# Patient Record
Sex: Male | Born: 1935 | Race: White | Hispanic: No | Smoking: Former smoker
Health system: Southern US, Community
[De-identification: ages and names within clinical notes are randomized; demographics above are authoritative.]

## PROBLEM LIST (undated history)

## (undated) DIAGNOSIS — C801 Malignant (primary) neoplasm, unspecified: Secondary | ICD-10-CM

## (undated) DIAGNOSIS — K635 Polyp of colon: Secondary | ICD-10-CM

## (undated) DIAGNOSIS — D126 Benign neoplasm of colon, unspecified: Secondary | ICD-10-CM

## (undated) DIAGNOSIS — R011 Cardiac murmur, unspecified: Secondary | ICD-10-CM

## (undated) DIAGNOSIS — T7840XA Allergy, unspecified, initial encounter: Secondary | ICD-10-CM

## (undated) DIAGNOSIS — F528 Other sexual dysfunction not due to a substance or known physiological condition: Secondary | ICD-10-CM

## (undated) DIAGNOSIS — G4730 Sleep apnea, unspecified: Secondary | ICD-10-CM

## (undated) DIAGNOSIS — E785 Hyperlipidemia, unspecified: Secondary | ICD-10-CM

## (undated) DIAGNOSIS — L719 Rosacea, unspecified: Secondary | ICD-10-CM

## (undated) DIAGNOSIS — I4892 Unspecified atrial flutter: Secondary | ICD-10-CM

## (undated) DIAGNOSIS — M1990 Unspecified osteoarthritis, unspecified site: Secondary | ICD-10-CM

## (undated) DIAGNOSIS — J309 Allergic rhinitis, unspecified: Secondary | ICD-10-CM

## (undated) DIAGNOSIS — I10 Essential (primary) hypertension: Secondary | ICD-10-CM

## (undated) HISTORY — DX: Cardiac murmur, unspecified: R01.1

## (undated) HISTORY — DX: Rosacea, unspecified: L71.9

## (undated) HISTORY — DX: Essential (primary) hypertension: I10

## (undated) HISTORY — DX: Allergy, unspecified, initial encounter: T78.40XA

## (undated) HISTORY — PX: COLONOSCOPY: SHX174

## (undated) HISTORY — DX: Polyp of colon: K63.5

## (undated) HISTORY — DX: Hyperlipidemia, unspecified: E78.5

## (undated) HISTORY — PX: PALATE SURGERY: SHX729

## (undated) HISTORY — PX: ADENOIDECTOMY: SUR15

## (undated) HISTORY — DX: Other sexual dysfunction not due to a substance or known physiological condition: F52.8

## (undated) HISTORY — DX: Benign neoplasm of colon, unspecified: D12.6

## (undated) HISTORY — PX: TONSILLECTOMY: SHX5217

## (undated) HISTORY — DX: Allergic rhinitis, unspecified: J30.9

## (undated) HISTORY — DX: Unspecified atrial flutter: I48.92

---

## 2000-06-01 DIAGNOSIS — D126 Benign neoplasm of colon, unspecified: Secondary | ICD-10-CM

## 2000-06-01 HISTORY — DX: Benign neoplasm of colon, unspecified: D12.6

## 2000-06-15 ENCOUNTER — Encounter (INDEPENDENT_AMBULATORY_CARE_PROVIDER_SITE_OTHER): Payer: Self-pay | Admitting: Specialist

## 2000-06-15 ENCOUNTER — Other Ambulatory Visit: Admission: RE | Admit: 2000-06-15 | Discharge: 2000-06-15 | Payer: Self-pay | Admitting: Gastroenterology

## 2004-05-19 ENCOUNTER — Ambulatory Visit: Payer: Self-pay | Admitting: Family Medicine

## 2004-08-11 ENCOUNTER — Ambulatory Visit: Payer: Self-pay | Admitting: Family Medicine

## 2004-08-19 ENCOUNTER — Ambulatory Visit: Payer: Self-pay | Admitting: Family Medicine

## 2005-06-01 ENCOUNTER — Ambulatory Visit: Payer: Self-pay | Admitting: Gastroenterology

## 2005-06-06 ENCOUNTER — Ambulatory Visit: Payer: Self-pay | Admitting: Cardiology

## 2005-06-12 ENCOUNTER — Ambulatory Visit: Payer: Self-pay | Admitting: Family Medicine

## 2005-06-15 ENCOUNTER — Ambulatory Visit: Payer: Self-pay | Admitting: Gastroenterology

## 2005-06-15 ENCOUNTER — Encounter (INDEPENDENT_AMBULATORY_CARE_PROVIDER_SITE_OTHER): Payer: Self-pay | Admitting: Specialist

## 2005-06-15 LAB — HM COLONOSCOPY

## 2005-08-21 ENCOUNTER — Ambulatory Visit: Payer: Self-pay | Admitting: Family Medicine

## 2005-08-28 ENCOUNTER — Ambulatory Visit: Payer: Self-pay | Admitting: Family Medicine

## 2006-04-26 IMAGING — CT CT CHEST W/O CM
2 of 5 series · 12 of 36 positions shown, 15 images · IV contrast (agent unspecified)
Comparison: none

CLINICAL DATA: Cough.  Quit smoking many years ago. 
 CHEST CT WITHOUT CONTRAST:
TECHNIQUE: Multidetector CT imaging of the chest was performed following the standard protocol without IV contrast.

[Series 2: chest_hi_res 5.0 b40f st · axial · 0.82mm/px · z∈[-356,-86]mm · 9 of 68 slices shown, 12 images]
[im 7/68  mediastinal]
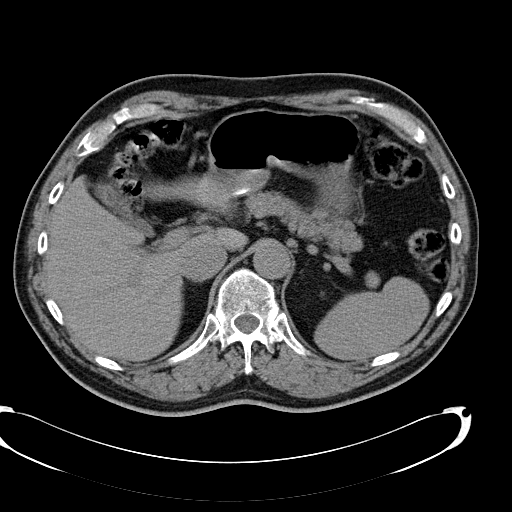
[im 7/68  lung]
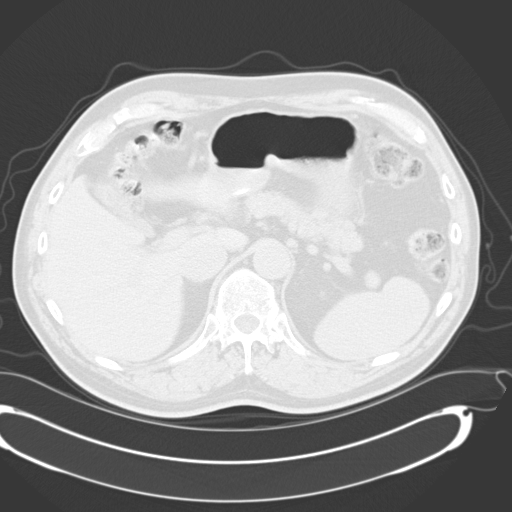
[im 14/68  lung]
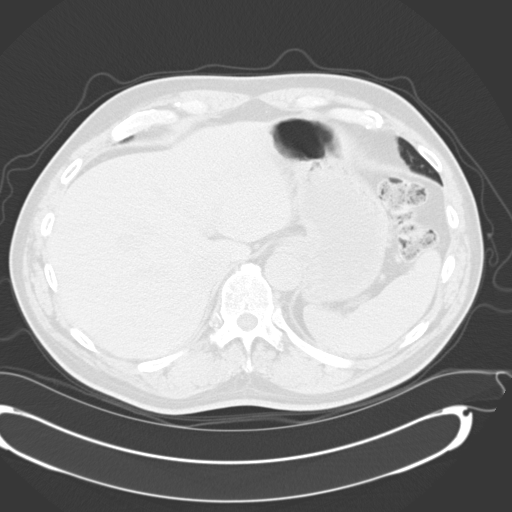
[im 21/68  lung]
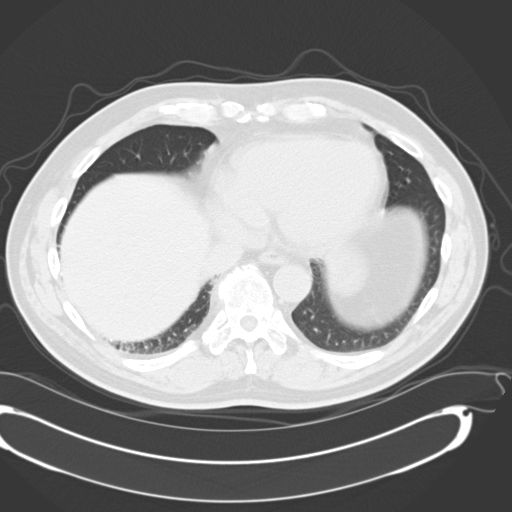
[im 27/68  lung]
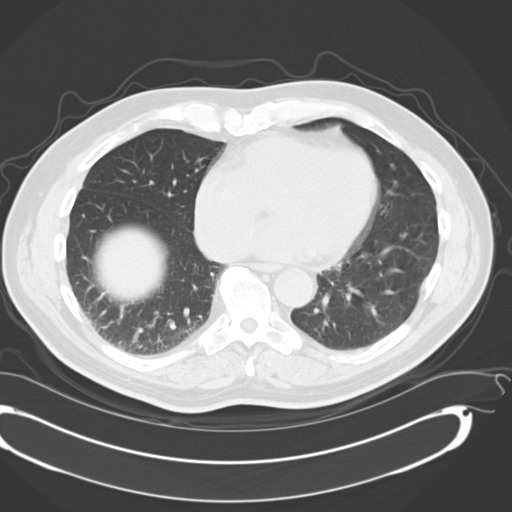
[im 34/68  mediastinal]
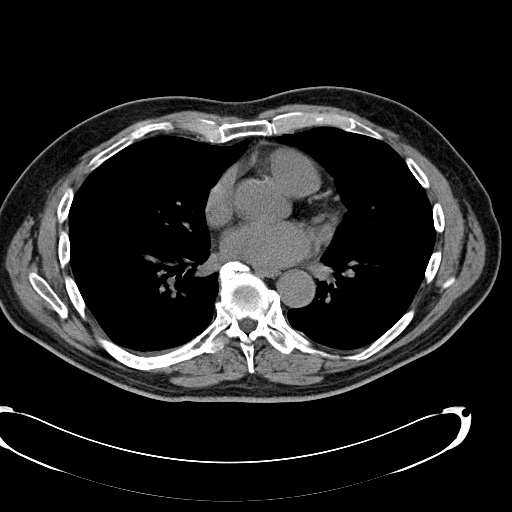
[im 34/68  lung]
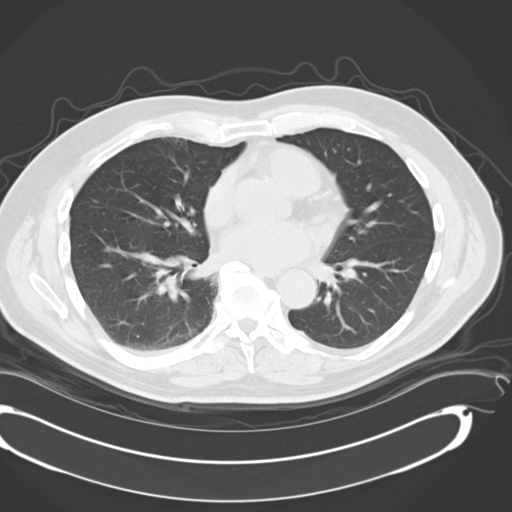
[im 41/68  lung]
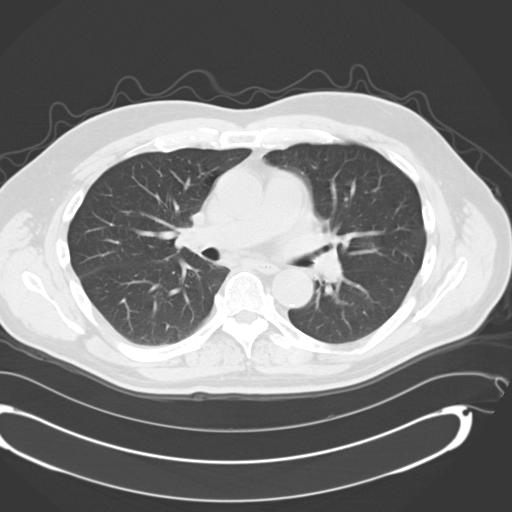
[im 47/68  lung]
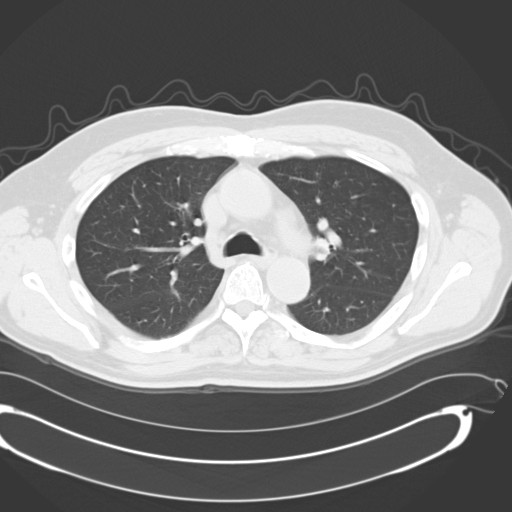
[im 54/68  lung]
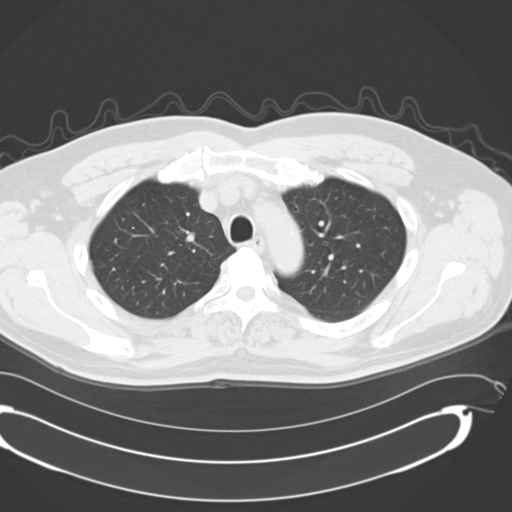
[im 61/68  mediastinal]
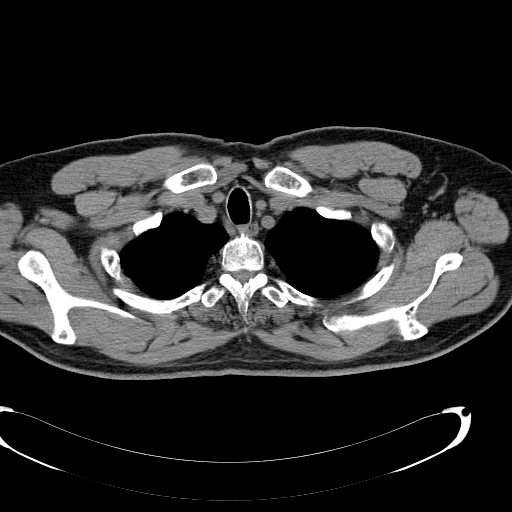
[im 61/68  lung]
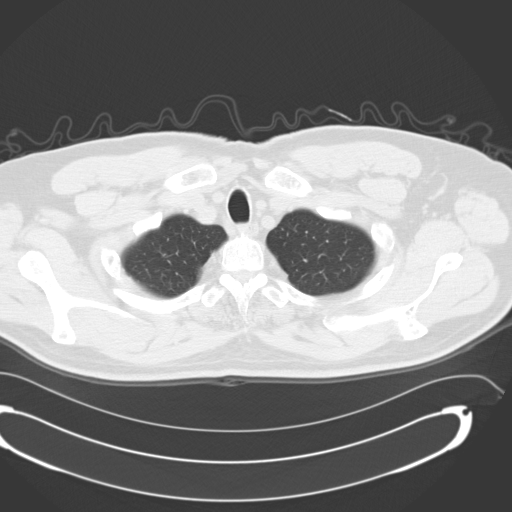

[Series 602: <mpr range> · coronal · 0.82mm/px · 3 of 39 slices shown]
[im 8/39  lung]
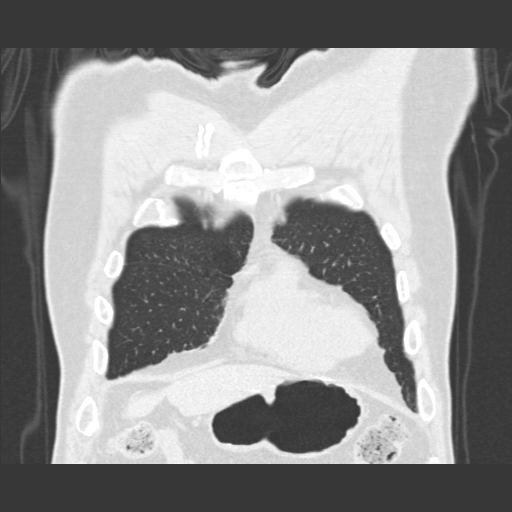
[im 16/39  lung]
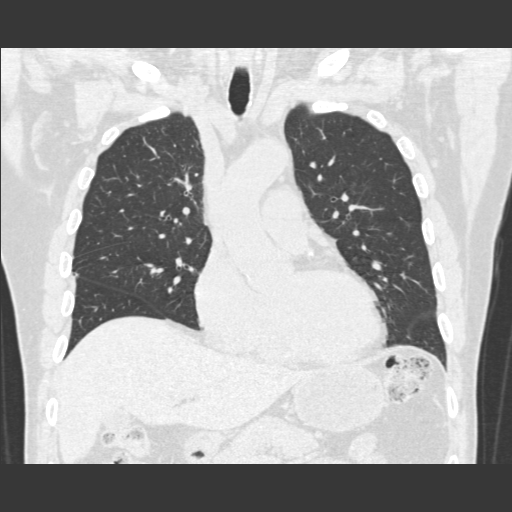
[im 23/39  lung]
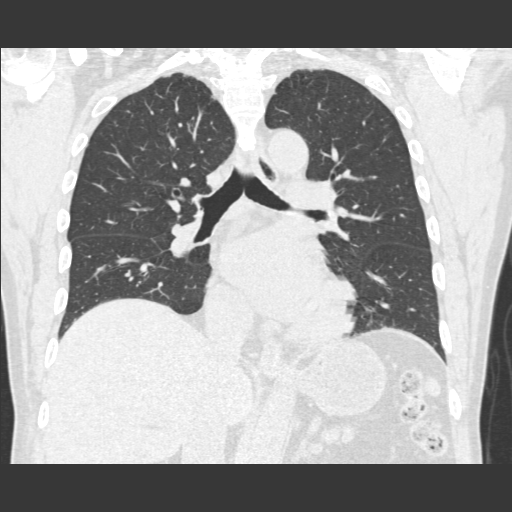

[12 of 36 positions shown; findings below may reference images not displayed]

FINDINGS: Multidetector helical scans through the chest were performed in the unenhanced state.  No lung parenchymal lesion is seen.  No effusion is noted.  There is no evidence of mediastinal or hilar adenopathy with only a few small nonenlarged mediastinal nodes present.  The pulmonary arteries and thoracic aorta are normal in the unenhanced state.  The heart is mildly enlarged.
IMPRESSION: 1.  Negative CT of the chest.  No lung nodule or adenopathy is seen. 
 2.  Minimal calcification posteriorly at the right lung base of questionable significance.

## 2006-09-04 ENCOUNTER — Ambulatory Visit: Payer: Self-pay | Admitting: Family Medicine

## 2006-09-04 LAB — CONVERTED CEMR LAB
ALT: 45 units/L — ABNORMAL HIGH (ref 0–40)
AST: 28 units/L (ref 0–37)
Albumin: 3.7 g/dL (ref 3.5–5.2)
BUN: 18 mg/dL (ref 6–23)
Creatinine, Ser: 0.9 mg/dL (ref 0.4–1.5)
Eosinophils Absolute: 0.3 10*3/uL (ref 0.0–0.6)
GFR calc non Af Amer: 89 mL/min
HCT: 46.8 % (ref 39.0–52.0)
HDL: 46.3 mg/dL (ref >39.0)
Hemoglobin: 16.2 g/dL (ref 13.0–17.0)
Hgb A1c MFr Bld: 5.5 % (ref 4.6–6.0)
LDL Cholesterol: 130 mg/dL — ABNORMAL HIGH (ref 0–99)
Lymphocytes Relative: 19.7 % (ref 12.0–46.0)
MCHC: 34.6 g/dL (ref 30.0–36.0)
Neutro Abs: 3.7 10*3/uL (ref 1.4–7.7)
Neutrophils Relative %: 61.1 % (ref 43.0–77.0)
PSA: 0.59 ng/mL (ref 0.10–4.00)
RBC: 4.97 M/uL (ref 4.22–5.81)
Sodium: 143 meq/L (ref 135–145)
Total CHOL/HDL Ratio: 4.2
Total Protein: 6.9 g/dL (ref 6.0–8.3)
Triglycerides: 99 mg/dL (ref 0–149)
VLDL: 20 mg/dL (ref 0–40)
WBC: 6.1 10*3/uL (ref 4.5–10.5)

## 2006-09-11 ENCOUNTER — Ambulatory Visit: Payer: Self-pay | Admitting: Family Medicine

## 2007-02-11 ENCOUNTER — Telehealth: Payer: Self-pay | Admitting: Family Medicine

## 2007-02-12 ENCOUNTER — Ambulatory Visit (HOSPITAL_BASED_OUTPATIENT_CLINIC_OR_DEPARTMENT_OTHER): Admission: RE | Admit: 2007-02-12 | Discharge: 2007-02-12 | Payer: Self-pay | Admitting: Otolaryngology

## 2007-09-18 ENCOUNTER — Ambulatory Visit: Payer: Self-pay | Admitting: Family Medicine

## 2007-09-18 LAB — CONVERTED CEMR LAB
ALT: 21 units/L (ref 0–53)
Albumin: 3.7 g/dL (ref 3.5–5.2)
Alkaline Phosphatase: 60 units/L (ref 39–117)
Basophils Relative: 0.4 % (ref 0.0–1.0)
Bilirubin, Direct: 0.1 mg/dL (ref 0.0–0.3)
Calcium: 9 mg/dL (ref 8.4–10.5)
GFR calc Af Amer: 123 mL/min
Hemoglobin: 15.3 g/dL (ref 13.0–17.0)
Ketones, urine, test strip: NEGATIVE
MCHC: 33.6 g/dL (ref 30.0–36.0)
MCV: 95.4 fL (ref 78.0–100.0)
Neutro Abs: 3 10*3/uL (ref 1.4–7.7)
PSA: 0.82 ng/mL (ref 0.10–4.00)
Potassium: 4.7 meq/L (ref 3.5–5.1)
RBC: 4.79 M/uL (ref 4.22–5.81)
Sodium: 143 meq/L (ref 135–145)
Total CHOL/HDL Ratio: 4.5
Urobilinogen, UA: 0.2
WBC Urine, dipstick: NEGATIVE

## 2007-10-03 ENCOUNTER — Ambulatory Visit: Payer: Self-pay | Admitting: Family Medicine

## 2007-10-03 DIAGNOSIS — F528 Other sexual dysfunction not due to a substance or known physiological condition: Secondary | ICD-10-CM | POA: Insufficient documentation

## 2007-10-03 DIAGNOSIS — E7800 Pure hypercholesterolemia, unspecified: Secondary | ICD-10-CM | POA: Insufficient documentation

## 2007-10-03 DIAGNOSIS — J309 Allergic rhinitis, unspecified: Secondary | ICD-10-CM | POA: Insufficient documentation

## 2007-10-03 DIAGNOSIS — E785 Hyperlipidemia, unspecified: Secondary | ICD-10-CM

## 2007-10-03 DIAGNOSIS — R011 Cardiac murmur, unspecified: Secondary | ICD-10-CM | POA: Insufficient documentation

## 2007-10-03 HISTORY — DX: Cardiac murmur, unspecified: R01.1

## 2007-10-03 HISTORY — DX: Hyperlipidemia, unspecified: E78.5

## 2007-10-03 HISTORY — DX: Allergic rhinitis, unspecified: J30.9

## 2007-10-03 HISTORY — DX: Other sexual dysfunction not due to a substance or known physiological condition: F52.8

## 2007-10-14 ENCOUNTER — Ambulatory Visit: Payer: Self-pay

## 2007-10-14 ENCOUNTER — Encounter: Payer: Self-pay | Admitting: Family Medicine

## 2007-10-23 ENCOUNTER — Ambulatory Visit: Payer: Self-pay | Admitting: Family Medicine

## 2008-10-08 ENCOUNTER — Ambulatory Visit: Payer: Self-pay | Admitting: Family Medicine

## 2009-03-15 ENCOUNTER — Telehealth: Payer: Self-pay | Admitting: Family Medicine

## 2009-05-03 ENCOUNTER — Ambulatory Visit: Payer: Self-pay | Admitting: Family Medicine

## 2009-05-03 DIAGNOSIS — R002 Palpitations: Secondary | ICD-10-CM | POA: Insufficient documentation

## 2009-05-05 ENCOUNTER — Telehealth: Payer: Self-pay | Admitting: Family Medicine

## 2009-08-25 ENCOUNTER — Ambulatory Visit: Payer: Self-pay | Admitting: Family Medicine

## 2009-08-25 DIAGNOSIS — J45901 Unspecified asthma with (acute) exacerbation: Secondary | ICD-10-CM | POA: Insufficient documentation

## 2009-10-19 ENCOUNTER — Ambulatory Visit: Payer: Self-pay | Admitting: Family Medicine

## 2009-10-19 DIAGNOSIS — R6882 Decreased libido: Secondary | ICD-10-CM | POA: Insufficient documentation

## 2010-05-11 ENCOUNTER — Telehealth: Payer: Self-pay | Admitting: Family Medicine

## 2010-05-29 LAB — CONVERTED CEMR LAB
ALT: 25 units/L (ref 0–53)
ALT: 34 units/L (ref 0–53)
Albumin: 3.9 g/dL (ref 3.5–5.2)
Alkaline Phosphatase: 56 units/L (ref 39–117)
BUN: 18 mg/dL (ref 6–23)
Basophils Relative: 0.5 % (ref 0.0–3.0)
Bilirubin, Direct: 0.2 mg/dL (ref 0.0–0.3)
Calcium: 9 mg/dL (ref 8.4–10.5)
Cholesterol: 196 mg/dL (ref 0–200)
Cholesterol: 212 mg/dL — ABNORMAL HIGH (ref 0–200)
Creatinine, Ser: 0.9 mg/dL (ref 0.4–1.5)
Creatinine, Ser: 0.9 mg/dL (ref 0.4–1.5)
Direct LDL: 148.7 mg/dL
Eosinophils Relative: 4.4 % (ref 0.0–5.0)
GFR calc non Af Amer: 88.52 mL/min (ref >60)
GFR calc non Af Amer: 93.02 mL/min (ref >60)
Glucose, Bld: 111 mg/dL — ABNORMAL HIGH (ref 70–99)
Glucose, Urine, Semiquant: NEGATIVE
HCT: 43.7 % (ref 39.0–52.0)
HDL: 52.1 mg/dL (ref >39.00)
Hemoglobin: 15.6 g/dL (ref 13.0–17.0)
Ketones, urine, test strip: NEGATIVE
Lymphs Abs: 1 10*3/uL (ref 0.7–4.0)
Lymphs Abs: 1.3 10*3/uL (ref 0.7–4.0)
MCHC: 34.5 g/dL (ref 30.0–36.0)
MCV: 95.4 fL (ref 78.0–100.0)
MCV: 95.8 fL (ref 78.0–100.0)
Monocytes Absolute: 0.6 10*3/uL (ref 0.1–1.0)
Monocytes Absolute: 0.6 10*3/uL (ref 0.1–1.0)
Neutro Abs: 2.6 10*3/uL (ref 1.4–7.7)
Nitrite: NEGATIVE
PSA: 0.71 ng/mL (ref 0.10–4.00)
RDW: 14.2 % (ref 11.5–14.6)
Sodium: 142 meq/L (ref 135–145)
Specific Gravity, Urine: 1.02
Specific Gravity, Urine: 1.025
TSH: 1.57 microintl units/mL (ref 0.35–5.50)
Total Bilirubin: 1 mg/dL (ref 0.3–1.2)
Total CHOL/HDL Ratio: 4
Total Protein: 6.8 g/dL (ref 6.0–8.3)
Total Protein: 7.1 g/dL (ref 6.0–8.3)
Triglycerides: 118 mg/dL (ref 0.0–149.0)
Urobilinogen, UA: 0.2
VLDL: 13.4 mg/dL (ref 0.0–40.0)
WBC Urine, dipstick: NEGATIVE
pH: 7

## 2010-05-31 NOTE — Assessment & Plan Note (Signed)
Summary: emp---will fast//ccm   Vital Signs:  Patient profile:   75 year old male Height:      75.25 inches Weight:      240 pounds Temp:     98.2 degrees F oral BP sitting:   120 / 74  (left arm) Cuff size:   regular  Vitals Entered By: Kern Reap CMA Duncan Dull) (October 19, 2009 9:34 AM) CC: cpx   CC:  cpx.  History of Present Illness: Gary [name] is a 75 year old, married male, nonsmoker, who comes in today for evaluation of hyperlipidemia, allergic rhinitis erectile dysfunction, and a new problem of decreased libido.  His hyperlipidemia is treated with simvastatin 40 mg daily will check lipid panel today.  Erectile dysfunction is treated with Viagra 100 mg p.r.n.  Allergic rhinitis is treated with Nasonex nasal spray p.r.n.  He sells the nose recently decreased libido.  No change in his anatomy.  He gets routine eye care.  Dental care.  Colonoscopy initial one showed a polyp x 2.  Subsequent colonoscopy normal.  Tetanus 2006 Pneumovax 2006  Allergies: No Known Drug Allergies  Past History:  Past medical, surgical, family and social histories (including risk factors) reviewed, and no changes noted (except as noted below).  Past Medical History: Reviewed history from 10/23/2007 and no changes required. Allergic rhinitis Hyperlipidemia acne rosacea pilonidal cyst tonsillectomy aortic insufficiency  Family History: Reviewed history from 10/03/2007 and no changes required. father died of liver cancer.  He previously had an MRI and was an alcoholic  mother died at 66 old age.  Diabetes, type IIone brother one sister in good health  Social History: Reviewed history from 10/03/2007 and no changes required. Retired Married Former Smoker Alcohol use-yes Drug use-no Regular exercise-yes  Review of Systems      See HPI  Physical Exam  General:  Well-developed,well-nourished,in no acute distress; alert,appropriate and cooperative throughout examination Head:   Normocephalic and atraumatic without obvious abnormalities. No apparent alopecia or balding. Eyes:  No corneal or conjunctival inflammation noted. EOMI. Perrla. Funduscopic exam benign, without hemorrhages, exudates or papilledema. Vision grossly normal. Ears:  External ear exam shows no significant lesions or deformities.  Otoscopic examination reveals clear canals, tympanic membranes are intact bilaterally without bulging, retraction, inflammation or discharge. Hearing is grossly normal bilaterally. Nose:  External nasal examination shows no deformity or inflammation. Nasal mucosa are pink and moist without lesions or exudates. Mouth:  Oral mucosa and oropharynx without lesions or exudates.  Teeth in good repair. Neck:  No deformities, masses, or tenderness noted. Chest Wall:  No deformities, masses, tenderness or gynecomastia noted. Breasts:  No masses or gynecomastia noted Lungs:  Normal respiratory effort, chest expands symmetrically. Lungs are clear to auscultation, no crackles or wheezes. Heart:  Normal rate and regular rhythm. S1 and S2 normal without gallop, murmur, click, rub or other extra sounds. Abdomen:  Bowel sounds positive,abdomen soft and non-tender without masses, organomegaly or hernias noted. Rectal:  No external abnormalities noted. Normal sphincter tone. No rectal masses or tenderness. Genitalia:  Testes bilaterally descended without nodularity, tenderness or masses. No scrotal masses or lesions. No penis lesions or urethral discharge. Prostate:  Prostate gland firm and smooth, no enlargement, nodularity, tenderness, mass, asymmetry or induration. Msk:  No deformity or scoliosis noted of thoracic or lumbar spine.   Pulses:  R and L carotid,radial,femoral,dorsalis pedis and posterior tibial pulses are full and equal bilaterally Extremities:  No clubbing, cyanosis, edema, or deformity noted with normal full range of motion of all  joints.   Neurologic:  No cranial nerve deficits  noted. Station and gait are normal. Plantar reflexes are down-going bilaterally. DTRs are symmetrical throughout. Sensory, motor and coordinative functions appear intact. Skin:  Intact without suspicious lesions or rashes Cervical Nodes:  No lymphadenopathy noted Axillary Nodes:  No palpable lymphadenopathy Inguinal Nodes:  No significant adenopathy Psych:  Cognition and judgment appear intact. Alert and cooperative with normal attention span and concentration. No apparent delusions, illusions, hallucinations   Impression & Recommendations:  Problem # 1:  HYPERLIPIDEMIA (ICD-272.4) Assessment Improved  His updated medication list for this problem includes:    Simvastatin 40 Mg Tabs (Simvastatin) .[name]... 1 at bedtime  Orders: Venipuncture ([address]) TLB-Lipid Panel (80061-LIPID) TLB-BMP (Basic Metabolic Panel-BMET) (80048-METABOL) TLB-CBC Platelet - w/Differential (85025-CBCD) TLB-Hepatic/Liver Function Pnl (80076-HEPATIC) TLB-TSH (Thyroid Stimulating Hormone) (84443-TSH) TLB-PSA (Prostate Specific Antigen) (84153-PSA) TLB-Testosterone, Total (84403-TESTO) UA Dipstick w/o Micro (automated)  (81003) EKG w/ Interpretation (93000)  Problem # 2:  CARDIAC MURMUR, AORTIC (ICD-785.2) Assessment: Unchanged  Orders: Venipuncture ([address]) TLB-Lipid Panel (80061-LIPID) TLB-BMP (Basic Metabolic Panel-BMET) (80048-METABOL) TLB-CBC Platelet - w/Differential (85025-CBCD) TLB-Hepatic/Liver Function Pnl (80076-HEPATIC) TLB-TSH (Thyroid Stimulating Hormone) (84443-TSH) TLB-PSA (Prostate Specific Antigen) (84153-PSA) TLB-Testosterone, Total (84403-TESTO) UA Dipstick w/o Micro (automated)  (81003) EKG w/ Interpretation (93000)  Problem # 3:  ALLERGIC RHINITIS (ICD-477.9) Assessment: Improved  His updated medication list for this problem includes:    Nasonex 50 Mcg/act Susp (Mometasone furoate) ..... Spray 2 spray as directed once a day  Problem # 4:  LIBIDO, DECREASED (ICD-799.81) Assessment:  New  Orders: Venipuncture (09811) TLB-Lipid Panel (80061-LIPID) TLB-BMP (Basic Metabolic Panel-BMET) (80048-METABOL) TLB-CBC Platelet - w/Differential (85025-CBCD) TLB-Hepatic/Liver Function Pnl (80076-HEPATIC) TLB-TSH (Thyroid Stimulating Hormone) (84443-TSH) TLB-PSA (Prostate Specific Antigen) (84153-PSA) TLB-Testosterone, Total (84403-TESTO) UA Dipstick w/o Micro (automated)  (81003)  Complete Medication List: 1)  Simvastatin 40 Mg Tabs (Simvastatin) .[name].. 1 at bedtime 2)  Nasonex 50 Mcg/act Susp (Mometasone furoate) .... Spray 2 spray as directed once a day 3)  Adult Aspirin Ec Low Strength 81 Mg Tbec (Aspirin) .... Once daily 4)  Viagra 100 Mg Tabs (Sildenafil citrate) .... Uad 5)  Prednisone 20 Mg Tabs (Prednisone) .... Uad  Other Orders: Pneumococcal Vaccine (91478) Admin 1st Vaccine (29562)  Patient Instructions: 1)  I will call you I get her lab work back 2)  Please schedule a follow-up appointment in 1 year. Prescriptions: VIAGRA 100 MG TABS (SILDENAFIL CITRATE) UAD  #6.0 Each x 10   Entered and Authorized by:   Roderick Pee MD   Signed by:   Roderick Pee MD on 10/19/2009   Method used:   Electronically to        [id number]* (retail)       [address]       [place], [place]  [address]       Ph: [phone]       Fax: [phone]   RxID:   [id number] NASONEX 50 MCG/ACT SUSP (MOMETASONE FUROATE) Spray 2 spray as directed once a day  #3 x 4   Entered and Authorized by:   Roderick Pee MD   Signed by:   Roderick Pee MD on 10/19/2009   Method used:   Electronically to        [facility] #[id number]* (retail)       [address]       [place]  [place], [place]  [address]       Ph: [phone]       Fax: [phone]   RxID:   [id number] SIMVASTATIN 40 MG  TABS (SIMVASTATIN) 1 at bedtime  #100 x 3   Entered and Authorized by:   Roderick Pee MD   Signed by:    Roderick Pee MD on 10/19/2009   Method used:   Electronically to        [id number]* (retail)       [address]       [place], [place]  [address]       Ph: [phone]       Fax: [phone]   RxID:   [id number]    Immunizations Administered:  Pneumonia Vaccine:    Vaccine Type: Pneumovax    Site: left deltoid    Mfr: Merck    Dose: 0.5 ml    Route: IM    Given by: Kern Reap CMA (AAMA)    Exp. Date: 02/23/2011    Lot #: [id number]    Laboratory Results   Urine Tests    Routine Urinalysis   Color: yellow Appearance: Clear Glucose: negative   (Normal Range: Negative) Bilirubin: negative   (Normal Range: Negative) Ketone: negative   (Normal Range: Negative) Spec. Gravity: 1.025   (Normal Range: 1.003-1.035) Blood: negative   (Normal Range: Negative) pH: 7.0   (Normal Range: 5.0-8.0) Protein: 2+   (Normal Range: Negative) Urobilinogen: 0.2   (Normal Range: 0-1) Nitrite: negative   (Normal Range: Negative) Leukocyte Esterace: negative   (Normal Range: Negative)    Comments: Rita Ohara  October 19, 2009 12:02 PM

## 2010-05-31 NOTE — Progress Notes (Signed)
Summary: refill  Phone Note Refill Request   Refills Requested: Medication #1:  SIMVASTATIN 40 MG  TABS 1 at bedtime Initial call taken by: Kern Reap CMA (AAMA),  May 05, 2009 11:46 AM    Prescriptions: SIMVASTATIN 40 MG  TABS (SIMVASTATIN) 1 at bedtime  #100 x 3   Entered by:   Kern Reap CMA (AAMA)   Authorized by:   Roderick Pee MD   Signed by:   Kern Reap CMA (AAMA) on 05/05/2009   Method used:   Faxed to ...       [facility] (mail-order)       [address]       [place], [place]  [id number]       Ph: [phone]       Fax: [phone]   RxID:   [id number] SIMVASTATIN 40 MG  TABS (SIMVASTATIN) 1 at bedtime  #100 x 3   Entered by:   Kern Reap CMA (AAMA)   Authorized by:   Roderick Pee MD   Signed by:   Kern Reap CMA (AAMA) on 05/05/2009   Method used:   Print then Give to Patient   RxID:   [id number]

## 2010-05-31 NOTE — Assessment & Plan Note (Signed)
Summary: fu on echo/njr   Vital Signs:  Patient profile:   75 year old male Weight:      251 pounds Temp:     97.5 degrees F oral BP sitting:   130 / 90  (left arm) Cuff size:   regular  Vitals Entered By: Kern Reap CMA Duncan Dull) (May 03, 2009 11:14 AM)  Reason for Visit fluttering in chest  History of Present Illness: Gary [name] is a 75 year old, married male, nonsmoker, who comes in today for evaluation of skipping heart rate.  He was last seen in June of 2010.  At that time, and his physical last spring, we found a new heart murmur.  The echo showed a minor AI.  Since that, time.  He's had no problems.  He exercises with a personal trainer twice a week in 3 times a week.  This 45 minutes on a treadmill at 4 mph.  He has no chest pain, shortness of breath, or change in exercise tolerance.  About two to 3 weeks ago he noticed his heart was skipping.  It's unrelated to exercise.  Five days ago.  He went on a caffeine free diet and since that time.  The palpitations have stopped.  Allergies: No Known Drug Allergies  Past History:  Past medical, surgical, family and social histories (including risk factors) reviewed for relevance to current acute and chronic problems.  Past Medical History: Reviewed history from 10/23/2007 and no changes required. Allergic rhinitis Hyperlipidemia acne rosacea pilonidal cyst tonsillectomy aortic insufficiency  Family History: Reviewed history from 10/03/2007 and no changes required. father died of liver cancer.  He previously had an MRI and was an alcoholic  mother died at 23 old age.  Diabetes, type IIone brother one sister in good health  Social History: Reviewed history from 10/03/2007 and no changes required. Retired Married Former Smoker Alcohol use-yes Drug use-no Regular exercise-yes  Review of Systems      See HPI  Physical Exam  General:  Well-developed,well-nourished,in no acute distress; alert,appropriate and cooperative  throughout examination Neck:  No deformities, masses, or tenderness noted. Lungs:  Normal respiratory effort, chest expands symmetrically. Lungs are clear to auscultation, no crackles or wheezes. Heart:  murmur unchanged.  Mild aortic insufficiency, pulse 70 and regular   Impression & Recommendations:  Problem # 1:  CARDIAC MURMUR, AORTIC (ICD-785.2) Assessment Unchanged  Problem # 2:  PALPITATIONS, OCCASIONAL (ICD-785.1) Assessment: New  Orders: EKG w/ Interpretation (93[address])  Complete Medication List: 1)  Simvastatin 40 Mg Tabs (Simvastatin) .[name].. 1 at bedtime 2)  Nasonex 50 Mcg/act Susp (Mometasone furoate) .... Spray 2 spray as directed once a day 3)  Adult Aspirin Ec Low Strength 81 Mg Tbec (Aspirin) .... Once daily 4)  Viagra 100 Mg Tabs (Sildenafil citrate) .... Uad  Patient Instructions: 1)  continue to avoid caffeine return for your annual examination, sooner if any problems  Appended Document: fu on echo/njr     Allergies: No Known Drug Allergies   Complete Medication List: 1)  Simvastatin 40 Mg Tabs (Simvastatin) .[name].. 1 at bedtime 2)  Nasonex 50 Mcg/act Susp (Mometasone furoate) .... Spray 2 spray as directed once a day 3)  Adult Aspirin Ec Low Strength 81 Mg Tbec (Aspirin) .... Once daily 4)  Viagra 100 Mg Tabs (Sildenafil citrate) .... Uad  Other Orders: EKG w/ Interpretation (93[address])

## 2010-05-31 NOTE — Assessment & Plan Note (Signed)
Summary: severe cough with congestion/?bronchitus/cjr   Vital Signs:  Patient profile:   75 year old male Weight:      238 pounds BMI:     29.86 Temp:     98.1 degrees F oral BP sitting:   120 / 70  (left arm) Cuff size:   regular  Vitals Entered By: Kern Reap CMA Duncan Dull) (August 25, 2009 9:53 AM) CC: head and chest congestion   CC:  head and chest congestion.  History of Present Illness: Gary [name] is a 75 year old, married male, nonsmoker, who comes in today with a week's history of allergic rhinitis and now wheezing.  He states he has a history of seasonal allergic rhinitis, but typically it sneezing, runny nose, head congestion, et Karie Soda.  This year, he began the above symptoms.  However, 3 or 4 days ago, he started having cough and wheezing.  No fever no sputum production  Allergies: No Known Drug Allergies  Past History:  Past medical, surgical, family and social histories (including risk factors) reviewed for relevance to current acute and chronic problems.  Past Medical History: Reviewed history from 10/23/2007 and no changes required. Allergic rhinitis Hyperlipidemia acne rosacea pilonidal cyst tonsillectomy aortic insufficiency  Family History: Reviewed history from 10/03/2007 and no changes required. father died of liver cancer.  He previously had an MRI and was an alcoholic  mother died at 75 old age.  Diabetes, type IIone brother one sister in good health  Social History: Reviewed history from 10/03/2007 and no changes required. Retired Married Former Smoker Alcohol use-yes Drug use-no Regular exercise-yes  Review of Systems      See HPI  Physical Exam  General:  Well-developed,well-nourished,in no acute distress; alert,appropriate and cooperative throughout examination Head:  Normocephalic and atraumatic without obvious abnormalities. No apparent alopecia or balding. Eyes:  No corneal or conjunctival inflammation noted. EOMI. Perrla. Funduscopic  exam benign, without hemorrhages, exudates or papilledema. Vision grossly normal. Ears:  External ear exam shows no significant lesions or deformities.  Otoscopic examination reveals clear canals, tympanic membranes are intact bilaterally without bulging, retraction, inflammation or discharge. Hearing is grossly normal bilaterally. Nose:  External nasal examination shows no deformity or inflammation. Nasal mucosa are pink and moist without lesions or exudates. Mouth:  Oral mucosa and oropharynx without lesions or exudates.  Teeth in good repair. Neck:  No deformities, masses, or tenderness noted. Chest Wall:  No deformities, masses, tenderness or gynecomastia noted. Breasts:  No masses or gynecomastia noted Lungs:  symmetrical breath sounds bilateral wheezing   Problems:  Medical Problems Added: 1)  Dx of Asthma, Acute  (ZOX-096.04)  Impression & Recommendations:  Problem # 1:  ASTHMA, ACUTE (VWU-981.19) Assessment New  His updated medication list for this problem includes:    Prednisone 20 Mg Tabs (Prednisone) ..... Uad  Orders: Prescription Created Electronically [phone])  Complete Medication List: 1)  Simvastatin 40 Mg Tabs (Simvastatin) .[name].. 1 at bedtime 2)  Nasonex 50 Mcg/act Susp (Mometasone furoate) .... Spray 2 spray as directed once a day 3)  Adult Aspirin Ec Low Strength 81 Mg Tbec (Aspirin) .... Once daily 4)  Viagra 100 Mg Tabs (Sildenafil citrate) .... Uad 5)  Prednisone 20 Mg Tabs (Prednisone) .... Uad  Patient Instructions: 1)  begin prednisone two tabs x 3 days, one x 3 days, a half x 3 days, then a half a tablet Monday, Wednesday, Friday, for a two week taper. 2)  While on the prednisone to stay on a salt free diet 3)  Please schedule a follow-up appointment as needed. Prescriptions: PREDNISONE 20 MG TABS (PREDNISONE) UAD  #40 x 1   Entered and Authorized by:   Roderick Pee MD   Signed by:   Roderick Pee MD on 08/25/2009   Method used:   Electronically to          [id number]* (retail)       [address]       [place], [place]  [address]       Ph: [phone]       Fax: [phone]   RxID:   [id number]

## 2010-06-02 NOTE — Progress Notes (Signed)
Summary: refill request  Phone Note Refill Request Message from:  Fax from Pharmacy on May 11, 2010 4:00 PM  Refills Requested: Medication #1:  SIMVASTATIN 40 MG  TABS 1 at bedtime Initial call taken by: Kern Reap CMA (AAMA),  May 11, 2010 4:00 PM    Prescriptions: SIMVASTATIN 40 MG  TABS (SIMVASTATIN) 1 at bedtime  #100 x 2   Entered by:   Kern Reap CMA (AAMA)   Authorized by:   Roderick Pee MD   Signed by:   Kern Reap CMA (AAMA) on 05/11/2010   Method used:   Faxed to ...       [facility] (mail-order)       [address]       [place], [place]  [id number]       Ph: [phone]       Fax: [phone]   RxID:   [id number]

## 2010-06-03 ENCOUNTER — Other Ambulatory Visit: Payer: Self-pay | Admitting: Dermatology

## 2010-07-13 ENCOUNTER — Encounter: Payer: Self-pay | Admitting: Gastroenterology

## 2010-07-19 NOTE — Letter (Signed)
Summary: Colonoscopy Letter  [facility]  [address] [place], [place] [address]   Phone: [phone]  Fax: [phone]      July 13, 2010 MRN: [id number]   Gary [name] [address] [place], [place]  [address]   Dear Mr. Steil,   According to your medical record, it is time for you to schedule a Colonoscopy. The American Cancer Society recommends this procedure as a method to detect early colon cancer. Patients with a family history of colon cancer, or a personal history of colon polyps or inflammatory bowel disease are at increased risk.  This letter has been generated based on the recommendations made at the time of your procedure. If you feel that in your particular situation this may no longer apply, please contact our office.  Please call our office at [phone] to schedule this appointment or to update your records at your earliest convenience.  Thank you for cooperating with [place] to provide you with the very best care possible.   Sincerely,  Judie Petit T. Russella Dar, M.D.  [facility] [phone]

## 2010-09-13 NOTE — Op Note (Signed)
NAMESWANSON, FARNELL               ACCOUNT NO.:  [id number]   MEDICAL RECORD NO.:  [id number]          PATIENT TYPE:  AMB   LOCATION:  [facility]                          FACILITY:  [facility]   PHYSICIAN:  Christopher E. Ezzard Standing, M.D.DATE OF BIRTH:  [birth year]   DATE OF PROCEDURE:  02/12/2007  DATE OF DISCHARGE:                               OPERATIVE REPORT   PREOPERATIVE DIAGNOSES:  1. Nasal obstruction.  2. Snoring.   POSTOPERATIVE DIAGNOSES:  1. Nasal obstruction.  2. Snoring.   OPERATION:  Bilateral inferior turbinate reduction.  Uvulectomy.   SURGEON:  Dr. Narda Bonds.   ANESTHESIA:  General endotracheal.   COMPLICATIONS:  None.   CLINICAL NOTE:  [name] is a 75 year old gentleman who has had  longstanding snoring that has gradually gotten worse.  He has  intermittent nasal obstruction when he lies down at night which seems to  make the snoring worse.  On exam, he is status post tonsillectomy, has a  moderately large uvula, soft palate region. Nasal exam reveals  moderate  turbinate hypertrophy with minimal septal deformity.  He is taken to the  operating room at this time for turbinate reduction to help improve his  nasal airway and uvulectomy help improve his snoring.   DESCRIPTION OF PROCEDURE:  After adequate endotracheal anesthesia, Lou  received 1 gram of Ancef IV preoperatively as well as 10 mg of Decadron.  The nose was examined first.  The turbinates were injected with  Xylocaine with epinephrine for hemostasis.  On the right side [name] had  mostly just hypertrophy of the mucosa with minimal bone.  Submucosal  bicauterization with the Elmed cautery was performed.  On the left side,  [name] had more turbinate tissue and a little bit more bone in addition to  a large spur off the septum posteriorly on the left side.  The inferior  1/3 of the turbinate was amputated with scissors and then suction  cautery was used to cauterize the edge of the turbinate tissue.  The  remaining turbinate tissue was outfractured.  The spur in the posterior  portion of the left nasal passageway was removed and cut with  __________  biting forceps.  Then suction cautery was used to cauterize  the edge of the mucosa.  This completed the turbinate reductions.  There  were no polyps and no other intranasal lesions noted.  Following this,  the patient was turned and a mouth gag was used to expose the  oropharynx.  The patient was status post tonsillectomy and had a  moderate amount of soft tissue of the soft palate and uvula. The uvula  was transected at its base with the cautery and then about 1/2 to 1 cm  cuts were made up into the soft palate on either side of the base of the  uvula.  This completed the procedure and there was substantial bleeding.  [name] was awoke from anesthesia and transferred to the recovery room  postop doing well.   DISPOSITION:  [name] was discharged home later this morning on Vicodin and  Lortab p.r.n. pain, amoxicillin 500 mg b.i.d. for  1 week, saline nasal  rinses for his nose and will have him follow-up in 2 weeks for recheck.           ______________________________  Kristine Garbe. Ezzard Standing, M.D.     CEN/MEDQ  D:  02/12/2007  T:  02/12/2007  Job:  [id number]

## 2010-12-19 ENCOUNTER — Encounter: Payer: Self-pay | Admitting: Family Medicine

## 2010-12-20 ENCOUNTER — Encounter: Payer: Self-pay | Admitting: Family Medicine

## 2010-12-20 ENCOUNTER — Other Ambulatory Visit: Payer: Self-pay | Admitting: Family Medicine

## 2010-12-20 ENCOUNTER — Ambulatory Visit (INDEPENDENT_AMBULATORY_CARE_PROVIDER_SITE_OTHER): Payer: Medicare Other | Admitting: Family Medicine

## 2010-12-20 DIAGNOSIS — Z0000 Encounter for general adult medical examination without abnormal findings: Secondary | ICD-10-CM

## 2010-12-20 DIAGNOSIS — E785 Hyperlipidemia, unspecified: Secondary | ICD-10-CM

## 2010-12-20 DIAGNOSIS — J309 Allergic rhinitis, unspecified: Secondary | ICD-10-CM

## 2010-12-20 DIAGNOSIS — F528 Other sexual dysfunction not due to a substance or known physiological condition: Secondary | ICD-10-CM

## 2010-12-20 DIAGNOSIS — R6882 Decreased libido: Secondary | ICD-10-CM

## 2010-12-20 DIAGNOSIS — I350 Nonrheumatic aortic (valve) stenosis: Secondary | ICD-10-CM | POA: Insufficient documentation

## 2010-12-20 DIAGNOSIS — R011 Cardiac murmur, unspecified: Secondary | ICD-10-CM

## 2010-12-20 LAB — CBC WITH DIFFERENTIAL/PLATELET
Basophils Relative: 0.4 % (ref 0.0–3.0)
Eosinophils Absolute: 0.2 10*3/uL (ref 0.0–0.7)
Eosinophils Relative: 2.9 % (ref 0.0–5.0)
HCT: 46.6 % (ref 39.0–52.0)
Hemoglobin: 16.1 g/dL (ref 13.0–17.0)
Lymphs Abs: 1.5 10*3/uL (ref 0.7–4.0)
MCHC: 34.4 g/dL (ref 30.0–36.0)
MCV: 95.9 fl (ref 78.0–100.0)
Monocytes Absolute: 0.7 10*3/uL (ref 0.1–1.0)
Neutro Abs: 4 10*3/uL (ref 1.4–7.7)
RBC: 4.86 Mil/uL (ref 4.22–5.81)
WBC: 6.3 10*3/uL (ref 4.5–10.5)

## 2010-12-20 LAB — POCT URINALYSIS DIPSTICK
Bilirubin, UA: NEGATIVE
Ketones, UA: NEGATIVE
Leukocytes, UA: NEGATIVE
Spec Grav, UA: 1.02
pH, UA: 7.5

## 2010-12-20 LAB — HEPATIC FUNCTION PANEL
ALT: 34 U/L (ref 0–53)
Albumin: 4 g/dL (ref 3.5–5.2)
Bilirubin, Direct: 0.2 mg/dL (ref 0.0–0.3)
Total Protein: 6.9 g/dL (ref 6.0–8.3)

## 2010-12-20 LAB — TSH: TSH: 2.38 u[IU]/mL (ref 0.35–5.50)

## 2010-12-20 LAB — LIPID PANEL
Cholesterol: 188 mg/dL (ref 0–200)
Triglycerides: 250 mg/dL — ABNORMAL HIGH (ref 0.0–149.0)

## 2010-12-20 LAB — BASIC METABOLIC PANEL
CO2: 31 mEq/L (ref 19–32)
Chloride: 104 mEq/L (ref 96–112)
Potassium: 4.6 mEq/L (ref 3.5–5.1)

## 2010-12-20 LAB — PSA: PSA: 0.98 ng/mL (ref 0.10–4.00)

## 2010-12-20 MED ORDER — SIMVASTATIN 40 MG PO TABS: 40.0000 mg | ORAL_TABLET | Freq: Every day | ORAL | Status: DC

## 2010-12-20 MED ORDER — MOMETASONE FUROATE 50 MCG/ACT NA SUSP: 2.0000 | Freq: Every day | NASAL | Status: DC

## 2010-12-20 MED ORDER — TADALAFIL 20 MG PO TABS: 20.0000 mg | ORAL_TABLET | Freq: Every day | ORAL | Status: DC | PRN

## 2010-12-20 NOTE — Progress Notes (Signed)
Subjective:    Patient ID: Gary [name], male    DOB: 05-17-1935, 75 y.o.   MRN: [id number]  HPI    [name] Is a 75 year old, married male, nonsmoker, who comes in today for Medicare wellness examination because of a history of allergic rhinitis, erectile dysfunction, low libido, hyperlipidemia, and aortic stenosis.  He takes an 81-mg baby aspirin and 40 mg of Zocor nightly for hyperlipidemia.  Will check labs today.  He uses a steroid nasal spray for allergic rhinitis.  The Viagra 100 mg is not seen to be working and his libido is low.  Will check testosterone level.  He has history of A. S. Last echo two years ago, asymptomatic.  Will repeat echo this fall.  He gets routine eye care, dental care, hearing normal, activities of daily living normally walks on a daily basis or plays golf.  Cognitive function, normal, home health safety reviewed.  No issues identified.  He does have guns in the house, but the locked up.  He and his wife both have a  power of attorney and living will.  Tetanus 2006, Pneumovax, x 2, information given on shingles, last colonoscopy 2007 showed a couple polyps.  Recommend follow-up colonoscopy.  This spring.  He is already got a letter from GI.    Review of Systems  Constitutional: Negative.   HENT: Negative.   Eyes: Negative.   Respiratory: Negative.   Cardiovascular: Negative.   Gastrointestinal: Negative.   Genitourinary: Negative.   Musculoskeletal: Negative.   Skin: Negative.   Neurological: Negative.   Hematological: Negative.   Psychiatric/Behavioral: Negative.        Objective:   Physical Exam  Constitutional: He is oriented to person, place, and time. He appears well-developed and well-nourished.  HENT:  Head: Normocephalic and atraumatic.  Right Ear: External ear normal.  Left Ear: External ear normal.  Nose: Nose normal.  Mouth/Throat: Oropharynx is clear and moist.  Eyes: Conjunctivae and EOM are normal. Pupils are equal, round, and  reactive to light.  Neck: Normal range of motion. Neck supple. No JVD present. No tracheal deviation present. No thyromegaly present.  Cardiovascular: Normal rate, regular rhythm and intact distal pulses.  Exam reveals no gallop and no friction rub.   Murmur heard.      Grade 3 murmur of aortic stenosis  Pulmonary/Chest: Effort normal and breath sounds normal. No stridor. No respiratory distress. He has no wheezes. He has no rales. He exhibits no tenderness.  Abdominal: Soft. Bowel sounds are normal. He exhibits no distension and no mass. There is no tenderness. There is no rebound and no guarding.  Genitourinary: Rectum normal, prostate normal and penis normal. Guaiac negative stool. No penile tenderness.  Musculoskeletal: Normal range of motion. He exhibits no edema and no tenderness.  Lymphadenopathy:    He has no cervical adenopathy.  Neurological: He is alert and oriented to person, place, and time. He has normal reflexes. No cranial nerve deficit. He exhibits normal muscle tone.  Skin: Skin is warm and dry. No rash noted. No erythema. No pallor.  Psychiatric: He has a normal mood and affect. His behavior is normal. Judgment and thought content normal.          Assessment & Plan:  Healthy male.  Allergic rhinitis.  Continue steroid nasal spray.  Erectile dysfunction.  Low libido change to Cialis and check testosterone level.  History of hyperlipidemia.  Continue Zocor 40 nightly, an 81 mg, baby aspirin.  Check lipid level today.  History of l aortic stenosis, asymptomatic.  Check echo

## 2010-12-20 NOTE — Patient Instructions (Signed)
Continue your current medications.  Consider this shingles, vaccine.  We will set you up in cardiology for an echo.  Call GI set up your screening colonoscopy.  This fall.  Return in one year, sooner if any problems.  I will call you to report on your lab work

## 2010-12-22 ENCOUNTER — Other Ambulatory Visit (HOSPITAL_COMMUNITY): Payer: Medicare Other | Admitting: Radiology

## 2010-12-27 ENCOUNTER — Ambulatory Visit (HOSPITAL_COMMUNITY): Payer: Medicare Other | Attending: Family Medicine | Admitting: Radiology

## 2010-12-27 DIAGNOSIS — E785 Hyperlipidemia, unspecified: Secondary | ICD-10-CM | POA: Insufficient documentation

## 2010-12-27 DIAGNOSIS — R011 Cardiac murmur, unspecified: Secondary | ICD-10-CM | POA: Insufficient documentation

## 2010-12-27 DIAGNOSIS — I350 Nonrheumatic aortic (valve) stenosis: Secondary | ICD-10-CM

## 2011-01-09 ENCOUNTER — Encounter: Payer: Self-pay | Admitting: Gastroenterology

## 2011-01-25 ENCOUNTER — Ambulatory Visit (AMBULATORY_SURGERY_CENTER): Payer: Medicare Other | Admitting: *Deleted

## 2011-01-25 VITALS — Ht 75.0 in | Wt 245.0 lb

## 2011-01-25 DIAGNOSIS — Z1211 Encounter for screening for malignant neoplasm of colon: Secondary | ICD-10-CM

## 2011-01-25 MED ORDER — PEG-KCL-NACL-NASULF-NA ASC-C 100 G PO SOLR: ORAL | Status: DC

## 2011-02-08 ENCOUNTER — Encounter: Payer: Self-pay | Admitting: Gastroenterology

## 2011-02-08 ENCOUNTER — Ambulatory Visit (AMBULATORY_SURGERY_CENTER): Payer: Medicare Other | Admitting: Gastroenterology

## 2011-02-08 DIAGNOSIS — Z860100 Personal history of colon polyps, unspecified: Secondary | ICD-10-CM

## 2011-02-08 DIAGNOSIS — Z1211 Encounter for screening for malignant neoplasm of colon: Secondary | ICD-10-CM

## 2011-02-08 DIAGNOSIS — Z800 Family history of malignant neoplasm of digestive organs: Secondary | ICD-10-CM

## 2011-02-08 MED ORDER — SODIUM CHLORIDE 0.9 % IV SOLN: 500.0000 mL | INTRAVENOUS | Status: DC

## 2011-02-09 ENCOUNTER — Telehealth: Payer: Self-pay | Admitting: *Deleted

## 2011-02-09 LAB — POCT HEMOGLOBIN-HEMACUE
Hemoglobin: 16.1
Operator id: 128471

## 2011-02-09 NOTE — Telephone Encounter (Signed)
Message left to call us if necessary. 

## 2011-08-02 ENCOUNTER — Other Ambulatory Visit: Payer: Self-pay | Admitting: *Deleted

## 2011-08-02 DIAGNOSIS — E785 Hyperlipidemia, unspecified: Secondary | ICD-10-CM

## 2011-08-02 MED ORDER — SIMVASTATIN 40 MG PO TABS: 40.0000 mg | ORAL_TABLET | Freq: Every day | ORAL | Status: DC

## 2011-11-13 DIAGNOSIS — L719 Rosacea, unspecified: Secondary | ICD-10-CM | POA: Diagnosis not present

## 2012-01-18 ENCOUNTER — Encounter: Payer: Medicare Other | Admitting: Family Medicine

## 2012-01-22 ENCOUNTER — Ambulatory Visit (INDEPENDENT_AMBULATORY_CARE_PROVIDER_SITE_OTHER): Payer: Medicare Other | Admitting: Family Medicine

## 2012-01-22 ENCOUNTER — Encounter: Payer: Self-pay | Admitting: Family Medicine

## 2012-01-22 VITALS — BP 110/70 | Temp 97.4°F | Ht 75.25 in | Wt 238.0 lb

## 2012-01-22 DIAGNOSIS — N400 Enlarged prostate without lower urinary tract symptoms: Secondary | ICD-10-CM | POA: Diagnosis not present

## 2012-01-22 DIAGNOSIS — I350 Nonrheumatic aortic (valve) stenosis: Secondary | ICD-10-CM

## 2012-01-22 DIAGNOSIS — E785 Hyperlipidemia, unspecified: Secondary | ICD-10-CM

## 2012-01-22 DIAGNOSIS — J309 Allergic rhinitis, unspecified: Secondary | ICD-10-CM

## 2012-01-22 DIAGNOSIS — Z0000 Encounter for general adult medical examination without abnormal findings: Secondary | ICD-10-CM | POA: Diagnosis not present

## 2012-01-22 DIAGNOSIS — E039 Hypothyroidism, unspecified: Secondary | ICD-10-CM | POA: Diagnosis not present

## 2012-01-22 DIAGNOSIS — R972 Elevated prostate specific antigen [PSA]: Secondary | ICD-10-CM

## 2012-01-22 DIAGNOSIS — R011 Cardiac murmur, unspecified: Secondary | ICD-10-CM | POA: Insufficient documentation

## 2012-01-22 DIAGNOSIS — I359 Nonrheumatic aortic valve disorder, unspecified: Secondary | ICD-10-CM

## 2012-01-22 DIAGNOSIS — F528 Other sexual dysfunction not due to a substance or known physiological condition: Secondary | ICD-10-CM | POA: Diagnosis not present

## 2012-01-22 DIAGNOSIS — Z23 Encounter for immunization: Secondary | ICD-10-CM

## 2012-01-22 DIAGNOSIS — R6882 Decreased libido: Secondary | ICD-10-CM | POA: Diagnosis not present

## 2012-01-22 LAB — CBC WITH DIFFERENTIAL/PLATELET
Eosinophils Absolute: 0.1 10*3/uL (ref 0.0–0.7)
Lymphocytes Relative: 22.6 % (ref 12.0–46.0)
MCHC: 33.3 g/dL (ref 30.0–36.0)
MCV: 97.7 fl (ref 78.0–100.0)
Monocytes Absolute: 0.7 10*3/uL (ref 0.1–1.0)
Neutrophils Relative %: 62.1 % (ref 43.0–77.0)
Platelets: 163 10*3/uL (ref 150.0–400.0)
RBC: 4.87 Mil/uL (ref 4.22–5.81)
WBC: 5.4 10*3/uL (ref 4.5–10.5)

## 2012-01-22 LAB — POCT URINALYSIS DIPSTICK
Bilirubin, UA: NEGATIVE
Glucose, UA: NEGATIVE
Ketones, UA: NEGATIVE
Leukocytes, UA: NEGATIVE
Nitrite, UA: NEGATIVE

## 2012-01-22 MED ORDER — SIMVASTATIN 40 MG PO TABS
40.0000 mg | ORAL_TABLET | Freq: Every day | ORAL | Status: DC
Start: 2012-01-22 — End: 2013-04-15

## 2012-01-22 MED ORDER — FLUTICASONE PROPIONATE 50 MCG/ACT NA SUSP: 2.0000 | Freq: Every day | NASAL | Status: DC

## 2012-01-22 MED ORDER — VARDENAFIL HCL 20 MG PO TABS: 20.0000 mg | ORAL_TABLET | Freq: Every day | ORAL | Status: DC | PRN

## 2012-01-22 NOTE — Progress Notes (Signed)
  Subjective:    Patient ID: Gary [name], male    DOB: [birth year], 76 y.o.   MRN: [id number]  HPI Gary [name] Is a 76 year old married male nonsmoker who comes in today for a Medicare wellness examination because of a history of allergic rhinitis, hyperlipidemia, erectile dysfunction, and aortic stenosis  His medications were reviewed there've been no changes except for Cialis is no longer work.  He gets routine eye care, dental care, recent colonoscopy normal, tetanus 2006, Pneumovax x2, flu shot today, information given on shingles   Review of Systems  Constitutional: Negative.   HENT: Negative.   Eyes: Negative.   Respiratory: Negative.   Cardiovascular: Negative.   Gastrointestinal: Negative.   Genitourinary: Negative.   Musculoskeletal: Negative.   Skin: Negative.   Neurological: Negative.   Hematological: Negative.   Psychiatric/Behavioral: Negative.        Objective:   Physical Exam  Constitutional: He is oriented to person, place, and time. He appears well-developed and well-nourished.  HENT:  Head: Normocephalic and atraumatic.  Right Ear: External ear normal.  Left Ear: External ear normal.  Nose: Nose normal.  Mouth/Throat: Oropharynx is clear and moist.  Eyes: Conjunctivae normal and EOM are normal. Pupils are equal, round, and reactive to light.  Neck: Normal range of motion. Neck supple. No JVD present. No tracheal deviation present. No thyromegaly present.  Cardiovascular: Normal rate, regular rhythm, normal heart sounds and intact distal pulses.  Exam reveals no gallop and no friction rub.   No murmur heard. Pulmonary/Chest: Effort normal and breath sounds normal. No stridor. No respiratory distress. He has no wheezes. He has no rales. He exhibits no tenderness.  Abdominal: Soft. Bowel sounds are normal. He exhibits no distension and no mass. There is no tenderness. There is no rebound and no guarding.  Genitourinary: Rectum normal, prostate normal and penis  normal. Guaiac negative stool. No penile tenderness.  Musculoskeletal: Normal range of motion. He exhibits no edema and no tenderness.  Lymphadenopathy:    He has no cervical adenopathy.  Neurological: He is alert and oriented to person, place, and time. He has normal reflexes. No cranial nerve deficit. He exhibits normal muscle tone.  Skin: Skin is warm and dry. No rash noted. No erythema. No pallor.       Body skin exam normal except for chronic sun damage especially the nose. He sees his dermatologist yearly  Psychiatric: He has a normal mood and affect. His behavior is normal. Judgment and thought content normal.   cardiac skin exam shows a grade 2/6 systolic ejection murmur consistent with aortic stenosis        Assessment & Plan:  Healthy male  Allergic rhinitis switch to Flonase  Hyperlipidemia continue Zocor  Cardiac murmur,,,,,,,,,,,, mitral murmur aortic valve normal as documented by echo last year  Erectile dysfunction with low testosterone trial of Levitra recheck testoste

## 2012-01-22 NOTE — Patient Instructions (Signed)
Continue your current medications  I will call you on the gets her lab work back  Followup in 1 year sooner if any problems

## 2012-01-23 LAB — BASIC METABOLIC PANEL
Chloride: 106 mEq/L (ref 96–112)
GFR: 87.71 mL/min (ref >60.00)
Potassium: 5.6 mEq/L — ABNORMAL HIGH (ref 3.5–5.1)
Sodium: 143 mEq/L (ref 135–145)

## 2012-01-23 LAB — TSH: TSH: 2.6 u[IU]/mL (ref 0.35–5.50)

## 2012-01-23 LAB — HEPATIC FUNCTION PANEL
ALT: 26 U/L (ref 0–53)
Albumin: 3.9 g/dL (ref 3.5–5.2)
Bilirubin, Direct: 0.2 mg/dL (ref 0.0–0.3)
Total Protein: 6.8 g/dL (ref 6.0–8.3)

## 2012-01-23 LAB — LIPID PANEL
Cholesterol: 209 mg/dL — ABNORMAL HIGH (ref 0–200)
VLDL: 16 mg/dL (ref 0.0–40.0)

## 2012-01-23 LAB — LDL CHOLESTEROL, DIRECT: Direct LDL: 143.5 mg/dL

## 2012-01-23 LAB — TESTOSTERONE: Testosterone: 303.36 ng/dL — ABNORMAL LOW (ref 350.00–890.00)

## 2012-01-29 ENCOUNTER — Telehealth: Payer: Self-pay | Admitting: Family Medicine

## 2012-01-29 NOTE — Telephone Encounter (Signed)
Caller: [name]/Patient; Patient Name: Gary [name]; PCP: Kelle Darting [facility]); Best Callback Phone Number: [phone] Was seen in office 9-23 fro annual physical. Has not received lab work and would like it emailed if pssible or can pick up in office. Email address is [email or web address] PLEASE NOTIFY PATIENT IF CAN MAIL, EMAIL OR HE CAN PICK UP RESULTS IN OFFICE

## 2012-01-31 NOTE — Telephone Encounter (Signed)
Left message on machine for patient that copy is ready for pick up

## 2012-03-13 DIAGNOSIS — D230 Other benign neoplasm of skin of lip: Secondary | ICD-10-CM | POA: Diagnosis not present

## 2012-03-13 DIAGNOSIS — L821 Other seborrheic keratosis: Secondary | ICD-10-CM | POA: Diagnosis not present

## 2012-03-13 DIAGNOSIS — Z85828 Personal history of other malignant neoplasm of skin: Secondary | ICD-10-CM | POA: Diagnosis not present

## 2012-03-13 DIAGNOSIS — L570 Actinic keratosis: Secondary | ICD-10-CM | POA: Diagnosis not present

## 2012-03-13 DIAGNOSIS — L819 Disorder of pigmentation, unspecified: Secondary | ICD-10-CM | POA: Diagnosis not present

## 2012-07-26 ENCOUNTER — Other Ambulatory Visit: Payer: Self-pay | Admitting: Dermatology

## 2012-07-26 DIAGNOSIS — D485 Neoplasm of uncertain behavior of skin: Secondary | ICD-10-CM | POA: Diagnosis not present

## 2012-07-26 DIAGNOSIS — L820 Inflamed seborrheic keratosis: Secondary | ICD-10-CM | POA: Diagnosis not present

## 2012-07-26 DIAGNOSIS — D234 Other benign neoplasm of skin of scalp and neck: Secondary | ICD-10-CM | POA: Diagnosis not present

## 2012-07-26 DIAGNOSIS — Z85828 Personal history of other malignant neoplasm of skin: Secondary | ICD-10-CM | POA: Diagnosis not present

## 2012-12-25 ENCOUNTER — Telehealth: Payer: Self-pay | Admitting: Family Medicine

## 2012-12-25 DIAGNOSIS — E785 Hyperlipidemia, unspecified: Secondary | ICD-10-CM

## 2012-12-25 DIAGNOSIS — R351 Nocturia: Secondary | ICD-10-CM

## 2012-12-25 NOTE — Telephone Encounter (Signed)
Labs ordered.

## 2012-12-25 NOTE — Telephone Encounter (Signed)
PT states that although he has medicare, Dr. Tawanna Cooler wanted to bring in him for his fasting labs a week in advance. Please place orders.

## 2013-02-17 ENCOUNTER — Telehealth: Payer: Self-pay | Admitting: Family Medicine

## 2013-02-17 DIAGNOSIS — R6882 Decreased libido: Secondary | ICD-10-CM

## 2013-02-17 DIAGNOSIS — R351 Nocturia: Secondary | ICD-10-CM

## 2013-02-17 DIAGNOSIS — E785 Hyperlipidemia, unspecified: Secondary | ICD-10-CM

## 2013-02-17 DIAGNOSIS — R011 Cardiac murmur, unspecified: Secondary | ICD-10-CM

## 2013-02-17 DIAGNOSIS — I350 Nonrheumatic aortic (valve) stenosis: Secondary | ICD-10-CM

## 2013-02-17 NOTE — Telephone Encounter (Signed)
Pt would like to have cpx labs in advance (medicare pri) Pt stated MD said last yr for him to have labs in advance due to heart issues. Please put order in

## 2013-02-17 NOTE — Telephone Encounter (Signed)
Labs ordered.

## 2013-02-18 NOTE — Telephone Encounter (Signed)
done

## 2013-03-06 DIAGNOSIS — H43819 Vitreous degeneration, unspecified eye: Secondary | ICD-10-CM | POA: Diagnosis not present

## 2013-03-06 DIAGNOSIS — H25099 Other age-related incipient cataract, unspecified eye: Secondary | ICD-10-CM | POA: Diagnosis not present

## 2013-03-06 DIAGNOSIS — H2510 Age-related nuclear cataract, unspecified eye: Secondary | ICD-10-CM | POA: Diagnosis not present

## 2013-03-06 DIAGNOSIS — H4320 Crystalline deposits in vitreous body, unspecified eye: Secondary | ICD-10-CM | POA: Diagnosis not present

## 2013-03-14 DIAGNOSIS — L819 Disorder of pigmentation, unspecified: Secondary | ICD-10-CM | POA: Diagnosis not present

## 2013-03-14 DIAGNOSIS — L570 Actinic keratosis: Secondary | ICD-10-CM | POA: Diagnosis not present

## 2013-03-14 DIAGNOSIS — Z85828 Personal history of other malignant neoplasm of skin: Secondary | ICD-10-CM | POA: Diagnosis not present

## 2013-03-14 DIAGNOSIS — L821 Other seborrheic keratosis: Secondary | ICD-10-CM | POA: Diagnosis not present

## 2013-03-14 DIAGNOSIS — D1801 Hemangioma of skin and subcutaneous tissue: Secondary | ICD-10-CM | POA: Diagnosis not present

## 2013-03-24 ENCOUNTER — Encounter: Payer: Medicare Other | Admitting: Family Medicine

## 2013-04-08 ENCOUNTER — Other Ambulatory Visit (INDEPENDENT_AMBULATORY_CARE_PROVIDER_SITE_OTHER): Payer: Medicare Other

## 2013-04-08 DIAGNOSIS — R011 Cardiac murmur, unspecified: Secondary | ICD-10-CM | POA: Diagnosis not present

## 2013-04-08 DIAGNOSIS — R351 Nocturia: Secondary | ICD-10-CM

## 2013-04-08 DIAGNOSIS — Z125 Encounter for screening for malignant neoplasm of prostate: Secondary | ICD-10-CM | POA: Diagnosis not present

## 2013-04-08 DIAGNOSIS — E785 Hyperlipidemia, unspecified: Secondary | ICD-10-CM | POA: Diagnosis not present

## 2013-04-08 DIAGNOSIS — I359 Nonrheumatic aortic valve disorder, unspecified: Secondary | ICD-10-CM

## 2013-04-08 DIAGNOSIS — R6882 Decreased libido: Secondary | ICD-10-CM

## 2013-04-08 DIAGNOSIS — I350 Nonrheumatic aortic (valve) stenosis: Secondary | ICD-10-CM

## 2013-04-08 LAB — CBC WITH DIFFERENTIAL/PLATELET
Basophils Absolute: 0 10*3/uL (ref 0.0–0.1)
Basophils Relative: 0.3 % (ref 0.0–3.0)
Eosinophils Absolute: 0.1 10*3/uL (ref 0.0–0.7)
Hemoglobin: 16.5 g/dL (ref 13.0–17.0)
Lymphocytes Relative: 23.3 % (ref 12.0–46.0)
MCHC: 34.1 g/dL (ref 30.0–36.0)
Monocytes Relative: 11.7 % (ref 3.0–12.0)
Neutro Abs: 3.2 10*3/uL (ref 1.4–7.7)
Neutrophils Relative %: 62.9 % (ref 43.0–77.0)
RBC: 5.08 Mil/uL (ref 4.22–5.81)
RDW: 13.8 % (ref 11.5–14.6)

## 2013-04-08 LAB — POCT URINALYSIS DIPSTICK
Bilirubin, UA: NEGATIVE
Glucose, UA: NEGATIVE
Nitrite, UA: NEGATIVE
Urobilinogen, UA: 0.2
pH, UA: 5.5

## 2013-04-08 LAB — PSA: PSA: 0.64 ng/mL (ref 0.10–4.00)

## 2013-04-08 LAB — HEPATIC FUNCTION PANEL
ALT: 29 U/L (ref 0–53)
AST: 26 U/L (ref 0–37)
Albumin: 3.8 g/dL (ref 3.5–5.2)
Bilirubin, Direct: 0.2 mg/dL (ref 0.0–0.3)
Total Protein: 6.4 g/dL (ref 6.0–8.3)

## 2013-04-08 LAB — BASIC METABOLIC PANEL
CO2: 27 mEq/L (ref 19–32)
Chloride: 103 mEq/L (ref 96–112)
Glucose, Bld: 107 mg/dL — ABNORMAL HIGH (ref 70–99)
Potassium: 5 mEq/L (ref 3.5–5.1)
Sodium: 137 mEq/L (ref 135–145)

## 2013-04-08 LAB — LIPID PANEL
HDL: 46.4 mg/dL (ref >39.00)
Total CHOL/HDL Ratio: 3
Triglycerides: 50 mg/dL (ref 0.0–149.0)
VLDL: 10 mg/dL (ref 0.0–40.0)

## 2013-04-15 ENCOUNTER — Encounter: Payer: Self-pay | Admitting: Family Medicine

## 2013-04-15 ENCOUNTER — Ambulatory Visit (INDEPENDENT_AMBULATORY_CARE_PROVIDER_SITE_OTHER): Payer: Medicare Other | Admitting: Family Medicine

## 2013-04-15 VITALS — BP 150/90 | Temp 98.2°F | Ht 75.0 in | Wt 246.0 lb

## 2013-04-15 DIAGNOSIS — F528 Other sexual dysfunction not due to a substance or known physiological condition: Secondary | ICD-10-CM

## 2013-04-15 DIAGNOSIS — Z23 Encounter for immunization: Secondary | ICD-10-CM | POA: Diagnosis not present

## 2013-04-15 DIAGNOSIS — R011 Cardiac murmur, unspecified: Secondary | ICD-10-CM

## 2013-04-15 DIAGNOSIS — Z0000 Encounter for general adult medical examination without abnormal findings: Secondary | ICD-10-CM

## 2013-04-15 DIAGNOSIS — I359 Nonrheumatic aortic valve disorder, unspecified: Secondary | ICD-10-CM

## 2013-04-15 DIAGNOSIS — J309 Allergic rhinitis, unspecified: Secondary | ICD-10-CM

## 2013-04-15 DIAGNOSIS — I350 Nonrheumatic aortic (valve) stenosis: Secondary | ICD-10-CM

## 2013-04-15 DIAGNOSIS — E785 Hyperlipidemia, unspecified: Secondary | ICD-10-CM

## 2013-04-15 MED ORDER — FLUTICASONE PROPIONATE 50 MCG/ACT NA SUSP: 2.0000 | Freq: Every day | NASAL | Status: DC

## 2013-04-15 MED ORDER — SIMVASTATIN 40 MG PO TABS: 40.0000 mg | ORAL_TABLET | Freq: Every day | ORAL | Status: DC

## 2013-04-15 MED ORDER — SILDENAFIL CITRATE 100 MG PO TABS: 50.0000 mg | ORAL_TABLET | Freq: Every day | ORAL | Status: DC | PRN

## 2013-04-15 NOTE — Addendum Note (Signed)
Addended by: Kern Reap B on: 04/15/2013 12:35 PM   Modules accepted: Orders

## 2013-04-15 NOTE — Progress Notes (Signed)
   Subjective:    Patient ID: Gary [name], male    DOB: Sep 10, 1935, 77 y.o.   MRN: [id number]  HPI Gary [name] is a 77 year old married male nonsmoker who comes in today for a Medicare wellness examination because of a history of a heart murmur.......... aortic stenosis recent echo okay and clinically asymptomatic......... hyperlipidemia, erectile dysfunction, allergic rhinitis  Med list reviewed no changes except that the Cialis and the Levitra does seem to be working very well. We discussed a trial of another medication versus consult with [name]. He wishes to try some Viagra again first  He gets routine eye care, dental care, colonoscopy and GI, vaccinations up-to-date  Cognitive function normal he exercises on a regular basis home health safety reviewed no issues identified, no guns in the house, he does have a health care power of attorney and living will.   Review of Systems  Constitutional: Negative.   HENT: Negative.   Eyes: Negative.   Respiratory: Negative.   Cardiovascular: Negative.   Gastrointestinal: Negative.   Endocrine: Negative.   Genitourinary: Negative.   Musculoskeletal: Negative.   Skin: Negative.   Allergic/Immunologic: Negative.   Neurological: Negative.   Hematological: Negative.   Psychiatric/Behavioral: Negative.        Objective:   Physical Exam  Nursing note and vitals reviewed. Constitutional: He is oriented to person, place, and time. He appears well-developed and well-nourished.  HENT:  Head: Normocephalic and atraumatic.  Right Ear: External ear normal.  Left Ear: External ear normal.  Nose: Nose normal.  Mouth/Throat: Oropharynx is clear and moist.  Eyes: Conjunctivae and EOM are normal. Pupils are equal, round, and reactive to light.  Neck: Normal range of motion. Neck supple. No JVD present. No tracheal deviation present. No thyromegaly present.  Cardiovascular: Normal rate, regular rhythm and intact distal pulses.   Exam reveals no gallop and no friction rub.   Murmur heard. Grade 2 murmur of aortic stenosis  Pulmonary/Chest: Effort normal and breath sounds normal. No stridor. No respiratory distress. He has no wheezes. He has no rales. He exhibits no tenderness.  Abdominal: Soft. Bowel sounds are normal. He exhibits no distension and no mass. There is no tenderness. There is no rebound and no guarding.  Genitourinary: Rectum normal, prostate normal and penis normal. Guaiac negative stool. No penile tenderness.  Musculoskeletal: Normal range of motion. He exhibits no edema and no tenderness.  Lymphadenopathy:    He has no cervical adenopathy.  Neurological: He is alert and oriented to person, place, and time. He has normal reflexes. No cranial nerve deficit. He exhibits normal muscle tone.  Skin: Skin is warm and dry. No rash noted. No erythema. No pallor.  Total body skin exam normal recent dermatology exam normal except for of a frozen 2 places on his for head  Psychiatric: He has a normal mood and affect. His behavior is normal. Judgment and thought content normal.          Assessment & Plan:A healthy male  Allergic rhinitis continue Flonase  Hyperlipidemia continue Zocor 40 and an aspirin  Erectile dysfunction try Viagra 100 mg urologic consult with Dr. Laverle [name] when necessary   healthy male healthy male  Healthy male  Heart murmur unchanged echo 2 years ago normal  Allergic rhinitis continue steroid nasal spray  Hyperlipidemia continue Zocor and aspirin  Erectile dysfunction retry Viagra consult was less Gary [name].

## 2013-04-15 NOTE — Patient Instructions (Signed)
Continue the current medications  Tried the Viagra 100 mg one hour prior to sex........ if that does not help then I would consult with Dr. Crecencio Mc at the [facility]rology [facility]  Return in one year sooner if any problems

## 2013-09-01 ENCOUNTER — Other Ambulatory Visit: Payer: Self-pay | Admitting: Dermatology

## 2013-09-01 DIAGNOSIS — L570 Actinic keratosis: Secondary | ICD-10-CM | POA: Diagnosis not present

## 2013-09-01 DIAGNOSIS — C44721 Squamous cell carcinoma of skin of unspecified lower limb, including hip: Secondary | ICD-10-CM | POA: Diagnosis not present

## 2013-09-01 DIAGNOSIS — Z85828 Personal history of other malignant neoplasm of skin: Secondary | ICD-10-CM | POA: Diagnosis not present

## 2013-09-01 DIAGNOSIS — D485 Neoplasm of uncertain behavior of skin: Secondary | ICD-10-CM | POA: Diagnosis not present

## 2013-09-02 ENCOUNTER — Ambulatory Visit (INDEPENDENT_AMBULATORY_CARE_PROVIDER_SITE_OTHER): Payer: Medicare Other | Admitting: Family Medicine

## 2013-09-02 ENCOUNTER — Encounter: Payer: Self-pay | Admitting: Family Medicine

## 2013-09-02 VITALS — BP 164/90 | Temp 98.3°F | Wt 242.0 lb

## 2013-09-02 DIAGNOSIS — K115 Sialolithiasis: Secondary | ICD-10-CM

## 2013-09-02 NOTE — Progress Notes (Signed)
   Subjective:    Patient ID: Gary [name], male    DOB: [birth year], 78 y.o.   MRN: [id number]  [place] a 78 year old married male nonsmoker who comes in today for evaluation of swelling on the left side of his jaw since Saturday  He states and started when he was eating and had the sudden onset of swelling of the angle of his left jaw. Of her couple hours went away. Yesterday 8 some strawberries and happen again.   Review of Systems    review of systems otherwise negative Objective:   Physical Exam Well-developed well-nourished male no acute distress HEENT were negative some tenderness around the parotid gland left side angle of the jaw       Assessment & Plan:  Left parotid stone reassured consult with Radene Journey when necessary

## 2013-09-02 NOTE — Progress Notes (Signed)
Pre visit review using our clinic review tool, if applicable. No additional management support is needed unless otherwise documented below in the visit note. 

## 2013-09-02 NOTE — Patient Instructions (Signed)
Suck on lemon drops until the stone passes  Dr. Radene Journey when necessary

## 2013-09-10 DIAGNOSIS — N529 Male erectile dysfunction, unspecified: Secondary | ICD-10-CM | POA: Diagnosis not present

## 2013-10-14 ENCOUNTER — Other Ambulatory Visit: Payer: Self-pay | Admitting: Dermatology

## 2013-10-14 DIAGNOSIS — Z85828 Personal history of other malignant neoplasm of skin: Secondary | ICD-10-CM | POA: Diagnosis not present

## 2013-10-14 DIAGNOSIS — C44621 Squamous cell carcinoma of skin of unspecified upper limb, including shoulder: Secondary | ICD-10-CM | POA: Diagnosis not present

## 2013-10-14 DIAGNOSIS — D485 Neoplasm of uncertain behavior of skin: Secondary | ICD-10-CM | POA: Diagnosis not present

## 2014-03-13 ENCOUNTER — Ambulatory Visit: Payer: Medicare Other | Admitting: *Deleted

## 2014-03-13 ENCOUNTER — Ambulatory Visit (INDEPENDENT_AMBULATORY_CARE_PROVIDER_SITE_OTHER): Payer: Medicare Other | Admitting: *Deleted

## 2014-03-13 DIAGNOSIS — Z23 Encounter for immunization: Secondary | ICD-10-CM | POA: Diagnosis not present

## 2014-03-20 DIAGNOSIS — L570 Actinic keratosis: Secondary | ICD-10-CM | POA: Diagnosis not present

## 2014-03-20 DIAGNOSIS — L821 Other seborrheic keratosis: Secondary | ICD-10-CM | POA: Diagnosis not present

## 2014-03-20 DIAGNOSIS — Z85828 Personal history of other malignant neoplasm of skin: Secondary | ICD-10-CM | POA: Diagnosis not present

## 2014-05-05 ENCOUNTER — Other Ambulatory Visit (INDEPENDENT_AMBULATORY_CARE_PROVIDER_SITE_OTHER): Payer: Medicare Other

## 2014-05-05 ENCOUNTER — Telehealth: Payer: Self-pay | Admitting: *Deleted

## 2014-05-05 DIAGNOSIS — E039 Hypothyroidism, unspecified: Secondary | ICD-10-CM

## 2014-05-05 DIAGNOSIS — R300 Dysuria: Secondary | ICD-10-CM

## 2014-05-05 DIAGNOSIS — I10 Essential (primary) hypertension: Secondary | ICD-10-CM | POA: Diagnosis not present

## 2014-05-05 DIAGNOSIS — D649 Anemia, unspecified: Secondary | ICD-10-CM

## 2014-05-05 DIAGNOSIS — N400 Enlarged prostate without lower urinary tract symptoms: Secondary | ICD-10-CM

## 2014-05-05 DIAGNOSIS — E785 Hyperlipidemia, unspecified: Secondary | ICD-10-CM

## 2014-05-05 LAB — BASIC METABOLIC PANEL
BUN: 21 mg/dL (ref 6–23)
CO2: 29 meq/L (ref 19–32)
Calcium: 9.4 mg/dL (ref 8.4–10.5)
Chloride: 110 mEq/L (ref 96–112)
Creatinine, Ser: 1.2 mg/dL (ref 0.4–1.5)
GFR: 63.15 mL/min (ref >60.00)
Glucose, Bld: 121 mg/dL — ABNORMAL HIGH (ref 70–99)
POTASSIUM: 6.2 meq/L — AB (ref 3.5–5.1)
Sodium: 146 mEq/L — ABNORMAL HIGH (ref 135–145)

## 2014-05-05 LAB — CBC WITH DIFFERENTIAL/PLATELET
Basophils Absolute: 0 10*3/uL (ref 0.0–0.1)
Basophils Relative: 0.3 % (ref 0.0–3.0)
EOS PCT: 3.1 % (ref 0.0–5.0)
Eosinophils Absolute: 0.2 10*3/uL (ref 0.0–0.7)
HCT: 50.3 % (ref 39.0–52.0)
Hemoglobin: 16.8 g/dL (ref 13.0–17.0)
Lymphocytes Relative: 23.5 % (ref 12.0–46.0)
Lymphs Abs: 1.3 10*3/uL (ref 0.7–4.0)
MCHC: 33.4 g/dL (ref 30.0–36.0)
MCV: 96.2 fl (ref 78.0–100.0)
MONO ABS: 0.7 10*3/uL (ref 0.1–1.0)
MONOS PCT: 12.5 % — AB (ref 3.0–12.0)
NEUTROS PCT: 60.6 % (ref 43.0–77.0)
Neutro Abs: 3.3 10*3/uL (ref 1.4–7.7)
Platelets: 135 10*3/uL — ABNORMAL LOW (ref 150.0–400.0)
RBC: 5.23 Mil/uL (ref 4.22–5.81)
RDW: 13.6 % (ref 11.5–15.5)
WBC: 5.4 10*3/uL (ref 4.0–10.5)

## 2014-05-05 LAB — POCT URINALYSIS DIPSTICK
Blood, UA: NEGATIVE
Glucose, UA: NEGATIVE
KETONES UA: NEGATIVE
LEUKOCYTES UA: NEGATIVE
Nitrite, UA: NEGATIVE
Spec Grav, UA: 1.025
UROBILINOGEN UA: 1
pH, UA: 6

## 2014-05-05 LAB — HEPATIC FUNCTION PANEL
ALK PHOS: 71 U/L (ref 39–117)
ALT: 29 U/L (ref 0–53)
AST: 24 U/L (ref 0–37)
Albumin: 3.9 g/dL (ref 3.5–5.2)
BILIRUBIN DIRECT: 0.2 mg/dL (ref 0.0–0.3)
BILIRUBIN TOTAL: 1.3 mg/dL — AB (ref 0.2–1.2)
Total Protein: 6.7 g/dL (ref 6.0–8.3)

## 2014-05-05 LAB — LIPID PANEL
CHOL/HDL RATIO: 4
Cholesterol: 205 mg/dL — ABNORMAL HIGH (ref 0–200)
HDL: 50 mg/dL (ref >39.00)
LDL Cholesterol: 136 mg/dL — ABNORMAL HIGH (ref 0–99)
NONHDL: 155
Triglycerides: 95 mg/dL (ref 0.0–149.0)
VLDL: 19 mg/dL (ref 0.0–40.0)

## 2014-05-05 LAB — PSA: PSA: 1.06 ng/mL (ref 0.10–4.00)

## 2014-05-05 LAB — TSH: TSH: 3.56 u[IU]/mL (ref 0.35–4.50)

## 2014-05-05 NOTE — Telephone Encounter (Signed)
Per Dr Sherren Mocha  - lab error.  Will check potassium levels again when patient comes in for visit.

## 2014-05-05 NOTE — Telephone Encounter (Signed)
Pauletta Browns called from the lab at [facility] with a critical potassium result of 6.2.  Message was given to [facility].

## 2014-05-12 ENCOUNTER — Encounter: Payer: Self-pay | Admitting: Family Medicine

## 2014-05-12 ENCOUNTER — Ambulatory Visit (INDEPENDENT_AMBULATORY_CARE_PROVIDER_SITE_OTHER): Payer: Medicare Other | Admitting: Family Medicine

## 2014-05-12 VITALS — BP 130/90 | Ht 75.0 in | Wt 238.0 lb

## 2014-05-12 DIAGNOSIS — J301 Allergic rhinitis due to pollen: Secondary | ICD-10-CM

## 2014-05-12 DIAGNOSIS — R351 Nocturia: Secondary | ICD-10-CM | POA: Diagnosis not present

## 2014-05-12 DIAGNOSIS — N401 Enlarged prostate with lower urinary tract symptoms: Secondary | ICD-10-CM | POA: Insufficient documentation

## 2014-05-12 DIAGNOSIS — F528 Other sexual dysfunction not due to a substance or known physiological condition: Secondary | ICD-10-CM | POA: Diagnosis not present

## 2014-05-12 DIAGNOSIS — E785 Hyperlipidemia, unspecified: Secondary | ICD-10-CM | POA: Diagnosis not present

## 2014-05-12 DIAGNOSIS — R011 Cardiac murmur, unspecified: Secondary | ICD-10-CM | POA: Diagnosis not present

## 2014-05-12 DIAGNOSIS — R6882 Decreased libido: Secondary | ICD-10-CM

## 2014-05-12 DIAGNOSIS — Z23 Encounter for immunization: Secondary | ICD-10-CM | POA: Diagnosis not present

## 2014-05-12 DIAGNOSIS — I484 Atypical atrial flutter: Secondary | ICD-10-CM

## 2014-05-12 DIAGNOSIS — I350 Nonrheumatic aortic (valve) stenosis: Secondary | ICD-10-CM | POA: Diagnosis not present

## 2014-05-12 MED ORDER — SILDENAFIL CITRATE 100 MG PO TABS: 50.0000 mg | ORAL_TABLET | Freq: Every day | ORAL | Status: DC | PRN

## 2014-05-12 MED ORDER — FLUTICASONE PROPIONATE 50 MCG/ACT NA SUSP: 2.0000 | Freq: Every day | NASAL | Status: DC

## 2014-05-12 MED ORDER — SIMVASTATIN 40 MG PO TABS: 40.0000 mg | ORAL_TABLET | Freq: Every day | ORAL | Status: DC

## 2014-05-12 MED ORDER — MINOCYCLINE HCL 100 MG PO CAPS: 100.0000 mg | ORAL_CAPSULE | Freq: Every day | ORAL | Status: DC

## 2014-05-12 NOTE — Patient Instructions (Signed)
Continue current medications  We will get you set up an echocardiogram to reevaluate your valve however your murmur is unchanged therefore I do not think it is anything to be concerned about at this juncture  Continue exercise program  Follow-up in one year sooner if any problem

## 2014-05-12 NOTE — Progress Notes (Signed)
Pre visit review using our clinic review tool, if applicable. No additional management support is needed unless otherwise documented below in the visit note. 

## 2014-05-12 NOTE — Progress Notes (Signed)
   Subjective:    Patient ID: Gary [name], male    DOB: 18-Jul-1935, 79 y.o.   MRN: [id number]  HPI Gary [name] is a 79 year old married male nonsmoker who comes in today for general physical examination because of a history of allergic rhinitis, rosacea, erectile dysfunction, hyperlipidemia, and aortic stenosis  Med list reviewed the been no changes  He has a history of aortic stenosis. Last echo was a couple years ago. He remains asymptomatic  He gets routine eye care, dental care, colonoscopy recently normal, vaccinations updated by Gary [name]  Cognitive function normal he walks on a regular basis home health safety reviewed no issues identified, no guns in the house, he does not have a healthcare power of attorney nor living well. Advised to do so   Review of Systems  Constitutional: Negative.   HENT: Negative.   Eyes: Negative.   Respiratory: Negative.   Cardiovascular: Negative.   Gastrointestinal: Negative.   Endocrine: Negative.   Genitourinary: Negative.   Musculoskeletal: Negative.   Skin: Negative.   Allergic/Immunologic: Negative.   Neurological: Negative.   Hematological: Negative.   Psychiatric/Behavioral: Negative.        Objective:   Physical Exam  Constitutional: He is oriented to person, place, and time. He appears well-developed and well-nourished.  HENT:  Head: Normocephalic and atraumatic.  Right Ear: External ear normal.  Left Ear: External ear normal.  Nose: Nose normal.  Mouth/Throat: Oropharynx is clear and moist.  Eyes: Conjunctivae and EOM are normal. Pupils are equal, round, and reactive to light.  Neck: Normal range of motion. Neck supple. No JVD present. No tracheal deviation present. No thyromegaly present.  Cardiovascular: Normal rate, regular rhythm and intact distal pulses.  Exam reveals no gallop and no friction rub.   Murmur heard. Grade 3/6 systolic ejection murmur consistent with AS.........[name] murmur unchanged from last year    Pulmonary/Chest: Effort normal and breath sounds normal. No stridor. No respiratory distress. He has no wheezes. He has no rales. He exhibits no tenderness.  Abdominal: Soft. Bowel sounds are normal. He exhibits no distension and no mass. There is no tenderness. There is no rebound and no guarding.  Genitourinary: Rectum normal and penis normal. Guaiac negative stool. No penile tenderness.  1+ symmetrical nonnodular BPH  Musculoskeletal: Normal range of motion. He exhibits no edema or tenderness.  Lymphadenopathy:    He has no cervical adenopathy.  Neurological: He is alert and oriented to person, place, and time. He has normal reflexes. No cranial nerve deficit. He exhibits normal muscle tone.  Skin: Skin is warm and dry. No rash noted. No erythema. No pallor.  Total body skin exam normal  Psychiatric: He has a normal mood and affect. His behavior is normal. Judgment and thought content normal.  Nursing note and vitals reviewed.         Assessment & Plan:  Aortic stenosis.... Murmur unchanged on physical examination.......[name] echocardiogram  Allergic rhinitis continue Flonase  Rosacea continue doxycycline  Erectile dysfunction continue her Viagra  Hyperlipidemia continue Zocor 40 mg daily and an aspirin tablet

## 2014-05-14 ENCOUNTER — Other Ambulatory Visit: Payer: Self-pay | Admitting: *Deleted

## 2014-05-14 ENCOUNTER — Other Ambulatory Visit: Payer: Self-pay | Admitting: Family Medicine

## 2014-05-14 DIAGNOSIS — I499 Cardiac arrhythmia, unspecified: Secondary | ICD-10-CM | POA: Insufficient documentation

## 2014-05-14 DIAGNOSIS — E785 Hyperlipidemia, unspecified: Secondary | ICD-10-CM

## 2014-05-14 DIAGNOSIS — F528 Other sexual dysfunction not due to a substance or known physiological condition: Secondary | ICD-10-CM

## 2014-05-14 MED ORDER — MINOCYCLINE HCL 100 MG PO CAPS: 100.0000 mg | ORAL_CAPSULE | Freq: Every day | ORAL | Status: DC

## 2014-05-14 MED ORDER — SIMVASTATIN 40 MG PO TABS: 40.0000 mg | ORAL_TABLET | Freq: Every day | ORAL | Status: DC

## 2014-05-14 NOTE — Progress Notes (Signed)
   Subjective:    Patient ID: Gary [name], male    DOB: October 02, 1935, 79 y.o.   MRN: [id number]  HPI When we saw Mr. Speranza the other day his pulse is 70 and regular. We did a cardiogram which showed atrial flutter. However the EKG machine has been malfunctioning. Therefore asked him to come back today for review and repeat EKG. Today his pulse is slightly irregular EKG shows atrial flutter. Historically in the past she's had some episodes of lightheadedness and a feeling of palpitations but no syncope etc.   Review of Systems    review of systems otherwise negative Objective:   Physical Exam  Well-developed well-nourished male no acute distress vital signs stable he is afebrile pulse 90 and slightly irregular      Assessment & Plan:  Atrial flutter asymptomatic.......... cardiac consult,,,, for further evaluation

## 2014-05-14 NOTE — Addendum Note (Signed)
Addended by: TODD, JEFFREY A on: 05/14/2014 02:02 PM   Modules accepted: Orders

## 2014-05-20 ENCOUNTER — Ambulatory Visit (HOSPITAL_COMMUNITY): Payer: Medicare Other | Attending: Cardiovascular Disease | Admitting: Cardiology

## 2014-05-20 DIAGNOSIS — I350 Nonrheumatic aortic (valve) stenosis: Secondary | ICD-10-CM | POA: Diagnosis not present

## 2014-05-20 NOTE — Progress Notes (Signed)
Echo performed. 

## 2014-05-26 ENCOUNTER — Telehealth: Payer: Self-pay | Admitting: Cardiovascular Disease

## 2014-05-26 NOTE — Telephone Encounter (Signed)
Close encounter   Scheduled pt to see Dr. Loletha Grayer

## 2014-06-04 ENCOUNTER — Institutional Professional Consult (permissible substitution): Payer: Medicare Other | Admitting: Cardiology

## 2014-06-09 ENCOUNTER — Other Ambulatory Visit: Payer: Self-pay | Admitting: Family Medicine

## 2014-06-26 ENCOUNTER — Telehealth: Payer: Self-pay | Admitting: *Deleted

## 2014-06-26 ENCOUNTER — Encounter: Payer: Self-pay | Admitting: Cardiovascular Disease

## 2014-06-26 ENCOUNTER — Other Ambulatory Visit: Payer: Self-pay | Admitting: *Deleted

## 2014-06-26 ENCOUNTER — Ambulatory Visit (INDEPENDENT_AMBULATORY_CARE_PROVIDER_SITE_OTHER): Payer: Medicare Other | Admitting: Cardiovascular Disease

## 2014-06-26 VITALS — BP 141/81 | HR 55 | Resp 16 | Ht 75.0 in | Wt 243.9 lb

## 2014-06-26 DIAGNOSIS — I483 Typical atrial flutter: Secondary | ICD-10-CM

## 2014-06-26 DIAGNOSIS — I350 Nonrheumatic aortic (valve) stenosis: Secondary | ICD-10-CM | POA: Diagnosis not present

## 2014-06-26 DIAGNOSIS — R0683 Snoring: Secondary | ICD-10-CM | POA: Diagnosis not present

## 2014-06-26 DIAGNOSIS — I4892 Unspecified atrial flutter: Secondary | ICD-10-CM

## 2014-06-26 DIAGNOSIS — Z01812 Encounter for preprocedural laboratory examination: Secondary | ICD-10-CM | POA: Diagnosis not present

## 2014-06-26 DIAGNOSIS — G4719 Other hypersomnia: Secondary | ICD-10-CM

## 2014-06-26 DIAGNOSIS — I499 Cardiac arrhythmia, unspecified: Secondary | ICD-10-CM

## 2014-06-26 HISTORY — DX: Unspecified atrial flutter: I48.92

## 2014-06-26 MED ORDER — APIXABAN 5 MG PO TABS: 5.0000 mg | ORAL_TABLET | Freq: Two times a day (BID) | ORAL | Status: DC

## 2014-06-26 NOTE — Progress Notes (Signed)
Patient ID: Gary [name], male   DOB: 16-Feb-1936, 79 y.o.   MRN: [id number]       Reason for office visit New onset atrial flutter  Gary [name] has no history of previous cardiac illness and his only known cardiac risk factor is treated hyperlipidemia (on statin). He has just been diagnosed with atrial flutter.  He exercises regularly. While on a trip to the [place] after exercising on the treadmill he returned to his room climbing 3 flights of stairs and felt slightly lightheaded and tired. The symptoms promptly improved when he stopped. He has also noticed some mild ankle edema since that time. He has been checking his blood pressure regularly. His heart rate has consistently been around 60 bpm. His blood pressure has been slowly climbing and has frequently been elevated just over the last 4-5 days. He has not previously had high blood pressure. He has a cardiac murmur, but has no serious valvular abnormalities by echo  During a routine medical visit with Dr. Sherren Mocha he was found to have atrial flutter by ECG.  A recent echocardiogram showed mild left atrial dilatation and trivial aortic insufficiency.  He has been told that he snores and scores intermediate on the Epworth Sleepiness Scale. He does not have a history of venous thromboembolic disease, is a nonsmoker and has no history of lung problems. He does not have angina pectoris and has never experienced syncope. He is not aware of any palpitations.  His only other medical problems include rosacea, erectile dysfunction and allergic rhinitis   No Known Allergies  Current Outpatient Prescriptions  Medication Sig Dispense Refill  . apixaban (ELIQUIS) 5 MG TABS tablet Take 1 tablet (5 mg total) by mouth 2 (two) times daily. 180 tablet 3  . aspirin 81 MG EC tablet Take 81 mg by mouth daily.      . fluticasone (FLONASE) 50 MCG/ACT nasal spray Place 2 sprays into both nostrils daily. 16 g 11  . minocycline (MINOCIN,DYNACIN) 100 MG  capsule Take 1 capsule (100 mg total) by mouth daily. 100 capsule 3  . sildenafil (VIAGRA) 100 MG tablet Take 0.5-1 tablets (50-100 mg total) by mouth daily as needed for erectile dysfunction. 10 tablet 11  . simvastatin (ZOCOR) 40 MG tablet TAKE 1 TABLET AT BEDTIME 90 tablet 3   No current facility-administered medications for this visit.    Past Medical History  Diagnosis Date  . ALLERGIC RHINITIS 10/03/2007  . ASTHMA, ACUTE 08/25/2009  . ERECTILE DYSFUNCTION 10/03/2007  . HYPERLIPIDEMIA 10/03/2007  . Rosacea   . CARDIAC MURMUR, AORTIC 10/03/2007  . Adenomatous colon polyp   . Atrial flutter 06/26/2014    Past Surgical History  Procedure Laterality Date  . Tonsillectomy    . Colonoscopy    . Adenoidectomy      Family History  Problem Relation Age of Onset  . Diabetes Mother   . Cancer Father     liver ca  . Liver cancer Father     History   Social History  . Marital Status: Married    Spouse Name: N/A  . Number of Children: N/A  . Years of Education: N/A   Occupational History  . Not on file.   Social History Main Topics  . Smoking status: Former Research scientist (life sciences)  . Smokeless tobacco: Never Used  . Alcohol Use: 4.8 oz/week    8 Glasses of wine per week  . Drug Use: No  . Sexual Activity: Not on file   Other Topics Concern  .  Not on file   Social History Narrative    Review of systems: The patient specifically denies any chest pain at rest or with exertion, dyspnea at rest or with exertion, orthopnea, paroxysmal nocturnal dyspnea, syncope, palpitations, focal neurological deficits, intermittent claudication, lower extremity edema, unexplained weight gain, cough, hemoptysis or wheezing.  The patient also denies abdominal pain, nausea, vomiting, dysphagia, diarrhea, constipation, polyuria, polydipsia, dysuria, hematuria, frequency, urgency, abnormal bleeding or bruising, fever, chills, unexpected weight changes, mood swings, change in skin or hair texture, change in voice  quality, auditory or visual problems, allergic reactions or rashes, new musculoskeletal complaints other than usual "aches and pains".   PHYSICAL EXAM BP 141/81 mmHg  Pulse 55  Resp 16  Ht 6\' 3"  (1.905 m)  Wt 243 lb 14.4 oz (110.632 kg)  BMI 30.49 kg/m2  General: Alert, oriented x3, no distress Head: no evidence of trauma, PERRL, EOMI, no exophtalmos or lid lag, no myxedema, no xanthelasma; normal ears, nose and oropharynx Neck: normal jugular venous pulsations and no hepatojugular reflux; brisk carotid pulses without delay and no carotid bruits Chest: clear to auscultation, no signs of consolidation by percussion or palpation, normal fremitus, symmetrical and full respiratory excursions Cardiovascular: normal position and quality of the apical impulse, regular rhythm, normal first and second heart sounds, grade 2/6 early peaking systolic aortic ejection murmur, no diastolic murmurs, rubs or gallops Abdomen: no tenderness or distention, no masses by palpation, no abnormal pulsatility or arterial bruits, normal bowel sounds, no hepatosplenomegaly Extremities: no clubbing, cyanosis or edema; 2+ radial, ulnar and brachial pulses bilaterally; 2+ right femoral, posterior tibial and dorsalis pedis pulses; 2+ left femoral, posterior tibial and dorsalis pedis pulses; no subclavian or femoral bruits Neurological: grossly nonfocal   EKG: Atrial flutter with predominantly 4-1 block, impossible to evaluate repolarization pattern due to very large voltage flutter waves  Lipid Panel     Component Value Date/Time   CHOL 205* 05/05/2014 0930   TRIG 95.0 05/05/2014 0930   HDL 50.00 05/05/2014 0930   CHOLHDL 4 05/05/2014 0930   VLDL 19.0 05/05/2014 0930   LDLCALC 136* 05/05/2014 0930   LDLDIRECT 143.5 01/22/2012 0916    BMET    Component Value Date/Time   NA 146* 05/05/2014 0930   K 6.2* 05/05/2014 0930   CL 110 05/05/2014 0930   CO2 29 05/05/2014 0930   GLUCOSE 121* 05/05/2014 0930   BUN 21  05/05/2014 0930   CREATININE 1.2 05/05/2014 0930   CALCIUM 9.4 05/05/2014 0930   GFRNONAA 93.02 10/19/2009 0000   GFRAA 123 09/18/2007 0848     ASSESSMENT AND PLAN  Gary [name] has mildly symptomatic atrial flutter with spontaneously controlled ventricular rate, does not have evidence of structural cardiac disease by physical exam or echocardiography. He has a low risk profile for coronary problems and no symptoms of CAD. He has some features that suggest possible obstructive sleep apnea but these are not particularly prominent.  At this point the most important intervention is protection from stroke with anticoagulants. CHADSVasc score is 2. Discussed warfarin versus a novel agents and he prefers the latter. We'll start treatment with apixaban 5 mg twice a day.  I think we should give a try at cardioversion and think the success of restoration of sinus rhythm is likely to be high. We discussed transesophageal echo with early cardioversion versus coming back for cardioversion after 4 weeks of full anticoagulation. He prefers a more speedy approach. The risks and benefits of TEE, cardioversion and moderate sedation  were discussed in detail and he wants to go ahead with the plan.  If he has early recurrence of atrial flutter consider electrophysiology referral for consideration of caval tricuspid isthmus ablation.  Schedule outpatient sleep study.  Orders Placed This Encounter  Procedures  . CBC  . Comprehensive metabolic panel  . Protime-INR  . APTT  . EKG 12-Lead  . TRANSESOPHAGEAL ECHOCARDIOGRAM WITH CARDIOVERSION  . Split night study   Meds ordered this encounter  Medications  . DISCONTD: apixaban (ELIQUIS) 5 MG TABS tablet    Sig: Take 1 tablet (5 mg total) by mouth 2 (two) times daily.    Dispense:  60 tablet    Refill:  1  . apixaban (ELIQUIS) 5 MG TABS tablet    Sig: Take 1 tablet (5 mg total) by mouth 2 (two) times daily.    Dispense:  180 tablet    Refill:  Pinon Sahana Boyland, MD, [facility] [phone] office [phone] pager

## 2014-06-26 NOTE — Patient Instructions (Addendum)
Your physician recommends that you return for lab work Monday February 29th.  Your physician has recommended that you have a sleep study. This test records several body functions during sleep, including: brain activity, eye movement, oxygen and carbon dioxide blood levels, heart rate and rhythm, breathing rate and rhythm, the flow of air through your mouth and nose, snoring, body muscle movements, and chest and belly movement. This will be done at [facility]. They will contact you with this appointment.  Your physician has requested that you have a TEE/Cardioversion. During a TEE, sound waves are used to create images of your heart. It provides your doctor with information about the size and shape of your heart and how well your heart's chambers and valves are working. In this test, a transducer is attached to the end of a flexible tube that is guided down you throat and into your esophagus (the tube leading from your mouth to your stomach) to get a more detailed image of your heart. Once the TEE has determined that a blood clot is not present, the cardioversion begins. Electrical Cardioversion uses a jolt of electricity to your heart either through paddles or wired patches attached to your chest. This is a controlled, usually prescheduled, procedure. This procedure is done at the hospital and you are not awake during the procedure. You usually go home the day of the procedure. Please see the instruction sheet given to you today for more information. This will be scheduled on March 2nd.  Your physician has recommended you make the following change in your medication: start new prescription for Eliquis.  Your physician recommends that you schedule a follow-up appointment in: 2 weeks following your procedure. This appointment will be given to you at the time of your discharge.

## 2014-06-26 NOTE — Telephone Encounter (Signed)
LM at home number to remind him to stop the aspirin since Eliquis was started today.  Cell number in computer is incorrect.

## 2014-06-29 DIAGNOSIS — Z01812 Encounter for preprocedural laboratory examination: Secondary | ICD-10-CM | POA: Diagnosis not present

## 2014-06-30 ENCOUNTER — Telehealth: Payer: Self-pay | Admitting: Cardiovascular Disease

## 2014-06-30 LAB — CBC
HEMATOCRIT: 48.8 % (ref 39.0–52.0)
HEMOGLOBIN: 16.7 g/dL (ref 13.0–17.0)
MCH: 32.9 pg (ref 26.0–34.0)
MCHC: 34.2 g/dL (ref 30.0–36.0)
MCV: 96.1 fL (ref 78.0–100.0)
MPV: 11.1 fL (ref 8.6–12.4)
Platelets: 149 10*3/uL — ABNORMAL LOW (ref 150–400)
RBC: 5.08 MIL/uL (ref 4.22–5.81)
RDW: 14.1 % (ref 11.5–15.5)
WBC: 6.3 10*3/uL (ref 4.0–10.5)

## 2014-06-30 LAB — PROTIME-INR
INR: 1.3 (ref <1.50)
PROTHROMBIN TIME: 16.2 s — AB (ref 11.6–15.2)

## 2014-06-30 LAB — COMPREHENSIVE METABOLIC PANEL
ALK PHOS: 60 U/L (ref 39–117)
ALT: 23 U/L (ref 0–53)
AST: 22 U/L (ref 0–37)
Albumin: 4 g/dL (ref 3.5–5.2)
BILIRUBIN TOTAL: 1.6 mg/dL — AB (ref 0.2–1.2)
BUN: 24 mg/dL — AB (ref 6–23)
CO2: 27 mEq/L (ref 19–32)
Calcium: 9 mg/dL (ref 8.4–10.5)
Chloride: 103 mEq/L (ref 96–112)
Creat: 0.9 mg/dL (ref 0.50–1.35)
Glucose, Bld: 109 mg/dL — ABNORMAL HIGH (ref 70–99)
Potassium: 4.9 mEq/L (ref 3.5–5.3)
Sodium: 138 mEq/L (ref 135–145)
Total Protein: 6.3 g/dL (ref 6.0–8.3)

## 2014-06-30 LAB — APTT: APTT: 34 s (ref 24–37)

## 2014-06-30 MED ORDER — SODIUM CHLORIDE 0.9 % IV SOLN
INTRAVENOUS | Status: DC
Start: 2014-06-30 — End: 2014-07-01

## 2014-06-30 MED ORDER — SODIUM CHLORIDE 0.9 % IV SOLN
INTRAVENOUS | Status: DC
  Administered 2014-07-01: 500 mL via INTRAVENOUS

## 2014-06-30 NOTE — Telephone Encounter (Signed)
A CPT code is needed for pt's surgery

## 2014-06-30 NOTE — Telephone Encounter (Signed)
Received a call from Iris with [facility] pre cert.She stated she needed ICD codes for patient's upcoming surgery.Reveiwed Dr.Croitoru's 06/28/14 office note and cpt codes given.

## 2014-07-01 ENCOUNTER — Ambulatory Visit (HOSPITAL_COMMUNITY): Payer: Medicare Other | Admitting: Certified Registered Nurse Anesthetist

## 2014-07-01 ENCOUNTER — Encounter (HOSPITAL_COMMUNITY)
Admission: RE | Disposition: A | Discharge status: Home / Self Care | Payer: Self-pay | Source: Ambulatory Visit | Attending: Cardiovascular Disease

## 2014-07-01 ENCOUNTER — Ambulatory Visit (HOSPITAL_COMMUNITY)
Admission: RE | Admit: 2014-07-01 | Discharge: 2014-07-01 | Disposition: A | Discharge status: Home / Self Care | Payer: Medicare Other | Source: Ambulatory Visit | Attending: Cardiovascular Disease | Admitting: Cardiovascular Disease

## 2014-07-01 ENCOUNTER — Encounter (HOSPITAL_COMMUNITY): Payer: Self-pay

## 2014-07-01 DIAGNOSIS — Z79899 Other long term (current) drug therapy: Secondary | ICD-10-CM | POA: Diagnosis not present

## 2014-07-01 DIAGNOSIS — I4892 Unspecified atrial flutter: Secondary | ICD-10-CM | POA: Diagnosis not present

## 2014-07-01 DIAGNOSIS — I499 Cardiac arrhythmia, unspecified: Secondary | ICD-10-CM | POA: Diagnosis not present

## 2014-07-01 DIAGNOSIS — Z7901 Long term (current) use of anticoagulants: Secondary | ICD-10-CM | POA: Diagnosis not present

## 2014-07-01 DIAGNOSIS — I483 Typical atrial flutter: Secondary | ICD-10-CM | POA: Insufficient documentation

## 2014-07-01 DIAGNOSIS — I340 Nonrheumatic mitral (valve) insufficiency: Secondary | ICD-10-CM | POA: Diagnosis not present

## 2014-07-01 DIAGNOSIS — J45909 Unspecified asthma, uncomplicated: Secondary | ICD-10-CM | POA: Insufficient documentation

## 2014-07-01 DIAGNOSIS — E785 Hyperlipidemia, unspecified: Secondary | ICD-10-CM | POA: Insufficient documentation

## 2014-07-01 DIAGNOSIS — Z87891 Personal history of nicotine dependence: Secondary | ICD-10-CM | POA: Insufficient documentation

## 2014-07-01 DIAGNOSIS — Z7982 Long term (current) use of aspirin: Secondary | ICD-10-CM | POA: Insufficient documentation

## 2014-07-01 HISTORY — PX: TEE WITHOUT CARDIOVERSION: SHX5443

## 2014-07-01 HISTORY — PX: CARDIOVERSION: SHX1299

## 2014-07-01 SURGERY — ECHOCARDIOGRAM, TRANSESOPHAGEAL
Anesthesia: Monitor Anesthesia Care

## 2014-07-01 MED ORDER — BUTAMBEN-TETRACAINE-BENZOCAINE 2-2-14 % EX AERO
INHALATION_SPRAY | CUTANEOUS | Status: DC | PRN
  Administered 2014-07-01: 2 via TOPICAL

## 2014-07-01 MED ORDER — PROPOFOL 10 MG/ML IV BOLUS
INTRAVENOUS | Status: DC | PRN
  Administered 2014-07-01: 50 mg via INTRAVENOUS
  Administered 2014-07-01: 20 mg via INTRAVENOUS
  Administered 2014-07-01: 40 mg via INTRAVENOUS
  Administered 2014-07-01: 20 mg via INTRAVENOUS

## 2014-07-01 MED ORDER — LIDOCAINE HCL (CARDIAC) 20 MG/ML IV SOLN
INTRAVENOUS | Status: DC | PRN
  Administered 2014-07-01: 80 mg via INTRAVENOUS

## 2014-07-01 MED ORDER — SODIUM CHLORIDE 0.9 % IV SOLN
INTRAVENOUS | Status: DC | PRN
  Administered 2014-07-01: 10:00:00 via INTRAVENOUS

## 2014-07-01 NOTE — Op Note (Signed)
INDICATIONS: atrial flutter precardioversion  PROCEDURE:   Informed consent was obtained prior to the procedure. The risks, benefits and alternatives for the procedure were discussed and the patient comprehended these risks.  Risks include, but are not limited to, cough, sore throat, vomiting, nausea, somnolence, esophageal and stomach trauma or perforation, bleeding, low blood pressure, aspiration, pneumonia, infection, trauma to the teeth and death.    After a procedural time-out, the oropharynx was anesthetized with 20% benzocaine spray. The patient was given IV propofol by Anesthesiology.   The transesophageal probe was inserted in the esophagus and stomach without difficulty and multiple views were obtained.  The patient was kept under observation until the patient left the procedure room.  The patient left the procedure room in stable condition.   Agitated microbubble saline contrast was not administered.  COMPLICATIONS:    There were no immediate complications.  FINDINGS:  Normal findings on TEE. No LA thrombus  RECOMMENDATIONS:   Proceed with cardioversion. Outpatient sleep study. Continue anticoagulant without interruption  Time Spent Directly with the Patient:  30 minutes   Melisa Donofrio 07/01/2014, 10:32 AM

## 2014-07-01 NOTE — Anesthesia Preprocedure Evaluation (Signed)
Anesthesia Evaluation  Patient identified by MRN, date of birth, ID band Patient awake    Reviewed: Allergy & Precautions, NPO status , Patient's Chart, lab work & pertinent test results  Airway Mallampati: II   Neck ROM: full    Dental   Pulmonary asthma , former smoker,          Cardiovascular + dysrhythmias Atrial Fibrillation + Valvular Problems/Murmurs AI  Trivial AI on TTE.   Neuro/Psych    GI/Hepatic   Endo/Other  obese  Renal/GU      Musculoskeletal   Abdominal   Peds  Hematology   Anesthesia Other Findings   Reproductive/Obstetrics                             Anesthesia Physical Anesthesia Plan  ASA: II  Anesthesia Plan: MAC   Post-op Pain Management:    Induction: Intravenous  Airway Management Planned: Nasal Cannula  Additional Equipment:   Intra-op Plan:   Post-operative Plan:   Informed Consent: I have reviewed the patients History and Physical, chart, labs and discussed the procedure including the risks, benefits and alternatives for the proposed anesthesia with the patient or authorized representative who has indicated his/her understanding and acceptance.     Plan Discussed with: CRNA, Anesthesiologist and Surgeon  Anesthesia Plan Comments:         Anesthesia Quick Evaluation

## 2014-07-01 NOTE — Anesthesia Postprocedure Evaluation (Signed)
  Anesthesia Post-op Note  Patient: Gary [name]  Procedure(s) Performed: Procedure(s): TRANSESOPHAGEAL ECHOCARDIOGRAM (TEE) (N/A) CARDIOVERSION (N/A)  Patient Location: PACU  Anesthesia Type:General  Level of Consciousness: awake and alert   Airway and Oxygen Therapy: Patient Spontanous Breathing  Post-op Pain: none  Post-op Assessment: Post-op Vital signs reviewed, Patient's Cardiovascular Status Stable and Respiratory Function Stable  Post-op Vital Signs: Reviewed  Filed Vitals:   07/01/14 1106  BP: 137/80  Pulse: 61  Temp:   Resp: 19    Complications: No apparent anesthesia complications

## 2014-07-01 NOTE — Progress Notes (Signed)
  Echocardiogram Echocardiogram Transesophageal has been performed.  Gary [name] 07/01/2014, 10:31 AM

## 2014-07-01 NOTE — Transfer of Care (Signed)
Immediate Anesthesia Transfer of Care Note  Patient: Gary [name]  Procedure(s) Performed: Procedure(s): TRANSESOPHAGEAL ECHOCARDIOGRAM (TEE) (N/A) CARDIOVERSION (N/A)  Patient Location: Endoscopy Unit  Anesthesia Type:MAC  Level of Consciousness: awake, alert , oriented and patient cooperative  Airway & Oxygen Therapy: Patient Spontanous Breathing and Patient connected to nasal cannula oxygen  Post-op Assessment: Report given to RN, Post -op Vital signs reviewed and stable and Patient moving all extremities  Post vital signs: Reviewed and stable  Last Vitals:  Filed Vitals:   07/01/14 0916  BP: 162/99  Pulse: 72  Temp: 36.5 C  Resp: 18    Complications: No apparent anesthesia complications

## 2014-07-01 NOTE — H&P (View-Only) (Signed)
Patient ID: Gary [name], male   DOB: 1936-02-14, 79 y.o.   MRN: [id number]       Reason for office visit New onset atrial flutter  Gary [name] has no history of previous cardiac illness and his only known cardiac risk factor is treated hyperlipidemia (on statin). He has just been diagnosed with atrial flutter.  He exercises regularly. While on a trip to the [place] after exercising on the treadmill he returned to his room climbing 3 flights of stairs and felt slightly lightheaded and tired. The symptoms promptly improved when he stopped. He has also noticed some mild ankle edema since that time. He has been checking his blood pressure regularly. His heart rate has consistently been around 60 bpm. His blood pressure has been slowly climbing and has frequently been elevated just over the last 4-5 days. He has not previously had high blood pressure. He has a cardiac murmur, but has no serious valvular abnormalities by echo  During a routine medical visit with Gary [name] he was found to have atrial flutter by ECG.  A recent echocardiogram showed mild left atrial dilatation and trivial aortic insufficiency.  He has been told that he snores and scores intermediate on the Epworth Sleepiness Scale. He does not have a history of venous thromboembolic disease, is a nonsmoker and has no history of lung problems. He does not have angina pectoris and has never experienced syncope. He is not aware of any palpitations.  His only other medical problems include rosacea, erectile dysfunction and allergic rhinitis   No Known Allergies  Current Outpatient Prescriptions  Medication Sig Dispense Refill  . apixaban (ELIQUIS) 5 MG TABS tablet Take 1 tablet (5 mg total) by mouth 2 (two) times daily. 180 tablet 3  . aspirin 81 MG EC tablet Take 81 mg by mouth daily.      . fluticasone (FLONASE) 50 MCG/ACT nasal spray Place 2 sprays into both nostrils daily. 16 g 11  . minocycline (MINOCIN,DYNACIN) 100 MG  capsule Take 1 capsule (100 mg total) by mouth daily. 100 capsule 3  . sildenafil (VIAGRA) 100 MG tablet Take 0.5-1 tablets (50-100 mg total) by mouth daily as needed for erectile dysfunction. 10 tablet 11  . simvastatin (ZOCOR) 40 MG tablet TAKE 1 TABLET AT BEDTIME 90 tablet 3   No current facility-administered medications for this visit.    Past Medical History  Diagnosis Date  . ALLERGIC RHINITIS 10/03/2007  . ASTHMA, ACUTE 08/25/2009  . ERECTILE DYSFUNCTION 10/03/2007  . HYPERLIPIDEMIA 10/03/2007  . Rosacea   . CARDIAC MURMUR, AORTIC 10/03/2007  . Adenomatous colon polyp   . Atrial flutter 06/26/2014    Past Surgical History  Procedure Laterality Date  . Tonsillectomy    . Colonoscopy    . Adenoidectomy      Family History  Problem Relation Age of Onset  . Diabetes Mother   . Cancer Father     liver ca  . Liver cancer Father     History   Social History  . Marital Status: Married    Spouse Name: N/A  . Number of Children: N/A  . Years of Education: N/A   Occupational History  . Not on file.   Social History Main Topics  . Smoking status: Former Research scientist (life sciences)  . Smokeless tobacco: Never Used  . Alcohol Use: 4.8 oz/week    8 Glasses of wine per week  . Drug Use: No  . Sexual Activity: Not on file   Other Topics Concern  .  Not on file   Social History Narrative    Review of systems: The patient specifically denies any chest pain at rest or with exertion, dyspnea at rest or with exertion, orthopnea, paroxysmal nocturnal dyspnea, syncope, palpitations, focal neurological deficits, intermittent claudication, lower extremity edema, unexplained weight gain, cough, hemoptysis or wheezing.  The patient also denies abdominal pain, nausea, vomiting, dysphagia, diarrhea, constipation, polyuria, polydipsia, dysuria, hematuria, frequency, urgency, abnormal bleeding or bruising, fever, chills, unexpected weight changes, mood swings, change in skin or hair texture, change in voice  quality, auditory or visual problems, allergic reactions or rashes, new musculoskeletal complaints other than usual "aches and pains".   PHYSICAL EXAM BP 141/81 mmHg  Pulse 55  Resp 16  Ht 6\' 3"  (1.905 m)  Wt 243 lb 14.4 oz (110.632 kg)  BMI 30.49 kg/m2  General: Alert, oriented x3, no distress Head: no evidence of trauma, PERRL, EOMI, no exophtalmos or lid lag, no myxedema, no xanthelasma; normal ears, nose and oropharynx Neck: normal jugular venous pulsations and no hepatojugular reflux; brisk carotid pulses without delay and no carotid bruits Chest: clear to auscultation, no signs of consolidation by percussion or palpation, normal fremitus, symmetrical and full respiratory excursions Cardiovascular: normal position and quality of the apical impulse, regular rhythm, normal first and second heart sounds, grade 2/6 early peaking systolic aortic ejection murmur, no diastolic murmurs, rubs or gallops Abdomen: no tenderness or distention, no masses by palpation, no abnormal pulsatility or arterial bruits, normal bowel sounds, no hepatosplenomegaly Extremities: no clubbing, cyanosis or edema; 2+ radial, ulnar and brachial pulses bilaterally; 2+ right femoral, posterior tibial and dorsalis pedis pulses; 2+ left femoral, posterior tibial and dorsalis pedis pulses; no subclavian or femoral bruits Neurological: grossly nonfocal   EKG: Atrial flutter with predominantly 4-1 block, impossible to evaluate repolarization pattern due to very large voltage flutter waves  Lipid Panel     Component Value Date/Time   CHOL 205* 05/05/2014 0930   TRIG 95.0 05/05/2014 0930   HDL 50.00 05/05/2014 0930   CHOLHDL 4 05/05/2014 0930   VLDL 19.0 05/05/2014 0930   LDLCALC 136* 05/05/2014 0930   LDLDIRECT 143.5 01/22/2012 0916    BMET    Component Value Date/Time   NA 146* 05/05/2014 0930   K 6.2* 05/05/2014 0930   CL 110 05/05/2014 0930   CO2 29 05/05/2014 0930   GLUCOSE 121* 05/05/2014 0930   BUN 21  05/05/2014 0930   CREATININE 1.2 05/05/2014 0930   CALCIUM 9.4 05/05/2014 0930   GFRNONAA 93.02 10/19/2009 0000   GFRAA 123 09/18/2007 0848     ASSESSMENT AND PLAN  Gary [name] has mildly symptomatic atrial flutter with spontaneously controlled ventricular rate, does not have evidence of structural cardiac disease by physical exam or echocardiography. He has a low risk profile for coronary problems and no symptoms of CAD. He has some features that suggest possible obstructive sleep apnea but these are not particularly prominent.  At this point the most important intervention is protection from stroke with anticoagulants. CHADSVasc score is 2. Discussed warfarin versus a novel agents and he prefers the latter. We'll start treatment with apixaban 5 mg twice a day.  I think we should give a try at cardioversion and think the success of restoration of sinus rhythm is likely to be high. We discussed transesophageal echo with early cardioversion versus coming back for cardioversion after 4 weeks of full anticoagulation. He prefers a more speedy approach. The risks and benefits of TEE, cardioversion and moderate sedation  were discussed in detail and he wants to go ahead with the plan.  If he has early recurrence of atrial flutter consider electrophysiology referral for consideration of caval tricuspid isthmus ablation.  Schedule outpatient sleep study.  Orders Placed This Encounter  Procedures  . CBC  . Comprehensive metabolic panel  . Protime-INR  . APTT  . EKG 12-Lead  . TRANSESOPHAGEAL ECHOCARDIOGRAM WITH CARDIOVERSION  . Split night study   Meds ordered this encounter  Medications  . DISCONTD: apixaban (ELIQUIS) 5 MG TABS tablet    Sig: Take 1 tablet (5 mg total) by mouth 2 (two) times daily.    Dispense:  60 tablet    Refill:  1  . apixaban (ELIQUIS) 5 MG TABS tablet    Sig: Take 1 tablet (5 mg total) by mouth 2 (two) times daily.    Dispense:  180 tablet    Refill:  [place] Kina Shiffman, MD, [facility] [phone] office [phone] pager

## 2014-07-01 NOTE — Interval H&P Note (Signed)
History and Physical Interval Note:  07/01/2014 9:16 AM  Gary [name]  has presented today for surgery, with the diagnosis of CARDIAC ARRHYTHMIA   The various methods of treatment have been discussed with the patient and family. After consideration of risks, benefits and other options for treatment, the patient has consented to  Procedure(s): TRANSESOPHAGEAL ECHOCARDIOGRAM (TEE) (N/A) CARDIOVERSION (N/A) as a surgical intervention .  The patient's history has been reviewed, patient examined, no change in status, stable for surgery.  I have reviewed the patient's chart and labs.  Questions were answered to the patient's satisfaction.     Fara Worthy

## 2014-07-01 NOTE — Discharge Instructions (Signed)
Conscious Sedation, Adult, Care After °Refer to this sheet in the next few weeks. These instructions provide you with information on caring for yourself after your procedure. Your health care provider may also give you more specific instructions. Your treatment has been planned according to current medical practices, but problems sometimes occur. Call your health care provider if you have any problems or questions after your procedure. °WHAT TO EXPECT AFTER THE PROCEDURE  °After your procedure: °· You may feel sleepy, clumsy, and have poor balance for several hours. °· Vomiting may occur if you eat too soon after the procedure. °HOME CARE INSTRUCTIONS °· Do not participate in any activities where you could become injured for at least 24 hours. Do not: °¨ Drive. °¨ Swim. °¨ Ride a bicycle. °¨ Operate heavy machinery. °¨ Cook. °¨ Use power tools. °¨ Climb ladders. °¨ Work from a high place. °· Do not make important decisions or sign legal documents until you are improved. °· If you vomit, drink water, juice, or soup when you can drink without vomiting. Make sure you have little or no nausea before eating solid foods. °· Only take over-the-counter or prescription medicines for pain, discomfort, or fever as directed by your health care provider. °· Make sure you and your family fully understand everything about the medicines given to you, including what side effects may occur. °· You should not drink alcohol, take sleeping pills, or take medicines that cause drowsiness for at least 24 hours. °· If you smoke, do not smoke without supervision. °· If you are feeling better, you may resume normal activities 24 hours after you were sedated. °· Keep all appointments with your health care provider. °SEEK MEDICAL CARE IF: °· Your skin is pale or bluish in color. °· You continue to feel nauseous or vomit. °· Your pain is getting worse and is not helped by medicine. °· You have bleeding or swelling. °· You are still sleepy or  feeling clumsy after 24 hours. °SEEK IMMEDIATE MEDICAL CARE IF: °· You develop a rash. °· You have difficulty breathing. °· You develop any type of allergic problem. °· You have a fever. °MAKE SURE YOU: °· Understand these instructions. °· Will watch your condition. °· Will get help right away if you are not doing well or get worse. °Document Released: 02/05/2013 Document Reviewed: 02/05/2013 °ExitCare® Patient Information ©2015 [employer]. This information is not intended to replace advice given to you by your health care provider. Make sure you discuss any questions you have with your health care provider. °  °

## 2014-07-01 NOTE — CV Procedure (Signed)
Procedure: Electrical Cardioversion Indications:  Atrial Flutter  Procedure Details:  Consent: Risks of procedure as well as the alternatives and risks of each were explained to the (patient/caregiver).  Consent for procedure obtained.  Time Out: Verified patient identification, verified procedure, site/side was marked, verified correct patient position, special equipment/implants available, medications/allergies/relevent history reviewed, required imaging and test results available.  Performed  Patient placed on cardiac monitor, pulse oximetry, supplemental oxygen as necessary.  Sedation given: iv propofol Pacer pads placed anterior and posterior chest.  Cardioverted 1 time(s).  Cardioverted at 100J synchronized biphasic.  Evaluation: Findings: Post procedure EKG shows: NSR Complications: None Patient did tolerate procedure well.  Time Spent Directly with the Patient:  30 minutes   Gary [name] 07/01/2014, 10:33 AM

## 2014-07-02 ENCOUNTER — Encounter (HOSPITAL_COMMUNITY): Payer: Self-pay | Admitting: Cardiovascular Disease

## 2014-08-31 ENCOUNTER — Telehealth: Payer: Self-pay | Admitting: Cardiovascular Disease

## 2014-09-01 NOTE — Telephone Encounter (Signed)
Close encounter 

## 2014-09-02 ENCOUNTER — Encounter: Payer: Self-pay | Admitting: Gastroenterology

## 2014-09-07 ENCOUNTER — Ambulatory Visit (HOSPITAL_BASED_OUTPATIENT_CLINIC_OR_DEPARTMENT_OTHER): Payer: Medicare Other | Attending: Cardiovascular Disease

## 2014-09-07 VITALS — Ht 75.0 in | Wt 230.0 lb

## 2014-09-07 DIAGNOSIS — G4733 Obstructive sleep apnea (adult) (pediatric): Secondary | ICD-10-CM | POA: Diagnosis not present

## 2014-09-07 DIAGNOSIS — G4719 Other hypersomnia: Secondary | ICD-10-CM

## 2014-09-07 DIAGNOSIS — I4892 Unspecified atrial flutter: Secondary | ICD-10-CM

## 2014-09-07 DIAGNOSIS — R0683 Snoring: Secondary | ICD-10-CM

## 2014-09-15 NOTE — Sleep Study (Signed)
   NAME: Gary [name] DATE OF BIRTH:  1936/04/20 MEDICAL RECORD NUMBER [id number]  LOCATION: [facility]  PHYSICIAN: KELLY,THOMAS A  DATE OF STUDY: 09/07/2014  SLEEP STUDY TYPE: Diagnostic polysomnogram               REFERRING PHYSICIAN: Croitoru, Mihai, MD  INDICATION FOR STUDY: [name] is a 79 year old male who is referred for sleep study to evaluate for obstructive sleep apnea.  He complains of snoring, nonrestorative sleep, and has a history of atrial flutter, and aortic stenosis.  EPWORTH SLEEPINESS SCORE:  2 HEIGHT: 6\' 3"  (190.5 cm)  WEIGHT: 230 lb (104.327 kg)    Body mass index is 28.75 kg/(m^2).  NECK SIZE: 17.5 in.  MEDICATIONS:   simvastatin (ZOCOR) 40 MG tablet sildenafil (VIAGRA) 100 MG tablet 50-100 mg, Daily PRN Multiple Vitamin (MULTIVITAMIN) tablet 1 tablet, Daily minocycline (MINOCIN,DYNACIN) 100 MG capsule 100 mg, Daily fluticasone (FLONASE) 50 MCG/ACT nasal spray 2 spray, Daily co-enzyme Q-10 30 MG capsule 30 mg, Daily apixaban (ELIQUIS) 5 MG TABS tablet 5 mg, 2 times    SLEEP ARCHITECTURE:  The patient slept for 199.5 minutes out of a total recording time of 364 minutes, giving a percent sleep efficiency reduced at 54.8%.  Latency to sleep onset was prolonged at 36 minutes.  Latency to REM sleep was 52 minutes.  The patient spent 59 minutes in stage I (29.6%), 111 minutes in stage II (55.6%) 0 minutes in stage III and 29.5 minutes in REM sleep (14.8%).  There was mild snoring.  There were 46 arousals with an index of 13.8.  RESPIRATORY DATA:  During the sleep time, there were 2 obstructive apneas, 0 central apneas, 0 mixed apneas, and 51 hypopneas. The apne hypopnea  index (AHI) was 15.9 per hour.  The respiratory disturbance index  (RDI) was 20.5 per hour.  This places the patient in the category of moderate sleep apnea.  The AHI during REM sleep was 22.4 per hour.   OXYGEN DATA:  The baseline oxygen saturation was 98%.  The lowest  oxygen saturation in non-REM sleep was 90% and in REM sleep 89%.  CARDIAC DATA:  The patient was in atrial flutter during the procedure with occasional PVCs.  Average heart rate was 65 bpm.  MOVEMENT/PARASOMNIA:  0 periodic limb movements  IMPRESSION/ RECOMMENDATION:   Moderate obstructive sleep apnea/hypoxia syndrome.  Events were worse both with the supine position and during REM sleep. Reduced sleep efficiency.  Respiratory events with an oxygen desaturation to 89%. Mild snoring. The arousal index was abnormal.  CPAP titration is recommended in light of the severity.  The patient sleep apnea/hypoxia syndrome and his symptomatic nature in this patient with cardiovascular comorbidities. Effort should be made to optimize nasal and oral pharyngeal patency.  The patient should be counseled in both good slight sleep hygiene procedures and weight loss. The patient should be counseled to try avoiding sleep in the supine position.    [place], American Board of Sleep Medicine  ELECTRONICALLY SIGNED ON:  09/15/2014, 12:41 PM [place] PH: [phone]   FX: [phone] [place]

## 2014-09-15 NOTE — Addendum Note (Signed)
Addended by: Shelva Majestic A on: 09/15/2014 12:58 PM   Modules accepted: Level of Service

## 2014-09-18 ENCOUNTER — Telehealth: Payer: Self-pay | Admitting: *Deleted

## 2014-09-18 DIAGNOSIS — G4733 Obstructive sleep apnea (adult) (pediatric): Secondary | ICD-10-CM

## 2014-09-18 NOTE — Telephone Encounter (Signed)
Called and notified patient of Sleep study results and recommendations. Patient voiced understanding.he was given the number to [facility] to call if he has not heard from them about his titration study by the end of next week.

## 2014-09-18 NOTE — Progress Notes (Signed)
Patient notified of results. CPAP titration study ordered.

## 2014-09-21 ENCOUNTER — Encounter: Payer: Self-pay | Admitting: Cardiovascular Disease

## 2014-10-08 ENCOUNTER — Ambulatory Visit (HOSPITAL_BASED_OUTPATIENT_CLINIC_OR_DEPARTMENT_OTHER): Payer: Medicare Other | Attending: Cardiovascular Disease | Admitting: *Deleted

## 2014-10-08 VITALS — Ht 75.0 in | Wt 230.0 lb

## 2014-10-08 DIAGNOSIS — G4733 Obstructive sleep apnea (adult) (pediatric): Secondary | ICD-10-CM | POA: Diagnosis not present

## 2014-10-08 DIAGNOSIS — I483 Typical atrial flutter: Secondary | ICD-10-CM

## 2014-10-11 NOTE — Sleep Study (Signed)
   NAME: ANNA LIVERS DATE OF BIRTH:  [birth year] MEDICAL RECORD NUMBER [id number]  LOCATION: [facility]  PHYSICIAN: Breckin Zafar A  DATE OF STUDY: 10/08/2014  SLEEP STUDY TYPE: Positive Airway Pressure Titration               REFERRING PHYSICIAN: Troy Sine, MD  INDICATION FOR STUDY:  Mr. Waymire is a 79 year old male who has a history of snoring, nonrestorative sleep, as well as atrial flutter and aortic stenosis.  He has been demonstrated to have moderate OSA with AHI 15.9/hr overall and 20.5/hr during REM sleep and is referred for CPAP titration.  EPWORTH SLEEPINESS SCORE:  2 HEIGHT: 6\' 3"  (190.5 cm)  WEIGHT: 104.327 kg (230 lb)    Body mass index is 28.75 kg/(m^2).  NECK SIZE: 17.5 in.  MEDICATIONS:  apixaban (ELIQUIS) 5 MG TABS tablet 5 mg, 2 times daily co-enzyme Q-10 30 MG capsule 30 mg, Daily fluticasone (FLONASE) 50 MCG/ACT nasal spray 2 spray, Daily minocycline (MINOCIN,DYNACIN) 100 MG capsule 100 mg, Daily Multiple Vitamin (MULTIVITAMIN) tablet 1 tablet, Daily sildenafil (VIAGRA) 100 MG tablet 50-100 mg, Daily PRN simvastatin (ZOCOR) 40 MG tablet   SLEEP ARCHITECTURE:  Total recording time was 367.5 minutes with a total sleep time of 219.5 minutes.  Percent sleep efficiency was reduced at 59.7%.  Latency to sleep onset was prolonged at 36.5 minutes.  Latency to REM sleep was 114.5 minutes.  The patient slept for 24 minutes (10.9% in stage I, 150 minutes (68.3% in stage II, 0 minutes in stage III, and 45.5 minutes (20.7%) in REM sleep.   RESPIRATORY DATA:  CPAP was started at 5  cm water pressure and was titrated to 9 cm.  AHI was 0 at 9 cm water pressure.  He did well with nasal pillow mask, but required chin strap for oral venting with elimination of snore and venting.  OXYGEN DATA:  Baseline oxygen saturation was 97%.  The oxygen nadir was 83% with non-REM sleep initially. With CPAP therapy, the lowest oxygen saturation in REM sleep was 95%.     CARDIAC DATA:  The patient was in atrial flutter with variable block throughout the evening.  MOVEMENT/PARASOMNIA:  There were a total of 72 periodic limb movements with an index of 19.7.  There was 1 periodic limb movement arousal with index of 0.3.  IMPRESSION/ RECOMMENDATION:   Previously demonstrated moderate obstructive sleep apnea/hypopnea syndrome. Excellent response to CPAP therapy with the recommended initial CPAP pressure at 9 cm water pressure with an EPR of 3 and  heated humidification.  A nasal pillow mask with chinstrap is recommended due to oral venting without the chinstrap.  Recommend download be obtained in 30 days in sleep clinic evaluation.  If patient is symptomatic with restless legs, consider a trial of medical therapy with a PLMS index of 19.7.  [place], American Board of Sleep Medicine  ELECTRONICALLY SIGNED ON:  10/11/2014, 1:39 PM [place] PH: [phone]   FX: [phone] [place]

## 2014-10-11 NOTE — Addendum Note (Signed)
Addended by: Shelva Majestic A on: 10/11/2014 02:01 PM   Modules accepted: Level of Service

## 2014-10-14 ENCOUNTER — Telehealth: Payer: Self-pay | Admitting: *Deleted

## 2014-10-14 NOTE — Progress Notes (Signed)
Referred to [facility] for CPAP set up.

## 2014-10-14 NOTE — Telephone Encounter (Signed)
Left message for patient to return a call. ( inform of CPAP referral.)

## 2014-10-15 ENCOUNTER — Telehealth: Payer: Self-pay | Admitting: Cardiovascular Disease

## 2014-10-15 NOTE — Telephone Encounter (Signed)
Pt says that Mariann Laster called him yesterday in regards to his sleep study he had done. Please f/u with him  Thanks

## 2014-10-15 NOTE — Telephone Encounter (Signed)
Message sent to [facility].

## 2014-10-16 ENCOUNTER — Ambulatory Visit: Payer: BLUE CROSS/BLUE SHIELD | Admitting: Cardiovascular Disease

## 2014-10-16 NOTE — Telephone Encounter (Signed)
Returning your call from 2 days ago.

## 2014-10-16 NOTE — Telephone Encounter (Signed)
Returned a call to patient informing him that I did a referral to [facility] for him to receive CPAP machine a therapy. Once they have verified his insurance benefits they will contact him for set up. Patient voice verbal understanding of this.

## 2014-10-27 ENCOUNTER — Telehealth: Payer: Self-pay

## 2014-10-27 DIAGNOSIS — M25562 Pain in left knee: Secondary | ICD-10-CM

## 2014-10-27 NOTE — Telephone Encounter (Signed)
Referral placed for [facility]

## 2014-10-27 NOTE — Telephone Encounter (Signed)
Pt called requesting to speak with Dr. Honor Junes nurse.  Pt states he needs a referral to [facility] to see Dr. Theda Sers or Dr. Maureen Ralphs.  Pt states he has 'tweak ed" his left knee.  Pt states this happened 2 to 3 weeks ago and he played in a golf tournament and it has gotten worse.  Pt leaves going to the beach November 01, 2014 and would like to see orthopedics before then.  Advised pt that this may not be possible and another option may be [facility] after hours clinic.  Pt verbalized understanding and is aware that this message will be sent to [facility].

## 2014-10-30 ENCOUNTER — Telehealth: Payer: Self-pay | Admitting: *Deleted

## 2014-10-30 DIAGNOSIS — M25562 Pain in left knee: Secondary | ICD-10-CM | POA: Diagnosis not present

## 2014-10-30 NOTE — Telephone Encounter (Signed)
Faxed CPAP order to [facility].

## 2014-11-04 ENCOUNTER — Ambulatory Visit: Payer: BLUE CROSS/BLUE SHIELD | Admitting: Cardiovascular Disease

## 2014-12-07 ENCOUNTER — Ambulatory Visit (INDEPENDENT_AMBULATORY_CARE_PROVIDER_SITE_OTHER): Payer: Medicare Other | Admitting: Cardiovascular Disease

## 2014-12-07 ENCOUNTER — Encounter: Payer: Self-pay | Admitting: Cardiovascular Disease

## 2014-12-07 VITALS — BP 148/84 | HR 56 | Resp 15 | Ht 75.0 in | Wt 243.5 lb

## 2014-12-07 DIAGNOSIS — I350 Nonrheumatic aortic (valve) stenosis: Secondary | ICD-10-CM

## 2014-12-07 DIAGNOSIS — I483 Typical atrial flutter: Secondary | ICD-10-CM

## 2014-12-07 NOTE — Progress Notes (Signed)
Patient ID: Gary [name], male   DOB: [birth year], 79 y.o.   MRN: [id number]      Cardiology Office Note   Date:  12/08/2014   ID:  Gary [name], DOB [birth year], MRN [id number]  PCP:  Joycelyn Man, MD  Cardiologist:   [name], MD   Chief Complaint  Patient presents with  . Follow-up    patient reports no complaints.      History of Present Illness: Gary [name] is a 79 y.o. male who presents for follow-up for atrial flutter.  Last year he underwent T guided cardioversion for atrial flutter with successful return to sinus rhythm. Today he returns in atrial flutter and is completely oblivious to the arrhythmia. He has spontaneously controlled ventricular rate due to 4:1 AV conduction. The EKG seems to show typical counterclockwise flutter. He does not have a history of stroke/TIA or other embolic events and is tolerating treatment with apixaban without any bleeding complications. Statin for hyperlipidemia. 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 A recent sleep study confirmed the presence of obstructive sleep apnea and has recently started treatment with CPAP, after being evaluated by Dr. Ellouise Newer.    Past Medical History  Diagnosis Date  . ALLERGIC RHINITIS 10/03/2007  . ASTHMA, ACUTE 08/25/2009  . ERECTILE DYSFUNCTION 10/03/2007  . HYPERLIPIDEMIA 10/03/2007  . Rosacea   . CARDIAC MURMUR, AORTIC 10/03/2007  . Adenomatous colon polyp   . Atrial flutter 06/26/2014    Past Surgical History  Procedure Laterality Date  . Tonsillectomy    . Colonoscopy    . Adenoidectomy    . Tee without cardioversion N/A 07/01/2014    Procedure: TRANSESOPHAGEAL ECHOCARDIOGRAM (TEE);  Surgeon: [name], MD;  Location: [facility];  Service: Cardiovascular;  Laterality: N/A;  . Cardioversion N/A 07/01/2014    Procedure: CARDIOVERSION;  Surgeon: [name], MD;  Location: [facility];  Service: Cardiovascular;  Laterality: N/A;     Current Outpatient  Prescriptions  Medication Sig Dispense Refill  . apixaban (ELIQUIS) 5 MG TABS tablet Take 1 tablet (5 mg total) by mouth 2 (two) times daily. 180 tablet 3  . co-enzyme Q-10 30 MG capsule Take 30 mg by mouth daily.    . fluticasone (FLONASE) 50 MCG/ACT nasal spray Place 2 sprays into both nostrils daily. 16 g 11  . minocycline (MINOCIN,DYNACIN) 100 MG capsule Take 1 capsule (100 mg total) by mouth daily. 100 capsule 3  . Multiple Vitamin (MULTIVITAMIN) tablet Take 1 tablet by mouth daily.    . sildenafil (VIAGRA) 100 MG tablet Take 0.5-1 tablets (50-100 mg total) by mouth daily as needed for erectile dysfunction. 10 tablet 11  . simvastatin (ZOCOR) 40 MG tablet TAKE 1 TABLET AT BEDTIME 90 tablet 3   No current facility-administered medications for this visit.    Allergies:   Review of patient's allergies indicates no known allergies.    Social History:  The patient  reports that he has quit smoking. He has never used smokeless tobacco. He reports that he drinks about 4.8 oz of alcohol per week. He reports that he does not use illicit drugs.   Family History:  The patient's family history includes Cancer in his father; Diabetes in his mother; Liver cancer in his father.    ROS:  Please see the history of present illness.    Otherwise, review of systems positive for none.   All other systems are reviewed and negative.    PHYSICAL EXAM: VS:  BP 148/84 mmHg  Pulse 56  Resp 15  Ht 6\' 3"  (1.905 m)  Wt 243 lb 8 oz (110.451 kg)  BMI 30.44 kg/m2 , BMI Body mass index is 30.44 kg/(m^2).  General: Alert, oriented x3, no distress Head: no evidence of trauma, PERRL, EOMI, no exophtalmos or lid lag, no myxedema, no xanthelasma; normal ears, nose and oropharynx Neck: normal jugular venous pulsations and no hepatojugular reflux; brisk carotid pulses without delay and no carotid bruits Chest: clear to auscultation, no signs of consolidation by percussion or palpation, normal fremitus, symmetrical  and full respiratory excursions Cardiovascular: normal position and quality of the apical impulse, regular rhythm, normal first and second heart sounds, grade 2/6 early peaking aortic ejection systolic murmurs, no rubs or gallops Abdomen: no tenderness or distention, no masses by palpation, no abnormal pulsatility or arterial bruits, normal bowel sounds, no hepatosplenomegaly Extremities: no clubbing, cyanosis or edema; 2+ radial, ulnar and brachial pulses bilaterally; 2+ right femoral, posterior tibial and dorsalis pedis pulses; 2+ left femoral, posterior tibial and dorsalis pedis pulses; no subclavian or femoral bruits Neurological: grossly nonfocal Psych: euthymic mood, full affect   EKG:  EKG is ordered today. The ekg ordered today demonstrates atrial flutter with 4:1 AV block   Recent Labs: 05/05/2014: TSH 3.56 06/29/2014: ALT 23; BUN 24*; Creat 0.90; Hemoglobin 16.7; Platelets 149*; Potassium 4.9; Sodium 138    Lipid Panel    Component Value Date/Time   CHOL 205* 05/05/2014 0930   TRIG 95.0 05/05/2014 0930   HDL 50.00 05/05/2014 0930   CHOLHDL 4 05/05/2014 0930   VLDL 19.0 05/05/2014 0930   LDLCALC 136* 05/05/2014 0930   LDLDIRECT 143.5 01/22/2012 0916      Wt Readings from Last 3 Encounters:  12/07/14 243 lb 8 oz (110.451 kg)  10/08/14 230 lb (104.327 kg)  09/07/14 230 lb (104.327 kg)     ASSESSMENT AND PLAN:  Mr. Dobie had successful cardioversion for atrial flutter roughly 6 months ago, but has slipped back into atrial flutter was spontaneously controlled ventricular rate and seems to be completely oblivious to the arrhythmia. He is tolerating anticoagulation therapy without any complications. Since he appears to be completely oblivious to the arrhythmia, aggressive treatment with antiarrhythmic drugs and/or ablation does not appear to be really justified. Theoretically, radiofrequency ablation might be associated with eradication of atrial flutter and therefore no need  for anticoagulation. However the most significant abnormality on his echocardiogram is the presence of left atrial dilatation, suggesting he is at risk for development of atrial fibrillation even after flutter successfully ablated. Therefore, I don't think he would necessarily benefit from a successful ablation procedure.  The fact that he has spontaneous ventricular rate control suggests that he has AV node conduction disease and might need a pacemaker in the future. I think ablation procedures and/or antiarrhythmics might actually precipitate the need for a pacemaker.  In view of these considerations I think the best course of treatment remains conservative, continuing anticoagulation but not trying to restore normal rhythm.   Current medicines are reviewed at length with the patient today.  The patient does not have concerns regarding medicines.  The following changes have been made:  no change  Labs/ tests ordered today include:  Orders Placed This Encounter  Procedures  . EKG 12-Lead    Patient Instructions  Your physician wants you to follow-up in: [place] will receive a reminder letter in the mail two months in advance. If you don't receive a letter, please call  our office to schedule the follow-up appointment.   Mikael Spray, MD  12/08/2014 5:46 PM    [name], MD, [facility] [phone] office [phone] pager

## 2014-12-07 NOTE — Patient Instructions (Signed)
Your physician wants you to follow-up in: [place] will receive a reminder letter in the mail two months in advance. If you don't receive a letter, please call our office to schedule the follow-up appointment.

## 2014-12-08 ENCOUNTER — Encounter: Payer: Self-pay | Admitting: Cardiovascular Disease

## 2014-12-22 ENCOUNTER — Other Ambulatory Visit: Payer: Self-pay | Admitting: Family Medicine

## 2014-12-29 ENCOUNTER — Encounter: Payer: Self-pay | Admitting: Cardiovascular Disease

## 2014-12-29 DIAGNOSIS — L821 Other seborrheic keratosis: Secondary | ICD-10-CM | POA: Diagnosis not present

## 2014-12-29 DIAGNOSIS — D485 Neoplasm of uncertain behavior of skin: Secondary | ICD-10-CM | POA: Diagnosis not present

## 2014-12-29 DIAGNOSIS — D0472 Carcinoma in situ of skin of left lower limb, including hip: Secondary | ICD-10-CM | POA: Diagnosis not present

## 2014-12-29 DIAGNOSIS — Z85828 Personal history of other malignant neoplasm of skin: Secondary | ICD-10-CM | POA: Diagnosis not present

## 2014-12-30 ENCOUNTER — Ambulatory Visit (INDEPENDENT_AMBULATORY_CARE_PROVIDER_SITE_OTHER): Payer: Medicare Other | Admitting: Cardiovascular Disease

## 2014-12-30 VITALS — BP 158/88 | HR 77 | Ht 75.0 in | Wt 244.4 lb

## 2014-12-30 DIAGNOSIS — G4733 Obstructive sleep apnea (adult) (pediatric): Secondary | ICD-10-CM | POA: Diagnosis not present

## 2014-12-30 DIAGNOSIS — I350 Nonrheumatic aortic (valve) stenosis: Secondary | ICD-10-CM | POA: Diagnosis not present

## 2014-12-30 DIAGNOSIS — G4761 Periodic limb movement disorder: Secondary | ICD-10-CM

## 2014-12-30 DIAGNOSIS — I483 Typical atrial flutter: Secondary | ICD-10-CM

## 2014-12-30 NOTE — Patient Instructions (Signed)
Your physician wants you to follow-up in: 1 year or sooner in a sleep clinic with Dr. Dow Adolph will receive a reminder letter in the mail two months in advance. If you don't receive a letter, please call our office to schedule the follow-up appointment.

## 2015-01-01 ENCOUNTER — Encounter: Payer: Self-pay | Admitting: Cardiovascular Disease

## 2015-01-01 DIAGNOSIS — G4761 Periodic limb movement disorder: Secondary | ICD-10-CM | POA: Insufficient documentation

## 2015-01-01 DIAGNOSIS — G4733 Obstructive sleep apnea (adult) (pediatric): Secondary | ICD-10-CM | POA: Insufficient documentation

## 2015-01-01 NOTE — Progress Notes (Signed)
Patient ID: Gary [name], male   DOB: [birth year], 79 y.o.   MRN: [id number]     HPI: Gary [name], is a 79 y.o. male who presents to sleep clinic after initiation of CPAP therapy for recently diagnosed obstructive sleep apnea.  Gary [name] is a 79 year old male who is a patient of Dr. Sallyanne Kuster.  He is status post cardioversion for atrial flutter, and has a history of hyperlipidemia and aortic valve sclerosis.  He was referred for a sleep study which was done in May 2016 which revealed moderate obstructive sleep apnea with an AHI of 15.9 overall and 20.5 per hour during REM sleep.  He had reduced sleep efficiency at 54.8%.  Latency to sleep onset was prolonged at 36 minutes.  There was mild snoring.  Oxygen desaturated to 89%.  He underwent a CPAP titration trial and adnexal response to CPAP therapy with the initial recommended CPAP pressure 9 cm water pressure.  Due to oral venting, a nasal pillow mask with chinstrap was recommended.  Since initiating CPAP therapy, he has felt well.  He typically goes to bed at 10:30 and wakes up the proximal he 7:30 AM.  A recent download was obtained from 11/22/2014 through 12/21/2014.  Compliance is excellent with 100% days used and 100% of days used greater than 4 hours.  He is averaging 7 hours and 30 minutes of sleep per night.  On his 70 m water pressure, AHI is excellent at 0.8.  There is no significant mask leak and he hasn't ResMed air.  PT 10 medium size mask.  He denies residual snoring.  Sleep is restorative.  He denies daytime sleepiness.  He denies bruxism.  He was found to have increased periodic limb movements on his sleep study and had increased PLMS index of 19.7, but denies painful restless legs or the "urge to move."  Epworth Sleepiness Scale: Situation   Chance of Dozing/Sleeping (0 = never , 1 = slight chance , 2 = moderate chance , 3 = high chance )   sitting and reading 1   watching TV 1   sitting inactive in a public place 0   being a  passenger in a motor vehicle for an hour or more 1   lying down in the afternoon 2   sitting and talking to someone 0   sitting quietly after lunch (no alcohol) 0   while stopped for a few minutes in traffic as the driver 0   Total Score  5    Past Medical History  Diagnosis Date  . ALLERGIC RHINITIS 10/03/2007  . ASTHMA, ACUTE 08/25/2009  . ERECTILE DYSFUNCTION 10/03/2007  . HYPERLIPIDEMIA 10/03/2007  . Rosacea   . CARDIAC MURMUR, AORTIC 10/03/2007  . Adenomatous colon polyp   . Atrial flutter 06/26/2014    Past Surgical History  Procedure Laterality Date  . Tonsillectomy    . Colonoscopy    . Adenoidectomy    . Tee without cardioversion N/A 07/01/2014    Procedure: TRANSESOPHAGEAL ECHOCARDIOGRAM (TEE);  Surgeon: [name], MD;  Location: [facility];  Service: Cardiovascular;  Laterality: N/A;  . Cardioversion N/A 07/01/2014    Procedure: CARDIOVERSION;  Surgeon: [name], MD;  Location: [facility];  Service: Cardiovascular;  Laterality: N/A;    No Known Allergies  Current Outpatient Prescriptions  Medication Sig Dispense Refill  . apixaban (ELIQUIS) 5 MG TABS tablet Take 1 tablet (5 mg total) by mouth 2 (two) times daily. 180 tablet 3  . co-enzyme Q-10 30  MG capsule Take 30 mg by mouth daily.    . fluticasone (FLONASE) 50 MCG/ACT nasal spray PLACE 2 SPRAYS INTO BOTH NOSTRILS DAILY. 48 g 11  . minocycline (MINOCIN,DYNACIN) 100 MG capsule Take 1 capsule (100 mg total) by mouth daily. 100 capsule 3  . Multiple Vitamin (MULTIVITAMIN) tablet Take 1 tablet by mouth daily.    . sildenafil (VIAGRA) 100 MG tablet Take 0.5-1 tablets (50-100 mg total) by mouth daily as needed for erectile dysfunction. 10 tablet 11  . simvastatin (ZOCOR) 40 MG tablet TAKE 1 TABLET AT BEDTIME 90 tablet 3   No current facility-administered medications for this visit.    Social History   Social History  . Marital Status: Married    Spouse Name: N/A  . Number of Children: N/A  . Years of  Education: N/A   Occupational History  . Not on file.   Social History Main Topics  . Smoking status: Former Research scientist (life sciences)  . Smokeless tobacco: Never Used  . Alcohol Use: 4.8 oz/week    8 Glasses of wine per week  . Drug Use: No  . Sexual Activity: Not on file   Other Topics Concern  . Not on file   Social History Narrative    Family History  Problem Relation Age of Onset  . Diabetes Mother   . Cancer Father     liver ca  . Liver cancer Father      ROS General: Negative; No fevers, chills, or night sweats HEENT: Negative; No changes in vision or hearing, sinus congestion, difficulty swallowing Pulmonary: Negative; No cough, wheezing, shortness of breath, hemoptysis Cardiovascular: History of atrial fibrillation GI: Negative; No nausea, vomiting, diarrhea, or abdominal pain GU: Negative; No dysuria, hematuria, or difficulty voiding Musculoskeletal: Negative; no myalgias, joint pain, or weakness Hematologic: Negative; no easy bruising, bleeding Endocrine: Negative; no heat/cold intolerance Neuro: Negative; no changes in balance, headaches Skin: Negative; No rashes or skin lesions Psychiatric: Negative; No behavioral problems, depression Sleep: See history of present illness   Physical Exam BP 158/88 mmHg  Pulse 77  Ht 6' 3"  (1.905 m)  Wt 244 lb 6.4 oz (110.859 kg)  BMI 30.55 kg/m2  Wt Readings from Last 3 Encounters:  12/30/14 244 lb 6.4 oz (110.859 kg)  12/07/14 243 lb 8 oz (110.451 kg)  10/08/14 230 lb (104.327 kg)   General: Alert, oriented, no distress.  Skin: normal turgor, no rashes HEENT: Normocephalic, atraumatic. Pupils round and reactive; sclera anicteric; extraocular muscles intact; Fundi without hemorrhages or exudates Nose without nasal septal hypertrophy Mouth/Parynx benign; Mallinpatti scale 3 Neck: No JVD, no carotid bruits Lungs: clear to ausculatation and percussion; no wheezing or rales  Chest wall: No tenderness to palpation Heart: RRR, s1 s2  normal 2/6 early peaking aortic ejection murmur, no S3 gallop.  No rubs thrills or heaves Abdomen: soft, nontender; no hepatosplenomehaly, BS+; abdominal aorta nontender and not dilated by palpation. Back: No CVA tenderness Pulses 2+ Extremities: no clubbinbg cyanosis or edema, Homan's sign negative  Neurologic: grossly nonfocal; cranial nerves intact. Psychological: Normal affect and mood.   LABS:  BMP Latest Ref Rng 06/29/2014 05/05/2014 04/08/2013  Glucose 70 - 99 mg/dL 109(H) 121(H) 107(H)  BUN 6 - 23 mg/dL 24(H) 21 21  Creatinine 0.50 - 1.35 mg/dL 0.90 1.2 1.0  Sodium 135 - 145 mEq/L 138 146(H) 137  Potassium 3.5 - 5.3 mEq/L 4.9 6.2(HH) 5.0  Chloride 96 - 112 mEq/L 103 110 103  CO2 19 - 32 mEq/L 27 29 27  Calcium 8.4 - 10.5 mg/dL 9.0 9.4 8.7     Hepatic Function Latest Ref Rng 06/29/2014 05/05/2014 04/08/2013  Total Protein 6.0 - 8.3 g/dL 6.3 6.7 6.4  Albumin 3.5 - 5.2 g/dL 4.0 3.9 3.8  AST 0 - 37 U/L 22 24 26   ALT 0 - 53 U/L 23 29 29   Alk Phosphatase 39 - 117 U/L 60 71 61  Total Bilirubin 0.2 - 1.2 mg/dL 1.6(H) 1.3(H) 1.4(H)  Bilirubin, Direct 0.0 - 0.3 mg/dL - 0.2 0.2     CBC Latest Ref Rng 06/29/2014 05/05/2014 04/08/2013  WBC 4.0 - 10.5 K/uL 6.3 5.4 5.0  Hemoglobin 13.0 - 17.0 g/dL 16.7 16.8 16.5  Hematocrit 39.0 - 52.0 % 48.8 50.3 48.2  Platelets 150 - 400 K/uL 149(L) 135.0(L) 139.0(L)     Lipid Panel     Component Value Date/Time   CHOL 205* 05/05/2014 0930   TRIG 95.0 05/05/2014 0930   HDL 50.00 05/05/2014 0930   CHOLHDL 4 05/05/2014 0930   VLDL 19.0 05/05/2014 0930   LDLCALC 136* 05/05/2014 0930   LDLDIRECT 143.5 01/22/2012 0916     RADIOLOGY: No results found.    ASSESSMENT AND PLAN: Gary [name] is a 79 year old gentleman who is a history of atrial flutter and is status post cardioversion.  He was found to have moderate obstructive sleep apnea. On his diagnostic study, he also was noted have periodic limb movement disorder and had an elevated index at  19.7, but he denies painful restless legs.  His download demonstrates excellent benefit with CPAP therapy.  He is meeting compliance standards with 100% use and is averaging 7 hours and 30 minutes per night on his 9 cm water pressure.  His AHI is excellent at 0.8.  His blood pressure today is controlled.  He is mildly obese, by virtue of a body mass index of 30.55 and additional weight loss was recommended. I answered all his questions.  He is tolerating CPAP well.  He is not having any breakthrough snoring.  His Epworth Sleepiness Scale is excellent and does not demonstrate residual daytime sleepiness.  He will continue with current treatment.  I will be available on an as-needed basis from a sleep perspective, and he will return to the cardiologic care of Dr. Sallyanne Kuster  Time spent: 25 minutes  Troy Sine, MD, [facility]  01/01/2015 10:41 PM

## 2015-01-12 DIAGNOSIS — Z85828 Personal history of other malignant neoplasm of skin: Secondary | ICD-10-CM | POA: Diagnosis not present

## 2015-01-12 DIAGNOSIS — D0472 Carcinoma in situ of skin of left lower limb, including hip: Secondary | ICD-10-CM | POA: Diagnosis not present

## 2015-03-30 DIAGNOSIS — Z85828 Personal history of other malignant neoplasm of skin: Secondary | ICD-10-CM | POA: Diagnosis not present

## 2015-03-30 DIAGNOSIS — L820 Inflamed seborrheic keratosis: Secondary | ICD-10-CM | POA: Diagnosis not present

## 2015-03-30 DIAGNOSIS — D1801 Hemangioma of skin and subcutaneous tissue: Secondary | ICD-10-CM | POA: Diagnosis not present

## 2015-03-30 DIAGNOSIS — L570 Actinic keratosis: Secondary | ICD-10-CM | POA: Diagnosis not present

## 2015-03-30 DIAGNOSIS — D485 Neoplasm of uncertain behavior of skin: Secondary | ICD-10-CM | POA: Diagnosis not present

## 2015-03-30 DIAGNOSIS — L812 Freckles: Secondary | ICD-10-CM | POA: Diagnosis not present

## 2015-03-30 DIAGNOSIS — L821 Other seborrheic keratosis: Secondary | ICD-10-CM | POA: Diagnosis not present

## 2015-03-30 DIAGNOSIS — C44722 Squamous cell carcinoma of skin of right lower limb, including hip: Secondary | ICD-10-CM | POA: Diagnosis not present

## 2015-04-27 DIAGNOSIS — H2513 Age-related nuclear cataract, bilateral: Secondary | ICD-10-CM | POA: Diagnosis not present

## 2015-04-27 DIAGNOSIS — H4323 Crystalline deposits in vitreous body, bilateral: Secondary | ICD-10-CM | POA: Diagnosis not present

## 2015-04-27 DIAGNOSIS — H1851 Endothelial corneal dystrophy: Secondary | ICD-10-CM | POA: Diagnosis not present

## 2015-05-05 ENCOUNTER — Other Ambulatory Visit: Payer: Self-pay | Admitting: Cardiovascular Disease

## 2015-05-05 NOTE — Telephone Encounter (Signed)
Rx request sent to pharmacy.  

## 2015-05-24 ENCOUNTER — Other Ambulatory Visit (INDEPENDENT_AMBULATORY_CARE_PROVIDER_SITE_OTHER): Payer: Medicare Other

## 2015-05-24 DIAGNOSIS — Z0000 Encounter for general adult medical examination without abnormal findings: Secondary | ICD-10-CM | POA: Diagnosis not present

## 2015-05-24 DIAGNOSIS — E785 Hyperlipidemia, unspecified: Secondary | ICD-10-CM | POA: Diagnosis not present

## 2015-05-24 DIAGNOSIS — Z125 Encounter for screening for malignant neoplasm of prostate: Secondary | ICD-10-CM | POA: Diagnosis not present

## 2015-05-24 LAB — POCT URINALYSIS DIPSTICK
BILIRUBIN UA: NEGATIVE
GLUCOSE UA: NEGATIVE
KETONES UA: NEGATIVE
Leukocytes, UA: NEGATIVE
NITRITE UA: NEGATIVE
PH UA: 5
RBC UA: NEGATIVE
SPEC GRAV UA: 1.02
Urobilinogen, UA: 0.2

## 2015-05-24 LAB — HEPATIC FUNCTION PANEL
ALK PHOS: 65 U/L (ref 39–117)
ALT: 21 U/L (ref 0–53)
AST: 21 U/L (ref 0–37)
Albumin: 4 g/dL (ref 3.5–5.2)
BILIRUBIN TOTAL: 1 mg/dL (ref 0.2–1.2)
Bilirubin, Direct: 0.2 mg/dL (ref 0.0–0.3)
Total Protein: 6.4 g/dL (ref 6.0–8.3)

## 2015-05-24 LAB — CBC WITH DIFFERENTIAL/PLATELET
BASOS ABS: 0 10*3/uL (ref 0.0–0.1)
Basophils Relative: 0.3 % (ref 0.0–3.0)
EOS PCT: 1.7 % (ref 0.0–5.0)
Eosinophils Absolute: 0.1 10*3/uL (ref 0.0–0.7)
HCT: 52.4 % — ABNORMAL HIGH (ref 39.0–52.0)
HEMOGLOBIN: 17.3 g/dL — AB (ref 13.0–17.0)
LYMPHS ABS: 1.4 10*3/uL (ref 0.7–4.0)
Lymphocytes Relative: 25.5 % (ref 12.0–46.0)
MCHC: 33 g/dL (ref 30.0–36.0)
MCV: 96.4 fl (ref 78.0–100.0)
MONO ABS: 0.6 10*3/uL (ref 0.1–1.0)
MONOS PCT: 11 % (ref 3.0–12.0)
NEUTROS PCT: 61.5 % (ref 43.0–77.0)
Neutro Abs: 3.4 10*3/uL (ref 1.4–7.7)
Platelets: 163 10*3/uL (ref 150.0–400.0)
RBC: 5.44 Mil/uL (ref 4.22–5.81)
RDW: 13.4 % (ref 11.5–15.5)
WBC: 5.5 10*3/uL (ref 4.0–10.5)

## 2015-05-24 LAB — LIPID PANEL
CHOLESTEROL: 136 mg/dL (ref 0–200)
HDL: 41.3 mg/dL (ref >39.00)
LDL CALC: 79 mg/dL (ref 0–99)
NONHDL: 94.79
Total CHOL/HDL Ratio: 3
Triglycerides: 78 mg/dL (ref 0.0–149.0)
VLDL: 15.6 mg/dL (ref 0.0–40.0)

## 2015-05-24 LAB — BASIC METABOLIC PANEL
BUN: 27 mg/dL — ABNORMAL HIGH (ref 6–23)
CALCIUM: 9.1 mg/dL (ref 8.4–10.5)
CO2: 29 mEq/L (ref 19–32)
Chloride: 107 mEq/L (ref 96–112)
Creatinine, Ser: 1.14 mg/dL (ref 0.40–1.50)
GFR: 66.17 mL/min (ref >60.00)
GLUCOSE: 117 mg/dL — AB (ref 70–99)
POTASSIUM: 5.4 meq/L — AB (ref 3.5–5.1)
SODIUM: 143 meq/L (ref 135–145)

## 2015-05-24 LAB — PSA: PSA: 0.91 ng/mL (ref 0.10–4.00)

## 2015-05-24 LAB — TSH: TSH: 2.7 u[IU]/mL (ref 0.35–4.50)

## 2015-05-25 ENCOUNTER — Other Ambulatory Visit (INDEPENDENT_AMBULATORY_CARE_PROVIDER_SITE_OTHER): Payer: Medicare Other

## 2015-05-25 DIAGNOSIS — E119 Type 2 diabetes mellitus without complications: Secondary | ICD-10-CM

## 2015-05-25 LAB — HEMOGLOBIN A1C: HEMOGLOBIN A1C: 5.8 % (ref 4.6–6.5)

## 2015-05-31 ENCOUNTER — Encounter: Payer: Self-pay | Admitting: Family Medicine

## 2015-05-31 ENCOUNTER — Ambulatory Visit (INDEPENDENT_AMBULATORY_CARE_PROVIDER_SITE_OTHER): Payer: Medicare Other | Admitting: Family Medicine

## 2015-05-31 VITALS — BP 120/84 | Temp 97.9°F | Ht 74.5 in | Wt 240.0 lb

## 2015-05-31 DIAGNOSIS — Z23 Encounter for immunization: Secondary | ICD-10-CM | POA: Diagnosis not present

## 2015-05-31 DIAGNOSIS — I350 Nonrheumatic aortic (valve) stenosis: Secondary | ICD-10-CM

## 2015-05-31 DIAGNOSIS — Z0000 Encounter for general adult medical examination without abnormal findings: Secondary | ICD-10-CM

## 2015-05-31 DIAGNOSIS — N401 Enlarged prostate with lower urinary tract symptoms: Secondary | ICD-10-CM

## 2015-05-31 DIAGNOSIS — R351 Nocturia: Secondary | ICD-10-CM

## 2015-05-31 DIAGNOSIS — E785 Hyperlipidemia, unspecified: Secondary | ICD-10-CM

## 2015-05-31 DIAGNOSIS — I483 Typical atrial flutter: Secondary | ICD-10-CM

## 2015-05-31 MED ORDER — MINOCYCLINE HCL 100 MG PO CAPS: 100.0000 mg | ORAL_CAPSULE | Freq: Every day | ORAL | Status: DC

## 2015-05-31 MED ORDER — SIMVASTATIN 40 MG PO TABS: 40.0000 mg | ORAL_TABLET | Freq: Every day | ORAL | Status: DC

## 2015-05-31 NOTE — Patient Instructions (Addendum)
.    Continue u  exercise and good diet program  Consider taking the Zocor,,,,,,,,, Monday Wednesday Friday  Return in one year for general physical exam sooner if any problems  Rachel's extension is [phone],,,,,,,,,,,,,, go to the [facility] in donating blood every 4 months

## 2015-05-31 NOTE — Progress Notes (Signed)
Pre visit review using our clinic review tool, if applicable. No additional management support is needed unless otherwise documented below in the visit note. 

## 2015-05-31 NOTE — Progress Notes (Signed)
   Subjective:    Patient ID: Gary [name], male    DOB: [birth year], 80 y.o.   MRN: [id number]  HPI Gary [name] is a 81 year old married male nonsmoker who comes in today for general physical examination because of a history of chronic intermittent AF hyperlipidemia  He's on eliquis 5 mg twice a day for chronic intermittent AF. He is in sinus rhythm today  He takes minocycline 100 mg daily because of a history of rosacea  He takes 40 mg of Zocor daily because of a history of hyperlipidemia  We've tried Cialis Levitra and Viagra note of which seem to help his sexual dysfunction. He's been seen by urology. The next step as injections or the pump which he his considering at this juncture. However he is inclined not to do anything further.  He gets routine eye care, dental care, colonoscopy was 2015 normal therefore that would be his last colonoscopy no bowel or bladder problems  He's in the process of getting a healthcare power of attorney and living will.   Review of Systems  Constitutional: Negative.   HENT: Negative.   Eyes: Negative.   Respiratory: Negative.   Cardiovascular: Negative.   Gastrointestinal: Negative.   Endocrine: Negative.   Genitourinary: Negative.   Musculoskeletal: Negative.   Skin: Negative.   Allergic/Immunologic: Negative.   Neurological: Negative.   Hematological: Negative.   Psychiatric/Behavioral: Negative.        Objective:   Physical Exam  Constitutional: He is oriented to person, place, and time. He appears well-developed and well-nourished.  HENT:  Head: Normocephalic and atraumatic.  Right Ear: External ear normal.  Left Ear: External ear normal.  Nose: Nose normal.  Mouth/Throat: Oropharynx is clear and moist.  Eyes: Conjunctivae and EOM are normal. Pupils are equal, round, and reactive to light.  Neck: Normal range of motion. Neck supple. No JVD present. No tracheal deviation present. No thyromegaly present.  Cardiovascular: Normal rate,  regular rhythm and intact distal pulses.  Exam reveals no gallop and no friction rub.   Murmur heard. No carotid neurologic bruits peripheral pulses 2+ and symmetrical  Sinus rhythm today  Murmur of a S unchanged  Pulmonary/Chest: Effort normal and breath sounds normal. No stridor. No respiratory distress. He has no wheezes. He has no rales. He exhibits no tenderness.  Abdominal: Soft. Bowel sounds are normal. He exhibits no distension and no mass. There is no tenderness. There is no rebound and no guarding.  Genitourinary: Rectum normal and penis normal. Guaiac negative stool. No penile tenderness.  1+ symmetrical nonnodular BPH  Musculoskeletal: Normal range of motion. He exhibits no edema or tenderness.  Lymphadenopathy:    He has no cervical adenopathy.  Neurological: He is alert and oriented to person, place, and time. He has normal reflexes. No cranial nerve deficit. He exhibits normal muscle tone.  Skin: Skin is warm and dry. No rash noted. No erythema. No pallor.  Psychiatric: He has a normal mood and affect. His behavior is normal. Judgment and thought content normal.  Nursing note and vitals reviewed.  He's had a uvulectomy for sleep apnea       Assessment & Plan:  Hyperlipidemia,,,,,,,, continue Zocor  Intermittent AF....... continue anticoagulation and cardiac follow-up  Aortic stenosis asymptomatic........ no treatment at this juncture.,  Rosacea mainly involving his nose....... continue the doxycycline 100 mg daily  Obstructive sleep apnea,,,,,,,, continue the nasal plugs and follow-up by pulmonary

## 2015-09-20 DIAGNOSIS — H348322 Tributary (branch) retinal vein occlusion, left eye, stable: Secondary | ICD-10-CM | POA: Diagnosis not present

## 2015-10-05 DIAGNOSIS — L570 Actinic keratosis: Secondary | ICD-10-CM | POA: Diagnosis not present

## 2015-10-05 DIAGNOSIS — L812 Freckles: Secondary | ICD-10-CM | POA: Diagnosis not present

## 2015-10-05 DIAGNOSIS — L821 Other seborrheic keratosis: Secondary | ICD-10-CM | POA: Diagnosis not present

## 2015-10-05 DIAGNOSIS — D1801 Hemangioma of skin and subcutaneous tissue: Secondary | ICD-10-CM | POA: Diagnosis not present

## 2015-10-05 DIAGNOSIS — Z85828 Personal history of other malignant neoplasm of skin: Secondary | ICD-10-CM | POA: Diagnosis not present

## 2015-10-21 DIAGNOSIS — H34832 Tributary (branch) retinal vein occlusion, left eye, with macular edema: Secondary | ICD-10-CM | POA: Diagnosis not present

## 2015-11-15 DIAGNOSIS — H34832 Tributary (branch) retinal vein occlusion, left eye, with macular edema: Secondary | ICD-10-CM | POA: Diagnosis not present

## 2015-11-15 DIAGNOSIS — H3562 Retinal hemorrhage, left eye: Secondary | ICD-10-CM | POA: Diagnosis not present

## 2015-11-15 DIAGNOSIS — H43813 Vitreous degeneration, bilateral: Secondary | ICD-10-CM | POA: Diagnosis not present

## 2015-11-25 DIAGNOSIS — H34832 Tributary (branch) retinal vein occlusion, left eye, with macular edema: Secondary | ICD-10-CM | POA: Diagnosis not present

## 2015-11-30 ENCOUNTER — Other Ambulatory Visit: Payer: Self-pay | Admitting: Cardiovascular Disease

## 2015-12-10 ENCOUNTER — Ambulatory Visit (INDEPENDENT_AMBULATORY_CARE_PROVIDER_SITE_OTHER): Payer: Medicare Other | Admitting: Cardiovascular Disease

## 2015-12-10 ENCOUNTER — Encounter: Payer: Self-pay | Admitting: Cardiovascular Disease

## 2015-12-10 VITALS — BP 131/82 | HR 70 | Ht 75.0 in | Wt 244.2 lb

## 2015-12-10 DIAGNOSIS — G4733 Obstructive sleep apnea (adult) (pediatric): Secondary | ICD-10-CM | POA: Diagnosis not present

## 2015-12-10 DIAGNOSIS — E785 Hyperlipidemia, unspecified: Secondary | ICD-10-CM | POA: Diagnosis not present

## 2015-12-10 DIAGNOSIS — I483 Typical atrial flutter: Secondary | ICD-10-CM | POA: Diagnosis not present

## 2015-12-10 MED ORDER — APIXABAN 5 MG PO TABS: 5.0000 mg | ORAL_TABLET | Freq: Two times a day (BID) | ORAL | 3 refills | Status: DC

## 2015-12-10 NOTE — Patient Instructions (Addendum)
Dr Sallyanne Kuster recommends that you schedule a follow-up appointment in 1 year. You will receive a reminder letter in the mail two months in advance. If you don't receive a letter, please call our office to schedule the follow-up appointment.  If you need a refill on your cardiac medications before your next appointment, please call your pharmacy.   Medication samples have been provided to the patient. Drug name: Eliquis 5 mg Qty: 14 tabs LOT: [id number] Exp.Date: 12/2017

## 2015-12-10 NOTE — Progress Notes (Signed)
Cardiology Office Note    Date:  12/10/2015   ID:  Gary [name], DOB August 02, 1935, MRN [id number]  PCP:  Joycelyn Man, MD  Cardiologist:   [name], MD   No chief complaint on file.   History of Present Illness:  Gary [name] is a 80 y.o. male with asymptomatic atrial flutter with spontaneously controlled ventricular rate, recurrent following previous cardioversion without significant structural heart disease. He returns for routine follow-up. Since his last appointment he has not had any problems with bleeding or focal neurological events and is compliant with his oral anticoagulants. He denies dizziness or syncope and has very infrequent and brief isolated palpitations. He also denies exertional angina or dyspnea. He exercises with a trainer twice weekly. He walks on the treadmill for 3-1/2 miles every time and does not feel limited. He denies leg edema, claudication, major weight changes, intolerance to heat or cold, cough or hemoptysis.  Previous workup with echocardiography shows aortic valve sclerosis without stenosis and normal left ventricular function. He has a markedly dilated left atrium. He has obstructive sleep apnea and is compliant with CPAP.    Past Medical History:  Diagnosis Date  . Adenomatous colon polyp   . ALLERGIC RHINITIS 10/03/2007  . ASTHMA, ACUTE 08/25/2009  . Atrial flutter ([place]) 06/26/2014  . CARDIAC MURMUR, AORTIC 10/03/2007  . ERECTILE DYSFUNCTION 10/03/2007  . HYPERLIPIDEMIA 10/03/2007  . Rosacea     Past Surgical History:  Procedure Laterality Date  . ADENOIDECTOMY    . CARDIOVERSION N/A 07/01/2014   Procedure: CARDIOVERSION;  Surgeon: [name], MD;  Location: [facility];  Service: Cardiovascular;  Laterality: N/A;  . COLONOSCOPY    . TEE WITHOUT CARDIOVERSION N/A 07/01/2014   Procedure: TRANSESOPHAGEAL ECHOCARDIOGRAM (TEE);  Surgeon: [name], MD;  [facility];  Service: Cardiovascular;  Laterality: N/A;  .  TONSILLECTOMY      Current Medications: Outpatient Medications Prior to Visit  Medication Sig Dispense Refill  . co-enzyme Q-10 30 MG capsule Take 30 mg by mouth every other day.     . Multiple Vitamin (MULTIVITAMIN) tablet Take 1 tablet by mouth daily.    [name] ELIQUIS 5 MG TABS tablet TAKE 1 TABLET TWICE DAILY 180 tablet 1  . fluticasone (FLONASE) 50 MCG/ACT nasal spray PLACE 2 SPRAYS INTO BOTH NOSTRILS DAILY. (Patient not taking: Reported on 12/10/2015) 48 g 11  . minocycline (MINOCIN,DYNACIN) 100 MG capsule Take 1 capsule (100 mg total) by mouth daily. (Patient not taking: Reported on 12/10/2015) 100 capsule 3  . simvastatin (ZOCOR) 40 MG tablet Take 1 tablet (40 mg total) by mouth at bedtime. (Patient not taking: Reported on 12/10/2015) 90 tablet 3   No facility-administered medications prior to visit.      Allergies:   Review of patient's allergies indicates no known allergies.   Social History   Social History  . Marital status: Married    Spouse name: N/A  . Number of children: N/A  . Years of education: N/A   Social History Main Topics  . Smoking status: Former Research scientist (life sciences)  . Smokeless tobacco: Never Used  . Alcohol use 4.8 oz/week    8 Glasses of wine per week  . Drug use: No  . Sexual activity: Not Asked   Other Topics Concern  . None   Social History Narrative  . None     Family History:  The patient's family history includes Cancer in his father; Diabetes in his mother; Liver cancer in his father.  ROS:   Please see the history of present illness.    ROS All other systems reviewed and are negative.   PHYSICAL EXAM:   VS:  BP 131/82   Pulse 70   Ht 6\' 3"  (1.905 m)   Wt 244 lb 3.2 oz (110.8 kg)   BMI 30.52 kg/m    GEN: Well nourished, well developed, in no acute distress  HEENT: normal  Neck: no JVD, carotid bruits, or masses Cardiac: Irregular rhythm, early peaking 2/6 systolic ejection murmur in the aortic focus; no diastolic murmurs, rubs, or gallops,no  edema  Respiratory:  clear to auscultation bilaterally, normal work of breathing GI: soft, nontender, nondistended, + BS MS: no deformity or atrophy  Skin: warm and dry, no rash Neuro:  Alert and Oriented x 3, Strength and sensation are intact Psych: euthymic mood, full affect  Wt Readings from Last 3 Encounters:  12/10/15 244 lb 3.2 oz (110.8 kg)  05/31/15 240 lb (108.9 kg)  12/30/14 244 lb 6.4 oz (110.9 kg)      Studies/Labs Reviewed:   EKG:  EKG is ordered today.  The ekg ordered today demonstrates Typical counterclockwise atrial flutter with variable, mostly 4:1 atrioventricular block  Recent Labs: 05/24/2015: ALT 21; BUN 27; Creatinine, Ser 1.14; Hemoglobin 17.3; Platelets 163.0; Potassium 5.4; Sodium 143; TSH 2.70   Lipid Panel    Component Value Date/Time   CHOL 136 05/24/2015 0828   TRIG 78.0 05/24/2015 0828   HDL 41.30 05/24/2015 0828   CHOLHDL 3 05/24/2015 0828   VLDL 15.6 05/24/2015 0828   LDLCALC 79 05/24/2015 0828    LDLDIRECT  143.5  01/22/2012 0916     ASSESSMENT:    1. Typical atrial flutter ([place])   2. Obstructive sleep apnea   3. Hyperlipidemia      PLAN:  In order of problems listed above:  1. AFlutter: Asymptomatic. Compliant and well tolerant of anticoagulation.CHADSVasc 3 (age, HTN). Rate to spontaneously well-controlled, consistent with significant AV conduction abnormalities. Warned him about potential symptoms of high-grade AV block, if these occur the treatment would be implantation of a pacemaker. Avoid medications with negative chronotropic effect 2. OSA: Reports compliance with therapy. Denies daytime hypersomnolence. Has an appointment in Dr. Claiborne Billings sleep clinic next month 3. HLP: Without known vascular/coronary problems. Satisfactory lipid profile statin therapy.     Medication Adjustments/Labs and Tests Ordered: Current medicines are reviewed at length with the patient today.  Concerns regarding medicines are outlined above.   Medication changes, Labs and Tests ordered today are listed in the Patient Instructions below. Patient Instructions  Dr Sallyanne Kuster recommends that you schedule a follow-up appointment in 1 year. You will receive a reminder letter in the mail two months in advance. If you don't receive a letter, please call our office to schedule the follow-up appointment.  If you need a refill on your cardiac medications before your next appointment, please call your pharmacy.   Medication samples have been provided to the patient. Drug name: Eliquis 5 mg Qty: 14 tabs LOT: [id number] Exp.Date: 12/2017    SignedSanda Klein, MD  12/10/2015 6:41 PM    [address], [place], [place]  [address] Phone: [phone]; Fax: [phone]

## 2015-12-17 ENCOUNTER — Telehealth: Payer: Self-pay | Admitting: Cardiovascular Disease

## 2015-12-17 NOTE — Telephone Encounter (Signed)
New Message  Patient calling the office for samples of medication:   1.  What medication and dosage are you requesting samples for? Eliquis 5mg   2.  Are you currently out of this medication? Per states yes took last pill this morning 8/18  Pt states he has not recieved refills of Eliquis and wants to get some samples until they come in the mail. Please call back to discuss

## 2015-12-17 NOTE — Telephone Encounter (Signed)
Samples at front desk, patient aware and voiced thanks.

## 2015-12-28 DIAGNOSIS — H3562 Retinal hemorrhage, left eye: Secondary | ICD-10-CM | POA: Diagnosis not present

## 2015-12-28 DIAGNOSIS — H34832 Tributary (branch) retinal vein occlusion, left eye, with macular edema: Secondary | ICD-10-CM | POA: Diagnosis not present

## 2015-12-28 DIAGNOSIS — H35371 Puckering of macula, right eye: Secondary | ICD-10-CM | POA: Diagnosis not present

## 2015-12-28 DIAGNOSIS — H43813 Vitreous degeneration, bilateral: Secondary | ICD-10-CM | POA: Diagnosis not present

## 2016-01-06 ENCOUNTER — Ambulatory Visit (INDEPENDENT_AMBULATORY_CARE_PROVIDER_SITE_OTHER): Payer: Medicare Other | Admitting: Cardiovascular Disease

## 2016-01-06 ENCOUNTER — Encounter: Payer: Self-pay | Admitting: Cardiovascular Disease

## 2016-01-06 VITALS — BP 126/70 | HR 55 | Ht 75.0 in | Wt 250.4 lb

## 2016-01-06 DIAGNOSIS — G4761 Periodic limb movement disorder: Secondary | ICD-10-CM | POA: Diagnosis not present

## 2016-01-06 DIAGNOSIS — I483 Typical atrial flutter: Secondary | ICD-10-CM | POA: Diagnosis not present

## 2016-01-06 DIAGNOSIS — G4733 Obstructive sleep apnea (adult) (pediatric): Secondary | ICD-10-CM | POA: Diagnosis not present

## 2016-01-06 NOTE — Progress Notes (Signed)
Patient ID: [name], male   DOB: 1935/07/01, 80 y.o.   MRN: [id number]     HPI: Gary [name], is a 80 y.o. male who presents to sleep clinic for one-year follow-up evaluation after initiation of CPAP therapy for recently diagnosed obstructive sleep apnea.  [name] is a 80 year old male who is a patient of Dr. Sallyanne Kuster.  He is status post cardioversion for atrial flutter, and has a history of hyperlipidemia and aortic valve sclerosis.  He was referred for a sleep study which was done in May 2016 which revealed moderate obstructive sleep apnea with an AHI of 15.9 overall and 20.5 per hour during REM sleep.  He had reduced sleep efficiency at 54.8%.  Latency to sleep onset was prolonged at 36 minutes.  There was mild snoring.  Oxygen desaturated to 89%.  He underwent a CPAP titration trial and adnexal response to CPAP therapy with the initial recommended CPAP pressure 9 cm water pressure.  Due to oral venting, a nasal pillow mask with chinstrap was recommended.  Since initiating CPAP therapy, he has felt well.  He typically goes to bed at 10:30 and wakes up the proximal he 7:30 AM.  Last year, a download was obtained from 11/22/2014 through 12/21/2014.  Compliance was excellent with 100% days used and 100% of days used greater than 4 hours.  He is averaging 7 hours and 30 minutes of sleep per night.  On his 5 m water pressure, AHI is excellent at 0.8.  There is no significant mask leak and he has a ResMed AirFit P10 medium size mask.  Over the past year, he admits to 100% compliance with CPAP therapy.  A new download was obtained from 11/28/2015 through 12/27/2015.  This confirms 100% compliance both with usage stays and greater than 4 hours of use.  He is averaging 7 hours and 22 minutes of use per night.  He is set at a 9 cm water pressure.  AHI remains excellent at 1.2 per hour.  He does have increased leak with 95th percent average at 30.0.  Despite this mild leak newborn nasal pillows.   His AHI remains excellent and undoubtedly is probably slightly less than its current reading.  He denies residual snoring.  Sleep is restorative.  He denies daytime sleepiness.  He denies bruxism.  He was found to have increased periodic limb movements on his initial sleep study and had increased PLMS index of 19.7, but denies painful restless legs or the "urge to move."  Epworth Sleepiness Scale: Situation   Chance of Dozing/Sleeping (0 = never , 1 = slight chance , 2 = moderate chance , 3 = high chance )   sitting and reading 1   watching TV 1   sitting inactive in a public place 0   being a passenger in a motor vehicle for an hour or more 1   lying down in the afternoon 2   sitting and talking to someone 0   sitting quietly after lunch (no alcohol) 0   while stopped for a few minutes in traffic as the driver 0   Total Score  5    Past Medical History:  Diagnosis Date  . Adenomatous colon polyp   . ALLERGIC RHINITIS 10/03/2007  . ASTHMA, ACUTE 08/25/2009  . Atrial flutter ([place]) 06/26/2014  . CARDIAC MURMUR, AORTIC 10/03/2007  . ERECTILE DYSFUNCTION 10/03/2007  . HYPERLIPIDEMIA 10/03/2007  . Rosacea     Past Surgical History:  Procedure Laterality Date  .  ADENOIDECTOMY    . CARDIOVERSION N/A 07/01/2014   Procedure: CARDIOVERSION;  Surgeon: [name], MD;  Location: [facility] ENDOSCOPY;  Service: Cardiovascular;  Laterality: N/A;  . COLONOSCOPY    . TEE WITHOUT CARDIOVERSION N/A 07/01/2014   Procedure: TRANSESOPHAGEAL ECHOCARDIOGRAM (TEE);  Surgeon: [name], MD;  Location: [facility] ENDOSCOPY;  Service: Cardiovascular;  Laterality: N/A;  . TONSILLECTOMY      No Known Allergies  Current Outpatient Prescriptions  Medication Sig Dispense Refill  . apixaban (ELIQUIS) 5 MG TABS tablet Take 1 tablet (5 mg total) by mouth 2 (two) times daily. 180 tablet 3  . co-enzyme Q-10 30 MG capsule Take 30 mg by mouth every other day.     . fluticasone (FLONASE) 50 MCG/ACT nasal spray Place 1 spray into  both nostrils every other day.    . minocycline (MINOCIN,DYNACIN) 100 MG capsule Take 100 mg by mouth every other day.    . Multiple Vitamin (MULTIVITAMIN) tablet Take 1 tablet by mouth daily.    . simvastatin (ZOCOR) 40 MG tablet Take 20 mg by mouth every other day.     No current facility-administered medications for this visit.     Social History   Social History  . Marital status: Married    Spouse name: N/A  . Number of children: N/A  . Years of education: N/A   Occupational History  . Not on file.   Social History Main Topics  . Smoking status: Former Research scientist (life sciences)  . Smokeless tobacco: Never Used  . Alcohol use 4.8 oz/week    8 Glasses of wine per week  . Drug use: No  . Sexual activity: Not on file   Other Topics Concern  . Not on file   Social History Narrative  . No narrative on file    Family History  Problem Relation Age of Onset  . Diabetes Mother   . Cancer Father     liver ca  . Liver cancer Father      ROS General: Negative; No fevers, chills, or night sweats HEENT: Negative; No changes in vision or hearing, sinus congestion, difficulty swallowing Pulmonary: Negative; No cough, wheezing, shortness of breath, hemoptysis Cardiovascular: History of atrial fibrillation GI: Negative; No nausea, vomiting, diarrhea, or abdominal pain GU: Negative; No dysuria, hematuria, or difficulty voiding Musculoskeletal: Negative; no myalgias, joint pain, or weakness Hematologic: Negative; no easy bruising, bleeding Endocrine: Negative; no heat/cold intolerance Neuro: Negative; no changes in balance, headaches Skin: Negative; No rashes or skin lesions Psychiatric: Negative; No behavioral problems, depression Sleep: See history of present illness   Physical Exam BP 126/70 (BP Location: Left Arm, Patient Position: Sitting, Cuff Size: Normal)   Pulse (!) 55   Ht 6' 3"  (1.905 m)   Wt 250 lb 6.4 oz (113.6 kg)   BMI 31.30 kg/m   Wt Readings from Last 3 Encounters:    01/06/16 250 lb 6.4 oz (113.6 kg)  12/10/15 244 lb 3.2 oz (110.8 kg)  05/31/15 240 lb (108.9 kg)   General: Alert, oriented, no distress.  Skin: normal turgor, no rashes HEENT: Normocephalic, atraumatic. Pupils round and reactive; sclera anicteric; extraocular muscles intact; Fundi without hemorrhages or exudates Nose without nasal septal hypertrophy Mouth/Parynx benign; He is status post  UPPP surgery. Neck: No JVD, no carotid bruits Lungs: clear to ausculatation and percussion; no wheezing or rales  Chest wall: No tenderness to palpation Heart: RRR, s1 s2 normal 2/6 early peaking aortic ejection murmur, no S3 gallop.  No rubs thrills or heaves  Abdomen: soft, nontender; no hepatosplenomehaly, BS+; abdominal aorta nontender and not dilated by palpation. Back: No CVA tenderness Pulses 2+ Extremities: no clubbing cyanosis or edema, Homan's sign negative  Neurologic: grossly nonfocal; cranial nerves intact. Psychological: Normal affect and mood.   LABS:  BMP Latest Ref Rng & Units 05/24/2015 06/29/2014 05/05/2014  Glucose 70 - 99 mg/dL 117(H) 109(H) 121(H)  BUN 6 - 23 mg/dL 27(H) 24(H) 21  Creatinine 0.40 - 1.50 mg/dL 1.14 0.90 1.2  Sodium 135 - 145 mEq/L 143 138 146(H)  Potassium 3.5 - 5.1 mEq/L 5.4(H) 4.9 6.2(HH)  Chloride 96 - 112 mEq/L 107 103 110  CO2 19 - 32 mEq/L 29 27 29   Calcium 8.4 - 10.5 mg/dL 9.1 9.0 9.4     Hepatic Function Latest Ref Rng & Units 05/24/2015 06/29/2014 05/05/2014  Total Protein 6.0 - 8.3 g/dL 6.4 6.3 6.7  Albumin 3.5 - 5.2 g/dL 4.0 4.0 3.9  AST 0 - 37 U/L 21 22 24   ALT 0 - 53 U/L 21 23 29   Alk Phosphatase 39 - 117 U/L 65 60 71  Total Bilirubin 0.2 - 1.2 mg/dL 1.0 1.6(H) 1.3(H)  Bilirubin, Direct 0.0 - 0.3 mg/dL 0.2 - 0.2     CBC Latest Ref Rng & Units 05/24/2015 06/29/2014 05/05/2014  WBC 4.0 - 10.5 K/uL 5.5 6.3 5.4  Hemoglobin 13.0 - 17.0 g/dL 17.3(H) 16.7 16.8  Hematocrit 39.0 - 52.0 % 52.4(H) 48.8 50.3  Platelets 150.0 - 400.0 K/uL 163.0 149(L)  135.0(L)     Lipid Panel     Component Value Date/Time   CHOL 136 05/24/2015 0828   TRIG 78.0 05/24/2015 0828   HDL 41.30 05/24/2015 0828   CHOLHDL 3 05/24/2015 0828   VLDL 15.6 05/24/2015 0828   LDLCALC 79 05/24/2015 0828   LDLDIRECT 143.5 01/22/2012 0916     RADIOLOGY: No results found.    ASSESSMENT AND PLAN: [name] is a 80 year old gentleman who is a history of atrial flutter and is status post cardioversion.  He was found to have moderate obstructive sleep apnea. On his diagnostic study, he also was noted have periodic limb movement disorder and had an elevated index at 19.7, but he denies painful restless legs.  Both his download last year and his most recent download.  continue to show 100% compliance.  Although he did have a slight increase in PLMS index noted on his diagnostic study, he denies any symptoms associated with painful restless legs.  I reviewed his download in detail with him.  He does have a mild leak.  We discussed this may be due to to his nasal pillows which may need to be exchanged for a new set since they have become soft and susceptible to induce a leak.  His blood pressure today is stable.  Presently, he is going to bed at 11 PM and waking up between 7 to 7:30 every morning.  Sleep is restful.  He is unaware of any breakthrough snoring.  He will continue his current regimen.  Per Medicare guidelines,  I will see him in one year for reevaluation.   Troy Sine, MD, [facility]  01/06/2016 2:02 PM

## 2016-01-06 NOTE — Patient Instructions (Signed)
Your physician wants you to follow-up in: 1 year or sooner if needed in sleep clinic You will receive a reminder letter in the mail two months in advance. If you don't receive a letter, please call our office to schedule the follow-up appointment.

## 2016-01-11 ENCOUNTER — Encounter: Payer: Self-pay | Admitting: Gastroenterology

## 2016-02-14 DIAGNOSIS — B079 Viral wart, unspecified: Secondary | ICD-10-CM | POA: Diagnosis not present

## 2016-02-14 DIAGNOSIS — L820 Inflamed seborrheic keratosis: Secondary | ICD-10-CM | POA: Diagnosis not present

## 2016-02-14 DIAGNOSIS — L821 Other seborrheic keratosis: Secondary | ICD-10-CM | POA: Diagnosis not present

## 2016-02-14 DIAGNOSIS — D485 Neoplasm of uncertain behavior of skin: Secondary | ICD-10-CM | POA: Diagnosis not present

## 2016-02-14 DIAGNOSIS — Z85828 Personal history of other malignant neoplasm of skin: Secondary | ICD-10-CM | POA: Diagnosis not present

## 2016-02-15 DIAGNOSIS — H34832 Tributary (branch) retinal vein occlusion, left eye, with macular edema: Secondary | ICD-10-CM | POA: Diagnosis not present

## 2016-02-15 DIAGNOSIS — H3562 Retinal hemorrhage, left eye: Secondary | ICD-10-CM | POA: Diagnosis not present

## 2016-02-15 DIAGNOSIS — H35371 Puckering of macula, right eye: Secondary | ICD-10-CM | POA: Diagnosis not present

## 2016-02-15 DIAGNOSIS — H43813 Vitreous degeneration, bilateral: Secondary | ICD-10-CM | POA: Diagnosis not present

## 2016-03-01 DIAGNOSIS — I10 Essential (primary) hypertension: Secondary | ICD-10-CM

## 2016-03-01 HISTORY — DX: Essential (primary) hypertension: I10

## 2016-03-03 ENCOUNTER — Telehealth: Payer: Self-pay | Admitting: Cardiovascular Disease

## 2016-03-03 NOTE — Telephone Encounter (Signed)
Request routed to [facility].

## 2016-03-03 NOTE — Telephone Encounter (Signed)
Pt needs prescription to get C-Pap machine supplies. That includes mask,tubing,filters and water chambers.

## 2016-03-09 NOTE — Telephone Encounter (Signed)
Routing to [employer].

## 2016-03-09 NOTE — Telephone Encounter (Signed)
Gary [name] wants to know if Dr Claiborne Billings have signed orders for pt to get his C Pap supplies?

## 2016-03-10 NOTE — Telephone Encounter (Signed)
Spoke with patient and he stated he has been having a hard time getting the supplies he needs with the "new" company, been working on this for a while Advised patient I would make sure that Mariann Laster got this when she was back in the office, will forward to her for review

## 2016-03-10 NOTE — Telephone Encounter (Signed)
Follow up     Pt is calling to get an update on getting CPAP supplies from [facility].  [facility] states that they have not received an order from [place].  Please call pt and let him know

## 2016-03-14 ENCOUNTER — Telehealth: Payer: Self-pay

## 2016-03-14 ENCOUNTER — Encounter: Payer: Self-pay | Admitting: Family Medicine

## 2016-03-14 ENCOUNTER — Ambulatory Visit (INDEPENDENT_AMBULATORY_CARE_PROVIDER_SITE_OTHER): Payer: Medicare Other | Admitting: Family Medicine

## 2016-03-14 DIAGNOSIS — R030 Elevated blood-pressure reading, without diagnosis of hypertension: Secondary | ICD-10-CM

## 2016-03-14 DIAGNOSIS — I10 Essential (primary) hypertension: Secondary | ICD-10-CM | POA: Diagnosis not present

## 2016-03-14 DIAGNOSIS — Z23 Encounter for immunization: Secondary | ICD-10-CM

## 2016-03-14 LAB — CBC WITH DIFFERENTIAL/PLATELET
BASOS PCT: 0.3 % (ref 0.0–3.0)
Basophils Absolute: 0 10*3/uL (ref 0.0–0.1)
EOS ABS: 0.1 10*3/uL (ref 0.0–0.7)
Eosinophils Relative: 1.2 % (ref 0.0–5.0)
HCT: 49.9 % (ref 39.0–52.0)
Hemoglobin: 16.8 g/dL (ref 13.0–17.0)
LYMPHS ABS: 1.3 10*3/uL (ref 0.7–4.0)
Lymphocytes Relative: 17.8 % (ref 12.0–46.0)
MCHC: 33.7 g/dL (ref 30.0–36.0)
MCV: 95.8 fl (ref 78.0–100.0)
MONO ABS: 0.9 10*3/uL (ref 0.1–1.0)
Monocytes Relative: 11.8 % (ref 3.0–12.0)
NEUTROS ABS: 5.1 10*3/uL (ref 1.4–7.7)
NEUTROS PCT: 68.9 % (ref 43.0–77.0)
PLATELETS: 153 10*3/uL (ref 150.0–400.0)
RBC: 5.21 Mil/uL (ref 4.22–5.81)
RDW: 13.6 % (ref 11.5–15.5)
WBC: 7.4 10*3/uL (ref 4.0–10.5)

## 2016-03-14 LAB — BASIC METABOLIC PANEL
BUN: 26 mg/dL — ABNORMAL HIGH (ref 6–23)
CALCIUM: 9.3 mg/dL (ref 8.4–10.5)
CO2: 29 mEq/L (ref 19–32)
Chloride: 106 mEq/L (ref 96–112)
Creatinine, Ser: 1.04 mg/dL (ref 0.40–1.50)
GFR: 73.41 mL/min (ref >60.00)
GLUCOSE: 95 mg/dL (ref 70–99)
Potassium: 5.2 mEq/L — ABNORMAL HIGH (ref 3.5–5.1)
SODIUM: 142 meq/L (ref 135–145)

## 2016-03-14 LAB — TSH: TSH: 2.15 u[IU]/mL (ref 0.35–4.50)

## 2016-03-14 MED ORDER — AMLODIPINE BESYLATE 5 MG PO TABS: 5.0000 mg | ORAL_TABLET | Freq: Every day | ORAL | 3 refills | Status: DC

## 2016-03-14 MED ORDER — LISINOPRIL-HYDROCHLOROTHIAZIDE 20-12.5 MG PO TABS: 1.0000 | ORAL_TABLET | Freq: Every day | ORAL | 3 refills | Status: DC

## 2016-03-14 NOTE — Patient Instructions (Addendum)
Omron pump up digital. Blood pressure cuff [employer]  Check your blood pressure 3 times daily  Stay at complete bedrest.......[name] get up to go the bathroom get up to eat otherwise complete bed rest  Salt free diet  Zestoretic 20-12 0.5 .......[name] 1 daily  Return tomorrow at 11 AM for follow-up  Labs today .....Marland KitchenMarland Kitchen

## 2016-03-14 NOTE — Telephone Encounter (Signed)
Following up

## 2016-03-14 NOTE — Telephone Encounter (Signed)
Following on prescription for his C Pap supplies.

## 2016-03-14 NOTE — Telephone Encounter (Signed)
Do not need this encounter °

## 2016-03-14 NOTE — Progress Notes (Signed)
Pre visit review using our clinic review tool, if applicable. No additional management support is needed unless otherwise documented below in the visit note. 

## 2016-03-14 NOTE — Progress Notes (Signed)
Gary [name] is a 80 year old married male nonsmoker who comes in today for evaluation of hypertension  He is never had any difficulty with high blood pressure in the past.  Paternal grandfather father and brother all have hypertension  May 2017 he experienced a left macular bleed. He's been treated by ophthalmologist in [place]. He's been monitoring his blood pressure at home. He brings in readings that are averaging 160/95.  He's asymptomatic.  Cardiac wise she's had a history of atrial flutter in the past. Also history of aortic stenosis.  Physical evaluation weight 250 pounds, afebrile, pulse 60 and regular, BP right arm sitting position to 10/100. He was immediately given clonidine 0.4 by mouth and BP checked every 15 minutes.  BP check every 15 minutes 2 hours. Blood pressure now is 160/100  Impression new-onset hypertension.........[name] bedrest at home... No salt diet.... Begin Zestoretic 20-12 0.5 daily BP check 3 times daily follow-up in 24 hours

## 2016-03-15 ENCOUNTER — Ambulatory Visit (INDEPENDENT_AMBULATORY_CARE_PROVIDER_SITE_OTHER): Payer: Medicare Other | Admitting: Family Medicine

## 2016-03-15 ENCOUNTER — Encounter: Payer: Self-pay | Admitting: Family Medicine

## 2016-03-15 VITALS — BP 142/82 | HR 55 | Temp 98.2°F | Wt 250.2 lb

## 2016-03-15 DIAGNOSIS — I10 Essential (primary) hypertension: Secondary | ICD-10-CM | POA: Diagnosis not present

## 2016-03-15 MED ORDER — LISINOPRIL-HYDROCHLOROTHIAZIDE 20-12.5 MG PO TABS: 1.0000 | ORAL_TABLET | Freq: Every day | ORAL | 3 refills | Status: DC

## 2016-03-15 NOTE — Patient Instructions (Signed)
Continue blood pressure medicine one tablet daily  Check your blood pressure daily in the morning  Follow-up in 3 weeks sooner if any problems

## 2016-03-15 NOTE — Progress Notes (Signed)
Pre visit review using our clinic review tool, if applicable. No additional management support is needed unless otherwise documented below in the visit note. 

## 2016-03-15 NOTE — Progress Notes (Signed)
[name] is a 80 year old married male nonsmoker who comes in today for evaluation of hypertension  We saw him yesterday with marked elevation of his blood pressure. It was 210/110 when he came in. We checked it repeatedly and it remained that elevated. We therefore gave him clonidine 0.2 and checked his blood pressure every 15 minutes until begin to come down. Be treat BP drop down to 160/100 and he was sent home on Zestoretic 20-12 0.5 daily. Took his first dose last night another diagnosis morning no hives.  BP at home 130/80 this morning. BP here 142/82  Labs checked yesterday were normal. We'll recheck renal function after he's been on the medicine for couple weeks  Physical exam vital signs stable he is afebrile BP right arm sitting position 142/82 pulse 70 and regular  Impression hypertension at goal,,,,,,,,,,, continue current medication follow-up in 3 weeks. Check blood pressure daily at home,

## 2016-03-17 ENCOUNTER — Other Ambulatory Visit: Payer: Self-pay | Admitting: *Deleted

## 2016-03-17 ENCOUNTER — Encounter: Payer: Self-pay | Admitting: Cardiovascular Disease

## 2016-03-17 DIAGNOSIS — G4733 Obstructive sleep apnea (adult) (pediatric): Secondary | ICD-10-CM

## 2016-03-17 NOTE — Telephone Encounter (Signed)
Pt calling [facility], feels he is getting the run around with [facility] regarding these CPAP supplies, they are telling him they can't get the written order needed from [place] to refill his supplies. He states he needs a new filter, nose buds, and  Tubing. Would like a nurse call as to what the issue may be and if we could fax that written order to [phone]

## 2016-03-21 ENCOUNTER — Telehealth: Payer: Self-pay | Admitting: Cardiovascular Disease

## 2016-03-21 NOTE — Telephone Encounter (Signed)
New message  Waiting for Mariann Laster since August  Supplies for [place] for fulfillment  Please follow up  Please call

## 2016-03-27 ENCOUNTER — Telehealth: Payer: Self-pay

## 2016-03-27 NOTE — Telephone Encounter (Signed)
Received a call from patient.He stated he just spoke to [place].He has appointment this Fri 03/31/16 to pick up cpap supplies.

## 2016-03-27 NOTE — Telephone Encounter (Signed)
[facility] called left 2nd message for Gary [name] to return my call about ordering patient's cpap supplies.

## 2016-03-27 NOTE — Telephone Encounter (Signed)
Received a call from patient's wife.She stated she was calling to speak to Gary [name].Stated she is upset, her husband has been waiting for a prescription for cpap supplies.Gary [name] out of office today.Advised I will call [place]. [place] called [phone].Left message for Gary [name] at ext [phone] to return my call.

## 2016-04-05 ENCOUNTER — Ambulatory Visit (INDEPENDENT_AMBULATORY_CARE_PROVIDER_SITE_OTHER): Payer: Medicare Other | Admitting: Family Medicine

## 2016-04-05 ENCOUNTER — Encounter: Payer: Self-pay | Admitting: Family Medicine

## 2016-04-05 DIAGNOSIS — I10 Essential (primary) hypertension: Secondary | ICD-10-CM | POA: Diagnosis not present

## 2016-04-05 LAB — BASIC METABOLIC PANEL WITH GFR
BUN: 38 mg/dL — ABNORMAL HIGH (ref 6–23)
CO2: 29 meq/L (ref 19–32)
Calcium: 8.7 mg/dL (ref 8.4–10.5)
Chloride: 101 meq/L (ref 96–112)
Creatinine, Ser: 1.31 mg/dL (ref 0.40–1.50)
GFR: 56.24 mL/min — ABNORMAL LOW
Glucose, Bld: 103 mg/dL — ABNORMAL HIGH (ref 70–99)
Potassium: 5.1 meq/L (ref 3.5–5.1)
Sodium: 137 meq/L (ref 135–145)

## 2016-04-05 MED ORDER — SIMVASTATIN 40 MG PO TABS: 20.0000 mg | ORAL_TABLET | ORAL | 4 refills | Status: DC

## 2016-04-05 NOTE — Patient Instructions (Signed)
Continue current medication  Since your blood pressure is normal check your blood pressure weekly  Return this spring for general physical exam  Labs fasting one week prior  Labs today to check renal function

## 2016-04-05 NOTE — Progress Notes (Signed)
Gary [name] is a 80 year old married male nonsmoker who comes in today follow-up of hypertension  We saw him a couple weeks ago with marked elevation of his blood pressure. We started him on Zestoretic 20-12 0.5 daily and he's been monitoring his blood pressure at home. He can back after week on his medicine his pressure had dropped down. He comes back now for a three-week follow-up. BP at home are averaging 120/80. He's not lightheaded when he stands up. No hives no cough.  Physical evaluation....................................BP 128/70 (BP Location: Left Arm, Patient Position: Sitting, Cuff Size: Large)   Pulse 70   Temp 98.2 F (36.8 C) (Oral)   Ht 6\' 3"  (1.905 m)   Wt 242 lb (109.8 kg)   SpO2 97%   BMI 30.25 kg/m  Blood pressure right arm sitting position confirmed 120/80  Impression hypertension at goal.....[name] continue current therapy.. BP check weekly.... Recheck renal function..... Follow-up in one year sooner if any problems

## 2016-04-20 NOTE — Telephone Encounter (Signed)
I have no idea what this man is speaking of. When we saw him last in September we just told him we would see him in 1 year. His supplies comes from the [employer] company. Unless he is saying that he needs a referral to a different company.

## 2016-04-20 NOTE — Telephone Encounter (Signed)
Upon chart review, issue handled by Malachy Mood, LPN on [id number] (see below)  Luanna Salk, LPN  [id number]  Note    Received a call from patient.He stated he just spoke to [place].He has appointment this Fri 03/31/16 to pick up cpap supplies.

## 2016-04-21 NOTE — Telephone Encounter (Signed)
CPAP supply orders were placed and signed in the computer for [facility] in November. Staff message sent to Darlina Guys to follow up and contact patient.

## 2016-04-26 DIAGNOSIS — H43813 Vitreous degeneration, bilateral: Secondary | ICD-10-CM | POA: Diagnosis not present

## 2016-04-26 DIAGNOSIS — H3562 Retinal hemorrhage, left eye: Secondary | ICD-10-CM | POA: Diagnosis not present

## 2016-04-26 DIAGNOSIS — H35371 Puckering of macula, right eye: Secondary | ICD-10-CM | POA: Diagnosis not present

## 2016-04-26 DIAGNOSIS — H34832 Tributary (branch) retinal vein occlusion, left eye, with macular edema: Secondary | ICD-10-CM | POA: Diagnosis not present

## 2016-06-07 ENCOUNTER — Other Ambulatory Visit (INDEPENDENT_AMBULATORY_CARE_PROVIDER_SITE_OTHER): Payer: Medicare Other

## 2016-06-07 DIAGNOSIS — Z0000 Encounter for general adult medical examination without abnormal findings: Secondary | ICD-10-CM | POA: Diagnosis not present

## 2016-06-07 LAB — BASIC METABOLIC PANEL
BUN: 29 mg/dL — AB (ref 6–23)
CO2: 31 mEq/L (ref 19–32)
Calcium: 9 mg/dL (ref 8.4–10.5)
Chloride: 106 mEq/L (ref 96–112)
Creatinine, Ser: 1.16 mg/dL (ref 0.40–1.50)
GFR: 64.68 mL/min (ref >60.00)
GLUCOSE: 117 mg/dL — AB (ref 70–99)
Potassium: 5 mEq/L (ref 3.5–5.1)
Sodium: 142 mEq/L (ref 135–145)

## 2016-06-07 LAB — POC URINALSYSI DIPSTICK (AUTOMATED)
BILIRUBIN UA: NEGATIVE
Blood, UA: NEGATIVE
GLUCOSE UA: NEGATIVE
Ketones, UA: NEGATIVE
LEUKOCYTES UA: NEGATIVE
NITRITE UA: NEGATIVE
Spec Grav, UA: 1.025
UROBILINOGEN UA: 0.2
pH, UA: 5.5

## 2016-06-07 LAB — HEPATIC FUNCTION PANEL
ALK PHOS: 60 U/L (ref 39–117)
ALT: 24 U/L (ref 0–53)
AST: 19 U/L (ref 0–37)
Albumin: 3.9 g/dL (ref 3.5–5.2)
BILIRUBIN DIRECT: 0.2 mg/dL (ref 0.0–0.3)
BILIRUBIN TOTAL: 0.8 mg/dL (ref 0.2–1.2)
Total Protein: 6.6 g/dL (ref 6.0–8.3)

## 2016-06-07 LAB — CBC WITH DIFFERENTIAL/PLATELET
BASOS ABS: 0 10*3/uL (ref 0.0–0.1)
Basophils Relative: 0.5 % (ref 0.0–3.0)
EOS PCT: 3.3 % (ref 0.0–5.0)
Eosinophils Absolute: 0.2 10*3/uL (ref 0.0–0.7)
HCT: 46 % (ref 39.0–52.0)
HEMOGLOBIN: 15.7 g/dL (ref 13.0–17.0)
Lymphocytes Relative: 26 % (ref 12.0–46.0)
Lymphs Abs: 1.5 10*3/uL (ref 0.7–4.0)
MCHC: 34.1 g/dL (ref 30.0–36.0)
MCV: 97.4 fl (ref 78.0–100.0)
MONO ABS: 0.7 10*3/uL (ref 0.1–1.0)
Monocytes Relative: 12.3 % — ABNORMAL HIGH (ref 3.0–12.0)
NEUTROS PCT: 57.9 % (ref 43.0–77.0)
Neutro Abs: 3.3 10*3/uL (ref 1.4–7.7)
Platelets: 148 10*3/uL — ABNORMAL LOW (ref 150.0–400.0)
RBC: 4.72 Mil/uL (ref 4.22–5.81)
RDW: 13.9 % (ref 11.5–15.5)
WBC: 5.6 10*3/uL (ref 4.0–10.5)

## 2016-06-07 LAB — LIPID PANEL
CHOL/HDL RATIO: 4
CHOLESTEROL: 164 mg/dL (ref 0–200)
HDL: 38.9 mg/dL — AB (ref >39.00)
LDL CALC: 105 mg/dL — AB (ref 0–99)
NonHDL: 125.16
TRIGLYCERIDES: 101 mg/dL (ref 0.0–149.0)
VLDL: 20.2 mg/dL (ref 0.0–40.0)

## 2016-06-07 LAB — TSH: TSH: 2.58 u[IU]/mL (ref 0.35–4.50)

## 2016-06-07 LAB — PSA: PSA: 1.03 ng/mL (ref 0.10–4.00)

## 2016-06-14 ENCOUNTER — Encounter: Payer: Self-pay | Admitting: Family Medicine

## 2016-06-14 ENCOUNTER — Ambulatory Visit (INDEPENDENT_AMBULATORY_CARE_PROVIDER_SITE_OTHER): Payer: Medicare Other | Admitting: Family Medicine

## 2016-06-14 VITALS — BP 110/60 | HR 57 | Temp 97.6°F | Ht 74.0 in | Wt 245.5 lb

## 2016-06-14 DIAGNOSIS — I350 Nonrheumatic aortic (valve) stenosis: Secondary | ICD-10-CM | POA: Diagnosis not present

## 2016-06-14 DIAGNOSIS — F528 Other sexual dysfunction not due to a substance or known physiological condition: Secondary | ICD-10-CM | POA: Diagnosis not present

## 2016-06-14 DIAGNOSIS — I483 Typical atrial flutter: Secondary | ICD-10-CM | POA: Diagnosis not present

## 2016-06-14 DIAGNOSIS — R351 Nocturia: Secondary | ICD-10-CM

## 2016-06-14 DIAGNOSIS — N401 Enlarged prostate with lower urinary tract symptoms: Secondary | ICD-10-CM | POA: Diagnosis not present

## 2016-06-14 DIAGNOSIS — E7800 Pure hypercholesterolemia, unspecified: Secondary | ICD-10-CM

## 2016-06-14 DIAGNOSIS — G4733 Obstructive sleep apnea (adult) (pediatric): Secondary | ICD-10-CM | POA: Diagnosis not present

## 2016-06-14 DIAGNOSIS — I10 Essential (primary) hypertension: Secondary | ICD-10-CM | POA: Diagnosis not present

## 2016-06-14 MED ORDER — LISINOPRIL-HYDROCHLOROTHIAZIDE 10-12.5 MG PO TABS: 1.0000 | ORAL_TABLET | Freq: Every day | ORAL | 4 refills | Status: DC

## 2016-06-14 MED ORDER — SIMVASTATIN 20 MG PO TABS: 20.0000 mg | ORAL_TABLET | Freq: Every day | ORAL | 4 refills | Status: DC

## 2016-06-14 NOTE — Patient Instructions (Signed)
Decrease the Zestoretic from 20 mg daily to 10........... do not take any blood pressure medication tomorrow............ start the 10 mg dose on Friday  Check your blood pressure daily for 1 month to be sure it's at goal..........[name] 0000000 systolic...Marland KitchenMarland Kitchen [id number] diastolic  If there is a question about your blood pressure: A month  Continue good health habits..........[name] because of your glucose intolerance I would avoid sugar  Return in one year for general physical exam sooner if any problems

## 2016-06-14 NOTE — Progress Notes (Signed)
Gary [name] is a 81 year old married male nonsmoker who comes in today for evaluation of hypertension hyperlipidemia  He is on Zestoretic 20-12 0.5 for hypertension. He also has A. fib. On Saturday he was working in yard bending over still it up and felt lightheaded. He went into the house and checked his blood pressure was 155/74 his heart rate was 120. He rested for a while blood pressure dropped down over 12:40 20/68 heart rate is 76. He had no angina no shortness of breath etc. Today's blood pressures 110/60. I suspect he had a presyncopal episode related to the fact that his blood pressures drop too low.  He takes Zocor 20 mg daily for hyperlipidemia.  He sees cardiologist periodically. He has history of A. fib. Is currently in sinus rhythm. He is on E LI QUIS.  He sees his ophthalmologist now every month. Skin injection his left eye. He had a bleed in that eye. Vision is pretty much back to normal.  He gets regular dental care. Colonoscopy 2012 was normal  Seasonal flu shot was done September 2017. Tetanus booster was done January 2018 the drugstore  14 point review of systems reviewed otherwise negative  BP 110/60 (BP Location: Left Arm, Patient Position: Sitting, Cuff Size: Normal)   Pulse (!) 57   Temp 97.6 F (36.4 C) (Oral)   Ht 6\' 2"  (1.88 m)   Wt 245 lb 8 oz (111.4 kg)   BMI 31.52 kg/m  He is a well-developed well-nourished male no acute distress vital signs stable he is afebrile HEENT were negative neck was supple no adenopathy no adenopathy thyroid normal no carotid bruits lungs are clear to auscultation cardiac exam normal he is in sinus rhythm at this juncture. Abdominal exam negative genitalia normal circumcised male rectum normal stool guaiac-negative prostate smooth nonnodular 1+ BPH  Extremities normal skin normal peripheral pulses normal except he has numerous seborrheic keratosis on his back.  #1 hypertension.......... BP too low.... Presyncopal episode doing yard work last  Saturday........ decrease ace from 20-10 mg daily  #2 hyperlipidemia........ continue Zocor 20 mg daily  #3 A. flutter .....[name] continue blood thinner  #4 history of sleep apnea.......... continue CPAP  #5 glucose intolerance................ continue diet exercise......... avoid raw sugar

## 2016-06-14 NOTE — Progress Notes (Signed)
Pre visit review using our clinic review tool, if applicable. No additional management support is needed unless otherwise documented below in the visit note. 

## 2016-07-19 DIAGNOSIS — H34232 Retinal artery branch occlusion, left eye: Secondary | ICD-10-CM | POA: Diagnosis not present

## 2016-07-19 DIAGNOSIS — H3562 Retinal hemorrhage, left eye: Secondary | ICD-10-CM | POA: Diagnosis not present

## 2016-07-19 DIAGNOSIS — H34832 Tributary (branch) retinal vein occlusion, left eye, with macular edema: Secondary | ICD-10-CM | POA: Diagnosis not present

## 2016-07-19 DIAGNOSIS — H43813 Vitreous degeneration, bilateral: Secondary | ICD-10-CM | POA: Diagnosis not present

## 2016-07-21 ENCOUNTER — Other Ambulatory Visit (HOSPITAL_COMMUNITY): Payer: Self-pay | Admitting: Nurse Practitioner

## 2016-07-21 DIAGNOSIS — C44729 Squamous cell carcinoma of skin of left lower limb, including hip: Secondary | ICD-10-CM | POA: Diagnosis not present

## 2016-07-21 DIAGNOSIS — D485 Neoplasm of uncertain behavior of skin: Secondary | ICD-10-CM | POA: Diagnosis not present

## 2016-07-21 DIAGNOSIS — Z85828 Personal history of other malignant neoplasm of skin: Secondary | ICD-10-CM | POA: Diagnosis not present

## 2016-07-21 DIAGNOSIS — L820 Inflamed seborrheic keratosis: Secondary | ICD-10-CM | POA: Diagnosis not present

## 2016-07-21 DIAGNOSIS — L720 Epidermal cyst: Secondary | ICD-10-CM | POA: Diagnosis not present

## 2016-07-21 DIAGNOSIS — L570 Actinic keratosis: Secondary | ICD-10-CM | POA: Diagnosis not present

## 2016-07-21 DIAGNOSIS — I6523 Occlusion and stenosis of bilateral carotid arteries: Secondary | ICD-10-CM

## 2016-07-27 ENCOUNTER — Ambulatory Visit (HOSPITAL_COMMUNITY)
Admission: RE | Admit: 2016-07-27 | Discharge: 2016-07-27 | Disposition: A | Discharge status: Home / Self Care | Payer: Medicare Other | Source: Ambulatory Visit | Attending: Cardiovascular Disease | Admitting: Cardiovascular Disease

## 2016-07-27 DIAGNOSIS — E785 Hyperlipidemia, unspecified: Secondary | ICD-10-CM | POA: Diagnosis not present

## 2016-07-27 DIAGNOSIS — I10 Essential (primary) hypertension: Secondary | ICD-10-CM | POA: Diagnosis not present

## 2016-07-27 DIAGNOSIS — Z87891 Personal history of nicotine dependence: Secondary | ICD-10-CM | POA: Diagnosis not present

## 2016-07-27 DIAGNOSIS — I6523 Occlusion and stenosis of bilateral carotid arteries: Secondary | ICD-10-CM | POA: Diagnosis not present

## 2016-09-04 ENCOUNTER — Telehealth: Payer: Self-pay | Admitting: Family Medicine

## 2016-09-04 NOTE — Telephone Encounter (Signed)
Patient had labs done on 06/07/2016 that were coded at Z00.00.  This is not an allowable dx for Medicare to cover.  Can you please look to see what other codes could be used instead of the Z00.00 and let Aron Baba know?

## 2016-09-05 NOTE — Telephone Encounter (Signed)
PSA - N40.1 Lipid - E78.5 Hepatic - E78.5 BMP - I10, E78.5 CBC - I10, E78.5 TSH - E78.5 Urine - I10

## 2016-09-06 NOTE — Telephone Encounter (Signed)
Gary [name],   I cannot send for correction since these are not specified in any previous documentation or on lab order...  Sorry,  [name]

## 2016-09-11 NOTE — Telephone Encounter (Signed)
Please document these in your note from 06/14/16.

## 2016-09-13 NOTE — Telephone Encounter (Signed)
I spoke with Dawn about this last week, but didn't have a chance to update you [place] and [facility]- Unfortunately, there is nothing that can be done about this bill now. Dawn said even by having Dr. Sherren Mocha add an addendum would not correct it at this point.

## 2016-09-15 NOTE — Telephone Encounter (Signed)
Not sure what to do with the billing issue.

## 2016-10-11 NOTE — Telephone Encounter (Signed)
Patient came in for evaluation of hypertension, hyperlipidemia, cardiac arrhythmia, and BPH with nocturia please refile the labs under these diagnoses

## 2016-10-12 NOTE — Telephone Encounter (Signed)
Unable to change billing.

## 2016-10-13 DIAGNOSIS — L821 Other seborrheic keratosis: Secondary | ICD-10-CM | POA: Diagnosis not present

## 2016-10-13 DIAGNOSIS — L812 Freckles: Secondary | ICD-10-CM | POA: Diagnosis not present

## 2016-10-13 DIAGNOSIS — L918 Other hypertrophic disorders of the skin: Secondary | ICD-10-CM | POA: Diagnosis not present

## 2016-10-13 DIAGNOSIS — Z85828 Personal history of other malignant neoplasm of skin: Secondary | ICD-10-CM | POA: Diagnosis not present

## 2016-10-13 DIAGNOSIS — D485 Neoplasm of uncertain behavior of skin: Secondary | ICD-10-CM | POA: Diagnosis not present

## 2016-10-13 DIAGNOSIS — L570 Actinic keratosis: Secondary | ICD-10-CM | POA: Diagnosis not present

## 2016-10-13 DIAGNOSIS — C44529 Squamous cell carcinoma of skin of other part of trunk: Secondary | ICD-10-CM | POA: Diagnosis not present

## 2016-10-13 DIAGNOSIS — D1801 Hemangioma of skin and subcutaneous tissue: Secondary | ICD-10-CM | POA: Diagnosis not present

## 2016-11-16 DIAGNOSIS — H34232 Retinal artery branch occlusion, left eye: Secondary | ICD-10-CM | POA: Diagnosis not present

## 2016-11-16 DIAGNOSIS — H35031 Hypertensive retinopathy, right eye: Secondary | ICD-10-CM | POA: Diagnosis not present

## 2016-11-16 DIAGNOSIS — H43813 Vitreous degeneration, bilateral: Secondary | ICD-10-CM | POA: Diagnosis not present

## 2016-11-16 DIAGNOSIS — H34832 Tributary (branch) retinal vein occlusion, left eye, with macular edema: Secondary | ICD-10-CM | POA: Diagnosis not present

## 2016-11-23 ENCOUNTER — Other Ambulatory Visit: Payer: Self-pay | Admitting: Family Medicine

## 2016-12-22 ENCOUNTER — Ambulatory Visit (INDEPENDENT_AMBULATORY_CARE_PROVIDER_SITE_OTHER): Payer: Medicare Other | Admitting: Cardiovascular Disease

## 2016-12-22 ENCOUNTER — Encounter: Payer: Self-pay | Admitting: Cardiovascular Disease

## 2016-12-22 VITALS — BP 140/80 | HR 58 | Ht 74.0 in | Wt 251.0 lb

## 2016-12-22 DIAGNOSIS — Z7901 Long term (current) use of anticoagulants: Secondary | ICD-10-CM | POA: Diagnosis not present

## 2016-12-22 DIAGNOSIS — E7800 Pure hypercholesterolemia, unspecified: Secondary | ICD-10-CM

## 2016-12-22 DIAGNOSIS — I6523 Occlusion and stenosis of bilateral carotid arteries: Secondary | ICD-10-CM

## 2016-12-22 DIAGNOSIS — G4733 Obstructive sleep apnea (adult) (pediatric): Secondary | ICD-10-CM

## 2016-12-22 DIAGNOSIS — I483 Typical atrial flutter: Secondary | ICD-10-CM

## 2016-12-22 NOTE — Patient Instructions (Signed)
Dr Croitoru recommends that you schedule a follow-up appointment in 12 months. You will receive a reminder letter in the mail two months in advance. If you don't receive a letter, please call our office to schedule the follow-up appointment.  If you need a refill on your cardiac medications before your next appointment, please call your pharmacy. 

## 2016-12-22 NOTE — Progress Notes (Signed)
Cardiology Office Note    Date:  12/24/2016   ID:  Gary [name], DOB 09-24-1935, MRN [id number]  PCP:  Dorena Cookey, MD  Cardiologist:   [name], MD   Chief Complaint  Patient presents with  . Follow-up    no complaints     History of Present Illness:  Gary [name] is a 81 y.o. male with asymptomatic atrial flutter with spontaneously controlled ventricular rate, recurrent following previous cardioversion without significant structural heart disease. He returns for routine follow-up.   He exercises with a trainer twice a week and plays golf 2 or 3 times a week. He does not feel limited by any cardiovascular complaints. He remains unaware of palpitations. He has not had dizziness or syncope.  Today his rhythm is typical counterclockwise atrial flutter with variable block and a ventricular rate of 58 bpm.  The patient specifically denies any chest pain at rest or with exertion, dyspnea at rest or with exertion, orthopnea, paroxysmal nocturnal dyspnea, syncope, palpitations, focal neurological deficits, intermittent claudication, lower extremity edema, unexplained weight gain, cough, hemoptysis or wheezing.  The patient also denies abdominal pain, nausea, vomiting, dysphagia, diarrhea, constipation, polyuria, polydipsia, dysuria, hematuria, frequency, urgency, abnormal bleeding or bruising, fever, chills, unexpected weight changes, mood swings, change in skin or hair texture, change in voice quality, auditory or visual problems, allergic reactions or rashes, new musculoskeletal complaints other than usual "aches and pains".  Previous workup with echocardiography shows aortic valve sclerosis without stenosis and normal left ventricular function. He has a markedly dilated left atrium. He has obstructive sleep apnea and is compliant with CPAP.  Past Medical History:  Diagnosis Date  . Adenomatous colon polyp   . ALLERGIC RHINITIS 10/03/2007  . ASTHMA, ACUTE 08/25/2009  .  Atrial flutter ([place]) 06/26/2014  . CARDIAC MURMUR, AORTIC 10/03/2007  . ERECTILE DYSFUNCTION 10/03/2007  . High blood pressure 03/2016  . HYPERLIPIDEMIA 10/03/2007  . Rosacea     Past Surgical History:  Procedure Laterality Date  . ADENOIDECTOMY    . CARDIOVERSION N/A 07/01/2014   Procedure: CARDIOVERSION;  Surgeon: [name], MD;  [facility];  Service: Cardiovascular;  Laterality: N/A;  . COLONOSCOPY    . TEE WITHOUT CARDIOVERSION N/A 07/01/2014   Procedure: TRANSESOPHAGEAL ECHOCARDIOGRAM (TEE);  Surgeon: [name], MD;  Location: [facility] ENDOSCOPY;  Service: Cardiovascular;  Laterality: N/A;  . TONSILLECTOMY      Current Medications: Outpatient Medications Prior to Visit  Medication Sig Dispense Refill  . apixaban (ELIQUIS) 5 MG TABS tablet Take 1 tablet (5 mg total) by mouth 2 (two) times daily. 180 tablet 3  . co-enzyme Q-10 30 MG capsule Take 30 mg by mouth every other day.     . fluticasone (FLONASE) 50 MCG/ACT nasal spray Place 1 spray into both nostrils every other day.    . minocycline (MINOCIN,DYNACIN) 100 MG capsule TAKE 1 CAPSULE EVERY DAY 90 capsule 3  . Multiple Vitamin (MULTIVITAMIN) tablet Take 1 tablet by mouth daily.    . simvastatin (ZOCOR) 20 MG tablet Take 1 tablet (20 mg total) by mouth at bedtime. 90 tablet 4  . lisinopril-hydrochlorothiazide (PRINZIDE,ZESTORETIC) 10-12.5 MG tablet Take 1 tablet by mouth daily. (Patient not taking: Reported on 12/22/2016) 90 tablet 4   No facility-administered medications prior to visit.      Allergies:   Patient has no known allergies.   Social History   Social History  . Marital status: Married    Spouse name: N/A  . Number  of children: N/A  . Years of education: N/A   Social History Main Topics  . Smoking status: Former Research scientist (life sciences)  . Smokeless tobacco: Never Used  . Alcohol use 4.8 oz/week    8 Glasses of wine per week     Comment: ocassionally   . Drug use: No  . Sexual activity: Not Asked   Other Topics  Concern  . None   Social History Narrative  . None     Family History:  The patient's family history includes Cancer in his father; Diabetes in his mother; Liver cancer in his father.   ROS:   Please see the history of present illness.    ROS All other systems reviewed and are negative.   PHYSICAL EXAM:   VS:  BP 140/80 (BP Location: Left Arm, Patient Position: Sitting, Cuff Size: Normal)   Pulse (!) 58   Ht 6\' 2"  (1.88 m)   Wt 251 lb (113.9 kg)   BMI 32.23 kg/m     General: Alert, oriented x3, no distress Head: no evidence of trauma, PERRL, EOMI, no exophtalmos or lid lag, no myxedema, no xanthelasma; normal ears, nose and oropharynx Neck: normal jugular venous pulsations and no hepatojugular reflux; brisk carotid pulses without delay and no carotid bruits Chest: clear to auscultation, no signs of consolidation by percussion or palpation, normal fremitus, symmetrical and full respiratory excursions Cardiovascular: normal position and quality of the apical impulse, irregular rhythm, normal first and second heart sounds, 2/6 early peaking aortic ejection murmur, no diastolic murmurs, rubs or gallops Abdomen: no tenderness or distention, no masses by palpation, no abnormal pulsatility or arterial bruits, normal bowel sounds, no hepatosplenomegaly Extremities: no clubbing, cyanosis or edema; 2+ radial, ulnar and brachial pulses bilaterally; 2+ right femoral, posterior tibial and dorsalis pedis pulses; 2+ left femoral, posterior tibial and dorsalis pedis pulses; no subclavian or femoral bruits Neurological: grossly nonfocal Psych: euthymic mood, full affect  Wt Readings from Last 3 Encounters:  12/22/16 251 lb (113.9 kg)  06/14/16 245 lb 8 oz (111.4 kg)  04/05/16 242 lb (109.8 kg)      Studies/Labs Reviewed:   EKG:  EKG is ordered today.  The ekg ordered today demonstrates Typical counterclockwise atrial flutter with variable, mostly 4:1 atrioventricular block  Recent  Labs: 06/07/2016: ALT 24; BUN 29; Creatinine, Ser 1.16; Hemoglobin 15.7; Platelets 148.0; Potassium 5.0; Sodium 142; TSH 2.58   Lipid Panel    Component Value Date/Time   CHOL 136 05/24/2015 0828   TRIG 78.0 05/24/2015 0828   HDL 41.30 05/24/2015 0828   CHOLHDL 3 05/24/2015 0828   VLDL 15.6 05/24/2015 0828   LDLCALC 79 05/24/2015 0828    LDLDIRECT  143.5  01/22/2012 0916     ASSESSMENT:    1. Typical atrial flutter ([place])   2. Long term current use of anticoagulant   3. Obstructive sleep apnea   4. Pure hypercholesterolemia      PLAN:  In order of problems listed above:  1. AFlutter: Asymptomatic. Compliant with anticoagulation.CHADSVasc 3 (age, HTN). Rate is spontaneously well-controlled, consistent with significant AV conduction abnormalities. Avoid any medications that could cause worsening bradycardia. Call us promptly should he develop dizziness or syncope.  2. Eliquis: Well-tolerated, no bleeding problems 3. OSA: Compliant with CPAP. Sees Dr. Claiborne Billings. 4. HLP: Satisfactory lipid profile on statin therapy. Does not have known coronary peripheral vascular lesions.    Medication Adjustments/Labs and Tests Ordered: Current medicines are reviewed at length with the patient today.  Concerns regarding  medicines are outlined above.  Medication changes, Labs and Tests ordered today are listed in the Patient Instructions below. Patient Instructions  Dr Sallyanne Kuster recommends that you schedule a follow-up appointment in 12 months. You will receive a reminder letter in the mail two months in advance. If you don't receive a letter, please call our office to schedule the follow-up appointment.  If you need a refill on your cardiac medications before your next appointment, please call your pharmacy.    Signed, [name], MD  12/24/2016 3:08 PM    [facility] [address], [place], [place]  [address] Phone: [phone]; Fax: [phone]

## 2016-12-25 ENCOUNTER — Other Ambulatory Visit: Payer: Self-pay | Admitting: Cardiovascular Disease

## 2017-01-30 ENCOUNTER — Telehealth: Payer: Self-pay | Admitting: *Deleted

## 2017-01-30 NOTE — Telephone Encounter (Signed)
CPAP supply order signed by Dr. Kelly and returned to [facility]. 

## 2017-02-08 DIAGNOSIS — H43813 Vitreous degeneration, bilateral: Secondary | ICD-10-CM | POA: Diagnosis not present

## 2017-02-08 DIAGNOSIS — H34232 Retinal artery branch occlusion, left eye: Secondary | ICD-10-CM | POA: Diagnosis not present

## 2017-02-08 DIAGNOSIS — H35031 Hypertensive retinopathy, right eye: Secondary | ICD-10-CM | POA: Diagnosis not present

## 2017-02-08 DIAGNOSIS — H34832 Tributary (branch) retinal vein occlusion, left eye, with macular edema: Secondary | ICD-10-CM | POA: Diagnosis not present

## 2017-02-26 ENCOUNTER — Encounter: Payer: Self-pay | Admitting: Cardiovascular Disease

## 2017-02-26 ENCOUNTER — Ambulatory Visit (INDEPENDENT_AMBULATORY_CARE_PROVIDER_SITE_OTHER): Payer: Medicare Other | Admitting: Cardiovascular Disease

## 2017-02-26 VITALS — BP 128/76 | HR 70 | Ht 75.0 in | Wt 254.0 lb

## 2017-02-26 DIAGNOSIS — Z7901 Long term (current) use of anticoagulants: Secondary | ICD-10-CM

## 2017-02-26 DIAGNOSIS — G4733 Obstructive sleep apnea (adult) (pediatric): Secondary | ICD-10-CM | POA: Diagnosis not present

## 2017-02-26 DIAGNOSIS — E7800 Pure hypercholesterolemia, unspecified: Secondary | ICD-10-CM | POA: Diagnosis not present

## 2017-02-26 DIAGNOSIS — I483 Typical atrial flutter: Secondary | ICD-10-CM

## 2017-02-26 DIAGNOSIS — G4761 Periodic limb movement disorder: Secondary | ICD-10-CM | POA: Diagnosis not present

## 2017-02-26 NOTE — Progress Notes (Signed)
Patient ID: Gary [name], male   DOB: 06/13/1935, 81 y.o.   MRN: [id number]     HPI: Gary [name], is a 81 y.o. male who presents to sleep clinic for one-year follow-up sleep clinic evaluation of his obstructive sleep apnea and CPAP therapy..  Gary [name] is a 81 year old male who is a patient of Dr. Sallyanne Kuster.  He is status post cardioversion for atrial flutter, and has a history of hyperlipidemia and aortic valve sclerosis.  He was referred for a sleep study which was done in May 2016 which revealed moderate obstructive sleep apnea with an AHI of 15.9 overall and 20.5 per hour during REM sleep.  He had reduced sleep efficiency at 54.8%.  Latency to sleep onset was prolonged at 36 minutes.  There was mild snoring.  Oxygen desaturated to 89%.  He underwent a CPAP titration trial and adnexal response to CPAP therapy with the initial recommended CPAP pressure 9 cm water pressure.  Due to oral venting, a nasal pillow mask with chinstrap was recommended.  Since initiating CPAP therapy, he has felt well.  He typically goes to bed at 10:30 and wakes up the proximal he 7:30 AM.  Last year, a download was obtained from 11/22/2014 through 12/21/2014.  Compliance was excellent with 100% days used and 100% of days used greater than 4 hours.  He is averaging 7 hours and 30 minutes of sleep per night.  On his 38 m water pressure, AHI is excellent at 0.8.  There is no significant mask leak and he has a ResMed AirFit P10 medium size mask.  A download was obtained from 11/28/2015 through 12/27/2015.  This confirms 100% compliance both with usage stays and greater than 4 hours of use.  He is averaging 7 hours and 22 minutes of use per night.  He is set at a 9 cm water pressure.  AHI remains excellent at 1.2 per hour.  He does have increased leak with 95th percent average at 30.0.  Despite this mild leak newborn nasal pillows.  His AHI remains excellent and undoubtedly is probably slightly less than its current  reading.  He denied residual snoring.  Sleep is restorative.  He denied daytime sleepiness or bruxism.[name]   He was found to have increased periodic limb movements on his initial sleep study and had increased PLMS index of 19.7, but denies painful restless legs or the "urge to move."  Epworth Sleepiness Scale: Situation   Chance of Dozing/Sleeping (0 = never , 1 = slight chance , 2 = moderate chance , 3 = high chance )   sitting and reading 1   watching TV 1   sitting inactive in a public place 0   being a passenger in a motor vehicle for an hour or more 1   lying down in the afternoon 2   sitting and talking to someone 0   sitting quietly after lunch (no alcohol) 0   while stopped for a few minutes in traffic as the driver 0   Total Score  5   Since I saw him one year ago, he admits to 100% compliance was CPAP therapy.  I obtained a new download in the office today from 01/22/2017 through 02/20/2017.  This continues to show 100% compliance.  He is averaging 6 hours and 26 minutes per night of use.  At a set pressure of 9 cm, H, I remains excellent at 0.7.  He has a Museum/gallery exhibitions officer medium-size with chinstrap mask.  There is minimal leak intermittently. His sleep is restorative.  An Epworth Sleepiness Scale score was recalculated the office today and this endorsed at 4.  He presents for evaluation.   Past Medical History:  Diagnosis Date  . Adenomatous colon polyp   . ALLERGIC RHINITIS 10/03/2007  . ASTHMA, ACUTE 08/25/2009  . Atrial flutter ([place]) 06/26/2014  . CARDIAC MURMUR, AORTIC 10/03/2007  . ERECTILE DYSFUNCTION 10/03/2007  . High blood pressure 03/2016  . HYPERLIPIDEMIA 10/03/2007  . Rosacea     Past Surgical History:  Procedure Laterality Date  . ADENOIDECTOMY    . CARDIOVERSION N/A 07/01/2014   Procedure: CARDIOVERSION;  Surgeon: [name], MD;  Location: [facility];  Service: Cardiovascular;  Laterality: N/A;  . COLONOSCOPY    . TEE WITHOUT CARDIOVERSION N/A 07/01/2014    Procedure: TRANSESOPHAGEAL ECHOCARDIOGRAM (TEE);  Surgeon: [name], MD;  Location: [facility] ENDOSCOPY;  Service: Cardiovascular;  Laterality: N/A;  . TONSILLECTOMY      No Known Allergies  Current Outpatient Prescriptions  Medication Sig Dispense Refill  . co-enzyme Q-10 30 MG capsule Take 30 mg by mouth every other day.     [name] ELIQUIS 5 MG TABS tablet TAKE 1 TABLET TWICE DAILY 180 tablet 1  . fluticasone (FLONASE) 50 MCG/ACT nasal spray Place 1 spray into both nostrils every other day.    . lisinopril (PRINIVIL,ZESTRIL) 5 MG tablet Take 5 mg by mouth daily.    . minocycline (MINOCIN,DYNACIN) 100 MG capsule TAKE 1 CAPSULE EVERY DAY 90 capsule 3  . Multiple Vitamin (MULTIVITAMIN) tablet Take 1 tablet by mouth daily.    . simvastatin (ZOCOR) 20 MG tablet Take 1 tablet (20 mg total) by mouth at bedtime. 90 tablet 4   No current facility-administered medications for this visit.     Social History   Social History  . Marital status: Married    Spouse name: N/A  . Number of children: N/A  . Years of education: N/A   Occupational History  . Not on file.   Social History Main Topics  . Smoking status: Former Research scientist (life sciences)  . Smokeless tobacco: Never Used  . Alcohol use 4.8 oz/week    8 Glasses of wine per week     Comment: ocassionally   . Drug use: No  . Sexual activity: Not on file   Other Topics Concern  . Not on file   Social History Narrative  . No narrative on file    Family History  Problem Relation Age of Onset  . Diabetes Mother   . Cancer Father        liver ca  . Liver cancer Father      ROS General: Negative; No fevers, chills, or night sweats HEENT: Negative; No changes in vision or hearing, sinus congestion, difficulty swallowing Pulmonary: Negative; No cough, wheezing, shortness of breath, hemoptysis Cardiovascular: History of atrial fibrillation GI: Negative; No nausea, vomiting, diarrhea, or abdominal pain GU: Negative; No dysuria, hematuria, or  difficulty voiding Musculoskeletal: Negative; no myalgias, joint pain, or weakness Hematologic: Negative; no easy bruising, bleeding Endocrine: Negative; no heat/cold intolerance Neuro: Negative; no changes in balance, headaches Skin: Negative; No rashes or skin lesions Psychiatric: Negative; No behavioral problems, depression Sleep: See history of present illness   Physical Exam BP 128/76   Pulse 70   Ht 6' 3"  (1.905 m)   Wt 254 lb (115.2 kg)   BMI 31.75 kg/m    Repeat blood pressure by me was 122/78  Wt Readings from Last 3  Encounters:  02/26/17 254 lb (115.2 kg)  12/22/16 251 lb (113.9 kg)  06/14/16 245 lb 8 oz (111.4 kg)   General: Alert, oriented, no distress.  Skin: normal turgor, no rashes, warm and dry HEENT: Normocephalic, atraumatic. Pupils equal round and reactive to light; sclera anicteric; extraocular muscles intact;  Nose without nasal septal hypertrophy Mouth/Parynx benign; status post UPPP surgery 3Neck: No JVD, no carotid bruits; normal carotid upstroke Lungs: clear to ausculatation and percussion; no wheezing or rales Chest wall: without tenderness to palpitation Heart: PMI not displaced, RRR, s1 s2 normal, 2/6 early peaking ejection systolic murmur, no diastolic murmur, no rubs, gallops, thrills, or heaves Abdomen: soft, nontender; no hepatosplenomehaly, BS+; abdominal aorta nontender and not dilated by palpation. Back: no CVA tenderness Pulses 2+ Musculoskeletal: full range of motion, normal strength, no joint deformities Extremities: no clubbing cyanosis or edema, Homan's sign negative  Neurologic: grossly nonfocal; Cranial nerves grossly wnl Psychologic: Normal mood and affect   ECG (independently read by me): Atrial flutter with a ventricular rate at 70 bpm.  Nonspecific ST changes.  QTc interval 4o6 ms.  LABS:  BMP Latest Ref Rng & Units 06/07/2016 04/05/2016 03/14/2016  Glucose 70 - 99 mg/dL 117(H) 103(H) 95  BUN 6 - 23 mg/dL 29(H) 38(H) 26(H)    Creatinine 0.40 - 1.50 mg/dL 1.16 1.31 1.04  Sodium 135 - 145 mEq/L 142 137 142  Potassium 3.5 - 5.1 mEq/L 5.0 5.1 5.2(H)  Chloride 96 - 112 mEq/L 106 101 106  CO2 19 - 32 mEq/L 31 29 29   Calcium 8.4 - 10.5 mg/dL 9.0 8.7 9.3     Hepatic Function Latest Ref Rng & Units 06/07/2016 05/24/2015 06/29/2014  Total Protein 6.0 - 8.3 g/dL 6.6 6.4 6.3  Albumin 3.5 - 5.2 g/dL 3.9 4.0 4.0  AST 0 - 37 U/L 19 21 22   ALT 0 - 53 U/L 24 21 23   Alk Phosphatase 39 - 117 U/L 60 65 60  Total Bilirubin 0.2 - 1.2 mg/dL 0.8 1.0 1.6(H)  Bilirubin, Direct 0.0 - 0.3 mg/dL 0.2 0.2 -     CBC Latest Ref Rng & Units 06/07/2016 03/14/2016 05/24/2015  WBC 4.0 - 10.5 K/uL 5.6 7.4 5.5  Hemoglobin 13.0 - 17.0 g/dL 15.7 16.8 17.3(H)  Hematocrit 39.0 - 52.0 % 46.0 49.9 52.4(H)  Platelets 150.0 - 400.0 K/uL 148.0(L) 153.0 163.0     Lipid Panel     Component Value Date/Time   CHOL 164 06/07/2016 0820   TRIG 101.0 06/07/2016 0820   HDL 38.90 (L) 06/07/2016 0820   CHOLHDL 4 06/07/2016 0820   VLDL 20.2 06/07/2016 0820   LDLCALC 105 (H) 06/07/2016 0820   LDLDIRECT 143.5 01/22/2012 0916     RADIOLOGY: No results found.   IMPRESSION:  1. OSA (obstructive sleep apnea)   2. Typical atrial flutter ([place])   3. Long term current use of anticoagulant   4. Pure hypercholesterolemia   5. Periodic limb movement      ASSESSMENT AND PLAN: Gary [name] is a 81 year old gentleman who is a history of atrial flutter and is status post cardioversion.  However, he has developed recurrent atrial flutter and will last seen by Dr. Sallyanne Kuster was felt to remain asymptomatic, compliant with anticoagulation and his rate is spontaneously well controlled, consistent with significant AV conduction abnormalities.  He is not on any medications that could cause worsening bradycardia.  From a sleep apnea perspective, he continues to do exceptionally well.  He does not have residual daytime sleepiness.  His AHI is excellent at 0.7.  He is 100%  compliance with usage stays.  He is averaging 6 hours and 26 minutes of sleep.  On days used.  He has a nasal pillow mask with chinstrap due to some occasional oral venting.  He is unaware of any breakthrough snoring.  On his diagnostic study.  He did have a slight increase in periodic limb movements during sleep, but he denies any symptoms associated with the urge to move for painful restless legs.  His blood pressure today is stable and on recheck by me was 122/70 on his present dose of lisinopril 5 mg daily.  He continues to be on eliquis for anticoagulation and is without bleeding.  He is on simvastatin for hyperlipidemia.  He denies myalgias.  Per Medicare guidelines, I will see him in one year for reevaluation.    Troy Sine, MD, [facility]  02/28/2017 6:31 PM

## 2017-02-26 NOTE — Patient Instructions (Signed)
Medication Instructions:  Your physician recommends that you continue on your current medications as directed. Please refer to the Current Medication list given to you today.  Follow-Up: Your physician wants you to follow-up in: 1 year with Dr. Kelly (sleep clinic) You will receive a reminder letter in the mail two months in advance. If you don't receive a letter, please call our office to schedule the follow-up appointment.   Any Other Special Instructions Will Be Listed Below (If Applicable).     If you need a refill on your cardiac medications before your next appointment, please call your pharmacy.   

## 2017-03-19 DIAGNOSIS — M25561 Pain in right knee: Secondary | ICD-10-CM | POA: Diagnosis not present

## 2017-03-19 DIAGNOSIS — M1711 Unilateral primary osteoarthritis, right knee: Secondary | ICD-10-CM | POA: Diagnosis not present

## 2017-04-19 DIAGNOSIS — L570 Actinic keratosis: Secondary | ICD-10-CM | POA: Diagnosis not present

## 2017-04-19 DIAGNOSIS — Z85828 Personal history of other malignant neoplasm of skin: Secondary | ICD-10-CM | POA: Diagnosis not present

## 2017-04-19 DIAGNOSIS — L821 Other seborrheic keratosis: Secondary | ICD-10-CM | POA: Diagnosis not present

## 2017-04-19 DIAGNOSIS — L812 Freckles: Secondary | ICD-10-CM | POA: Diagnosis not present

## 2017-04-30 DIAGNOSIS — M1711 Unilateral primary osteoarthritis, right knee: Secondary | ICD-10-CM | POA: Diagnosis not present

## 2017-05-07 DIAGNOSIS — M1711 Unilateral primary osteoarthritis, right knee: Secondary | ICD-10-CM | POA: Diagnosis not present

## 2017-05-07 DIAGNOSIS — M13861 Other specified arthritis, right knee: Secondary | ICD-10-CM | POA: Diagnosis not present

## 2017-05-07 DIAGNOSIS — M25561 Pain in right knee: Secondary | ICD-10-CM | POA: Diagnosis not present

## 2017-05-08 ENCOUNTER — Telehealth: Payer: Self-pay | Admitting: *Deleted

## 2017-05-08 NOTE — Telephone Encounter (Signed)
CPAP supply order signed by Dr. Kelly and returned to [facility]. 

## 2017-05-14 DIAGNOSIS — M1711 Unilateral primary osteoarthritis, right knee: Secondary | ICD-10-CM | POA: Diagnosis not present

## 2017-05-17 DIAGNOSIS — H34832 Tributary (branch) retinal vein occlusion, left eye, with macular edema: Secondary | ICD-10-CM | POA: Diagnosis not present

## 2017-05-17 DIAGNOSIS — H35031 Hypertensive retinopathy, right eye: Secondary | ICD-10-CM | POA: Diagnosis not present

## 2017-05-17 DIAGNOSIS — H43813 Vitreous degeneration, bilateral: Secondary | ICD-10-CM | POA: Diagnosis not present

## 2017-06-07 DIAGNOSIS — M2341 Loose body in knee, right knee: Secondary | ICD-10-CM | POA: Diagnosis not present

## 2017-06-07 DIAGNOSIS — G8929 Other chronic pain: Secondary | ICD-10-CM | POA: Diagnosis not present

## 2017-06-07 DIAGNOSIS — M1711 Unilateral primary osteoarthritis, right knee: Secondary | ICD-10-CM | POA: Diagnosis not present

## 2017-06-21 DIAGNOSIS — G8929 Other chronic pain: Secondary | ICD-10-CM | POA: Diagnosis not present

## 2017-06-21 DIAGNOSIS — M1711 Unilateral primary osteoarthritis, right knee: Secondary | ICD-10-CM | POA: Diagnosis not present

## 2017-06-26 ENCOUNTER — Encounter: Payer: Self-pay | Admitting: Family Medicine

## 2017-06-26 ENCOUNTER — Telehealth: Payer: Self-pay | Admitting: Cardiovascular Disease

## 2017-06-26 ENCOUNTER — Ambulatory Visit (INDEPENDENT_AMBULATORY_CARE_PROVIDER_SITE_OTHER): Payer: Medicare HMO | Admitting: Family Medicine

## 2017-06-26 VITALS — BP 118/74 | HR 82 | Temp 97.7°F | Ht 75.0 in | Wt 242.0 lb

## 2017-06-26 DIAGNOSIS — E7800 Pure hypercholesterolemia, unspecified: Secondary | ICD-10-CM

## 2017-06-26 DIAGNOSIS — I350 Nonrheumatic aortic (valve) stenosis: Secondary | ICD-10-CM | POA: Diagnosis not present

## 2017-06-26 DIAGNOSIS — G4733 Obstructive sleep apnea (adult) (pediatric): Secondary | ICD-10-CM | POA: Diagnosis not present

## 2017-06-26 DIAGNOSIS — R351 Nocturia: Secondary | ICD-10-CM | POA: Diagnosis not present

## 2017-06-26 DIAGNOSIS — I10 Essential (primary) hypertension: Secondary | ICD-10-CM

## 2017-06-26 DIAGNOSIS — Z0000 Encounter for general adult medical examination without abnormal findings: Secondary | ICD-10-CM

## 2017-06-26 DIAGNOSIS — N401 Enlarged prostate with lower urinary tract symptoms: Secondary | ICD-10-CM | POA: Diagnosis not present

## 2017-06-26 DIAGNOSIS — I483 Typical atrial flutter: Secondary | ICD-10-CM | POA: Diagnosis not present

## 2017-06-26 LAB — POCT URINALYSIS DIPSTICK
BILIRUBIN UA: NEGATIVE
Blood, UA: NEGATIVE
GLUCOSE UA: NEGATIVE
KETONES UA: NEGATIVE
Leukocytes, UA: NEGATIVE
Nitrite, UA: NEGATIVE
ODOR: NEGATIVE
PH UA: 5.5 (ref 5.0–8.0)
Protein, UA: NEGATIVE
Spec Grav, UA: 1.025 (ref 1.010–1.025)
Urobilinogen, UA: 0.2 E.U./dL

## 2017-06-26 LAB — LIPID PANEL
CHOLESTEROL: 162 mg/dL (ref 0–200)
HDL: 41.5 mg/dL (ref >39.00)
LDL CALC: 107 mg/dL — AB (ref 0–99)
NonHDL: 120.04
TRIGLYCERIDES: 63 mg/dL (ref 0.0–149.0)
Total CHOL/HDL Ratio: 4
VLDL: 12.6 mg/dL (ref 0.0–40.0)

## 2017-06-26 LAB — BASIC METABOLIC PANEL
BUN: 30 mg/dL — AB (ref 6–23)
CO2: 30 mEq/L (ref 19–32)
Calcium: 9 mg/dL (ref 8.4–10.5)
Chloride: 106 mEq/L (ref 96–112)
Creatinine, Ser: 1.13 mg/dL (ref 0.40–1.50)
GFR: 66.49 mL/min (ref >60.00)
GLUCOSE: 112 mg/dL — AB (ref 70–99)
Potassium: 4.9 mEq/L (ref 3.5–5.1)
SODIUM: 140 meq/L (ref 135–145)

## 2017-06-26 LAB — CBC WITH DIFFERENTIAL/PLATELET
BASOS PCT: 0.4 % (ref 0.0–3.0)
Basophils Absolute: 0 10*3/uL (ref 0.0–0.1)
EOS ABS: 0.1 10*3/uL (ref 0.0–0.7)
EOS PCT: 1.5 % (ref 0.0–5.0)
HEMATOCRIT: 44.5 % (ref 39.0–52.0)
HEMOGLOBIN: 14.9 g/dL (ref 13.0–17.0)
LYMPHS PCT: 15.8 % (ref 12.0–46.0)
Lymphs Abs: 0.8 10*3/uL (ref 0.7–4.0)
MCHC: 33.6 g/dL (ref 30.0–36.0)
MCV: 97.3 fl (ref 78.0–100.0)
Monocytes Absolute: 0.7 10*3/uL (ref 0.1–1.0)
Monocytes Relative: 14.5 % — ABNORMAL HIGH (ref 3.0–12.0)
NEUTROS ABS: 3.5 10*3/uL (ref 1.4–7.7)
Neutrophils Relative %: 67.8 % (ref 43.0–77.0)
PLATELETS: 161 10*3/uL (ref 150.0–400.0)
RBC: 4.57 Mil/uL (ref 4.22–5.81)
RDW: 13.3 % (ref 11.5–15.5)
WBC: 5.1 10*3/uL (ref 4.0–10.5)

## 2017-06-26 LAB — PSA: PSA: 1.07 ng/mL (ref 0.10–4.00)

## 2017-06-26 LAB — HEPATIC FUNCTION PANEL
ALBUMIN: 3.7 g/dL (ref 3.5–5.2)
ALT: 19 U/L (ref 0–53)
AST: 15 U/L (ref 0–37)
Alkaline Phosphatase: 59 U/L (ref 39–117)
Bilirubin, Direct: 0.2 mg/dL (ref 0.0–0.3)
TOTAL PROTEIN: 6.3 g/dL (ref 6.0–8.3)
Total Bilirubin: 0.8 mg/dL (ref 0.2–1.2)

## 2017-06-26 LAB — TSH: TSH: 2.19 u[IU]/mL (ref 0.35–4.50)

## 2017-06-26 MED ORDER — LISINOPRIL 5 MG PO TABS
5.0000 mg | ORAL_TABLET | Freq: Every day | ORAL | 4 refills | Status: DC
Start: 2017-06-26 — End: 2018-07-15

## 2017-06-26 MED ORDER — SIMVASTATIN 20 MG PO TABS
20.0000 mg | ORAL_TABLET | Freq: Every day | ORAL | 4 refills | Status: DC
Start: 2017-06-26 — End: 2019-01-17

## 2017-06-26 MED ORDER — MINOCYCLINE HCL 100 MG PO CAPS: 100.0000 mg | ORAL_CAPSULE | Freq: Every day | ORAL | 3 refills | Status: DC

## 2017-06-26 MED ORDER — FLUTICASONE PROPIONATE 50 MCG/ACT NA SUSP
1.0000 | NASAL | 5 refills | Status: DC
Start: 2017-06-26 — End: 2018-09-11

## 2017-06-26 NOTE — Patient Instructions (Signed)
Labs today.......... I will call you if there is anything abnormal  Continue current medications  Continue grade exercise program  Follow-up in one year sooner if any problems

## 2017-06-26 NOTE — Progress Notes (Signed)
Gary [name] is a 82 year old married male nonsmoker who comes in today for annual physical examination because of a history of hypertension hyperlipidemia atrial flutter and other healthcare issues  His arrhythmia is stable. He sees cardiology every August. He continues taking the blood thinner  For mild hypertension he takes lisinopril 5 mg daily. BP 118/74  For hyperlipidemia takes Zocor 20 mg daily.  He takes doxycycline 100 mg every other day because of a history of rosacea  Last colonoscopy was 2012. He's had polyps in the past. He's curious to know when he should have a follow-up colonoscopy. I will ask him to call GI and find that out  Tetanus booster 2016 at [facility]  He gets routine eye care, dental care, colonoscopy as noted above  Information given on shingles vaccine he also has nonrheumatic aortic valve disease currently asymptomatic  He also has BPH with nocturia 3. We discussed various options. Elects to do nothing at this juncture.  14 point review of systems June otherwise negative  Cognitive function normal he walks daily home health safety reviewed no issues identified, no guns in the house, he does have a healthcare power of attorney and living well  He is due to have a partial knee replacement right knee the spring by Dr. Lorre Nick. He had a  problem with his left knee years ago had an injection of cortisone and that knee has been asymptomatic.  BP 118/74 (BP Location: Left Arm, Patient Position: Sitting, Cuff Size: Normal)   Pulse 82   Temp 97.7 F (36.5 C) (Oral)   Ht 6\' 3"  (1.905 m)   Wt 242 lb (109.8 kg)   BMI 30.25 kg/m  Well-developed well-nourished male no acute distress vital signs stable he is afebrile  Examination HEENT were negative except he's had a uvulectomy.........[name] wears nightly CPAP........ neck was supple no adenopathy no carotid bruits cardiopulmonary exam normal except for murmur of aortic stenosis. Abdominal exam negative genitalia normal circumcised  male rectum normal stool guaiac-negative prostate smooth nonnodular 2+ BPH  Extremities normal skin normal peripheral pulses normal except he has numerous seborrheic keratosis. He says he sees his dermatologist Dr. Ronnald Ramp yearly for skin check. He has a dark lesion in his right side of the penis. This appears to be a seborrheic keratosis.  #1 hypertension at goal........ continue current therapy  #2 hyperlipidemia........ continue Zocor check labs  #3 atrial flutter paroxysmal........ continue current treatment program  #4 BPH with nocturia......... observation  #5 sleep apnea............ continue CPAP  #6 rosacea......... continue doxycycline

## 2017-06-26 NOTE — Telephone Encounter (Signed)
   [facility] Pre-operative Risk Assessment    Request for surgical clearance:  1. What type of surgery is being performed? Right total knee replacement (per C. Unk Lightning, [place] note in Epic from 06/21/17)   2. When is this surgery scheduled? TBD   3. What type of clearance is required (medical clearance vs. Pharmacy clearance to hold med vs. Both)? both  4. Are there any medications that need to be held prior to surgery and how long? none specified - patient is on Eliquis    5. Practice name and name of physician performing surgery? Dr. Vickey Huger @ [facility]   6. What is your office phone and fax number? (p) [phone]   (f) [phone]   7. Anesthesia type (None, local, MAC, general) ? Not specified    Fidel Levy 06/26/2017, 12:06 PM  _________________________________________________________________   (provider comments below)

## 2017-06-27 NOTE — Telephone Encounter (Signed)
Will route to Pharm D for recommendations on Eliquis prior to knee surgery.

## 2017-06-27 NOTE — Telephone Encounter (Signed)
Patient with diagnosis of atrial flutter on Eliquis for anticoagulation.    Procedure: right total knee replacement Date of procedure: TBD  CHADS2-VASc score of  3 (HTN, AGE x 2)  CrCl 82.3 Platelet count 161  Per office protocol, patient can hold Eliquis for 3 days prior to procedure.   Patient will not need bridging with Lovenox (enoxaparin) around procedure.  Patient should restart Eliquis on the evening of procedure or day after, at discretion of procedure MD  For orthopedic procedures please be sure to resume therapeutic (not prophylactic) dosing.

## 2017-06-27 NOTE — Telephone Encounter (Signed)
Left message for patient to call back pre-op pool. I have also routed the pre-op request to Pharm D for their recommendation on Eliquis prior to knee replacement.

## 2017-06-28 NOTE — Telephone Encounter (Signed)
   Primary Cardiologist: [name], MD  Chart reviewed as part of pre-operative protocol coverage. Given past medical history and time since last visit, based on ACC/AHA guidelines, LONN IM would be at acceptable risk for the planned procedure without further cardiovascular testing.   DASI is 7.34Mets.   I will route this recommendation to the requesting party via Epic fax function and remove from pre-op pool.  Please call with questions.  [place], [place] 06/28/2017, 1:52 PM

## 2017-07-02 ENCOUNTER — Other Ambulatory Visit: Payer: Self-pay | Admitting: Orthopedic Surgery

## 2017-07-30 ENCOUNTER — Other Ambulatory Visit: Payer: Self-pay | Admitting: Cardiovascular Disease

## 2017-07-30 NOTE — Telephone Encounter (Signed)
Rx request sent to pharmacy.  

## 2017-08-15 NOTE — Pre-Procedure Instructions (Signed)
Gary [name]  08/15/2017      [facility] [place] - [address] AT [facility] OF [address] [address] Phone: [phone] Fax: [phone]    Your procedure is scheduled on August 27, 2017.  Report to [facility] Admitting at 530 AM.  Call this number if you have problems the morning of surgery:  [phone]   Remember:  Do not eat food or drink liquids after midnight.  Take these medicines the morning of surgery with A SIP OF WATER  flonase nasal spray-if needed Minocycline (minocin)  Begin holding Eliquis as instructed by your physician (3 days prior to procedure).  7 days prior to surgery STOP taking any Aspirin (unless otherwise instructed by your surgeon), Aleve, Naproxen, Ibuprofen, Motrin, Advil, Goody's, BC's, all herbal medications, fish oil, and all vitamins  Continue all other medications as instructed by your physician except follow the above medication instructions before surgery  [facility] is not responsible for any belongings or valuables.  Contacts, dentures or bridgework may not be worn into surgery.  Leave your suitcase in the car.  After surgery it may be brought to your room.  For patients admitted to the hospital, discharge time will be determined by your treatment team.  Patients discharged the day of surgery will not be allowed to drive home.    [facility]- Preparing For Surgery  Before surgery, you can play an important role. Because skin is not sterile, your skin needs to be as free of germs as possible. You can reduce the number of germs on your skin by washing with CHG (chlorahexidine gluconate) Soap before surgery.  CHG is an antiseptic cleaner which kills germs and bonds with the skin to continue killing germs even after washing.  Please do not use if you have an allergy to CHG or antibacterial soaps. If your skin becomes reddened/irritated stop using the CHG.  Do  not shave (including legs and underarms) for at least 48 hours prior to first CHG shower. It is OK to shave your face.  Please follow these instructions carefully.   1. Shower the NIGHT BEFORE SURGERY and the MORNING OF SURGERY with CHG.   2. If you chose to wash your hair, wash your hair first as usual with your normal shampoo.  3. After you shampoo, rinse your hair and body thoroughly to remove the shampoo.  4. Use CHG as you would any other liquid soap. You can apply CHG directly to the skin and wash gently with a scrungie or a clean washcloth.   5. Apply the CHG Soap to your body ONLY FROM THE NECK DOWN.  Do not use on open wounds or open sores. Avoid contact with your eyes, ears, mouth and genitals (private parts). Wash Face and genitals (private parts)  with your normal soap.  6. Wash thoroughly, paying special attention to the area where your surgery will be performed.  7. Thoroughly rinse your body with warm water from the neck down.  8. DO NOT shower/wash with your normal soap after using and rinsing off the CHG Soap.  9. Pat yourself dry with a CLEAN TOWEL.  10. Wear CLEAN PAJAMAS to bed the night before surgery, wear comfortable clothes the morning of surgery  11. Place CLEAN SHEETS on your bed the night of your first shower and DO NOT SLEEP WITH PETS.  Day of Surgery: Do not apply any deodorants/lotions.  Please wear clean clothes to the hospital/surgery center.     Do not wear jewelry.  Do not wear lotions, powders, or colognes, or deodorant.  Men may shave face and neck.  Do not bring valuables to the hospital.  Please read over the following fact sheets that you were given. Pain Booklet, Coughing and Deep Breathing, MRSA Information and Surgical Site Infection Prevention

## 2017-08-15 NOTE — Progress Notes (Addendum)
PCP: Stevie Kern, MD  Cardiologist: [name], MD  EKG: 02/26/17 in EPIC  Stress test: pt denies  ECHO: 06/2014 in EPIC  Cardiac Cath: pt denies  Chest x-ray: denies past year, no recent respiratory infections/complicatons  Pt instructions to hold Eliquis 3 days prior to procedure

## 2017-08-16 ENCOUNTER — Encounter (HOSPITAL_COMMUNITY): Payer: Self-pay

## 2017-08-16 ENCOUNTER — Encounter (HOSPITAL_COMMUNITY)
Admission: RE | Admit: 2017-08-16 | Discharge: 2017-08-16 | Disposition: A | Discharge status: Home / Self Care | Payer: Medicare HMO | Source: Ambulatory Visit | Attending: Orthopedic Surgery | Admitting: Orthopedic Surgery

## 2017-08-16 ENCOUNTER — Other Ambulatory Visit: Payer: Self-pay

## 2017-08-16 DIAGNOSIS — Z01812 Encounter for preprocedural laboratory examination: Secondary | ICD-10-CM | POA: Diagnosis not present

## 2017-08-16 DIAGNOSIS — M1711 Unilateral primary osteoarthritis, right knee: Secondary | ICD-10-CM | POA: Insufficient documentation

## 2017-08-16 HISTORY — DX: Sleep apnea, unspecified: G47.30

## 2017-08-16 HISTORY — DX: Malignant (primary) neoplasm, unspecified: C80.1

## 2017-08-16 LAB — CBC WITH DIFFERENTIAL/PLATELET
BASOS ABS: 0 10*3/uL (ref 0.0–0.1)
BASOS PCT: 0 %
Eosinophils Absolute: 0.1 10*3/uL (ref 0.0–0.7)
Eosinophils Relative: 2 %
HEMATOCRIT: 46.2 % (ref 39.0–52.0)
HEMOGLOBIN: 15.5 g/dL (ref 13.0–17.0)
LYMPHS PCT: 17 %
Lymphs Abs: 1.1 10*3/uL (ref 0.7–4.0)
MCH: 32.7 pg (ref 26.0–34.0)
MCHC: 33.5 g/dL (ref 30.0–36.0)
MCV: 97.5 fL (ref 78.0–100.0)
Monocytes Absolute: 0.9 10*3/uL (ref 0.1–1.0)
Monocytes Relative: 14 %
NEUTROS ABS: 4.5 10*3/uL (ref 1.7–7.7)
NEUTROS PCT: 67 %
Platelets: 135 10*3/uL — ABNORMAL LOW (ref 150–400)
RBC: 4.74 MIL/uL (ref 4.22–5.81)
RDW: 13.4 % (ref 11.5–15.5)
WBC: 6.6 10*3/uL (ref 4.0–10.5)

## 2017-08-16 LAB — COMPREHENSIVE METABOLIC PANEL
ALBUMIN: 3.8 g/dL (ref 3.5–5.0)
ALK PHOS: 67 U/L (ref 38–126)
ALT: 22 U/L (ref 17–63)
AST: 18 U/L (ref 15–41)
Anion gap: 8 (ref 5–15)
BUN: 32 mg/dL — AB (ref 6–20)
CO2: 21 mmol/L — ABNORMAL LOW (ref 22–32)
CREATININE: 1.13 mg/dL (ref 0.61–1.24)
Calcium: 8.8 mg/dL — ABNORMAL LOW (ref 8.9–10.3)
Chloride: 111 mmol/L (ref 101–111)
GFR calc Af Amer: >60 mL/min (ref >60)
GFR calc non Af Amer: 60 mL/min — ABNORMAL LOW (ref >60)
GLUCOSE: 114 mg/dL — AB (ref 65–99)
POTASSIUM: 4.6 mmol/L (ref 3.5–5.1)
Sodium: 140 mmol/L (ref 135–145)
Total Bilirubin: 0.8 mg/dL (ref 0.3–1.2)
Total Protein: 6.8 g/dL (ref 6.5–8.1)

## 2017-08-16 LAB — PROTIME-INR
INR: 1.24
PROTHROMBIN TIME: 15.5 s — AB (ref 11.4–15.2)

## 2017-08-16 LAB — SURGICAL PCR SCREEN
MRSA, PCR: NEGATIVE
Staphylococcus aureus: NEGATIVE

## 2017-08-20 NOTE — Progress Notes (Signed)
Anesthesia Chart Review:   Case:  [id number] Date/Time:  08/27/17 0715   Procedure:  RIGHT TOTAL KNEE ARTHROPLASTY (Right Knee)   Anesthesia type:  Spinal   Pre-op diagnosis:  primary osteoarthritis right knee   Location:  [facility] OR ROOM 07 / [facility]   Surgeon:  Vickey Huger, MD      DISCUSSION: Pt is a 82 year old male with atrial flutter with spontaneous rate control.  Has cardiac clearance for surgery. Will hold elqiuis 3 days before surgery.    VS: BP (!) 156/80   Pulse 65   Temp 36.8 C   Resp 20   Ht 6\' 3"  (1.905 m)   Wt 250 lb 1.6 oz (113.4 kg)   SpO2 97%   BMI 31.26 kg/m   PROVIDERS: Dorena Cookey, MD Cardiologist is [name], MD. Last office visit 12/22/16. Pt cleared for surgery by Leanor Kail, PA on 06/28/17   LABS: Labs reviewed: Acceptable for surgery. (all labs ordered are listed, but only abnormal results are displayed)  Labs Reviewed  PROTIME-INR - Abnormal; Notable for the following components:      Result Value   Prothrombin Time 15.5 (*)    All other components within normal limits  CBC WITH DIFFERENTIAL/PLATELET - Abnormal; Notable for the following components:   Platelets 135 (*)    All other components within normal limits  COMPREHENSIVE METABOLIC PANEL - Abnormal; Notable for the following components:   CO2 21 (*)    Glucose, Bld 114 (*)    BUN 32 (*)    Calcium 8.8 (*)    GFR calc non Af Amer 60 (*)    All other components within normal limits  SURGICAL PCR SCREEN     EKG 02/26/17: Atrial flutter with variable AV block.  Low voltage QRS.  Nonspecific ST abnormality.   CV:  Carotid duplex 07/27/16:  - Heterogeneous plaque, bilaterally. - 1-39% bilateral ICA stenosis. - Normal subclavian arteries, bilaterally. - Patent vertebral arteries with antegrade flow.  TEE 07/01/14:  - Left ventricle: Systolic function was normal. The estimated ejection fraction was in the range of 55% to 60%. Wall motion wasnormal; there were no regional  wall motion abnormalities. - Aortic valve: There was trivial regurgitation. - Mitral valve: There was mild regurgitation. - Left atrium: No evidence of thrombus in the atrial cavity or appendage. The appendage was morphologically a left appendage, multilobulated, and of normal size. Emptying velocity was mildly reduced. - Right atrium: No evidence of thrombus in the atrial cavity or appendage.   Past Medical History:  Diagnosis Date  . Adenomatous colon polyp   . ALLERGIC RHINITIS 10/03/2007  . ASTHMA, ACUTE 08/25/2009  . Atrial flutter ([place]) 06/26/2014  . Cancer ([place])    skin cancers removed in the past basal cell and squamous cell  . CARDIAC MURMUR, AORTIC 10/03/2007  . ERECTILE DYSFUNCTION 10/03/2007  . High blood pressure 03/2016  . HYPERLIPIDEMIA 10/03/2007  . Rosacea   . Sleep apnea     Past Surgical History:  Procedure Laterality Date  . ADENOIDECTOMY    . CARDIOVERSION N/A 07/01/2014   Procedure: CARDIOVERSION;  Surgeon: [name], MD;  Location: [facility] ENDOSCOPY;  Service: Cardiovascular;  Laterality: N/A;  . COLONOSCOPY    . TEE WITHOUT CARDIOVERSION N/A 07/01/2014   Procedure: TRANSESOPHAGEAL ECHOCARDIOGRAM (TEE);  Surgeon: [name], MD;  Location: [facility] ENDOSCOPY;  Service: Cardiovascular;  Laterality: N/A;  . TONSILLECTOMY      MEDICATIONS: . apixaban (ELIQUIS) 5 MG TABS  tablet  . co-enzyme Q-10 30 MG capsule  . fluticasone (FLONASE) 50 MCG/ACT nasal spray  . lisinopril (PRINIVIL,ZESTRIL) 5 MG tablet  . minocycline (MINOCIN,DYNACIN) 100 MG capsule  . Multiple Vitamin (MULTIVITAMIN) tablet  . simvastatin (ZOCOR) 20 MG tablet   No current facility-administered medications for this encounter.    - Last dose eliquis 08/23/17.   If no changes, I anticipate pt can proceed with surgery as scheduled.   Willeen Cass, FNP-BC [facility] Short Stay Surgical Center/Anesthesiology Phone: [phone] 08/20/2017 12:00 PM

## 2017-08-23 DIAGNOSIS — H43813 Vitreous degeneration, bilateral: Secondary | ICD-10-CM | POA: Diagnosis not present

## 2017-08-23 DIAGNOSIS — H35031 Hypertensive retinopathy, right eye: Secondary | ICD-10-CM | POA: Diagnosis not present

## 2017-08-23 DIAGNOSIS — H34832 Tributary (branch) retinal vein occlusion, left eye, with macular edema: Secondary | ICD-10-CM | POA: Diagnosis not present

## 2017-08-24 MED ORDER — SODIUM CHLORIDE 0.9 % IV SOLN
1000.0000 mg | INTRAVENOUS | Status: AC
  Administered 2017-08-27: 1000 mg via INTRAVENOUS
  Filled 2017-08-24: qty 1100

## 2017-08-24 MED ORDER — DEXAMETHASONE SODIUM PHOSPHATE 10 MG/ML IJ SOLN
8.0000 mg | Freq: Once | INTRAMUSCULAR | Status: AC
  Administered 2017-08-27: 8 mg via INTRAVENOUS
  Filled 2017-08-24: qty 1

## 2017-08-24 MED ORDER — BUPIVACAINE LIPOSOME 1.3 % IJ SUSP
20.0000 mL | INTRAMUSCULAR | Status: AC
  Administered 2017-08-27: 20 mL
  Filled 2017-08-24: qty 20

## 2017-08-24 MED ORDER — DEXTROSE 5 % IV SOLN
3.0000 g | INTRAVENOUS | Status: AC
  Administered 2017-08-27: 3 g via INTRAVENOUS
  Filled 2017-08-24: qty 3

## 2017-08-26 NOTE — Anesthesia Preprocedure Evaluation (Addendum)
Anesthesia Evaluation  Patient identified by MRN, date of birth, ID band Patient awake    Reviewed: Allergy & Precautions, NPO status , Patient's Chart, lab work & pertinent test results  Airway Mallampati: III  TM Distance: >3 FB Neck ROM: full    Dental no notable dental hx. (+) Teeth Intact   Pulmonary asthma , former smoker,    Pulmonary exam normal        Cardiovascular hypertension, Pt. on medications Normal cardiovascular exam+ dysrhythmias Atrial Fibrillation + Valvular Problems/Murmurs  Rhythm:Regular Rate:Normal  EKG 02/26/17: Atrial flutter with variable AV block.  Low voltage QRS.  Nonspecific ST abnormality.  Carotid duplex 07/27/16:  - Heterogeneous plaque, bilaterally. - 1-39% bilateral ICA stenosis. - Normal subclavian arteries, bilaterally. - Patent vertebral arteries with antegrade flow.  TEE 07/01/14:  - Left ventricle: Systolic function was normal. The estimated ejection fraction was in the range of 55% to 60%. Wall motion wasnormal; there were no regional wall motion abnormalities. - Aortic valve: There was trivial regurgitation. - Mitral valve: There was mild regurgitation. - Left atrium: No evidence of thrombus in the atrial cavity or appendage. The appendage was morphologically a left appendage, multilobulated, and of normal size. Emptying velocity was mildly reduced. - Right atrium: No evidence of thrombus in the atrial cavity or appendage.     Neuro/Psych negative neurological ROS  negative psych ROS   GI/Hepatic negative GI ROS, Neg liver ROS,   Endo/Other  negative endocrine ROSobese  Renal/GU negative Renal ROS     Musculoskeletal negative musculoskeletal ROS (+)   Abdominal   Peds negative pediatric ROS (+)  Hematology negative hematology ROS (+)   Anesthesia Other Findings   Reproductive/Obstetrics negative OB ROS                            Anesthesia  Physical  Anesthesia Plan  ASA: III  Anesthesia Plan: MAC and Spinal   Post-op Pain Management:  Regional for Post-op pain   Induction: Intravenous  PONV Risk Score and Plan: 1 and Treatment may vary due to age or medical condition and Propofol infusion  Airway Management Planned: Nasal Cannula  Additional Equipment:   Intra-op Plan:   Post-operative Plan:   Informed Consent: I have reviewed the patients History and Physical, chart, labs and discussed the procedure including the risks, benefits and alternatives for the proposed anesthesia with the patient or authorized representative who has indicated his/her understanding and acceptance.     Plan Discussed with: CRNA, Anesthesiologist and Surgeon  Anesthesia Plan Comments: (  )      Anesthesia Quick Evaluation

## 2017-08-26 NOTE — Anesthesia Preprocedure Evaluation (Deleted)
Anesthesia Evaluation  Patient identified by MRN, date of birth, ID band Patient awake    Reviewed: Allergy & Precautions, NPO status , Patient's Chart, lab work & pertinent test results  Airway Mallampati: II   Neck ROM: full    Dental no notable dental hx.    Pulmonary asthma , sleep apnea , former smoker,    Pulmonary exam normal        Cardiovascular hypertension, Pt. on medications Normal cardiovascular exam+ dysrhythmias Atrial Fibrillation + Valvular Problems/Murmurs AI  Rhythm:Irregular Rate:Normal  EKG 02/26/17: Atrial flutter with variable AV block.  Low voltage QRS.  Nonspecific ST abnormality.  Carotid duplex 07/27/16:  - Heterogeneous plaque, bilaterally. - 1-39% bilateral ICA stenosis. - Normal subclavian arteries, bilaterally. - Patent vertebral arteries with antegrade flow.  TEE 07/01/14:  - Left ventricle: Systolic function was normal. The estimated ejection fraction was in the range of 55% to 60%. Wall motion wasnormal; there were no regional wall motion abnormalities. - Aortic valve: There was trivial regurgitation. - Mitral valve: There was mild regurgitation. - Left atrium: No evidence of thrombus in the atrial cavity or appendage. The appendage was morphologically a left appendage, multilobulated, and of normal size. Emptying velocity was mildly reduced. - Right atrium: No evidence of thrombus in the atrial cavity or appendage.     Neuro/Psych negative neurological ROS  negative psych ROS   GI/Hepatic negative GI ROS, Neg liver ROS,   Endo/Other  negative endocrine ROSobese  Renal/GU negative Renal ROS  negative genitourinary   Musculoskeletal  (+) Arthritis , Osteoarthritis,    Abdominal Normal abdominal exam  (+)   Peds negative pediatric ROS (+)  Hematology negative hematology ROS (+)   Anesthesia Other Findings   Reproductive/Obstetrics negative OB ROS                              Anesthesia Physical  Anesthesia Plan  ASA: III  Anesthesia Plan: MAC and Spinal   Post-op Pain Management:  Regional for Post-op pain   Induction: Intravenous  PONV Risk Score and Plan: 1 and Treatment may vary due to age or medical condition  Airway Management Planned: Nasal Cannula and Natural Airway  Additional Equipment:   Intra-op Plan:   Post-operative Plan:   Informed Consent: I have reviewed the patients History and Physical, chart, labs and discussed the procedure including the risks, benefits and alternatives for the proposed anesthesia with the patient or authorized representative who has indicated his/her understanding and acceptance.     Plan Discussed with: CRNA, Anesthesiologist and Surgeon  Anesthesia Plan Comments: (  )                                         Anesthesia Evaluation  Patient identified by MRN, date of birth, ID band Patient awake    Reviewed: Allergy & Precautions, NPO status , Patient's Chart, lab work & pertinent test results  Airway Mallampati: II   Neck ROM: full    Dental   Pulmonary asthma , former smoker,          Cardiovascular + dysrhythmias Atrial Fibrillation + Valvular Problems/Murmurs AI  Trivial AI on TTE.   Neuro/Psych    GI/Hepatic   Endo/Other  obese  Renal/GU      Musculoskeletal   Abdominal   Peds  Hematology   Anesthesia  Other Findings   Reproductive/Obstetrics                             Anesthesia Physical Anesthesia Plan  ASA: II  Anesthesia Plan: MAC   Post-op Pain Management:    Induction: Intravenous  Airway Management Planned: Nasal Cannula  Additional Equipment:   Intra-op Plan:   Post-operative Plan:   Informed Consent: I have reviewed the patients History and Physical, chart, labs and discussed the procedure including the risks, benefits and alternatives for the proposed  anesthesia with the patient or authorized representative who has indicated his/her understanding and acceptance.     Plan Discussed with: CRNA, Anesthesiologist and Surgeon  Anesthesia Plan Comments:         Anesthesia Quick Evaluation  Anesthesia Quick Evaluation                                  Anesthesia Evaluation  Patient identified by MRN, date of birth, ID band Patient awake    Reviewed: Allergy & Precautions, NPO status , Patient's Chart, lab work & pertinent test results  Airway Mallampati: II   Neck ROM: full    Dental   Pulmonary asthma , former smoker,          Cardiovascular + dysrhythmias Atrial Fibrillation + Valvular Problems/Murmurs AI  Trivial AI on TTE.   Neuro/Psych    GI/Hepatic   Endo/Other  obese  Renal/GU      Musculoskeletal   Abdominal   Peds  Hematology   Anesthesia Other Findings   Reproductive/Obstetrics                             Anesthesia Physical Anesthesia Plan  ASA: II  Anesthesia Plan: MAC   Post-op Pain Management:    Induction: Intravenous  Airway Management Planned: Nasal Cannula  Additional Equipment:   Intra-op Plan:   Post-operative Plan:   Informed Consent: I have reviewed the patients History and Physical, chart, labs and discussed the procedure including the risks, benefits and alternatives for the proposed anesthesia with the patient or authorized representative who has indicated his/her understanding and acceptance.     Plan Discussed with: CRNA, Anesthesiologist and Surgeon  Anesthesia Plan Comments:         Anesthesia Quick Evaluation

## 2017-08-27 ENCOUNTER — Other Ambulatory Visit: Payer: Self-pay

## 2017-08-27 ENCOUNTER — Encounter (HOSPITAL_COMMUNITY)
Admission: RE | Disposition: A | Discharge status: Home / Self Care | Payer: Self-pay | Source: Ambulatory Visit | Attending: Orthopedic Surgery

## 2017-08-27 ENCOUNTER — Ambulatory Visit (HOSPITAL_COMMUNITY): Payer: Medicare HMO | Admitting: Vascular Surgery

## 2017-08-27 ENCOUNTER — Observation Stay (HOSPITAL_COMMUNITY)
Admission: RE | Admit: 2017-08-27 | Discharge: 2017-08-28 | Disposition: A | Discharge status: Home / Self Care | Payer: Medicare HMO | Source: Ambulatory Visit | Attending: Orthopedic Surgery | Admitting: Orthopedic Surgery

## 2017-08-27 ENCOUNTER — Encounter (HOSPITAL_COMMUNITY): Payer: Self-pay | Admitting: General Practice

## 2017-08-27 ENCOUNTER — Ambulatory Visit (HOSPITAL_COMMUNITY): Payer: Medicare HMO | Admitting: Anesthesiology

## 2017-08-27 DIAGNOSIS — M1711 Unilateral primary osteoarthritis, right knee: Secondary | ICD-10-CM | POA: Diagnosis not present

## 2017-08-27 DIAGNOSIS — Z85828 Personal history of other malignant neoplasm of skin: Secondary | ICD-10-CM | POA: Diagnosis not present

## 2017-08-27 DIAGNOSIS — G4730 Sleep apnea, unspecified: Secondary | ICD-10-CM | POA: Insufficient documentation

## 2017-08-27 DIAGNOSIS — Z7902 Long term (current) use of antithrombotics/antiplatelets: Secondary | ICD-10-CM | POA: Insufficient documentation

## 2017-08-27 DIAGNOSIS — L719 Rosacea, unspecified: Secondary | ICD-10-CM | POA: Insufficient documentation

## 2017-08-27 DIAGNOSIS — I10 Essential (primary) hypertension: Secondary | ICD-10-CM | POA: Diagnosis not present

## 2017-08-27 DIAGNOSIS — E785 Hyperlipidemia, unspecified: Secondary | ICD-10-CM | POA: Insufficient documentation

## 2017-08-27 DIAGNOSIS — Z96659 Presence of unspecified artificial knee joint: Secondary | ICD-10-CM

## 2017-08-27 DIAGNOSIS — G8918 Other acute postprocedural pain: Secondary | ICD-10-CM | POA: Diagnosis not present

## 2017-08-27 DIAGNOSIS — Z79899 Other long term (current) drug therapy: Secondary | ICD-10-CM | POA: Insufficient documentation

## 2017-08-27 DIAGNOSIS — I4892 Unspecified atrial flutter: Secondary | ICD-10-CM | POA: Insufficient documentation

## 2017-08-27 DIAGNOSIS — Z87891 Personal history of nicotine dependence: Secondary | ICD-10-CM | POA: Diagnosis not present

## 2017-08-27 HISTORY — DX: Unspecified osteoarthritis, unspecified site: M19.90

## 2017-08-27 HISTORY — PX: TOTAL KNEE ARTHROPLASTY: SHX125

## 2017-08-27 SURGERY — ARTHROPLASTY, KNEE, TOTAL
Anesthesia: Monitor Anesthesia Care | Site: Knee | Laterality: Right

## 2017-08-27 MED ORDER — PANTOPRAZOLE SODIUM 40 MG PO TBEC
40.0000 mg | DELAYED_RELEASE_TABLET | Freq: Every day | ORAL | Status: DC
  Administered 2017-08-27 – 2017-08-28 (×2): 40 mg via ORAL
  Filled 2017-08-27 (×2): qty 1

## 2017-08-27 MED ORDER — METHOCARBAMOL 1000 MG/10ML IJ SOLN
500.0000 mg | Freq: Four times a day (QID) | INTRAVENOUS | Status: DC | PRN
  Filled 2017-08-27: qty 5

## 2017-08-27 MED ORDER — ONDANSETRON HCL 4 MG/2ML IJ SOLN
INTRAMUSCULAR | Status: AC
Start: 2017-08-27 — End: 2017-08-27
  Filled 2017-08-27: qty 2

## 2017-08-27 MED ORDER — METOCLOPRAMIDE HCL 5 MG PO TABS: 5.0000 mg | ORAL_TABLET | Freq: Three times a day (TID) | ORAL | Status: DC | PRN

## 2017-08-27 MED ORDER — CHLORHEXIDINE GLUCONATE 4 % EX LIQD: 60.0000 mL | Freq: Once | CUTANEOUS | Status: DC

## 2017-08-27 MED ORDER — LISINOPRIL 5 MG PO TABS
5.0000 mg | ORAL_TABLET | Freq: Every day | ORAL | Status: DC
  Administered 2017-08-27 – 2017-08-28 (×2): 5 mg via ORAL
  Filled 2017-08-27 (×2): qty 1

## 2017-08-27 MED ORDER — PROPOFOL 10 MG/ML IV BOLUS
INTRAVENOUS | Status: AC
  Filled 2017-08-27: qty 20

## 2017-08-27 MED ORDER — ONDANSETRON HCL 4 MG/2ML IJ SOLN: 4.0000 mg | Freq: Four times a day (QID) | INTRAMUSCULAR | Status: DC | PRN

## 2017-08-27 MED ORDER — SIMVASTATIN 20 MG PO TABS
20.0000 mg | ORAL_TABLET | ORAL | Status: DC
  Administered 2017-08-27: 20 mg via ORAL
  Filled 2017-08-27: qty 1

## 2017-08-27 MED ORDER — OXYCODONE HCL 5 MG PO TABS
5.0000 mg | ORAL_TABLET | ORAL | Status: DC | PRN
  Administered 2017-08-27 – 2017-08-28 (×6): 10 mg via ORAL
  Filled 2017-08-27 (×6): qty 2

## 2017-08-27 MED ORDER — BUPIVACAINE IN DEXTROSE 0.75-8.25 % IT SOLN
INTRATHECAL | Status: DC | PRN
  Administered 2017-08-27: 2 mL via INTRATHECAL

## 2017-08-27 MED ORDER — ACETAMINOPHEN 500 MG PO TABS
1000.0000 mg | ORAL_TABLET | Freq: Four times a day (QID) | ORAL | Status: AC
  Administered 2017-08-27 – 2017-08-28 (×4): 1000 mg via ORAL
  Filled 2017-08-27 (×4): qty 2

## 2017-08-27 MED ORDER — SODIUM CHLORIDE 0.9 % IJ SOLN
INTRAMUSCULAR | Status: DC | PRN
  Administered 2017-08-27: 20 mL via INTRAVENOUS

## 2017-08-27 MED ORDER — ZOLPIDEM TARTRATE 5 MG PO TABS
5.0000 mg | ORAL_TABLET | Freq: Every evening | ORAL | Status: DC | PRN
  Filled 2017-08-27: qty 1

## 2017-08-27 MED ORDER — FLEET ENEMA 7-19 GM/118ML RE ENEM: 1.0000 | ENEMA | Freq: Once | RECTAL | Status: DC | PRN

## 2017-08-27 MED ORDER — GABAPENTIN 300 MG PO CAPS
300.0000 mg | ORAL_CAPSULE | Freq: Once | ORAL | Status: AC
  Administered 2017-08-27: 300 mg via ORAL
  Filled 2017-08-27: qty 1

## 2017-08-27 MED ORDER — PROPOFOL 10 MG/ML IV BOLUS
INTRAVENOUS | Status: DC | PRN
  Administered 2017-08-27: 30 mg via INTRAVENOUS
  Administered 2017-08-27: 20 mg via INTRAVENOUS

## 2017-08-27 MED ORDER — PHENOL 1.4 % MT LIQD: 1.0000 | OROMUCOSAL | Status: DC | PRN

## 2017-08-27 MED ORDER — ACETAMINOPHEN 500 MG PO TABS
1000.0000 mg | ORAL_TABLET | Freq: Once | ORAL | Status: AC
  Administered 2017-08-27: 1000 mg via ORAL
  Filled 2017-08-27: qty 2

## 2017-08-27 MED ORDER — METOCLOPRAMIDE HCL 5 MG/ML IJ SOLN: 5.0000 mg | Freq: Three times a day (TID) | INTRAMUSCULAR | Status: DC | PRN

## 2017-08-27 MED ORDER — PHENYLEPHRINE HCL 10 MG/ML IJ SOLN
INTRAVENOUS | Status: DC | PRN
  Administered 2017-08-27: 15 ug/min via INTRAVENOUS

## 2017-08-27 MED ORDER — METHOCARBAMOL 500 MG PO TABS
500.0000 mg | ORAL_TABLET | Freq: Four times a day (QID) | ORAL | Status: DC | PRN
  Administered 2017-08-27 (×2): 500 mg via ORAL
  Filled 2017-08-27 (×2): qty 1

## 2017-08-27 MED ORDER — MENTHOL 3 MG MT LOZG: 1.0000 | LOZENGE | OROMUCOSAL | Status: DC | PRN

## 2017-08-27 MED ORDER — BUPIVACAINE-EPINEPHRINE (PF) 0.25% -1:200000 IJ SOLN
INTRAMUSCULAR | Status: DC | PRN
  Administered 2017-08-27: 30 mL via PERINEURAL

## 2017-08-27 MED ORDER — GABAPENTIN 300 MG PO CAPS
300.0000 mg | ORAL_CAPSULE | Freq: Three times a day (TID) | ORAL | Status: DC
  Administered 2017-08-27 – 2017-08-28 (×4): 300 mg via ORAL
  Filled 2017-08-27 (×4): qty 1

## 2017-08-27 MED ORDER — LACTATED RINGERS IV SOLN
INTRAVENOUS | Status: DC | PRN
Start: 2017-08-27 — End: 2017-08-27
  Administered 2017-08-27 (×2): via INTRAVENOUS

## 2017-08-27 MED ORDER — FENTANYL CITRATE (PF) 250 MCG/5ML IJ SOLN
INTRAMUSCULAR | Status: AC
  Filled 2017-08-27: qty 5

## 2017-08-27 MED ORDER — FENTANYL CITRATE (PF) 100 MCG/2ML IJ SOLN
25.0000 ug | INTRAMUSCULAR | Status: DC | PRN
Start: 2017-08-27 — End: 2017-08-27

## 2017-08-27 MED ORDER — DIPHENHYDRAMINE HCL 12.5 MG/5ML PO ELIX: 12.5000 mg | ORAL_SOLUTION | ORAL | Status: DC | PRN

## 2017-08-27 MED ORDER — ROPIVACAINE HCL 7.5 MG/ML IJ SOLN
INTRAMUSCULAR | Status: DC | PRN
  Administered 2017-08-27: 25 mL via PERINEURAL

## 2017-08-27 MED ORDER — CEFAZOLIN SODIUM-DEXTROSE 2-4 GM/100ML-% IV SOLN
2.0000 g | Freq: Four times a day (QID) | INTRAVENOUS | Status: AC
  Administered 2017-08-27 (×2): 2 g via INTRAVENOUS
  Filled 2017-08-27 (×2): qty 100

## 2017-08-27 MED ORDER — SODIUM CHLORIDE 0.9 % IR SOLN
Status: DC | PRN
  Administered 2017-08-27: 3000 mL

## 2017-08-27 MED ORDER — DOCUSATE SODIUM 100 MG PO CAPS
100.0000 mg | ORAL_CAPSULE | Freq: Two times a day (BID) | ORAL | Status: DC
  Administered 2017-08-27 – 2017-08-28 (×3): 100 mg via ORAL
  Filled 2017-08-27 (×3): qty 1

## 2017-08-27 MED ORDER — BUPIVACAINE-EPINEPHRINE (PF) 0.25% -1:200000 IJ SOLN
INTRAMUSCULAR | Status: AC
  Filled 2017-08-27: qty 30

## 2017-08-27 MED ORDER — FENTANYL CITRATE (PF) 250 MCG/5ML IJ SOLN
INTRAMUSCULAR | Status: DC | PRN
  Administered 2017-08-27 (×2): 50 ug via INTRAVENOUS

## 2017-08-27 MED ORDER — ONDANSETRON HCL 4 MG PO TABS: 4.0000 mg | ORAL_TABLET | Freq: Four times a day (QID) | ORAL | Status: DC | PRN

## 2017-08-27 MED ORDER — BISACODYL 5 MG PO TBEC: 5.0000 mg | DELAYED_RELEASE_TABLET | Freq: Every day | ORAL | Status: DC | PRN

## 2017-08-27 MED ORDER — MEPERIDINE HCL 50 MG/ML IJ SOLN: 6.2500 mg | INTRAMUSCULAR | Status: DC | PRN

## 2017-08-27 MED ORDER — HYDROMORPHONE HCL 1 MG/ML IJ SOLN: 0.5000 mg | INTRAMUSCULAR | Status: DC | PRN

## 2017-08-27 MED ORDER — DEXAMETHASONE SODIUM PHOSPHATE 10 MG/ML IJ SOLN
10.0000 mg | Freq: Once | INTRAMUSCULAR | Status: AC
  Administered 2017-08-28: 10 mg via INTRAVENOUS
  Filled 2017-08-27: qty 1

## 2017-08-27 MED ORDER — HYDROMORPHONE HCL 2 MG/ML IJ SOLN: 0.5000 mg | INTRAMUSCULAR | Status: DC | PRN

## 2017-08-27 MED ORDER — PROPOFOL 500 MG/50ML IV EMUL
INTRAVENOUS | Status: DC | PRN
  Administered 2017-08-27: 75 ug/kg/min via INTRAVENOUS

## 2017-08-27 MED ORDER — APIXABAN 5 MG PO TABS
5.0000 mg | ORAL_TABLET | Freq: Two times a day (BID) | ORAL | Status: DC
  Administered 2017-08-28: 5 mg via ORAL
  Filled 2017-08-27: qty 1

## 2017-08-27 MED ORDER — TRANEXAMIC ACID 1000 MG/10ML IV SOLN
1000.0000 mg | Freq: Once | INTRAVENOUS | Status: AC
  Administered 2017-08-27: 1000 mg via INTRAVENOUS
  Filled 2017-08-27: qty 10

## 2017-08-27 MED ORDER — ALUM & MAG HYDROXIDE-SIMETH 200-200-20 MG/5ML PO SUSP: 30.0000 mL | ORAL | Status: DC | PRN

## 2017-08-27 MED ORDER — MIDAZOLAM HCL 2 MG/2ML IJ SOLN
INTRAMUSCULAR | Status: AC
  Filled 2017-08-27: qty 2

## 2017-08-27 MED ORDER — ONDANSETRON HCL 4 MG/2ML IJ SOLN
INTRAMUSCULAR | Status: DC | PRN
  Administered 2017-08-27: 4 mg via INTRAVENOUS

## 2017-08-27 MED ORDER — SENNOSIDES-DOCUSATE SODIUM 8.6-50 MG PO TABS: 1.0000 | ORAL_TABLET | Freq: Every evening | ORAL | Status: DC | PRN

## 2017-08-27 SURGICAL SUPPLY — 68 items
BANDAGE ACE 6X5 VEL STRL LF (GAUZE/BANDAGES/DRESSINGS) ×3 IMPLANT
BANDAGE ESMARK 6X9 LF (GAUZE/BANDAGES/DRESSINGS) ×1 IMPLANT
BLADE SAGITTAL 13X1.27X60 (BLADE) ×2 IMPLANT
BLADE SAGITTAL 13X1.27X60MM (BLADE) ×1
BLADE SAW SGTL 83.5X18.5 (BLADE) ×3 IMPLANT
BLADE SURG 10 STRL SS (BLADE) ×3 IMPLANT
BNDG CMPR 9X6 STRL LF SNTH (GAUZE/BANDAGES/DRESSINGS) ×1
BNDG ESMARK 6X9 LF (GAUZE/BANDAGES/DRESSINGS) ×3
BOWL SMART MIX CTS (DISPOSABLE) ×3 IMPLANT
BSPLAT TIB 5D G CMNT STM RT (Knees) ×1 IMPLANT
CEMENT BONE SIMPLEX SPEEDSET (Cement) ×6 IMPLANT
CLOSURE WOUND 1/2 X4 (GAUZE/BANDAGES/DRESSINGS) ×1
COVER SURGICAL LIGHT HANDLE (MISCELLANEOUS) ×3 IMPLANT
CUFF TOURNIQUET SINGLE 34IN LL (TOURNIQUET CUFF) ×3 IMPLANT
DRAPE EXTREMITY T 121X128X90 (DRAPE) ×3 IMPLANT
DRAPE HALF SHEET 40X57 (DRAPES) ×3 IMPLANT
DRAPE INCISE IOBAN 66X45 STRL (DRAPES) ×6 IMPLANT
DRAPE U-SHAPE 47X51 STRL (DRAPES) ×3 IMPLANT
DRSG AQUACEL AG ADV 3.5X10 (GAUZE/BANDAGES/DRESSINGS) ×3 IMPLANT
DURAPREP 26ML APPLICATOR (WOUND CARE) ×6 IMPLANT
ELECT REM PT RETURN 9FT ADLT (ELECTROSURGICAL) ×3
ELECTRODE REM PT RTRN 9FT ADLT (ELECTROSURGICAL) ×1 IMPLANT
FEMUR  CMT CCR STD SZ11 R KNEE (Knees) ×2 IMPLANT
FEMUR CMT CCR STD SZ11 R KNEE (Knees) ×1 IMPLANT
FEMUR CMTD CCR STD SZ11 R KNEE (Knees) IMPLANT
GLOVE BIOGEL M 7.0 STRL (GLOVE) IMPLANT
GLOVE BIOGEL PI IND STRL 7.5 (GLOVE) IMPLANT
GLOVE BIOGEL PI IND STRL 8.5 (GLOVE) ×1 IMPLANT
GLOVE BIOGEL PI INDICATOR 7.5 (GLOVE)
GLOVE BIOGEL PI INDICATOR 8.5 (GLOVE) ×2
GLOVE SURG ORTHO 8.0 STRL STRW (GLOVE) ×6 IMPLANT
GOWN STRL REUS W/ TWL LRG LVL3 (GOWN DISPOSABLE) ×1 IMPLANT
GOWN STRL REUS W/ TWL XL LVL3 (GOWN DISPOSABLE) ×2 IMPLANT
GOWN STRL REUS W/TWL 2XL LVL3 (GOWN DISPOSABLE) IMPLANT
GOWN STRL REUS W/TWL LRG LVL3 (GOWN DISPOSABLE) ×3
GOWN STRL REUS W/TWL XL LVL3 (GOWN DISPOSABLE) ×6
HANDPIECE INTERPULSE COAX TIP (DISPOSABLE) ×3
HOOD PEEL AWAY FACE SHEILD DIS (HOOD) ×9 IMPLANT
INSERT TIBIAL PLY R GH10-11X10 (Insert) ×3 IMPLANT
KIT BASIN OR (CUSTOM PROCEDURE TRAY) ×3 IMPLANT
KIT TURNOVER KIT B (KITS) ×3 IMPLANT
MANIFOLD NEPTUNE II (INSTRUMENTS) ×3 IMPLANT
NDL 18GX1X1/2 (RX/OR ONLY) (NEEDLE) IMPLANT
NEEDLE 18GX1X1/2 (RX/OR ONLY) (NEEDLE) IMPLANT
NEEDLE 22X1 1/2 (OR ONLY) (NEEDLE) ×6 IMPLANT
NS IRRIG 1000ML POUR BTL (IV SOLUTION) ×3 IMPLANT
PACK TOTAL JOINT (CUSTOM PROCEDURE TRAY) ×3 IMPLANT
PAD ARMBOARD 7.5X6 YLW CONV (MISCELLANEOUS) ×2 IMPLANT
SET HNDPC FAN SPRY TIP SCT (DISPOSABLE) ×1 IMPLANT
STEM POLY PAT PLY 38M KNEE (Knees) ×3 IMPLANT
STEM TIBIA 5 DEG SZ G R KNEE (Knees) ×1 IMPLANT
STRIP CLOSURE SKIN 1/2X4 (GAUZE/BANDAGES/DRESSINGS) ×2 IMPLANT
SUCTION FRAZIER HANDLE 10FR (MISCELLANEOUS)
SUCTION TUBE FRAZIER 10FR DISP (MISCELLANEOUS) IMPLANT
SUT BONE WAX W31G (SUTURE) ×3 IMPLANT
SUT MNCRL AB 3-0 PS2 18 (SUTURE) ×3 IMPLANT
SUT VIC AB 0 CTB1 27 (SUTURE) ×3 IMPLANT
SUT VIC AB 1 CT1 27 (SUTURE) ×6
SUT VIC AB 1 CT1 27XBRD ANBCTR (SUTURE) ×2 IMPLANT
SUT VIC AB 2-0 CT1 27 (SUTURE) ×6
SUT VIC AB 2-0 CT1 TAPERPNT 27 (SUTURE) ×2 IMPLANT
SUT VLOC 180 0 24IN GS25 (SUTURE) ×3 IMPLANT
SYR 20CC LL (SYRINGE) ×6 IMPLANT
TIBIA STEM 5 DEG SZ G R KNEE (Knees) ×3 IMPLANT
TOWEL OR 17X24 6PK STRL BLUE (TOWEL DISPOSABLE) ×3 IMPLANT
TOWEL OR 17X26 10 PK STRL BLUE (TOWEL DISPOSABLE) IMPLANT
TRAY CATH 16FR W/PLASTIC CATH (SET/KITS/TRAYS/PACK) ×2 IMPLANT
WRAP KNEE MAXI GEL POST OP (GAUZE/BANDAGES/DRESSINGS) ×3 IMPLANT

## 2017-08-27 NOTE — Evaluation (Signed)
Physical Therapy Evaluation Patient Details Name: Gary [name] MRN: [id number] DOB: 10-18-1935 Today's Date: 08/27/2017   History of Present Illness  Pt is a 82 y/o male s/p elective R TKA. PMH includes HTN, skin cancer, and a flutter.   Clinical Impression  Pt s/p surgery above with deficits below. Pt tolerated gait training well requiring min guard to min A with RW. Educated about supine HEP and knee precautions. Will continue to follow acutely to maximize funcitonal mobility independence and safety.     Follow Up Recommendations Follow surgeon's recommendation for DC plan and follow-up therapies;Supervision for mobility/OOB    Equipment Recommendations  None recommended by PT    Recommendations for Other Services OT consult     Precautions / Restrictions Precautions Precautions: Knee Precaution Booklet Issued: Yes (comment) Precaution Comments: REviewed knee precautions with pt.  Restrictions Weight Bearing Restrictions: Yes RLE Weight Bearing: Weight bearing as tolerated      Mobility  Bed Mobility Overal bed mobility: Needs Assistance Bed Mobility: Supine to Sit     Supine to sit: Supervision     General bed mobility comments: Supervision for safety.   Transfers Overall transfer level: Needs assistance Equipment used: Rolling walker (2 wheeled) Transfers: Sit to/from Stand Sit to Stand: Min assist         General transfer comment: Min A for steadying assist. Verbal cues for safe hand placement.   Ambulation/Gait Ambulation/Gait assistance: Min guard;Min assist Ambulation Distance (Feet): 75 Feet Assistive device: Rolling walker (2 wheeled) Gait Pattern/deviations: Step-to pattern;Decreased step length - right;Decreased step length - left;Decreased weight shift to right;Antalgic Gait velocity: Decreased Gait velocity interpretation: <1.8 ft/sec, indicate of risk for recurrent falls General Gait Details: Slow, antalgic gait. Mild unsteadiness noted  requiring min guard to occasional min A. Verbal cues for proximity to device and sequencing using RW.   Stairs            Wheelchair Mobility    Modified Rankin (Stroke Patients Only)       Balance Overall balance assessment: Needs assistance Sitting-balance support: No upper extremity supported;Feet supported Sitting balance-Leahy Scale: Good     Standing balance support: Bilateral upper extremity supported;During functional activity Standing balance-Leahy Scale: Poor Standing balance comment: Reliant on BUE support.                              Pertinent Vitals/Pain Pain Assessment: 0-10 Pain Score: 4  Pain Location: R knee  Pain Descriptors / Indicators: Aching;Operative site guarding Pain Intervention(s): Limited activity within patient's tolerance;Monitored during session;Repositioned    Home Living Family/patient expects to be discharged to:: Private residence Living Arrangements: Spouse/significant other Available Help at Discharge: Family;Available 24 hours/day Type of Home: House Home Access: Stairs to enter Entrance Stairs-Rails: None Entrance Stairs-Number of Steps: 2 Home Layout: Two level;Able to live on main level with bedroom/bathroom Home Equipment: Gilford Rile - 2 wheels;Bedside commode      Prior Function Level of Independence: Independent               Hand Dominance        Extremity/Trunk Assessment   Upper Extremity Assessment Upper Extremity Assessment: Defer to OT evaluation    Lower Extremity Assessment Lower Extremity Assessment: RLE deficits/detail RLE Deficits / Details: Reports slight decrease in sensation. Able to perform ther ex below. Deficits consistent with post op pain and weakness.     Cervical / Trunk Assessment Cervical / Trunk Assessment: Normal  Communication   Communication: No difficulties  Cognition Arousal/Alertness: Awake/alert Behavior During Therapy: WFL for tasks  assessed/performed Overall Cognitive Status: Within Functional Limits for tasks assessed                                        General Comments General comments (skin integrity, edema, etc.): Pt's wife and friends present during session.     Exercises Total Joint Exercises Ankle Circles/Pumps: AROM;Both;20 reps Quad Sets: AROM;Right;10 reps Heel Slides: AROM;Right;10 reps   Assessment/Plan    PT Assessment Patient needs continued PT services  PT Problem List Decreased strength;Decreased balance;Decreased mobility;Decreased knowledge of use of DME;Decreased range of motion;Decreased knowledge of precautions;Pain;Impaired sensation       PT Treatment Interventions DME instruction;Gait training;Stair training;Functional mobility training;Therapeutic activities;Balance training;Therapeutic exercise;Patient/family education    PT Goals (Current goals can be found in the Care Plan section)  Acute Rehab PT Goals Patient Stated Goal: to go home  PT Goal Formulation: With patient Time For Goal Achievement: 09/10/17 Potential to Achieve Goals: Good    Frequency 7X/week   Barriers to discharge        Co-evaluation               AM-PAC PT "6 Clicks" Daily Activity  Outcome Measure Difficulty turning over in bed (including adjusting bedclothes, sheets and blankets)?: A Little Difficulty moving from lying on back to sitting on the side of the bed? : A Little Difficulty sitting down on and standing up from a chair with arms (e.g., wheelchair, bedside commode, etc,.)?: Unable Help needed moving to and from a bed to chair (including a wheelchair)?: A Little Help needed walking in hospital room?: A Little Help needed climbing 3-5 steps with a railing? : A Little 6 Click Score: 16    End of Session Equipment Utilized During Treatment: Gait belt Activity Tolerance: Patient tolerated treatment well Patient left: in chair;with call bell/phone within reach;with  family/visitor present Nurse Communication: Mobility status PT Visit Diagnosis: Other abnormalities of gait and mobility (R26.89);Pain Pain - Right/Left: Right Pain - part of body: Knee    Time: 4503-8882 PT Time Calculation (min) (ACUTE ONLY): 24 min   Charges:   PT Evaluation $PT Eval Low Complexity: 1 Low PT Treatments $Gait Training: 8-22 mins   PT G Codes:        Leighton Ruff, PT, DPT  Acute Rehabilitation Services  Pager: [phone]   Rudean Hitt 08/27/2017, 1:46 PM

## 2017-08-27 NOTE — Progress Notes (Signed)
Orthopedic Tech Progress Note Patient Details:  Gary [name] [birth year] [id number]  CPM Right Knee CPM Right Knee: On Right Knee Flexion (Degrees): 90 Right Knee Extension (Degrees): 0 Additional Comments: foot roll  Post Interventions Patient Tolerated: Well Instructions Provided: Care of device  [name] 08/27/2017, 9:59 AM

## 2017-08-27 NOTE — Anesthesia Procedure Notes (Deleted)
Spinal

## 2017-08-27 NOTE — Anesthesia Procedure Notes (Signed)
Anesthesia Regional Block: Adductor canal block   Pre-Anesthetic Checklist: ,, timeout performed, Correct Patient, Correct Site, Correct Laterality, Correct Procedure, Correct Position, site marked, Risks and benefits discussed,  Surgical consent,  Pre-op evaluation,  At surgeon's request and post-op pain management  Laterality: Right  Prep: chloraprep       Needles:  Injection technique: Single-shot  Needle Type: Echogenic Stimulator Needle     Needle Length: 5cm  Needle Gauge: 22     Additional Needles:   Procedures:, nerve stimulator,,, ultrasound used (permanent image in chart),,,,   Nerve Stimulator or Paresthesia:  Response: quadraceps contraction, 0.45 mA,   Additional Responses:   Narrative:  Start time: 08/27/2017 7:00 AM End time: 08/27/2017 7:15 AM Injection made incrementally with aspirations every 5 mL.  Performed by: Personally  Anesthesiologist: Janeece Riggers, MD  Additional Notes: Functioning IV was confirmed and monitors were applied.  A 50mm 22ga Arrow echogenic stimulator needle was used. Sterile prep and drape,hand hygiene and sterile gloves were used. Ultrasound guidance: relevant anatomy identified, needle position confirmed, local anesthetic spread visualized around nerve(s)., vascular puncture avoided.  Image printed for medical record. Negative aspiration and negative test dose prior to incremental administration of local anesthetic. The patient tolerated the procedure well.

## 2017-08-27 NOTE — H&P (Signed)
Gary [name] MRN:  [id number] DOB/SEX:  02-06-1936/male  CHIEF COMPLAINT:  Painful right Knee  HISTORY: Patient is a 82 y.o. male presented with a history of pain in the right knee. Onset of symptoms was gradual starting a few years ago with gradually worsening course since that time. Patient has been treated conservatively with over-the-counter NSAIDs and activity modification. Patient currently rates pain in the knee at 10 out of 10 with activity. There is pain at night.  PAST MEDICAL HISTORY: Patient Active Problem List   Diagnosis Date Noted  . Essential hypertension 04/05/2016  . Routine general medical examination at a health care facility 05/31/2015  . Obstructive sleep apnea 01/01/2015  . Periodic limb movement 01/01/2015  . Atrial flutter, unspecified   . Typical atrial flutter ([place])   . Atrial flutter ([place]) 06/26/2014  . Cardiac arrhythmia 05/14/2014  . BPH associated with nocturia 05/12/2014  . Calculus of parotid gland 09/02/2013  . Murmur, heart 01/22/2012  . Aortic stenosis 12/20/2010  . LIBIDO, DECREASED 10/19/2009  . Hyperlipidemia 10/03/2007  . ERECTILE DYSFUNCTION 10/03/2007  . ALLERGIC RHINITIS 10/03/2007   Past Medical History:  Diagnosis Date  . Adenomatous colon polyp   . ALLERGIC RHINITIS 10/03/2007  . ASTHMA, ACUTE 08/25/2009  . Atrial flutter ([place]) 06/26/2014  . Cancer ([place])    skin cancers removed in the past basal cell and squamous cell  . CARDIAC MURMUR, AORTIC 10/03/2007  . ERECTILE DYSFUNCTION 10/03/2007  . High blood pressure 03/2016  . HYPERLIPIDEMIA 10/03/2007  . Rosacea   . Sleep apnea    Past Surgical History:  Procedure Laterality Date  . ADENOIDECTOMY    . CARDIOVERSION N/A 07/01/2014   Procedure: CARDIOVERSION;  Surgeon: [name], MD;  Location: [facility] ENDOSCOPY;  Service: Cardiovascular;  Laterality: N/A;  . COLONOSCOPY    . TEE WITHOUT CARDIOVERSION N/A 07/01/2014   Procedure: TRANSESOPHAGEAL ECHOCARDIOGRAM (TEE);  Surgeon: [name], MD;  Location: [facility] ENDOSCOPY;  Service: Cardiovascular;  Laterality: N/A;  . TONSILLECTOMY       MEDICATIONS:   Medications Prior to Admission  Medication Sig Dispense Refill Last Dose  . apixaban (ELIQUIS) 5 MG TABS tablet Take 1 tablet (5 mg total) by mouth 2 (two) times daily. Please schedule appointment for refills. 180 tablet 0   . co-enzyme Q-10 30 MG capsule Take 30 mg by mouth every other day.    Taking  . fluticasone (FLONASE) 50 MCG/ACT nasal spray Place 1 spray into both nostrils every other day. 32 g 5   . lisinopril (PRINIVIL,ZESTRIL) 5 MG tablet Take 1 tablet (5 mg total) by mouth daily. 100 tablet 4   . minocycline (MINOCIN,DYNACIN) 100 MG capsule Take 1 capsule (100 mg total) by mouth daily. (Patient taking differently: Take 100 mg by mouth every other day. ) 90 capsule 3   . Multiple Vitamin (MULTIVITAMIN) tablet Take 1 tablet by mouth daily.   Taking  . simvastatin (ZOCOR) 20 MG tablet Take 1 tablet (20 mg total) by mouth at bedtime. (Patient taking differently: Take 20 mg by mouth every other day. ) 90 tablet 4     ALLERGIES:  No Known Allergies  REVIEW OF SYSTEMS:  A comprehensive review of systems was negative except for: Musculoskeletal: positive for arthralgias and bone pain   FAMILY HISTORY:   Family History  Problem Relation Age of Onset  . Diabetes Mother   . Cancer Father        liver ca  . Liver cancer Father  SOCIAL HISTORY:   Social History   Tobacco Use  . Smoking status: Former Research scientist (life sciences)  . Smokeless tobacco: Never Used  Substance Use Topics  . Alcohol use: Yes    Alcohol/week: 4.8 oz    Types: 8 Glasses of wine per week    Comment: ocassionally      EXAMINATION:  Vital signs in last 24 hours:    There were no vitals taken for this visit.  General Appearance:    Alert, cooperative, no distress, appears stated age  Head:    Normocephalic, without obvious abnormality, atraumatic  Eyes:    PERRL, conjunctiva/corneas clear,  EOM's intact, fundi    benign, both eyes       Ears:    Normal TM's and external ear canals, both ears  Nose:   Nares normal, septum midline, mucosa normal, no drainage    or sinus tenderness  Throat:   Lips, mucosa, and tongue normal; teeth and gums normal  Neck:   Supple, symmetrical, trachea midline, no adenopathy;       thyroid:  No enlargement/tenderness/nodules; no carotid   bruit or JVD  Back:     Symmetric, no curvature, ROM normal, no CVA tenderness  Lungs:     Clear to auscultation bilaterally, respirations unlabored  Chest wall:    No tenderness or deformity  Heart:    Regular rate and rhythm, S1 and S2 normal, no murmur, rub   or gallop  Abdomen:     Soft, non-tender, bowel sounds active all four quadrants,    no masses, no organomegaly  Genitalia:    Normal male without lesion, discharge or tenderness  Rectal:    Normal tone, normal prostate, no masses or tenderness;   guaiac negative stool  Extremities:   Extremities normal, atraumatic, no cyanosis or edema  Pulses:   2+ and symmetric all extremities  Skin:   Skin color, texture, turgor normal, no rashes or lesions  Lymph nodes:   Cervical, supraclavicular, and axillary nodes normal  Neurologic:   CNII-XII intact. Normal strength, sensation and reflexes      throughout    Musculoskeletal:  ROM 0-120, Ligaments intact,  Imaging Review Plain radiographs demonstrate severe degenerative joint disease of the right knee. The overall alignment is neutral. The bone quality appears to be good for age and reported activity level.  Assessment/Plan: Primary osteoarthritis, right knee   The patient history, physical examination and imaging studies are consistent with advanced degenerative joint disease of the right knee. The patient has failed conservative treatment.  The clearance notes were reviewed.  After discussion with the patient it was felt that Total Knee Replacement was indicated. The procedure,  risks, and benefits of  total knee arthroplasty were presented and reviewed. The risks including but not limited to aseptic loosening, infection, blood clots, vascular injury, stiffness, patella tracking problems complications among others were discussed. The patient acknowledged the explanation, agreed to proceed with the plan.  Donia Ast 08/27/2017, 6:17 AM

## 2017-08-27 NOTE — Anesthesia Postprocedure Evaluation (Signed)
Anesthesia Post Note  Patient: Gary [name]  Procedure(s) Performed: RIGHT TOTAL KNEE ARTHROPLASTY (Right Knee)     Patient location during evaluation: PACU Anesthesia Type: MAC Level of consciousness: awake and alert Pain management: pain level controlled Vital Signs Assessment: post-procedure vital signs reviewed and stable Respiratory status: spontaneous breathing, nonlabored ventilation, respiratory function stable and patient connected to nasal cannula oxygen Cardiovascular status: stable and blood pressure returned to baseline Postop Assessment: no apparent nausea or vomiting Anesthetic complications: no    Last Vitals:  Vitals:   08/27/17 1059 08/27/17 1124  BP: 133/79 (!) 148/103  Pulse: (!) 51 (!) 53  Resp: 13   Temp: 36.5 C 36.4 C  SpO2: 99% 100%    Last Pain:  Vitals:   08/27/17 1124  TempSrc: Oral  PainSc:     LLE Motor Response: (P) Purposeful movement;Responds to commands (08/27/17 1130) LLE Sensation: (P) Decreased(d/t spinal and regional block) (08/27/17 1130) RLE Motor Response: (P) Purposeful movement;Responds to commands (08/27/17 1130) RLE Sensation: (P) Decreased(d/t regional block and spinal) (08/27/17 1130) L Sensory Level: (P) S1-Sole of foot, small toes (08/27/17 1130) R Sensory Level: (P) S1-Sole of foot, small toes (08/27/17 1130)  Nevaeh Korte

## 2017-08-27 NOTE — Anesthesia Procedure Notes (Signed)
Spinal  Patient location during procedure: OR Start time: 08/27/2017 7:42 AM End time: 08/27/2017 7:44 AM Reason for block: at surgeon's request Staffing Anesthesiologist: Janeece Riggers, MD Resident/CRNA: Freddie Breech, CRNA Performed: resident/CRNA  Preanesthetic Checklist Completed: patient identified, site marked, surgical consent, pre-op evaluation, timeout performed, IV checked, risks and benefits discussed and monitors and equipment checked Spinal Block Patient position: sitting Prep: DuraPrep Patient monitoring: heart rate, continuous pulse ox and blood pressure Approach: right paramedian Location: L4-5 Injection technique: single-shot Needle Needle type: Pencan  Needle gauge: 24 G Needle length: 9 cm Assessment Sensory level: T6 Additional Notes Expiration date of kit checked and confirmed. Patient tolerated procedure well, without complications. X 1 attempt with noted clear CSF return. Loss of motor and sensory on exam post injection.

## 2017-08-27 NOTE — Plan of Care (Signed)
  Problem: Nutrition: Goal: Adequate nutrition will be maintained Outcome: Progressing   Problem: Elimination: Goal: Will not experience complications related to bowel motility Outcome: Progressing   Problem: Pain Managment: Goal: General experience of comfort will improve Outcome: Progressing   Problem: Safety: Goal: Ability to remain free from injury will improve Outcome: Progressing   

## 2017-08-27 NOTE — Discharge Instructions (Signed)

## 2017-08-27 NOTE — Transfer of Care (Signed)
Immediate Anesthesia Transfer of Care Note  Patient: Gary [name]  Procedure(s) Performed: RIGHT TOTAL KNEE ARTHROPLASTY (Right Knee)  Patient Location: PACU  Anesthesia Type:Spinal and MAC combined with regional for post-op pain  Level of Consciousness: drowsy and patient cooperative  Airway & Oxygen Therapy: Patient Spontanous Breathing and Patient connected to face mask oxygen  Post-op Assessment: Report given to RN and Post -op Vital signs reviewed and stable  Post vital signs: Reviewed and stable  Last Vitals:  Vitals Value Taken Time  BP 132/75 08/27/2017  9:18 AM  Temp    Pulse 64 08/27/2017  9:20 AM  Resp 15 08/27/2017  9:20 AM  SpO2 100 % 08/27/2017  9:20 AM  Vitals shown include unvalidated device data.  Last Pain:  Vitals:   08/27/17 0644  TempSrc:   PainSc: 0-No pain         Complications: No apparent anesthesia complications

## 2017-08-28 ENCOUNTER — Encounter (HOSPITAL_COMMUNITY): Payer: Self-pay | Admitting: Orthopedic Surgery

## 2017-08-28 DIAGNOSIS — L719 Rosacea, unspecified: Secondary | ICD-10-CM | POA: Diagnosis not present

## 2017-08-28 DIAGNOSIS — M1711 Unilateral primary osteoarthritis, right knee: Secondary | ICD-10-CM | POA: Diagnosis not present

## 2017-08-28 DIAGNOSIS — Z7902 Long term (current) use of antithrombotics/antiplatelets: Secondary | ICD-10-CM | POA: Diagnosis not present

## 2017-08-28 DIAGNOSIS — Z87891 Personal history of nicotine dependence: Secondary | ICD-10-CM | POA: Diagnosis not present

## 2017-08-28 DIAGNOSIS — Z85828 Personal history of other malignant neoplasm of skin: Secondary | ICD-10-CM | POA: Diagnosis not present

## 2017-08-28 DIAGNOSIS — Z79899 Other long term (current) drug therapy: Secondary | ICD-10-CM | POA: Diagnosis not present

## 2017-08-28 DIAGNOSIS — E785 Hyperlipidemia, unspecified: Secondary | ICD-10-CM | POA: Diagnosis not present

## 2017-08-28 DIAGNOSIS — G4730 Sleep apnea, unspecified: Secondary | ICD-10-CM | POA: Diagnosis not present

## 2017-08-28 DIAGNOSIS — Z96651 Presence of right artificial knee joint: Secondary | ICD-10-CM | POA: Diagnosis not present

## 2017-08-28 DIAGNOSIS — I4892 Unspecified atrial flutter: Secondary | ICD-10-CM | POA: Diagnosis not present

## 2017-08-28 MED ORDER — METHOCARBAMOL 500 MG PO TABS: 500.0000 mg | ORAL_TABLET | Freq: Four times a day (QID) | ORAL | 0 refills | Status: DC | PRN

## 2017-08-28 MED ORDER — OXYCODONE HCL 10 MG PO TABS: 10.0000 mg | ORAL_TABLET | ORAL | 0 refills | Status: DC | PRN

## 2017-08-28 NOTE — Progress Notes (Signed)
Patient states he has 3/1, CPM, and RW at home.

## 2017-08-28 NOTE — Progress Notes (Signed)
Pt d/c home per MD order, pt tol well, pt d/c instructions given, all questions answered, familyu at [facility]

## 2017-08-28 NOTE — Progress Notes (Signed)
SPORTS MEDICINE AND JOINT REPLACEMENT  Lara Mulch, MD    Carlyon Shadow, PA-C [address], [place], [place]  [id number]                             [phone]   PROGRESS NOTE  Subjective:  negative for Chest Pain  negative for Shortness of Breath  negative for Nausea/Vomiting   negative for Calf Pain  negative for Bowel Movement   Tolerating Diet: yes         Patient reports pain as 3 on 0-10 scale.    Objective: Vital signs in last 24 hours:    Patient Vitals for the past 24 hrs:  BP Temp Temp src Pulse Resp SpO2  08/28/17 0634 132/81 (!) 97.5 F (36.4 C) Oral 66 17 100 %  08/28/17 0055 - - - 62 16 98 %  08/28/17 0050 137/88 (!) 97.5 F (36.4 C) Oral (!) 57 16 99 %  08/27/17 2108 (!) 155/91 97.9 F (36.6 C) Oral 74 - 97 %  08/27/17 1124 (!) 148/103 97.6 F (36.4 C) Oral (!) 53 - 100 %  08/27/17 1059 133/79 97.7 F (36.5 C) - (!) 51 13 99 %  08/27/17 1048 (!) 135/94 - - (!) 50 13 100 %  08/27/17 1033 123/70 - - (!) 53 17 99 %  08/27/17 1018 125/70 - - (!) 50 13 99 %  08/27/17 1003 103/87 - - (!) 50 12 99 %  08/27/17 1000 - - - - 14 -  08/27/17 0948 129/79 - - 63 13 98 %  08/27/17 0933 125/88 - - 64 16 100 %  08/27/17 0918 132/75 (!) 97 F (36.1 C) - 75 12 99 %    @flow {1959:LAST@   Intake/Output from previous day:   04/29 0701 - 04/30 0700 In: 1425 [I.V.:1200] Out: 2195 [Urine:2175]   Intake/Output this shift:   No intake/output data recorded.   Intake/Output      04/29 0701 - 04/30 0700 04/30 0701 - 05/01 0700   I.V. (mL/kg) 1200 (10.6)    Other 125    IV Piggyback 100    Total Intake(mL/kg) 1425 (12.6)    Urine (mL/kg/hr) 2175 (0.8)    Blood 20    Total Output 2195    Net -770            LABORATORY DATA: No results for input(s): WBC, HGB, HCT, PLT in the last 168 hours. No results for input(s): NA, K, CL, CO2, BUN, CREATININE, GLUCOSE, CALCIUM in the last 168 hours. Lab Results  Component Value Date   INR 1.24 08/16/2017   INR  1.30 06/29/2014    Examination:  General appearance: alert, cooperative and no distress Extremities: extremities normal, atraumatic, no cyanosis or edema  Wound Exam: clean, dry, intact   Drainage:  None: wound tissue dry  Motor Exam: Quadriceps and Hamstrings Intact  Sensory Exam: Superficial Peroneal, Deep Peroneal and Tibial normal   Assessment:    1 Day Post-Op  Procedure(s) (LRB): RIGHT TOTAL KNEE ARTHROPLASTY (Right)  ADDITIONAL DIAGNOSIS:  Active Problems:   S/P total knee replacement     Plan: Physical Therapy as ordered Weight Bearing as Tolerated (WBAT)  DVT Prophylaxis:  eliquis  DISCHARGE PLAN: Home  DISCHARGE NEEDS: HHPT   Patient doing very well. Expected D/C home today   Patient's anticipated LOS is less than 2 midnights, meeting these requirements: - Younger than 51 - Lives within  1 hour of care - Has a competent adult at home to recover with post-op recover - NO history of  - Chronic pain requiring opiods  - Coronary Artery Disease  - Heart failure  - Heart attack  - Stroke  - DVT/VTE  - Respiratory Failure/COPD  - Renal failure  - Anemia  - Advanced Liver disease              Donia Ast 08/28/2017, 7:04 AM

## 2017-08-28 NOTE — Discharge Summary (Signed)
SPORTS MEDICINE & JOINT REPLACEMENT   Gary Mulch, Gary [name]   Gary Shadow, Gary [name] [place], [place], [place]  [address]                             [id number]  PATIENT ID: Gary [name]        MRN:  [id number]          DOB/AGE: 07/20/1935 / 82 y.o.    DISCHARGE SUMMARY  ADMISSION DATE:    08/27/2017 DISCHARGE DATE:   08/28/2017   ADMISSION DIAGNOSIS: primary osteoarthritis right knee    DISCHARGE DIAGNOSIS:  primary osteoarthritis right knee    ADDITIONAL DIAGNOSIS: Active Problems:   S/P total knee replacement  Past Medical History:  Diagnosis Date  . Adenomatous colon polyp   . ALLERGIC RHINITIS 10/03/2007  . Arthritis   . Atrial flutter ([place]) 06/26/2014  . 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 CARDIAC MURMUR, AORTIC 10/03/2007  . ERECTILE DYSFUNCTION 10/03/2007  . High blood pressure 03/2016  . HYPERLIPIDEMIA 10/03/2007  . Rosacea   . Sleep apnea     PROCEDURE: Procedure(s): RIGHT TOTAL KNEE ARTHROPLASTY on 08/27/2017  CONSULTS:    HISTORY:  See H&P in chart  HOSPITAL COURSE:  Gary [name] is a 82 y.o. admitted on 08/27/2017 and found to have a diagnosis of primary osteoarthritis right knee.  After appropriate laboratory studies were obtained  they were taken to the operating room on 08/27/2017 and underwent Procedure(s): RIGHT TOTAL KNEE ARTHROPLASTY.   They were given perioperative antibiotics:  Anti-infectives (From admission, onward)   Start     Dose/Rate Route Frequency Ordered Stop   08/27/17 1400  ceFAZolin (ANCEF) IVPB 2g/100 mL premix     2 g 200 mL/hr over 30 Minutes Intravenous Every 6 hours 08/27/17 1115 08/27/17 2024   08/27/17 0600  ceFAZolin (ANCEF) 3 g in dextrose 5 % 50 mL IVPB     3 g 130 mL/hr over 30 Minutes Intravenous To ShortStay Surgical 08/24/17 1317 08/27/17 0752    .  Patient given tranexamic acid IV or topical and exparel intra-operatively.  Tolerated the procedure well.    POD#  1: Vital signs were stable.  Patient denied Chest pain, shortness of breath, or calf pain.  Patient was started on Lovenox 30 mg subcutaneously twice daily at 8am.  Consults to PT, OT, and care management were made.  The patient was weight bearing as tolerated.  CPM was placed on the operative leg 0-90 degrees for 6-8 hours a day. When out of the CPM, patient was placed in the foam block to achieve full extension. Incentive spirometry was taught.  Dressing was changed.       POD #2, Continued  PT for ambulation and exercise program.  IV saline locked.  O2 discontinued.    The remainder of the hospital course was dedicated to ambulation and strengthening.   The patient was discharged on 1 Day Post-Op in  Good condition.  Blood products given:none  DIAGNOSTIC STUDIES: Recent vital signs:  Patient Vitals for the past 24 hrs:  BP Temp Temp src Pulse Resp SpO2  08/28/17 0634 132/81 (!) 97.5 F (36.4 C) Oral 66 17 100 %  08/28/17 0055 - - - 62 16 98 %  08/28/17 0050 137/88 (!) 97.5 F (36.4 C) Oral (!) 57 16 99 %  08/27/17 2108 (!) 155/91  97.9 F (36.6 C) Oral 74 - 97 %       Recent laboratory studies: No results for input(s): WBC, HGB, HCT, PLT in the last 168 hours. No results for input(s): NA, K, CL, CO2, BUN, CREATININE, GLUCOSE, CALCIUM in the last 168 hours. Lab Results  Component Value Date   INR 1.24 08/16/2017   INR 1.30 06/29/2014     Recent Radiographic Studies :  No results found.  DISCHARGE INSTRUCTIONS: Discharge Instructions    CPM   Complete by:  As directed    Continuous passive motion machine (CPM):      Use the CPM from 0 to 90 for 4-6 hours per day.      You may increase by 10 per day.  You may break it up into 2 or 3 sessions per day.      Use CPM for 2 weeks or until you are told to stop.   Call Gary [name] / Call 911   Complete by:  As directed    If you experience chest pain or shortness of breath, CALL 911 and be transported to the hospital emergency room.  If  you develope a fever above 101 F, pus (white drainage) or increased drainage or redness at the wound, or calf pain, call your surgeon's office.   Constipation Prevention   Complete by:  As directed    Drink plenty of fluids.  Prune juice may be helpful.  You may use a stool softener, such as Colace (over the counter) 100 mg twice a day.  Use MiraLax (over the counter) for constipation as needed.   Diet - low sodium heart healthy   Complete by:  As directed    Discharge instructions   Complete by:  As directed    INSTRUCTIONS AFTER JOINT REPLACEMENT   Remove items at home which could result in a fall. This includes throw rugs or furniture in walking pathways ICE to the affected joint every three hours while awake for 30 minutes at a time, for at least the first 3-5 days, and then as needed for pain and swelling.  Continue to use ice for pain and swelling. You may notice swelling that will progress down to the foot and ankle.  This is normal after surgery.  Elevate your leg when you are not up walking on it.   Continue to use the breathing machine you got in the hospital (incentive spirometer) which will help keep your temperature down.  It is common for your temperature to cycle up and down following surgery, especially at night when you are not up moving around and exerting yourself.  The breathing machine keeps your lungs expanded and your temperature down.   DIET:  As you were doing prior to hospitalization, we recommend a well-balanced diet.  DRESSING / WOUND CARE / SHOWERING  Keep the surgical dressing until follow up.  The dressing is water proof, so you can shower without any extra covering.  IF THE DRESSING FALLS OFF or the wound gets wet inside, change the dressing with sterile gauze.  Please use good hand washing techniques before changing the dressing.  Do not use any lotions or creams on the incision until instructed by your surgeon.    ACTIVITY  Increase activity slowly as  tolerated, but follow the weight bearing instructions below.   No driving for 6 weeks or until further direction given by your physician.  You cannot drive while taking narcotics.  No lifting or carrying greater than 10  lbs. until further directed by your surgeon. Avoid periods of inactivity such as sitting longer than an hour when not asleep. This helps prevent blood clots.  You may return to work once you are authorized by your doctor.     WEIGHT BEARING   Weight bearing as tolerated with assist device (walker, cane, etc) as directed, use it as long as suggested by your surgeon or therapist, typically at least 4-6 weeks.   EXERCISES  Results after joint replacement surgery are often greatly improved when you follow the exercise, range of motion and muscle strengthening exercises prescribed by your doctor. Safety measures are also important to protect the joint from further injury. Any time any of these exercises cause you to have increased pain or swelling, decrease what you are doing until you are comfortable again and then slowly increase them. If you have problems or questions, call your caregiver or physical therapist for advice.   Rehabilitation is important following a joint replacement. After just a few days of immobilization, the muscles of the leg can become weakened and shrink (atrophy).  These exercises are designed to build up the tone and strength of the thigh and leg muscles and to improve motion. Often times heat used for twenty to thirty minutes before working out will loosen up your tissues and help with improving the range of motion but do not use heat for the first two weeks following surgery (sometimes heat can increase post-operative swelling).   These exercises can be done on a training (exercise) mat, on the floor, on a table or on a bed. Use whatever works the best and is most comfortable for you.    Use music or television while you are exercising so that the exercises  are a pleasant break in your day. This will make your life better with the exercises acting as a break in your routine that you can look forward to.   Perform all exercises about fifteen times, three times per day or as directed.  You should exercise both the operative leg and the other leg as well.   Exercises include:   Quad Sets - Tighten up the muscle on the front of the thigh (Quad) and hold for 5-10 seconds.   Straight Leg Raises - With your knee straight (if you were given a brace, keep it on), lift the leg to 60 degrees, hold for 3 seconds, and slowly lower the leg.  Perform this exercise against resistance later as your leg gets stronger.  Leg Slides: Lying on your back, slowly slide your foot toward your buttocks, bending your knee up off the floor (only go as far as is comfortable). Then slowly slide your foot back down until your leg is flat on the floor again.  Angel Wings: Lying on your back spread your legs to the side as far apart as you can without causing discomfort.  Hamstring Strength:  Lying on your back, push your heel against the floor with your leg straight by tightening up the muscles of your buttocks.  Repeat, but this time bend your knee to a comfortable angle, and push your heel against the floor.  You may put a pillow under the heel to make it more comfortable if necessary.   A rehabilitation program following joint replacement surgery can speed recovery and prevent re-injury in the future due to weakened muscles. Contact your doctor or a physical therapist for more information on knee rehabilitation.    CONSTIPATION  Constipation is defined medically  as fewer than three stools per week and severe constipation as less than one stool per week.  Even if you have a regular bowel pattern at home, your normal regimen is likely to be disrupted due to multiple reasons following surgery.  Combination of anesthesia, postoperative narcotics, change in appetite and fluid intake all  can affect your bowels.   YOU MUST use at least one of the following options; they are listed in order of increasing strength to get the job done.  They are all available over the counter, and you may need to use some, POSSIBLY even all of these options:    Drink plenty of fluids (prune juice may be helpful) and high fiber foods Colace 100 mg by mouth twice a day  Senokot for constipation as directed and as needed Dulcolax (bisacodyl), take with full glass of water  Miralax (polyethylene glycol) once or twice a day as needed.  If you have tried all these things and are unable to have a bowel movement in the first 3-4 days after surgery call either your surgeon or your primary doctor.    If you experience loose stools or diarrhea, hold the medications until you stool forms back up.  If your symptoms do not get better within 1 week or if they get worse, check with your doctor.  If you experience "the worst abdominal pain ever" or develop nausea or vomiting, please contact the office immediately for further recommendations for treatment.   ITCHING:  If you experience itching with your medications, try taking only a single pain pill, or even half a pain pill at a time.  You can also use Benadryl over the counter for itching or also to help with sleep.   TED HOSE STOCKINGS:  Use stockings on both legs until for at least 2 weeks or as directed by physician office. They may be removed at night for sleeping.  MEDICATIONS:  See your medication summary on the "After Visit Summary" that nursing will review with you.  You may have some home medications which will be placed on hold until you complete the course of blood thinner medication.  It is important for you to complete the blood thinner medication as prescribed.  PRECAUTIONS:  If you experience chest pain or shortness of breath - call 911 immediately for transfer to the hospital emergency department.   If you develop a fever greater that 101 F,  purulent drainage from wound, increased redness or drainage from wound, foul odor from the wound/dressing, or calf pain - CONTACT YOUR SURGEON.                                                   FOLLOW-UP APPOINTMENTS:  If you do not already have a post-op appointment, please call the office for an appointment to be seen by your surgeon.  Guidelines for how soon to be seen are listed in your "After Visit Summary", but are typically between 1-4 weeks after surgery.  OTHER INSTRUCTIONS:   Knee Replacement:  Do not place pillow under knee, focus on keeping the knee straight while resting. CPM instructions: 0-90 degrees, 2 hours in the morning, 2 hours in the afternoon, and 2 hours in the evening. Place foam block, curve side up under heel at all times except when in CPM or when walking.  DO NOT modify,  tear, cut, or change the foam block in any way.  MAKE SURE YOU:  Understand these instructions.  Get help right away if you are not doing well or get worse.    Thank you for letting us be a part of your medical care team.  It is a privilege we respect greatly.  We hope these instructions will help you stay on track for a fast and full recovery!   Increase activity slowly as tolerated   Complete by:  As directed       DISCHARGE MEDICATIONS:   Allergies as of 08/28/2017   No Known Allergies     Medication List    STOP taking these medications   minocycline 100 MG capsule Commonly known as:  MINOCIN,DYNACIN     TAKE these medications   apixaban 5 MG Tabs tablet Commonly known as:  ELIQUIS Take 1 tablet (5 mg total) by mouth 2 (two) times daily. Please schedule appointment for refills.   co-enzyme Q-10 30 MG capsule Take 30 mg by mouth every other day.   fluticasone 50 MCG/ACT nasal spray Commonly known as:  FLONASE Place 1 spray into both nostrils every other day.   lisinopril 5 MG tablet Commonly known as:  PRINIVIL,ZESTRIL Take 1 tablet (5 mg total) by mouth daily.    methocarbamol 500 MG tablet Commonly known as:  ROBAXIN Take 1-2 tablets (500-1,000 mg total) by mouth every 6 (six) hours as needed for muscle spasms.   multivitamin tablet Take 1 tablet by mouth daily.   Oxycodone HCl 10 MG Tabs Take 1 tablet (10 mg total) by mouth every 4 (four) hours as needed (pain score 4-6).   simvastatin 20 MG tablet Commonly known as:  ZOCOR Take 1 tablet (20 mg total) by mouth at bedtime. What changed:  when to take this            Durable Medical Equipment  (From admission, onward)        Start     Ordered   08/27/17 1116  DME Walker rolling  Once    Question:  Patient needs a walker to treat with the following condition  Answer:  S/P total knee replacement   08/27/17 1115   08/27/17 1116  DME 3 n 1  Once     08/27/17 1115   08/27/17 1116  DME Bedside commode  Once    Question:  Patient needs a bedside commode to treat with the following condition  Answer:  S/P total knee replacement   08/27/17 1115      FOLLOW UP VISIT:    DISPOSITION: HOME VS. SNF  CONDITION:  Good   Donia Ast 08/28/2017, 12:35 PM

## 2017-08-28 NOTE — Op Note (Signed)
TOTAL KNEE REPLACEMENT OPERATIVE NOTE:  08/27/2017  5:29 PM  PATIENT:  Gary [name]  82 y.o. male  PRE-OPERATIVE DIAGNOSIS:  primary osteoarthritis right knee  POST-OPERATIVE DIAGNOSIS:  primary osteoarthritis right knee  PROCEDURE:  Procedure(s): RIGHT TOTAL KNEE ARTHROPLASTY  SURGEON:  Surgeon(s): Vickey Huger, MD  PHYSICIAN ASSISTANT: Carlyon Shadow, [facility]   ANESTHESIA:   spinal  DRAINS: Hemovac  SPECIMEN: None  COUNTS:  Correct  TOURNIQUET:   Total Tourniquet Time Documented: Thigh (Right) - 40 minutes Total: Thigh (Right) - 40 minutes   DICTATION:  Indication for procedure:    The patient is a 82 y.o. male who has failed conservative treatment for primary osteoarthritis right knee.  Informed consent was obtained prior to anesthesia. The risks versus benefits of the operation were explain and in a way the patient can, and did, understand.   On the implant demand matching protocol, this patient scored 9.  Therefore, this patient was not receive a polyethylene insert with vitamin E which is a high demand implant.  Description of procedure:     The patient was taken to the operating room and placed under anesthesia.  The patient was positioned in the usual fashion taking care that all body parts were adequately padded and/or protected.  I foley catheter was not placed.  A tourniquet was applied and the leg prepped and draped in the usual sterile fashion.  The extremity was exsanguinated with the esmarch and tourniquet inflated to 350 mmHg.  Pre-operative range of motion was normal.  The knee was in 5 degree of mild varus.  A midline incision approximately 6-7 inches long was made with a #10 blade.  A new blade was used to make a parapatellar arthrotomy going 2-3 cm into the quadriceps tendon, over the patella, and alongside the medial aspect of the patellar tendon.  A synovectomy was then performed with the #10 blade and forceps. I then elevated the deep MCL off the  medial tibial metaphysis subperiosteally around to the semimembranosus attachment.    I everted the patella and used calipers to measure patellar thickness.  I used the reamer to ream down to appropriate thickness to recreate the native thickness.  I then removed excess bone with the rongeur and sagittal saw.  I used the appropriately sized template and drilled the three lug holes.  I then put the trial in place and measured the thickness with the calipers to ensure recreation of the native thickness.  The trial was then removed and the patella subluxed and the knee brought into flexion.  A homan retractor was place to retract and protect the patella and lateral structures.  A Z-retractor was place medially to protect the medial structures.  The extra-medullary alignment system was used to make cut the tibial articular surface perpendicular to the anamotic axis of the tibia and in 3 degrees of posterior slope.  The cut surface and alignment jig was removed.  I then used the intramedullary alignment guide to make a 6 valgus cut on the distal femur.  I then marked out the epicondylar axis on the distal femur.  The posterior condylar axis measured 3 degrees.  I then used the anterior referencing sizer and measured the femur to be a size 11.  The 4-In-1 cutting block was screwed into place in external rotation matching the posterior condylar angle, making our cuts perpendicular to the epicondylar axis.  Anterior, posterior and chamfer cuts were made with the sagittal saw.  The cutting block and cut  pieces were removed.  A lamina spreader was placed in 90 degrees of flexion.  The ACL, PCL, menisci, and posterior condylar osteophytes were removed.  A 10 mm spacer blocked was found to offer good flexion and extension gap balance after mild in degree releasing.   The scoop retractor was then placed and the femoral finishing block was pinned in place.  The small sagittal saw was used as well as the lug drill to  finish the femur.  The block and cut surfaces were removed and the medullary canal hole filled with autograft bone from the cut pieces.  The tibia was delivered forward in deep flexion and external rotation.  A size G tray was selected and pinned into place centered on the medial 1/3 of the tibial tubercle.  The reamer and keel was used to prepare the tibia through the tray.    I then trialed with the size 11 femur, size G tibia, a 10 mm insert and the 38 patella.  I had excellent flexion/extension gap balance, excellent patella tracking.  Flexion was full and beyond 120 degrees; extension was zero.  These components were chosen and the staff opened them to me on the back table while the knee was lavaged copiously and the cement mixed.  The soft tissue was infiltrated with 60cc of exparel 1.3% through a 21 gauge needle.  I cemented in the components and removed all excess cement.  The polyethylene tibial component was snapped into place and the knee placed in extension while cement was hardening.  The capsule was infilltrated with 30cc of .25% Marcaine with epinephrine.  A hemovac was place in the joint exiting superolaterally.  A pain pump was place superomedially superficial to the arthrotomy.  Once the cement was hard, the tourniquet was let down.  Hemostasis was obtained.  The arthrotomy was closed with figure-8 #1 vicryl sutures.  The deep soft tissues were closed with #0 vicryls and the subcuticular layer closed with a running #2-0 vicryl.  The skin was reapproximated and closed with skin staples.  The wound was dressed with xeroform, 4 x4's, 2 ABD sponges, a single layer of webril and a TED stocking.   The patient was then awakened, extubated, and taken to the recovery room in stable condition.  BLOOD LOSS:  300cc DRAINS: 1 hemovac, 1 pain catheter COMPLICATIONS:  None.  PLAN OF CARE: Admit for overnight observation  PATIENT DISPOSITION:  PACU - hemodynamically stable.   Delay start of  Pharmacological VTE agent (>24hrs) due to surgical blood loss or risk of bleeding:  not applicable  Please fax a copy of this op note to my office at [phone] (please only include page 1 and 2 of the Case Information op note)

## 2017-08-28 NOTE — Progress Notes (Signed)
RT set up CPAP and placed on patient. Patient tolerating well at this time. RT will monitor as needed.  

## 2017-08-28 NOTE — Care Management Obs Status (Signed)
[facility] NOTIFICATION   Patient Details  Name: Gary [name] MRN: [id number] Date of Birth: Jan 31, 1936   Medicare Observation Status Notification Given:  Yes    Carles Collet, RN 08/28/2017, 9:46 AM

## 2017-08-28 NOTE — Progress Notes (Signed)
Physical Therapy Treatment Patient Details Name: Gary [name] MRN: [id number] DOB: 05/08/1935 Today's Date: 08/28/2017    History of Present Illness Pt is a 82 y/o male s/p elective R TKA. PMH includes HTN, skin cancer, and a flutter.     PT Comments    Pt mobilizing well, ambulated 250' with RW and supervision as well as practicing stairs. Pt has been compliant with CPM as well as zero knee foam. Discussed car transfer and activity level at d/c. Will see again in PM if he has not gone home.    Follow Up Recommendations  Follow surgeon's recommendation for DC plan and follow-up therapies;Supervision for mobility/OOB     Equipment Recommendations  None recommended by PT    Recommendations for Other Services OT consult     Precautions / Restrictions Precautions Precautions: Knee Precaution Booklet Issued: No Precaution Comments: Reviewed knee precautions Restrictions Weight Bearing Restrictions: Yes RLE Weight Bearing: Weight bearing as tolerated    Mobility  Bed Mobility Overal bed mobility: Modified Independent Bed Mobility: Supine to Sit     Supine to sit: Modified independent (Device/Increase time)     General bed mobility comments: pt able to lift RLE and get to EOB indepenently with bed flat  Transfers Overall transfer level: Needs assistance Equipment used: Rolling walker (2 wheeled) Transfers: Sit to/from Stand Sit to Stand: Supervision         General transfer comment: pt with safe standing from bed and chair  Ambulation/Gait Ambulation/Gait assistance: Supervision Ambulation Distance (Feet): 250 Feet Assistive device: Rolling walker (2 wheeled) Gait Pattern/deviations: Step-to pattern;Decreased step length - right;Decreased weight shift to right;Antalgic Gait velocity: Decreased Gait velocity interpretation: 1.31 - 2.62 ft/sec, indicative of limited community ambulator General Gait Details: vc's for erect posture and RW elevated which also  helped posture. vc's for knee flexion with swing through   Stairs Stairs: Yes Stairs assistance: Supervision Stair Management: No rails;Backwards;With walker Number of Stairs: 3 General stair comments: practiced stairs bkwd with RW as well as fwd with use of rails because he walls on both sides of his entrance stairs. Pt safe with both techniques.    Wheelchair Mobility    Modified Rankin (Stroke Patients Only)       Balance Overall balance assessment: Needs assistance Sitting-balance support: No upper extremity supported;Feet supported Sitting balance-Leahy Scale: Good     Standing balance support: During functional activity Standing balance-Leahy Scale: Fair Standing balance comment: pt able to maintain static stance without UE support                            Cognition Arousal/Alertness: Awake/alert Behavior During Therapy: WFL for tasks assessed/performed Overall Cognitive Status: Within Functional Limits for tasks assessed                                        Exercises Total Joint Exercises Ankle Circles/Pumps: AROM;Both;20 reps Quad Sets: AROM;Right;10 reps Heel Slides: AROM;Right;10 reps Hip ABduction/ADduction: AROM;Right;10 reps Straight Leg Raises: AROM;Right;10 reps Long Arc Quad: AROM;Right;10 reps Knee Flexion: AROM;Right;10 reps Goniometric ROM: 0-90    General Comments General comments (skin integrity, edema, etc.): reviewed car transfer and activity level at home      Pertinent Vitals/Pain Pain Assessment: Faces Faces Pain Scale: Hurts little more Pain Location: R knee  Pain Descriptors / Indicators: Aching;Operative site guarding Pain Intervention(s): Limited  activity within patient's tolerance    Home Living                      Prior Function            PT Goals (current goals can now be found in the care plan section) Acute Rehab PT Goals Patient Stated Goal: to go home  PT Goal Formulation:  With patient Time For Goal Achievement: 09/10/17 Potential to Achieve Goals: Good Progress towards PT goals: Progressing toward goals    Frequency    7X/week      PT Plan Current plan remains appropriate    Co-evaluation              AM-PAC PT "6 Clicks" Daily Activity  Outcome Measure  Difficulty turning over in bed (including adjusting bedclothes, sheets and blankets)?: A Little Difficulty moving from lying on back to sitting on the side of the bed? : A Little Difficulty sitting down on and standing up from a chair with arms (e.g., wheelchair, bedside commode, etc,.)?: A Little Help needed moving to and from a bed to chair (including a wheelchair)?: A Little Help needed walking in hospital room?: A Little Help needed climbing 3-5 steps with a railing? : A Little 6 Click Score: 18    End of Session Equipment Utilized During Treatment: Gait belt Activity Tolerance: Patient tolerated treatment well Patient left: in chair;with call bell/phone within reach Nurse Communication: Mobility status PT Visit Diagnosis: Other abnormalities of gait and mobility (R26.89);Pain Pain - Right/Left: Right Pain - part of body: Knee     Time: 3646-8032 PT Time Calculation (min) (ACUTE ONLY): 33 min  Charges:  $Gait Training: 8-22 mins $Therapeutic Exercise: 8-22 mins                    G Codes:       Leighton Roach, PT  Acute Rehab Services  [place] 08/28/2017, 12:44 PM

## 2017-08-28 NOTE — Evaluation (Signed)
Occupational Therapy Evaluation Patient Details Name: Gary [name] MRN: [id number] DOB: [birth year] Today's Date: 08/28/2017    History of Present Illness Pt is a 82 y/o male s/p elective R TKA. PMH includes HTN, skin cancer, and a flutter.    Clinical Impression   Pt with decline in function and safety with ADLs and ADL mobility with decreased balance and endurance. Pt would benefit from acute OT services to address impairments to maximize level of function and safety    Follow Up Recommendations  Follow surgeon's recommendation for DC plan and follow-up therapies;Supervision - Intermittent    Equipment Recommendations  None recommended by OT    Recommendations for Other Services       Precautions / Restrictions Precautions Precautions: Knee Precaution Booklet Issued: No Precaution Comments: Reviewed knee precautions Restrictions Weight Bearing Restrictions: Yes RLE Weight Bearing: Weight bearing as tolerated      Mobility Bed Mobility Overal bed mobility: Modified Independent Bed Mobility: Supine to Sit     Supine to sit: Modified independent (Device/Increase time)     General bed mobility comments: pt up in chair upon arrival  Transfers Overall transfer level: Needs assistance Equipment used: Rolling walker (2 wheeled) Transfers: Sit to/from Stand Sit to Stand: Supervision         General transfer comment: pt with safe standing from bed and chair    Balance Overall balance assessment: Needs assistance Sitting-balance support: No upper extremity supported;Feet supported Sitting balance-Leahy Scale: Good     Standing balance support: During functional activity;Single extremity supported;Bilateral upper extremity supported Standing balance-Leahy Scale: Fair Standing balance comment: pt able to maintain static stance without UE support                           ADL either performed or assessed with clinical judgement   ADL Overall ADL's :  Needs assistance/impaired Eating/Feeding: Independent;Sitting   Grooming: Wash/dry hands;Wash/dry face;Supervision/safety;Set up   Upper Body Bathing: Set up;Sitting   Lower Body Bathing: Minimal assistance   Upper Body Dressing : Set up;Sitting   Lower Body Dressing: Minimal assistance   Toilet Transfer: Supervision/safety;Ambulation;RW;Grab bars;Comfort height toilet   Toileting- Clothing Manipulation and Hygiene: Min guard;Sit to/from stand   Tub/ Shower Transfer: Supervision/safety;Ambulation;Rolling walker;3 in 1;Grab bars   Functional mobility during ADLs: Supervision/safety       Vision Baseline Vision/History: Wears glasses Wears Glasses: Reading only Patient Visual Report: No change from baseline       Perception     Praxis      Pertinent Vitals/Pain Pain Assessment: 0-10 Pain Score: 3  Faces Pain Scale: Hurts little more Pain Location: R knee  Pain Descriptors / Indicators: Aching;Operative site guarding Pain Intervention(s): Monitored during session;Repositioned     Hand Dominance Right   Extremity/Trunk Assessment Upper Extremity Assessment Upper Extremity Assessment: Overall WFL for tasks assessed   Lower Extremity Assessment Lower Extremity Assessment: Defer to PT evaluation   Cervical / Trunk Assessment Cervical / Trunk Assessment: Normal   Communication Communication Communication: No difficulties   Cognition Arousal/Alertness: Awake/alert Behavior During Therapy: WFL for tasks assessed/performed Overall Cognitive Status: Within Functional Limits for tasks assessed                                     General Comments  reviewed car transfer and activity level at home    Exercises Exercises: Total Joint Total  Joint Exercises Ankle Circles/Pumps: AROM;Both;20 reps Quad Sets: AROM;Right;10 reps Heel Slides: AROM;Right;10 reps Hip ABduction/ADduction: AROM;Right;10 reps Straight Leg Raises: AROM;Right;10 reps Long Arc  Quad: AROM;Right;10 reps Knee Flexion: AROM;Right;10 reps Goniometric ROM: 0-90   Shoulder Instructions      Home Living Family/patient expects to be discharged to:: Private residence Living Arrangements: Spouse/significant other Available Help at Discharge: Family;Available 24 hours/day Type of Home: House Home Access: Stairs to enter [employer] of Steps: 2 Entrance Stairs-Rails: None Home Layout: Two level;Able to live on main level with bedroom/bathroom     Bathroom Shower/Tub: Occupational psychologist: Standard     Home Equipment: Environmental consultant - 2 wheels;Bedside commode          Prior Functioning/Environment Level of Independence: Independent                 OT Problem List: Decreased activity tolerance;Decreased knowledge of use of DME or AE;Pain;Impaired balance (sitting and/or standing)      OT Treatment/Interventions: Self-care/ADL training;DME and/or AE instruction;Therapeutic activities;Therapeutic exercise;Patient/family education    OT Goals(Current goals can be found in the care plan section) Acute Rehab OT Goals Patient Stated Goal: to go home  OT Goal Formulation: With patient Time For Goal Achievement: 09/11/17 Potential to Achieve Goals: Good ADL Goals Pt Will Perform Grooming: with modified independence;standing Pt Will Perform Lower Body Bathing: with min guard assist;with supervision;with caregiver independent in assisting Pt Will Perform Lower Body Dressing: with min guard assist;with supervision;with caregiver independent in assisting Pt Will Transfer to Toilet: with modified independence;ambulating Pt Will Perform Toileting - Clothing Manipulation and hygiene: with supervision;with caregiver independent in assisting  OT Frequency: Min 2X/week   Barriers to D/C:    no barriers, will have 24/7 assist at home prn       Co-evaluation              AM-PAC PT "6 Clicks" Daily Activity     Outcome Measure Help from  another person eating meals?: None Help from another person taking care of personal grooming?: A Little Help from another person toileting, which includes using toliet, bedpan, or urinal?: A Little Help from another person bathing (including washing, rinsing, drying)?: A Little Help from another person to put on and taking off regular upper body clothing?: A Little Help from another person to put on and taking off regular lower body clothing?: A Little 6 Click Score: 19   End of Session Equipment Utilized During Treatment: Gait belt;Rolling walker;Other (comment)(3 in 1) CPM Right Knee CPM Right Knee: Off  Activity Tolerance: Patient tolerated treatment well Patient left: in chair;with call bell/phone within reach  OT Visit Diagnosis: Other abnormalities of gait and mobility (R26.89);Muscle weakness (generalized) (M62.81);Pain Pain - Right/Left: Right Pain - part of body: Knee                Time: 2831-5176 OT Time Calculation (min): 24 min Charges:  OT General Charges $OT Visit: 1 Visit OT Evaluation $OT Eval Low Complexity: 1 Low OT Treatments $Therapeutic Activity: 8-22 mins G-Codes: OT G-codes **NOT FOR INPATIENT CLASS** Functional Assessment Tool Used: AM-PAC 6 Clicks Daily Activity     Britt Bottom 08/28/2017, 1:35 PM

## 2017-08-28 NOTE — Care Management Note (Signed)
Case Management Note  Patient Details  Name: Gary [name] MRN: [id number] Date of Birth: 09-Oct-1935  Subjective/Objective:   82 yr old gentleman s/p right total knee arthroplasty.                 Action/Plan: Patient was preoperatively setup with [facility], no changes. He will have family support at discharge.   Expected Discharge Date:  08/28/17               Expected Discharge Plan:  [place]  In-House Referral:  NA  Discharge planning Services  CM Consult  Post Acute Care Choice:  Durable Medical Equipment, Home Health Choice offered to:  Patient  DME Arranged:  3-N-1, CPM, Walker rolling DME Agency:  [employer]  HH Arranged:  PT [place]:  [facility] [employer]  Status of Service:  Completed, signed off  If discussed at [employer], dates discussed:    Additional Comments:  Ninfa Meeker, RN 08/28/2017, 2:22 PM

## 2017-08-29 DIAGNOSIS — I499 Cardiac arrhythmia, unspecified: Secondary | ICD-10-CM | POA: Diagnosis not present

## 2017-08-29 DIAGNOSIS — I483 Typical atrial flutter: Secondary | ICD-10-CM | POA: Diagnosis not present

## 2017-08-29 DIAGNOSIS — I10 Essential (primary) hypertension: Secondary | ICD-10-CM | POA: Diagnosis not present

## 2017-08-29 DIAGNOSIS — I350 Nonrheumatic aortic (valve) stenosis: Secondary | ICD-10-CM | POA: Diagnosis not present

## 2017-08-29 DIAGNOSIS — J45909 Unspecified asthma, uncomplicated: Secondary | ICD-10-CM | POA: Diagnosis not present

## 2017-08-29 DIAGNOSIS — Z471 Aftercare following joint replacement surgery: Secondary | ICD-10-CM | POA: Diagnosis not present

## 2017-08-30 DIAGNOSIS — I10 Essential (primary) hypertension: Secondary | ICD-10-CM | POA: Diagnosis not present

## 2017-08-30 DIAGNOSIS — I499 Cardiac arrhythmia, unspecified: Secondary | ICD-10-CM | POA: Diagnosis not present

## 2017-08-30 DIAGNOSIS — I483 Typical atrial flutter: Secondary | ICD-10-CM | POA: Diagnosis not present

## 2017-08-30 DIAGNOSIS — I350 Nonrheumatic aortic (valve) stenosis: Secondary | ICD-10-CM | POA: Diagnosis not present

## 2017-08-30 DIAGNOSIS — J45909 Unspecified asthma, uncomplicated: Secondary | ICD-10-CM | POA: Diagnosis not present

## 2017-08-30 DIAGNOSIS — Z471 Aftercare following joint replacement surgery: Secondary | ICD-10-CM | POA: Diagnosis not present

## 2017-08-31 DIAGNOSIS — J45909 Unspecified asthma, uncomplicated: Secondary | ICD-10-CM | POA: Diagnosis not present

## 2017-08-31 DIAGNOSIS — I499 Cardiac arrhythmia, unspecified: Secondary | ICD-10-CM | POA: Diagnosis not present

## 2017-08-31 DIAGNOSIS — I483 Typical atrial flutter: Secondary | ICD-10-CM | POA: Diagnosis not present

## 2017-08-31 DIAGNOSIS — I10 Essential (primary) hypertension: Secondary | ICD-10-CM | POA: Diagnosis not present

## 2017-08-31 DIAGNOSIS — Z471 Aftercare following joint replacement surgery: Secondary | ICD-10-CM | POA: Diagnosis not present

## 2017-08-31 DIAGNOSIS — I350 Nonrheumatic aortic (valve) stenosis: Secondary | ICD-10-CM | POA: Diagnosis not present

## 2017-09-03 DIAGNOSIS — I499 Cardiac arrhythmia, unspecified: Secondary | ICD-10-CM | POA: Diagnosis not present

## 2017-09-03 DIAGNOSIS — I350 Nonrheumatic aortic (valve) stenosis: Secondary | ICD-10-CM | POA: Diagnosis not present

## 2017-09-03 DIAGNOSIS — I483 Typical atrial flutter: Secondary | ICD-10-CM | POA: Diagnosis not present

## 2017-09-03 DIAGNOSIS — Z471 Aftercare following joint replacement surgery: Secondary | ICD-10-CM | POA: Diagnosis not present

## 2017-09-03 DIAGNOSIS — I10 Essential (primary) hypertension: Secondary | ICD-10-CM | POA: Diagnosis not present

## 2017-09-03 DIAGNOSIS — J45909 Unspecified asthma, uncomplicated: Secondary | ICD-10-CM | POA: Diagnosis not present

## 2017-09-05 DIAGNOSIS — Z471 Aftercare following joint replacement surgery: Secondary | ICD-10-CM | POA: Diagnosis not present

## 2017-09-05 DIAGNOSIS — I499 Cardiac arrhythmia, unspecified: Secondary | ICD-10-CM | POA: Diagnosis not present

## 2017-09-05 DIAGNOSIS — I483 Typical atrial flutter: Secondary | ICD-10-CM | POA: Diagnosis not present

## 2017-09-05 DIAGNOSIS — J45909 Unspecified asthma, uncomplicated: Secondary | ICD-10-CM | POA: Diagnosis not present

## 2017-09-05 DIAGNOSIS — I10 Essential (primary) hypertension: Secondary | ICD-10-CM | POA: Diagnosis not present

## 2017-09-05 DIAGNOSIS — I350 Nonrheumatic aortic (valve) stenosis: Secondary | ICD-10-CM | POA: Diagnosis not present

## 2017-09-06 DIAGNOSIS — Z96651 Presence of right artificial knee joint: Secondary | ICD-10-CM | POA: Diagnosis not present

## 2017-09-10 ENCOUNTER — Encounter: Payer: Self-pay | Admitting: Physical Therapy

## 2017-09-10 ENCOUNTER — Ambulatory Visit: Payer: Medicare HMO | Attending: Orthopedic Surgery | Admitting: Physical Therapy

## 2017-09-10 DIAGNOSIS — M25561 Pain in right knee: Secondary | ICD-10-CM | POA: Insufficient documentation

## 2017-09-10 DIAGNOSIS — M25661 Stiffness of right knee, not elsewhere classified: Secondary | ICD-10-CM | POA: Insufficient documentation

## 2017-09-10 DIAGNOSIS — R600 Localized edema: Secondary | ICD-10-CM | POA: Diagnosis not present

## 2017-09-10 DIAGNOSIS — R262 Difficulty in walking, not elsewhere classified: Secondary | ICD-10-CM | POA: Diagnosis not present

## 2017-09-10 NOTE — Therapy (Signed)
[facility] [address] [place], [place], [address] Phone: [phone]   Fax:  [phone]  Physical Therapy Evaluation  Patient Details  Name: Gary [name] MRN: [id number] Date of Birth: 02/01/1936 Referring Provider: Ronnie Derby   Encounter Date: 09/10/2017  PT End of Session - 09/10/17 1605    Visit Number  1    Date for PT Re-Evaluation  11/10/17    PT Start Time  2458    PT Stop Time  1620    PT Time Calculation (min)  45 min    Activity Tolerance  Patient tolerated treatment well    Behavior During Therapy  [facility] for tasks assessed/performed       Past Medical History:  Diagnosis Date  . Adenomatous colon polyp   . ALLERGIC RHINITIS 10/03/2007  . Arthritis   . Atrial flutter ([place]) 06/26/2014  . Cancer ([place])    skin cancers removed in the past basal cell and squamous cell  . CARDIAC MURMUR, AORTIC 10/03/2007  . ERECTILE DYSFUNCTION 10/03/2007  . High blood pressure 03/2016  . HYPERLIPIDEMIA 10/03/2007  . Rosacea   . Sleep apnea     Past Surgical History:  Procedure Laterality Date  . ADENOIDECTOMY    . CARDIOVERSION N/A 07/01/2014   Procedure: CARDIOVERSION;  Surgeon: [name], MD;  Location: [facility] ENDOSCOPY;  Service: Cardiovascular;  Laterality: N/A;  . COLONOSCOPY    . TEE WITHOUT CARDIOVERSION N/A 07/01/2014   Procedure: TRANSESOPHAGEAL ECHOCARDIOGRAM (TEE);  Surgeon: [name], MD;  Location: [facility] ENDOSCOPY;  Service: Cardiovascular;  Laterality: N/A;  . TONSILLECTOMY    . TOTAL KNEE ARTHROPLASTY Right 08/27/2017  . TOTAL KNEE ARTHROPLASTY Right 08/27/2017   Procedure: RIGHT TOTAL KNEE ARTHROPLASTY;  Surgeon: Vickey Huger, MD;  Location: [facility];  Service: Orthopedics;  Laterality: Right;    There were no vitals filed for this visit.   Subjective Assessment - 09/10/17 1538    Subjective  Patient reports that he had been having right knee pain since Thanksgiving.  He had cortisone and other injections  but no better.  He underwent a right TKR on 08/27/17.  He reports that he has been doing okay but reports he has "a lot of pain"    Limitations  Walking;Standing;House hold activities    Patient Stated Goals  have less pain and normal motions, play golf    Currently in Pain?  Yes    Pain Score  4     Pain Location  Knee    Pain Orientation  Right    Pain Descriptors / Indicators  Aching;Dull    Pain Type  Acute pain;Surgical pain    Pain Onset  1 to 4 weeks ago    Pain Frequency  Constant    Aggravating Factors   first few steps, trying to get comfortable pain is up to 6/10    Pain Relieving Factors  rest, pain meds pain can be 2/10    Effect of Pain on Daily Activities  limits everything right now         [facility] PT Assessment - 09/10/17 0001      Assessment   Medical Diagnosis  s/p right TKR    Referring Provider  [place]    Onset Date/Surgical Date  08/27/17    Prior Therapy  some home PT      Precautions   Precautions  None      Balance Screen   Has the patient fallen in the  past 6 months  No    Has the patient had a decrease in activity level because of a fear of falling?   No    Is the patient reluctant to leave their home because of a fear of falling?   No      Home Environment   Additional Comments  has steps into the home, does yardwork and gardening      Prior Function   Level of Independence  Independent    Vocation  Retired    Leisure  would like to return to golf 2-3x/week, some gym exercise      Observation/Other Assessments-Edema    Edema  Circumferential      Circumferential Edema   Circumferential - Right  49.5 mid patella, some pitting edema    Circumferential - Left   43.5      ROM / Strength   AROM / PROM / Strength  AROM;PROM;Strength      AROM   AROM Assessment Site  Knee    Right/Left Knee  Right    Right Knee Extension  12    Right Knee Flexion  93      PROM   PROM Assessment Site  Knee    Right/Left Knee  Right    Right Knee Extension   8    Right Knee Flexion  100      Strength   Overall Strength Comments  4+/5 with some pain      Palpation   Palpation comment  pitting edema knee and leg, has bruising in the right upper thigh possibly where the tourniquet was.  Mild warmth,, steri strips in place, scar is tight distally      Ambulation/Gait   Gait Comments  not using a device today, the first few steps are very antalgic, currently not using a device,                 Objective measurements completed on examination: See above findings.      [place] Adult PT Treatment/Exercise - 09/10/17 0001      Exercises   Exercises  Knee/Hip      Knee/Hip Exercises: Aerobic   Nustep  level 3 x 6 minutes      Modalities   Modalities  Vasopneumatic      Vasopneumatic   Number Minutes Vasopneumatic   15 minutes    Vasopnuematic Location   Knee    Vasopneumatic Pressure  Medium    Vasopneumatic Temperature   35             PT Education - 09/10/17 1603    Education provided  Yes    Education Details  HEP for LLLD stretch for flexiona nd encouraged him to continue with the HEP from the Home PT    Person(s) Educated  Patient    Methods  Explanation;Demonstration;Handout    Comprehension  Verbalized understanding       PT Short Term Goals - 09/10/17 1608      PT SHORT TERM GOAL #1   Title  independent with initial HEP    Time  2    Period  Weeks    Status  New        PT Long Term Goals - 09/10/17 1608      PT LONG TERM GOAL #1   Title  independent with RICE    Time  8    Period  Weeks    Status  New  PT LONG TERM GOAL #2   Title  go up and down stairs step over step    Time  8    Period  Weeks    Status  New      PT LONG TERM GOAL #3   Title  increase AROM to 5-120 degrees flexion    Time  8    Period  Weeks    Status  New      PT LONG TERM GOAL #4   Title  decrease pain 50%    Time  8    Period  Weeks    Status  New      PT LONG TERM GOAL #5   Title  report no difficulty  with sleeping    Time  8    Period  Weeks    Status  New             Plan - 09/10/17 1605    Clinical Impression Statement  Patient underwent a right TKR on 08/27/17, he reports that he started having pain in November, he would like to get back to playing golf 3x/week.  He has c/o pain in the knee and the right thigh, he has bruising in the upper thigh area.  He has 5.5 cm of swellin in th eright knee compared to the left, pitting edema.  He is walking without a device but reports that the first few steps are painful and he has a significant limp but this smooths out after about 5 steps.    Clinical Presentation  Evolving    Clinical Decision Making  Low    Rehab Potential  Good    PT Frequency  3x / week    PT Duration  8 weeks    PT Treatment/Interventions  ADLs/Self Care Home Management;Cryotherapy;Electrical Stimulation;Gait training;Stair training;Functional mobility training;Therapeutic exercise;Therapeutic activities;Balance training;Neuromuscular re-education;Manual techniques;Vasopneumatic Device;Patient/family education    PT Next Visit Plan  start TKR exercises, address swelling     Consulted and Agree with Plan of Care  Patient       Patient will benefit from skilled therapeutic intervention in order to improve the following deficits and impairments:  Abnormal gait, Decreased range of motion, Difficulty walking, Decreased activity tolerance, Pain, Impaired flexibility, Decreased scar mobility, Decreased mobility, Decreased strength, Increased edema  Visit Diagnosis: Acute pain of right knee - Plan: PT plan of care cert/re-cert  Stiffness of right knee, not elsewhere classified - Plan: PT plan of care cert/re-cert  Localized edema - Plan: PT plan of care cert/re-cert  Difficulty in walking, not elsewhere classified - Plan: PT plan of care cert/re-cert     Problem List Patient Active Problem List   Diagnosis Date Noted  . S/P total knee replacement 08/27/2017  .  Essential hypertension 04/05/2016  . Routine general medical examination at a health care facility 05/31/2015  . Obstructive sleep apnea 01/01/2015  . Periodic limb movement 01/01/2015  . Atrial flutter, unspecified   . Typical atrial flutter ([place])   . Atrial flutter ([place]) 06/26/2014  . Cardiac arrhythmia 05/14/2014  . BPH associated with nocturia 05/12/2014  . Calculus of parotid gland 09/02/2013  . Murmur, heart 01/22/2012  . Aortic stenosis 12/20/2010  . LIBIDO, DECREASED 10/19/2009  . Hyperlipidemia 10/03/2007  . ERECTILE DYSFUNCTION 10/03/2007  . ALLERGIC RHINITIS 10/03/2007    Sumner Boast., PT 09/10/2017, 4:12 PM  [facility] [address] [place], [place], [address] Phone: [phone]   Fax:  [phone]  Name: Gary [name] MRN: [id number] Date of Birth: October 06, 1935

## 2017-09-11 ENCOUNTER — Ambulatory Visit: Payer: Medicare HMO | Admitting: Physical Therapy

## 2017-09-11 DIAGNOSIS — M25561 Pain in right knee: Secondary | ICD-10-CM | POA: Diagnosis not present

## 2017-09-11 DIAGNOSIS — R600 Localized edema: Secondary | ICD-10-CM

## 2017-09-11 DIAGNOSIS — R262 Difficulty in walking, not elsewhere classified: Secondary | ICD-10-CM

## 2017-09-11 DIAGNOSIS — M25661 Stiffness of right knee, not elsewhere classified: Secondary | ICD-10-CM

## 2017-09-11 NOTE — Therapy (Signed)
[place], [place], [address] Phone: [phone]   Fax:  [phone]  Physical Therapy Treatment  Patient Details  Name: Gary [name] MRN: [id number] Date of Birth: [birth year] Referring Provider: Ronnie Derby   Encounter Date: 09/11/2017  PT End of Session - 09/11/17 1352    Visit Number  2    Date for PT Re-Evaluation  11/10/17    PT Start Time  1315    PT Stop Time  1410    PT Time Calculation (min)  55 min       Past Medical History:  Diagnosis Date  . Adenomatous colon polyp   . ALLERGIC RHINITIS 10/03/2007  . Arthritis   . Atrial flutter ([place]) 06/26/2014  . Cancer ([place])    skin cancers removed in the past basal cell and squamous cell  . CARDIAC MURMUR, AORTIC 10/03/2007  . ERECTILE DYSFUNCTION 10/03/2007  . High blood pressure 03/2016  . HYPERLIPIDEMIA 10/03/2007  . Rosacea   . Sleep apnea     Past Surgical History:  Procedure Laterality Date  . ADENOIDECTOMY    . CARDIOVERSION N/A 07/01/2014   Procedure: CARDIOVERSION;  Surgeon: [name], MD;  [facility];  Service: Cardiovascular;  Laterality: N/A;  . COLONOSCOPY    . TEE WITHOUT CARDIOVERSION N/A 07/01/2014   Procedure: TRANSESOPHAGEAL ECHOCARDIOGRAM (TEE);  Surgeon: [name], MD;  Location: [facility] ENDOSCOPY;  Service: Cardiovascular;  Laterality: N/A;  . TONSILLECTOMY    . TOTAL KNEE ARTHROPLASTY Right 08/27/2017  . TOTAL KNEE ARTHROPLASTY Right 08/27/2017   Procedure: RIGHT TOTAL KNEE ARTHROPLASTY;  Surgeon: Vickey Huger, MD;  Location: [facility];  Service: Orthopedics;  Laterality: Right;    There were no vitals filed for this visit.  Subjective Assessment - 09/11/17 1317    Subjective  sore. doing HEP from HHPT and here    Currently in Pain?  Yes    Pain Score  5     Pain Location  Leg    Pain Orientation  Right                       [facility] Adult PT Treatment/Exercise - 09/11/17 0001      Knee/Hip Exercises: Aerobic   Recumbent Bike  5 min partial and some full rev    Nustep  L 5 6 min      Knee/Hip Exercises: Machines for Strengthening   Cybex Leg Press  20# 3 sets 10 focus on TKE      Knee/Hip Exercises: Standing   Heel Raises  10 reps;2 sets heel raises      Knee/Hip Exercises: Seated   Long Arc Quad  Strengthening;2 sets;10 reps;Weights 3 sec TKE hold    Other Seated Knee/Hip Exercises  TKE red tband 2 sets 10    Hamstring Curl  Both;2 sets;10 reps red tband      Modalities   Modalities  Vasopneumatic;Electrical Stimulation      Electrical Stimulation   Electrical Stimulation Location  RT knee    Electrical Stimulation Action  IFC    Electrical Stimulation Parameters  supine    Electrical Stimulation Goals  Edema      Vasopneumatic   Number Minutes Vasopneumatic   15 minutes    Vasopnuematic Location   Knee    Vasopneumatic Pressure  Medium      Manual Therapy   Manual Therapy  Soft tissue mobilization;Passive ROM    Soft tissue  mobilization  quad/IT    Passive ROM  flex/ext             PT Education - 09/10/17 1603    Education provided  Yes    Education Details  HEP for LLLD stretch for flexiona nd encouraged him to continue with the HEP from the Home PT    Person(s) Educated  Patient    Methods  Explanation;Demonstration;Handout    Comprehension  Verbalized understanding       PT Short Term Goals - 09/11/17 1353      PT SHORT TERM GOAL #1   Title  independent with initial HEP    Status  Achieved        PT Long Term Goals - 09/10/17 1608      PT LONG TERM GOAL #1   Title  independent with RICE    Time  8    Period  Weeks    Status  New      PT LONG TERM GOAL #2   Title  go up and down stairs step over step    Time  8    Period  Weeks    Status  New      PT LONG TERM GOAL #3   Title  increase AROM to 5-120 degrees flexion    Time  8    Period  Weeks    Status  New      PT LONG TERM GOAL #4   Title  decrease pain 50%     Time  8    Period  Weeks    Status  New      PT LONG TERM GOAL #5   Title  report no difficulty with sleeping    Time  8    Period  Weeks    Status  New            Plan - 09/11/17 1352    Clinical Impression Statement  pt tolerated all initail interventions well. quad lag present and quad pain and tightness noted. IFC and vaso for swelling    PT Treatment/Interventions  ADLs/Self Care Home Management;Cryotherapy;Electrical Stimulation;Gait training;Stair training;Functional mobility training;Therapeutic exercise;Therapeutic activities;Balance training;Neuromuscular re-education;Manual techniques;Vasopneumatic Device;Patient/family education    PT Next Visit Plan  start TKR exercises, address swelling        Patient will benefit from skilled therapeutic intervention in order to improve the following deficits and impairments:  Abnormal gait, Decreased range of motion, Difficulty walking, Decreased activity tolerance, Pain, Impaired flexibility, Decreased scar mobility, Decreased mobility, Decreased strength, Increased edema  Visit Diagnosis: Acute pain of right knee  Stiffness of right knee, not elsewhere classified  Localized edema  Difficulty in walking, not elsewhere classified     Problem List Patient Active Problem List   Diagnosis Date Noted  . S/P total knee replacement 08/27/2017  . Essential hypertension 04/05/2016  . Routine general medical examination at a health care facility 05/31/2015  . Obstructive sleep apnea 01/01/2015  . Periodic limb movement 01/01/2015  . Atrial flutter, unspecified   . Typical atrial flutter ([place])   . Atrial flutter ([place]) 06/26/2014  . Cardiac arrhythmia 05/14/2014  . BPH associated with nocturia 05/12/2014  . Calculus of parotid gland 09/02/2013  . Murmur, heart 01/22/2012  . Aortic stenosis 12/20/2010  . LIBIDO, DECREASED 10/19/2009  . Hyperlipidemia 10/03/2007  . ERECTILE DYSFUNCTION 10/03/2007  . ALLERGIC RHINITIS  10/03/2007    Shaneese Tait,ANGIE PTA 09/11/2017, 1:53 PM  [place]  [address] [place], [place], [address] Phone: [phone]   Fax:  [phone]  Name: Gary [name] MRN: [id number] Date of Birth: [birth year]

## 2017-09-17 ENCOUNTER — Ambulatory Visit: Payer: Medicare HMO | Admitting: Physical Therapy

## 2017-09-17 DIAGNOSIS — R262 Difficulty in walking, not elsewhere classified: Secondary | ICD-10-CM | POA: Diagnosis not present

## 2017-09-17 DIAGNOSIS — M25561 Pain in right knee: Secondary | ICD-10-CM

## 2017-09-17 DIAGNOSIS — R600 Localized edema: Secondary | ICD-10-CM | POA: Diagnosis not present

## 2017-09-17 DIAGNOSIS — M25661 Stiffness of right knee, not elsewhere classified: Secondary | ICD-10-CM | POA: Diagnosis not present

## 2017-09-17 NOTE — Therapy (Signed)
[place], [place], [address] Phone: [phone]   Fax:  [phone]  Physical Therapy Treatment  Patient Details  Name: PAVLOS YON MRN: [id number] Date of Birth: [birth year] Referring Provider: Ronnie Derby   Encounter Date: 09/17/2017  PT End of Session - 09/17/17 0851    Visit Number  3    Date for PT Re-Evaluation  11/10/17    PT Start Time  0850    PT Stop Time  0945    PT Time Calculation (min)  55 min    Activity Tolerance  Patient tolerated treatment well    Behavior During Therapy  [facility] for tasks assessed/performed       Past Medical History:  Diagnosis Date  . Adenomatous colon polyp   . ALLERGIC RHINITIS 10/03/2007  . Arthritis   . Atrial flutter ([place]) 06/26/2014  . Cancer ([place])    skin cancers removed in the past basal cell and squamous cell  . CARDIAC MURMUR, AORTIC 10/03/2007  . ERECTILE DYSFUNCTION 10/03/2007  . High blood pressure 03/2016  . HYPERLIPIDEMIA 10/03/2007  . Rosacea   . Sleep apnea     Past Surgical History:  Procedure Laterality Date  . ADENOIDECTOMY    . CARDIOVERSION N/A 07/01/2014   Procedure: CARDIOVERSION;  Surgeon: [name], MD;  [facility];  Service: Cardiovascular;  Laterality: N/A;  . COLONOSCOPY    . TEE WITHOUT CARDIOVERSION N/A 07/01/2014   Procedure: TRANSESOPHAGEAL ECHOCARDIOGRAM (TEE);  Surgeon: [name], MD;  Location: [facility] ENDOSCOPY;  Service: Cardiovascular;  Laterality: N/A;  . TONSILLECTOMY    . TOTAL KNEE ARTHROPLASTY Right 08/27/2017  . TOTAL KNEE ARTHROPLASTY Right 08/27/2017   Procedure: RIGHT TOTAL KNEE ARTHROPLASTY;  Surgeon: Vickey Huger, MD;  Location: [facility];  Service: Orthopedics;  Laterality: Right;    There were no vitals filed for this visit.  Subjective Assessment - 09/17/17 0851    Subjective  no new complaints    Patient Stated Goals  have less pain and normal motions, play golf    Currently in Pain?   No/denies         [facility] PT Assessment - 09/17/17 0001      AROM   Right Knee Extension  -8    Right Knee Flexion  113      PROM   Right Knee Extension  -6    Right Knee Flexion  120                   [facility] Adult PT Treatment/Exercise - 09/17/17 0001      Knee/Hip Exercises: Aerobic   Recumbent Bike  6 min full revolution seat 10 to 7    Nustep  L 5 x 6 min      Knee/Hip Exercises: Machines for Strengthening   Cybex Leg Press  20# 3 sets 10 focus on TKE      Knee/Hip Exercises: Standing   Heel Raises  10 reps;2 sets heel raises    Terminal Knee Extension  Strengthening;Right;20 reps 5 sec hold'      Knee/Hip Exercises: Seated   Long Arc Quad  Strengthening;2 sets;10 reps;Weights 3 sec TKE hold    Hamstring Curl  Both;2 sets;10 reps green tband      Modalities   Modalities  Electrical Stimulation;Vasopneumatic      Electrical Stimulation   Electrical Stimulation Location  Rt knee IFC 1-10 Hz x 15 min    Electrical Stimulation Goals  Edema      Vasopneumatic   Number Minutes Vasopneumatic   15 minutes    Vasopnuematic Location   Knee    Vasopneumatic Pressure  Medium    Vasopneumatic Temperature   34      Manual Therapy   Manual Therapy  Myofascial release;Passive ROM    Myofascial Release  to R lateral thigh    Passive ROM  flex/ext               PT Short Term Goals - 09/11/17 1353      PT SHORT TERM GOAL #1   Title  independent with initial HEP    Status  Achieved        PT Long Term Goals - 09/10/17 1608      PT LONG TERM GOAL #1   Title  independent with RICE    Time  8    Period  Weeks    Status  New      PT LONG TERM GOAL #2   Title  go up and down stairs step over step    Time  8    Period  Weeks    Status  New      PT LONG TERM GOAL #3   Title  increase AROM to 5-120 degrees flexion    Time  8    Period  Weeks    Status  New      PT LONG TERM GOAL #4   Title  decrease pain 50%    Time  8    Period  Weeks     Status  New      PT LONG TERM GOAL #5   Title  report no difficulty with sleeping    Time  8    Period  Weeks    Status  New            Plan - 09/17/17 1112    Clinical Impression Statement  Patient did well today and ROM has improved significantly from last visit.    Rehab Potential  Good    PT Frequency  3x / week    PT Duration  8 weeks    PT Treatment/Interventions  ADLs/Self Care Home Management;Cryotherapy;Electrical Stimulation;Gait training;Stair training;Functional mobility training;Therapeutic exercise;Therapeutic activities;Balance training;Neuromuscular re-education;Manual techniques;Vasopneumatic Device;Patient/family education    PT Next Visit Plan  start TKR exercises, address swelling     Consulted and Agree with Plan of Care  Patient       Patient will benefit from skilled therapeutic intervention in order to improve the following deficits and impairments:  Abnormal gait, Decreased range of motion, Difficulty walking, Decreased activity tolerance, Pain, Impaired flexibility, Decreased scar mobility, Decreased mobility, Decreased strength, Increased edema  Visit Diagnosis: Stiffness of right knee, not elsewhere classified  Acute pain of right knee  Localized edema     Problem List Patient Active Problem List   Diagnosis Date Noted  . S/P total knee replacement 08/27/2017  . Essential hypertension 04/05/2016  . Routine general medical examination at a health care facility 05/31/2015  . Obstructive sleep apnea 01/01/2015  . Periodic limb movement 01/01/2015  . Atrial flutter, unspecified   . Typical atrial flutter ([place])   . Atrial flutter ([place]) 06/26/2014  . Cardiac arrhythmia 05/14/2014  . BPH associated with nocturia 05/12/2014  . Calculus of parotid gland 09/02/2013  . Murmur, heart 01/22/2012  . Aortic stenosis 12/20/2010  . LIBIDO, DECREASED 10/19/2009  . Hyperlipidemia 10/03/2007  . ERECTILE DYSFUNCTION 10/03/2007  .  ALLERGIC RHINITIS  10/03/2007    Ziyan Schoon PT 09/17/2017, 11:41 AM  [place] [address] [place], [place], [address] Phone: [phone]   Fax:  [phone]  Name: ILAY CAPSHAW MRN: [id number] Date of Birth: 11/16/1935

## 2017-09-19 ENCOUNTER — Encounter: Payer: Self-pay | Admitting: Physical Therapy

## 2017-09-19 ENCOUNTER — Ambulatory Visit: Payer: Medicare HMO | Admitting: Physical Therapy

## 2017-09-19 DIAGNOSIS — R262 Difficulty in walking, not elsewhere classified: Secondary | ICD-10-CM

## 2017-09-19 DIAGNOSIS — M25661 Stiffness of right knee, not elsewhere classified: Secondary | ICD-10-CM

## 2017-09-19 DIAGNOSIS — R600 Localized edema: Secondary | ICD-10-CM

## 2017-09-19 DIAGNOSIS — M25561 Pain in right knee: Secondary | ICD-10-CM

## 2017-09-19 NOTE — Therapy (Signed)
[place], [place], [address] Phone: [phone]   Fax:  [phone]  Physical Therapy Treatment  Patient Details  Name: Gary [name] MRN: [id number] Date of Birth: Sep 18, 1935 Referring Provider: Ronnie Derby   Encounter Date: 09/19/2017  PT End of Session - 09/19/17 1011    Visit Number  4    Date for PT Re-Evaluation  11/10/17    PT Start Time  0927    PT Stop Time  1026    PT Time Calculation (min)  59 min    Activity Tolerance  Patient tolerated treatment well    Behavior During Therapy  [facility] for tasks assessed/performed       Past Medical History:  Diagnosis Date  . Adenomatous colon polyp   . ALLERGIC RHINITIS 10/03/2007  . Arthritis   . Atrial flutter ([place]) 06/26/2014  . Cancer ([place])    skin cancers removed in the past basal cell and squamous cell  . CARDIAC MURMUR, AORTIC 10/03/2007  . ERECTILE DYSFUNCTION 10/03/2007  . High blood pressure 03/2016  . HYPERLIPIDEMIA 10/03/2007  . Rosacea   . Sleep apnea     Past Surgical History:  Procedure Laterality Date  . ADENOIDECTOMY    . CARDIOVERSION N/A 07/01/2014   Procedure: CARDIOVERSION;  Surgeon: [name], MD;  [facility]OS[facility];  Service: Cardiovascular;  Laterality: N/A;  . COLONOSCOPY    . TEE WITHOUT CARDIOVERSION N/A 07/01/2014   Procedure: TRANSESOPHAGEAL ECHOCARDIOGRAM (TEE);  Surgeon: [name], MD;  Location: [facility] ENDOSCOPY;  Service: Cardiovascular;  Laterality: N/A;  . TONSILLECTOMY    . TOTAL KNEE ARTHROPLASTY Right 08/27/2017  . TOTAL KNEE ARTHROPLASTY Right 08/27/2017   Procedure: RIGHT TOTAL KNEE ARTHROPLASTY;  Surgeon: Vickey Huger, MD;  Location: [place];  Service: Orthopedics;  Laterality: Right;    There were no vitals filed for this visit.  Subjective Assessment - 09/19/17 0936    Subjective  Patient reports his biggest issue is tightness and swelling    Currently in Pain?  No/denies                        [facility] Adult PT Treatment/Exercise - 09/19/17 0001      Ambulation/Gait   Gait Comments  gait down stairs step over step and then outside up and down slope      Knee/Hip Exercises: Aerobic   Recumbent Bike  6 mins level 1    Nustep  L 5 x 6 min      Knee/Hip Exercises: Machines for Strengthening   Cybex Knee Extension  5# 2x10    Cybex Knee Flexion  25# 2x15    Cybex Leg Press  20# 2x10. then right only 2x8, then no weght 2x10      Knee/Hip Exercises: Standing   Terminal Knee Extension  Strengthening;Right;20 reps      Vasopneumatic   Number Minutes Vasopneumatic   15 minutes    Vasopnuematic Location   Knee    Vasopneumatic Pressure  Medium    Vasopneumatic Temperature   34               PT Short Term Goals - 09/11/17 1353      PT SHORT TERM GOAL #1   Title  independent with initial HEP    Status  Achieved        PT Long Term Goals - 09/19/17 1013      PT LONG TERM  GOAL #1   Title  independent with RICE    Status  Achieved      PT LONG TERM GOAL #2   Title  go up and down stairs step over step    Status  Partially Met            Plan - 09/19/17 1012    Clinical Impression Statement  Patient has some decreased conditioning, SOB after the walking.  He had weakness with TKE, needing VC's and TC's to get better extension.  Had some pain with right leg only leg extension, was able to do stairs step over step, had difficulty more with going up than down    PT Next Visit Plan  we will continue to work on strength and function    Consulted and Agree with Plan of Care  Patient       Patient will benefit from skilled therapeutic intervention in order to improve the following deficits and impairments:  Abnormal gait, Decreased range of motion, Difficulty walking, Decreased activity tolerance, Pain, Impaired flexibility, Decreased scar mobility, Decreased mobility, Decreased strength, Increased edema  Visit Diagnosis: Acute  pain of right knee  Stiffness of right knee, not elsewhere classified  Localized edema  Difficulty in walking, not elsewhere classified     Problem List Patient Active Problem List   Diagnosis Date Noted  . S/P total knee replacement 08/27/2017  . Essential hypertension 04/05/2016  . Routine general medical examination at a health care facility 05/31/2015  . Obstructive sleep apnea 01/01/2015  . Periodic limb movement 01/01/2015  . Atrial flutter, unspecified   . Typical atrial flutter ([place])   . Atrial flutter ([place]) 06/26/2014  . Cardiac arrhythmia 05/14/2014  . BPH associated with nocturia 05/12/2014  . Calculus of parotid gland 09/02/2013  . Murmur, heart 01/22/2012  . Aortic stenosis 12/20/2010  . LIBIDO, DECREASED 10/19/2009  . Hyperlipidemia 10/03/2007  . ERECTILE DYSFUNCTION 10/03/2007  . ALLERGIC RHINITIS 10/03/2007    Sumner Boast., PT 09/19/2017, 10:14 AM  [place] [address] [place], [place], [address] Phone: [phone]   Fax:  [phone]  Name: YIANNI SKILLING MRN: [id number] Date of Birth: 05-Jul-1935

## 2017-09-26 ENCOUNTER — Encounter: Payer: Self-pay | Admitting: Physical Therapy

## 2017-09-26 ENCOUNTER — Ambulatory Visit: Payer: Medicare HMO | Admitting: Physical Therapy

## 2017-09-26 DIAGNOSIS — R262 Difficulty in walking, not elsewhere classified: Secondary | ICD-10-CM | POA: Diagnosis not present

## 2017-09-26 DIAGNOSIS — M25561 Pain in right knee: Secondary | ICD-10-CM | POA: Diagnosis not present

## 2017-09-26 DIAGNOSIS — M25661 Stiffness of right knee, not elsewhere classified: Secondary | ICD-10-CM | POA: Diagnosis not present

## 2017-09-26 DIAGNOSIS — R600 Localized edema: Secondary | ICD-10-CM | POA: Diagnosis not present

## 2017-09-26 NOTE — Therapy (Signed)
[place], [place], [address] Phone: [phone]   Fax:  [phone]  Physical Therapy Treatment  Patient Details  Name: Gary [name] MRN: [id number] Date of Birth: 17-Apr-1936 Referring Provider: Ronnie Derby   Encounter Date: 09/26/2017  PT End of Session - 09/26/17 1612    Visit Number  5    Date for PT Re-Evaluation  11/10/17    PT Start Time  9381    PT Stop Time  1630    PT Time Calculation (min)  60 min    Activity Tolerance  Patient tolerated treatment well    Behavior During Therapy  [facility] for tasks assessed/performed       Past Medical History:  Diagnosis Date  . Adenomatous colon polyp   . ALLERGIC RHINITIS 10/03/2007  . Arthritis   . Atrial flutter ([place]) 06/26/2014  . Cancer ([place])    skin cancers removed in the past basal cell and squamous cell  . CARDIAC MURMUR, AORTIC 10/03/2007  . ERECTILE DYSFUNCTION 10/03/2007  . High blood pressure 03/2016  . HYPERLIPIDEMIA 10/03/2007  . Rosacea   . Sleep apnea     Past Surgical History:  Procedure Laterality Date  . ADENOIDECTOMY    . CARDIOVERSION N/A 07/01/2014   Procedure: CARDIOVERSION;  Surgeon: [name], MD;  [facility] END[facility];  Service: Cardiovascular;  Laterality: N/A;  . COLONOSCOPY    . TEE WITHOUT CARDIOVERSION N/A 07/01/2014   Procedure: TRANSESOPHAGEAL ECHOCARDIOGRAM (TEE);  Surgeon: [name], MD;  Location: [facility] ENDOSCOPY;  Service: Cardiovascular;  Laterality: N/A;  . TONSILLECTOMY    . TOTAL KNEE ARTHROPLASTY Right 08/27/2017  . TOTAL KNEE ARTHROPLASTY Right 08/27/2017   Procedure: RIGHT TOTAL KNEE ARTHROPLASTY;  Surgeon: Vickey Huger, MD;  Location: [place];  Service: Orthopedics;  Laterality: Right;    There were no vitals filed for this visit.  Subjective Assessment - 09/26/17 1536    Subjective  reports that he had dificulty on [place]    Currently in Pain?  No/denies                        [facility] Adult PT Treatment/Exercise - 09/26/17 0001      Ambulation/Gait   Gait Comments  gait up and down stairs step over step working on decreasing the compensation      High Level Balance   High Level Balance Comments  on airex ball toss, eyes closed, head turns, resisted gait all directions      Knee/Hip Exercises: Aerobic   Recumbent Bike  6 mins level 1    Nustep  L 5 x 6 min      Knee/Hip Exercises: Machines for Strengthening   Cybex Knee Extension  5# 2x10 cues for TKE a set of 10 with the right only    Cybex Knee Flexion  25# 2x15, then 15# right only    Cybex Leg Press  30# 2x10. then right only 2x8, then no weght 2x10      Vasopneumatic   Number Minutes Vasopneumatic   15 minutes    Vasopnuematic Location   Knee    Vasopneumatic Pressure  Medium    Vasopneumatic Temperature   34               PT Short Term Goals - 09/11/17 1353      PT SHORT TERM GOAL #1   Title  independent  with initial HEP    Status  Achieved        PT Long Term Goals - 09/26/17 1616      PT LONG TERM GOAL #2   Title  go up and down stairs step over step    Status  Partially Met      PT LONG TERM GOAL #5   Title  report no difficulty with sleeping    Status  Partially Met            Plan - 09/26/17 1612    Clinical Impression Statement  Patient SOB after some of the exercises, had some difficulty with balance on the airex.  He is overall doing very well, needs cues to get the TKE, swelling is the biggest issue    PT Next Visit Plan  we will continue to work on strength and function, could try tape for swelling    Consulted and Agree with Plan of Care  Patient       Patient will benefit from skilled therapeutic intervention in order to improve the following deficits and impairments:  Abnormal gait, Decreased range of motion, Difficulty walking, Decreased activity tolerance, Pain, Impaired flexibility, Decreased scar mobility, Decreased  mobility, Decreased strength, Increased edema  Visit Diagnosis: Acute pain of right knee  Stiffness of right knee, not elsewhere classified  Localized edema  Difficulty in walking, not elsewhere classified     Problem List Patient Active Problem List   Diagnosis Date Noted  . S/P total knee replacement 08/27/2017  . Essential hypertension 04/05/2016  . Routine general medical examination at a health care facility 05/31/2015  . Obstructive sleep apnea 01/01/2015  . Periodic limb movement 01/01/2015  . Atrial flutter, unspecified   . Typical atrial flutter ([place])   . Atrial flutter ([place]) 06/26/2014  . Cardiac arrhythmia 05/14/2014  . BPH associated with nocturia 05/12/2014  . Calculus of parotid gland 09/02/2013  . Murmur, heart 01/22/2012  . Aortic stenosis 12/20/2010  . LIBIDO, DECREASED 10/19/2009  . Hyperlipidemia 10/03/2007  . ERECTILE DYSFUNCTION 10/03/2007  . ALLERGIC RHINITIS 10/03/2007    Sumner Boast., PT 09/26/2017, 4:17 PM  [place] [address] [place], [place], [address] Phone: [phone]   Fax:  [phone]  Name: Gary [name] MRN: [id number] Date of Birth: 21-Dec-1935

## 2017-09-28 ENCOUNTER — Encounter: Payer: Self-pay | Admitting: Physical Therapy

## 2017-09-28 ENCOUNTER — Ambulatory Visit: Payer: Medicare HMO | Admitting: Physical Therapy

## 2017-09-28 DIAGNOSIS — M25561 Pain in right knee: Secondary | ICD-10-CM | POA: Diagnosis not present

## 2017-09-28 DIAGNOSIS — R600 Localized edema: Secondary | ICD-10-CM

## 2017-09-28 DIAGNOSIS — R262 Difficulty in walking, not elsewhere classified: Secondary | ICD-10-CM | POA: Diagnosis not present

## 2017-09-28 DIAGNOSIS — M25661 Stiffness of right knee, not elsewhere classified: Secondary | ICD-10-CM

## 2017-09-28 NOTE — Therapy (Signed)
[place], [place], [address] Phone: [phone]   Fax:  [phone]  Physical Therapy Treatment  Patient Details  Name: Gary [name] MRN: [id number] Date of Birth: [birth year] Referring Provider: Ronnie Derby   Encounter Date: 09/28/2017  PT End of Session - 09/28/17 1012    Visit Number  6    Date for PT Re-Evaluation  11/10/17    PT Start Time  0930    PT Stop Time  1028    PT Time Calculation (min)  58 min    Activity Tolerance  Patient tolerated treatment well    Behavior During Therapy  [facility] for tasks assessed/performed       Past Medical History:  Diagnosis Date  . Adenomatous colon polyp   . ALLERGIC RHINITIS 10/03/2007  . Arthritis   . Atrial flutter ([place]) 06/26/2014  . Cancer ([place])    skin cancers removed in the past basal cell and squamous cell  . CARDIAC MURMUR, AORTIC 10/03/2007  . ERECTILE DYSFUNCTION 10/03/2007  . High blood pressure 03/2016  . HYPERLIPIDEMIA 10/03/2007  . Rosacea   . Sleep apnea     Past Surgical History:  Procedure Laterality Date  . ADENOIDECTOMY    . CARDIOVERSION N/A 07/01/2014   Procedure: CARDIOVERSION;  Surgeon: [name], MD;  [facility];  Service: Cardiovascular;  Laterality: N/A;  . COLONOSCOPY    . TEE WITHOUT CARDIOVERSION N/A 07/01/2014   Procedure: TRANSESOPHAGEAL ECHOCARDIOGRAM (TEE);  Surgeon: [name], MD;  Location: [facility] ENDOSCOPY;  Service: Cardiovascular;  Laterality: N/A;  . TONSILLECTOMY    . TOTAL KNEE ARTHROPLASTY Right 08/27/2017  . TOTAL KNEE ARTHROPLASTY Right 08/27/2017   Procedure: RIGHT TOTAL KNEE ARTHROPLASTY;  Surgeon: Vickey Huger, MD;  Location: [place];  Service: Orthopedics;  Laterality: Right;    There were no vitals filed for this visit.  Subjective Assessment - 09/28/17 0938    Subjective  Patient reports that he is doing well. Having some pain in the scar area    Currently in Pain?  Yes    Pain Score  2      Pain Location  Knee    Pain Orientation  Right                       [facility] Adult PT Treatment/Exercise - 09/28/17 0001      Ambulation/Gait   Gait Comments  around building some mild deficits going down the hill      Knee/Hip Exercises: Aerobic   Elliptical  R=6 I=10 x 3 minutes    Nustep  L 5 x 6 min      Knee/Hip Exercises: Machines for Strengthening   Cybex Knee Extension  5# 2x10 cues for TKE a set of 10 with the right only    Cybex Knee Flexion  25# 2x15, then 15# right only    Cybex Leg Press  40$ 2x10 then right only with 20#      Knee/Hip Exercises: Supine   Quad Sets  Right;2 sets;10 reps    Short Arc Quad Sets  Right;2 sets;10 reps;Limitations    Short Arc Quad Sets Limitations  3#      Manual Therapy   Soft tissue mobilization  scar mobilization    Passive ROM  flex/ext               PT Short Term Goals - 09/11/17 1353      PT  SHORT TERM GOAL #1   Title  independent with initial HEP    Status  Achieved        PT Long Term Goals - 09/26/17 1616      PT LONG TERM GOAL #2   Title  go up and down stairs step over step    Status  Partially Met      PT LONG TERM GOAL #5   Title  report no difficulty with sleeping    Status  Partially Met            Plan - 09/28/17 1012    Clinical Impression Statement  Patient with mild quad lag, tends to not extend the knee with walking.  He is doing well with his home exercises    PT Next Visit Plan  we will continue to work on strength and function, could try tape for swelling    Consulted and Agree with Plan of Care  Patient       Patient will benefit from skilled therapeutic intervention in order to improve the following deficits and impairments:  Abnormal gait, Decreased range of motion, Difficulty walking, Decreased activity tolerance, Pain, Impaired flexibility, Decreased scar mobility, Decreased mobility, Decreased strength, Increased edema  Visit Diagnosis: Acute pain of  right knee  Stiffness of right knee, not elsewhere classified  Localized edema  Difficulty in walking, not elsewhere classified     Problem List Patient Active Problem List   Diagnosis Date Noted  . S/P total knee replacement 08/27/2017  . Essential hypertension 04/05/2016  . Routine general medical examination at a health care facility 05/31/2015  . Obstructive sleep apnea 01/01/2015  . Periodic limb movement 01/01/2015  . Atrial flutter, unspecified   . Typical atrial flutter ([place])   . Atrial flutter ([place]) 06/26/2014  . Cardiac arrhythmia 05/14/2014  . BPH associated with nocturia 05/12/2014  . Calculus of parotid gland 09/02/2013  . Murmur, heart 01/22/2012  . Aortic stenosis 12/20/2010  . LIBIDO, DECREASED 10/19/2009  . Hyperlipidemia 10/03/2007  . ERECTILE DYSFUNCTION 10/03/2007  . ALLERGIC RHINITIS 10/03/2007    Sumner Boast., PT 09/28/2017, 10:14 AM  [address] [place], [place], [address] Phone: [phone]   Fax:  [phone]  Name: Gary [name] MRN: [id number] Date of Birth: 1935-06-20

## 2017-10-01 ENCOUNTER — Ambulatory Visit: Payer: Medicare HMO | Attending: Orthopedic Surgery | Admitting: Physical Therapy

## 2017-10-01 ENCOUNTER — Encounter: Payer: Self-pay | Admitting: Physical Therapy

## 2017-10-01 DIAGNOSIS — R600 Localized edema: Secondary | ICD-10-CM | POA: Diagnosis not present

## 2017-10-01 DIAGNOSIS — R262 Difficulty in walking, not elsewhere classified: Secondary | ICD-10-CM | POA: Insufficient documentation

## 2017-10-01 DIAGNOSIS — M25661 Stiffness of right knee, not elsewhere classified: Secondary | ICD-10-CM | POA: Diagnosis not present

## 2017-10-01 DIAGNOSIS — M25561 Pain in right knee: Secondary | ICD-10-CM | POA: Diagnosis not present

## 2017-10-01 NOTE — Therapy (Signed)
[place], [place], [address] Phone: [phone]   Fax:  [phone]  Physical Therapy Treatment  Patient Details  Name: Gary [name] MRN: [id number] Date of Birth: 07-Mar-1936 Referring Provider: Ronnie Derby   Encounter Date: 10/01/2017  PT End of Session - 10/01/17 1140    Visit Number  7    Date for PT Re-Evaluation  11/10/17    PT Start Time  1103    PT Stop Time  1150    PT Time Calculation (min)  47 min    Activity Tolerance  Patient tolerated treatment well    Behavior During Therapy  [facility] for tasks assessed/performed       Past Medical History:  Diagnosis Date  . Adenomatous colon polyp   . ALLERGIC RHINITIS 10/03/2007  . Arthritis   . Atrial flutter ([place]) 06/26/2014  . Cancer ([place])    skin cancers removed in the past basal cell and squamous cell  . CARDIAC MURMUR, AORTIC 10/03/2007  . ERECTILE DYSFUNCTION 10/03/2007  . High blood pressure 03/2016  . HYPERLIPIDEMIA 10/03/2007  . Rosacea   . Sleep apnea     Past Surgical History:  Procedure Laterality Date  . ADENOIDECTOMY    . CARDIOVERSION N/A 07/01/2014   Procedure: CARDIOVERSION;  Surgeon: [name], MD;  [facility];  Service: Cardiovascular;  Laterality: N/A;  . COLONOSCOPY    . TEE WITHOUT CARDIOVERSION N/A 07/01/2014   Procedure: TRANSESOPHAGEAL ECHOCARDIOGRAM (TEE);  Surgeon: [name], MD;  Location: [facility] ENDOSCOPY;  Service: Cardiovascular;  Laterality: N/A;  . TONSILLECTOMY    . TOTAL KNEE ARTHROPLASTY Right 08/27/2017  . TOTAL KNEE ARTHROPLASTY Right 08/27/2017   Procedure: RIGHT TOTAL KNEE ARTHROPLASTY;  Surgeon: Vickey Huger, MD;  Location: [place];  Service: Orthopedics;  Laterality: Right;    There were no vitals filed for this visit.  Subjective Assessment - 10/01/17 1107    Subjective  Patietn reports a good weekend, reports "still swelling"    Currently in Pain?  No/denies         [facility] PT  Assessment - 10/01/17 0001      AROM   Right Knee Extension  5    Right Knee Flexion  119                   [facility] Adult PT Treatment/Exercise - 10/01/17 0001      Ambulation/Gait   Gait Comments  gait up and down stairs step over step, more difficulty with going up than down, then outside around building uneven surfaces      Knee/Hip Exercises: Aerobic   Elliptical  R=6 I=10 x 4 minutes    Recumbent Bike  6 mins level 1      Knee/Hip Exercises: Machines for Strengthening   Cybex Knee Extension  5# 2x10 right only cues for TKE    Cybex Knee Flexion  20# right only 2x10    Cybex Leg Press  20# right only      Vasopneumatic   Number Minutes Vasopneumatic   15 minutes    Vasopnuematic Location   Knee    Vasopneumatic Pressure  Medium    Vasopneumatic Temperature   34      Manual Therapy   Soft tissue mobilization  scar mobilization    Passive ROM  flex/ext               PT Short Term Goals - 09/11/17 1353  PT SHORT TERM GOAL #1   Title  independent with initial HEP    Status  Achieved        PT Long Term Goals - 10/01/17 1143      PT LONG TERM GOAL #1   Title  independent with RICE    Status  Achieved      PT LONG TERM GOAL #2   Title  go up and down stairs step over step    Status  Achieved      PT LONG TERM GOAL #3   Title  increase AROM to 5-120 degrees flexion    Status  Partially Met      PT LONG TERM GOAL #4   Title  decrease pain 50%    Status  Achieved      PT LONG TERM GOAL #5   Title  report no difficulty with sleeping    Status  Achieved            Plan - 10/01/17 1141    Clinical Impression Statement  Patient overall is doing very well, has a mild quad lag that could be due to the swelling.  HE is able to easily descend stairs step over step, has a little more difficulty going up having to pull on hand rail when pushing with the right LE.  I spoke with him some about his goal to resume golf.    PT Next Visit Plan   we could continue to address swelling and strength/function    Consulted and Agree with Plan of Care  Patient       Patient will benefit from skilled therapeutic intervention in order to improve the following deficits and impairments:  Abnormal gait, Decreased range of motion, Difficulty walking, Decreased activity tolerance, Pain, Impaired flexibility, Decreased scar mobility, Decreased mobility, Decreased strength, Increased edema  Visit Diagnosis: Acute pain of right knee  Stiffness of right knee, not elsewhere classified  Localized edema  Difficulty in walking, not elsewhere classified     Problem List Patient Active Problem List   Diagnosis Date Noted  . S/P total knee replacement 08/27/2017  . Essential hypertension 04/05/2016  . Routine general medical examination at a health care facility 05/31/2015  . Obstructive sleep apnea 01/01/2015  . Periodic limb movement 01/01/2015  . Atrial flutter, unspecified   . Typical atrial flutter ([place])   . Atrial flutter ([place]) 06/26/2014  . Cardiac arrhythmia 05/14/2014  . BPH associated with nocturia 05/12/2014  . Calculus of parotid gland 09/02/2013  . Murmur, heart 01/22/2012  . Aortic stenosis 12/20/2010  . LIBIDO, DECREASED 10/19/2009  . Hyperlipidemia 10/03/2007  . ERECTILE DYSFUNCTION 10/03/2007  . ALLERGIC RHINITIS 10/03/2007    Sumner Boast., PT 10/01/2017, 11:44 AM  [address] [place], [place], [address] Phone: [phone]   Fax:  [phone]  Name: Gary [name] MRN: [id number] Date of Birth: 1935-10-23

## 2017-10-02 ENCOUNTER — Encounter: Payer: Self-pay | Admitting: Gastroenterology

## 2017-10-03 ENCOUNTER — Encounter: Payer: Self-pay | Admitting: Physical Therapy

## 2017-10-03 ENCOUNTER — Ambulatory Visit: Payer: Medicare HMO | Admitting: Physical Therapy

## 2017-10-03 DIAGNOSIS — M25561 Pain in right knee: Secondary | ICD-10-CM

## 2017-10-03 DIAGNOSIS — R600 Localized edema: Secondary | ICD-10-CM | POA: Diagnosis not present

## 2017-10-03 DIAGNOSIS — R262 Difficulty in walking, not elsewhere classified: Secondary | ICD-10-CM

## 2017-10-03 DIAGNOSIS — M25661 Stiffness of right knee, not elsewhere classified: Secondary | ICD-10-CM

## 2017-10-03 NOTE — Therapy (Signed)
[place], [place], [address] Phone: [phone]   Fax:  [phone]  Physical Therapy Treatment  Patient Details  Name: Gary [name] MRN: [id number] Date of Birth: 05-20-1935 Referring Provider: Ronnie Derby   Encounter Date: 10/03/2017  PT End of Session - 10/03/17 1011    Visit Number  8    Date for PT Re-Evaluation  11/10/17    PT Start Time  0930    PT Stop Time  1025    PT Time Calculation (min)  55 min    Activity Tolerance  Patient tolerated treatment well    Behavior During Therapy  [facility] for tasks assessed/performed       Past Medical History:  Diagnosis Date  . Adenomatous colon polyp   . ALLERGIC RHINITIS 10/03/2007  . Arthritis   . Atrial flutter ([place]) 06/26/2014  . Cancer ([place])    skin cancers removed in the past basal cell and squamous cell  . CARDIAC MURMUR, AORTIC 10/03/2007  . ERECTILE DYSFUNCTION 10/03/2007  . High blood pressure 03/2016  . HYPERLIPIDEMIA 10/03/2007  . Rosacea   . Sleep apnea     Past Surgical History:  Procedure Laterality Date  . ADENOIDECTOMY    . CARDIOVERSION N/A 07/01/2014   Procedure: CARDIOVERSION;  Surgeon: [name], MD;  [facility] ENDOS[facility];  Service: Cardiovascular;  Laterality: N/A;  . COLONOSCOPY    . TEE WITHOUT CARDIOVERSION N/A 07/01/2014   Procedure: TRANSESOPHAGEAL ECHOCARDIOGRAM (TEE);  Surgeon: [name], MD;  Location: [facility] ENDOSCOPY;  Service: Cardiovascular;  Laterality: N/A;  . TONSILLECTOMY    . TOTAL KNEE ARTHROPLASTY Right 08/27/2017  . TOTAL KNEE ARTHROPLASTY Right 08/27/2017   Procedure: RIGHT TOTAL KNEE ARTHROPLASTY;  Surgeon: Vickey Huger, MD;  Location: [place];  Service: Orthopedics;  Laterality: Right;    There were no vitals filed for this visit.  Subjective Assessment - 10/03/17 0945    Subjective  MD felt like he was doing great and released him    Currently in Pain?  No/denies                        [facility] Adult PT Treatment/Exercise - 10/03/17 0001      Knee/Hip Exercises: Aerobic   Elliptical  R=6 I=10 x 5 minutes    Recumbent Bike  6 mins level 1      Knee/Hip Exercises: Machines for Strengthening   Cybex Knee Extension  5# 2x10 right only cues for TKE    Cybex Knee Flexion  20# right only 2x10    Cybex Leg Press  20# right only tried 30# had to do sets of 5    Other Machine  right leg SLS dead lift 6#      Knee/Hip Exercises: Supine   Quad Sets  Right;2 sets;10 reps    Short Arc Quad Sets  Right;2 sets;10 reps;Limitations    Short Arc Quad Sets Limitations  4#      Vasopneumatic   Number Minutes Vasopneumatic   15 minutes    Vasopnuematic Location   Knee    Vasopneumatic Pressure  Medium    Vasopneumatic Temperature   34               PT Short Term Goals - 09/11/17 1353      PT SHORT TERM GOAL #1   Title  independent with initial HEP    Status  Achieved  PT Long Term Goals - 10/03/17 1012      PT LONG TERM GOAL #3   Title  increase AROM to 5-120 degrees flexion    Status  Achieved      PT LONG TERM GOAL #4   Title  decrease pain 50%    Status  Achieved            Plan - 10/03/17 1011    Clinical Impression Statement  I wrote up a plan for him to return to trainer, went over continued use of RICE, how to slowly progress his cardio and a safe return to golf.  He asked a few questions and feels good about being discharged    PT Next Visit Plan  D/C all goals met    Consulted and Agree with Plan of Care  Patient       Patient will benefit from skilled therapeutic intervention in order to improve the following deficits and impairments:  Abnormal gait, Decreased range of motion, Difficulty walking, Decreased activity tolerance, Pain, Impaired flexibility, Decreased scar mobility, Decreased mobility, Decreased strength, Increased edema  Visit Diagnosis: Acute pain of right knee  Stiffness of right knee,  not elsewhere classified  Localized edema  Difficulty in walking, not elsewhere classified     Problem List Patient Active Problem List   Diagnosis Date Noted  . S/P total knee replacement 08/27/2017  . Essential hypertension 04/05/2016  . Routine general medical examination at a health care facility 05/31/2015  . Obstructive sleep apnea 01/01/2015  . Periodic limb movement 01/01/2015  . Atrial flutter, unspecified   . Typical atrial flutter ([place])   . Atrial flutter ([place]) 06/26/2014  . Cardiac arrhythmia 05/14/2014  . BPH associated with nocturia 05/12/2014  . Calculus of parotid gland 09/02/2013  . Murmur, heart 01/22/2012  . Aortic stenosis 12/20/2010  . LIBIDO, DECREASED 10/19/2009  . Hyperlipidemia 10/03/2007  . ERECTILE DYSFUNCTION 10/03/2007  . ALLERGIC RHINITIS 10/03/2007    Sumner Boast., PT 10/03/2017, 10:13 AM  [address] [place], [place], [address] Phone: [phone]   Fax:  [phone]  Name: Gary [name] MRN: [id number] Date of Birth: January 05, 1936

## 2017-10-04 ENCOUNTER — Other Ambulatory Visit: Payer: Self-pay | Admitting: Cardiovascular Disease

## 2017-10-04 NOTE — Telephone Encounter (Signed)
Rx request sent to pharmacy.  

## 2017-10-10 ENCOUNTER — Encounter: Payer: Medicare HMO | Admitting: Physical Therapy

## 2017-10-12 ENCOUNTER — Encounter: Payer: Medicare HMO | Admitting: Physical Therapy

## 2017-10-15 ENCOUNTER — Encounter: Payer: Medicare HMO | Admitting: Physical Therapy

## 2017-10-16 DIAGNOSIS — Z85828 Personal history of other malignant neoplasm of skin: Secondary | ICD-10-CM | POA: Diagnosis not present

## 2017-10-16 DIAGNOSIS — L821 Other seborrheic keratosis: Secondary | ICD-10-CM | POA: Diagnosis not present

## 2017-10-16 DIAGNOSIS — L812 Freckles: Secondary | ICD-10-CM | POA: Diagnosis not present

## 2017-10-16 DIAGNOSIS — L570 Actinic keratosis: Secondary | ICD-10-CM | POA: Diagnosis not present

## 2017-10-17 ENCOUNTER — Encounter: Payer: Medicare HMO | Admitting: Physical Therapy

## 2017-11-22 DIAGNOSIS — H35031 Hypertensive retinopathy, right eye: Secondary | ICD-10-CM | POA: Diagnosis not present

## 2017-11-22 DIAGNOSIS — H35371 Puckering of macula, right eye: Secondary | ICD-10-CM | POA: Diagnosis not present

## 2017-11-22 DIAGNOSIS — H43813 Vitreous degeneration, bilateral: Secondary | ICD-10-CM | POA: Diagnosis not present

## 2017-11-22 DIAGNOSIS — H34832 Tributary (branch) retinal vein occlusion, left eye, with macular edema: Secondary | ICD-10-CM | POA: Diagnosis not present

## 2017-12-20 ENCOUNTER — Encounter: Payer: Self-pay | Admitting: Gastroenterology

## 2017-12-20 ENCOUNTER — Ambulatory Visit: Payer: Medicare HMO | Admitting: Gastroenterology

## 2017-12-20 ENCOUNTER — Telehealth: Payer: Self-pay

## 2017-12-20 VITALS — BP 150/70 | HR 60 | Ht 75.0 in | Wt 259.6 lb

## 2017-12-20 DIAGNOSIS — Z86010 Personal history of colonic polyps: Secondary | ICD-10-CM

## 2017-12-20 DIAGNOSIS — Z800 Family history of malignant neoplasm of digestive organs: Secondary | ICD-10-CM

## 2017-12-20 DIAGNOSIS — Z7901 Long term (current) use of anticoagulants: Secondary | ICD-10-CM

## 2017-12-20 MED ORDER — PEG-KCL-NACL-NASULF-NA ASC-C 140 G PO SOLR: 1.0000 | Freq: Once | ORAL | 0 refills | Status: AC

## 2017-12-20 NOTE — Progress Notes (Signed)
History of Present Illness: This is a 82 year old self referred for the evaluation of personal history of adenomatous colon polyp and family history of colon cancer.  His last colonoscopy was performed in October 2012 with findings of mild left colon diverticulosis and internal hemorrhoids.  He is maintained on Eliquis for atrial flutter.  No gastrointestinal complaints. Denies weight loss, abdominal pain, constipation, diarrhea, change in stool caliber, melena, hematochezia, nausea, vomiting, dysphagia, reflux symptoms, chest pain.       No Known Allergies Outpatient Medications Prior to Visit  Medication Sig Dispense Refill  . apixaban (ELIQUIS) 5 MG TABS tablet Take 1 tablet (5 mg total) by mouth 2 (two) times daily. 180 tablet 1  . co-enzyme Q-10 30 MG capsule Take 30 mg by mouth every other day.     . fluticasone (FLONASE) 50 MCG/ACT nasal spray Place 1 spray into both nostrils every other day. 32 g 5  . lisinopril (PRINIVIL,ZESTRIL) 5 MG tablet Take 1 tablet (5 mg total) by mouth daily. 100 tablet 4  . methocarbamol (ROBAXIN) 500 MG tablet Take 1-2 tablets (500-1,000 mg total) by mouth every 6 (six) hours as needed for muscle spasms. 60 tablet 0  . Multiple Vitamin (MULTIVITAMIN) tablet Take 1 tablet by mouth daily.    . Oxycodone HCl 10 MG TABS Take 1 tablet (10 mg total) by mouth every 4 (four) hours as needed (pain score 4-6). 40 tablet 0  . simvastatin (ZOCOR) 20 MG tablet Take 1 tablet (20 mg total) by mouth at bedtime. (Patient taking differently: Take 20 mg by mouth every other day. ) 90 tablet 4   No facility-administered medications prior to visit.    Past Medical History:  Diagnosis Date  . Adenomatous colon polyp 06/2000  . ALLERGIC RHINITIS 10/03/2007  . Arthritis   . Atrial flutter ([place]) 06/26/2014  . Cancer ([place])    skin cancers removed in the past basal cell and squamous cell  . CARDIAC MURMUR, AORTIC 10/03/2007  . ERECTILE DYSFUNCTION 10/03/2007  . High blood  pressure 03/2016  . HYPERLIPIDEMIA 10/03/2007  . Rosacea   . Sleep apnea    Past Surgical History:  Procedure Laterality Date  . ADENOIDECTOMY    . CARDIOVERSION N/A 07/01/2014   Procedure: CARDIOVERSION;  Surgeon: [name], MD;  [facility];  Service: Cardiovascular;  Laterality: N/A;  . COLONOSCOPY    . TEE WITHOUT CARDIOVERSION N/A 07/01/2014   Procedure: TRANSESOPHAGEAL ECHOCARDIOGRAM (TEE);  Surgeon: [name], MD;  Location: [facility] ENDOSCOPY;  Service: Cardiovascular;  Laterality: N/A;  . TONSILLECTOMY    . TOTAL KNEE ARTHROPLASTY Right 08/27/2017  . TOTAL KNEE ARTHROPLASTY Right 08/27/2017   Procedure: RIGHT TOTAL KNEE ARTHROPLASTY;  Surgeon: Vickey Huger, MD;  Location: [place];  Service: Orthopedics;  Laterality: Right;   Social History   Socioeconomic History  . Marital status: Married    Spouse name: Not on file  . Number of children: 2  . Years of education: Not on file  . Highest education level: Not on file  Occupational History  . Occupation: Retired  Scientific laboratory technician  . Financial resource strain: Not on file  . Food insecurity:    Worry: Not on file    Inability: Not on file  . Transportation needs:    Medical: Not on file    Non-medical: Not on file  Tobacco Use  . Smoking status: Former Research scientist (life sciences)  . Smokeless tobacco: Never Used  Substance and Sexual Activity  . Alcohol  use: Yes    Alcohol/week: 8.0 standard drinks    Types: 8 Glasses of wine per week    Comment: ocassionally   . Drug use: No  . Sexual activity: Not on file  Lifestyle  . Physical activity:    Days per week: Not on file    Minutes per session: Not on file  . Stress: Not on file  Relationships  . Social connections:    Talks on phone: Not on file    Gets together: Not on file    Attends religious service: Not on file    Active member of club or organization: Not on file    Attends meetings of clubs or organizations: Not on file    Relationship status: Not on file  Other  Topics Concern  . Not on file  Social History Narrative  . Not on file   Family History  Problem Relation Age of Onset  . Diabetes Mother   . Cancer Father        liver ca  . Liver cancer Father       Review of Systems: Pertinent positive and negative review of systems were noted in the above HPI section. All other review of systems were otherwise negative.    Physical Exam: General: Well developed, well nourished, no acute distress Head: Normocephalic and atraumatic Eyes:  sclerae anicteric, EOMI Ears: Normal auditory acuity Mouth: No deformity or lesions Neck: Supple, no masses or thyromegaly Lungs: Clear throughout to auscultation Heart: Regular rate and rhythm; no murmurs, rubs or bruits Abdomen: Soft, non tender and non distended. No masses, hepatosplenomegaly or hernias noted. Normal Bowel sounds Rectal: Deferred to colonoscopy Musculoskeletal: Symmetrical with no gross deformities  Skin: No lesions on visible extremities Pulses:  Normal pulses noted Extremities: No clubbing, cyanosis, edema or deformities noted Neurological: Alert oriented x 4, grossly nonfocal Cervical Nodes:  No significant cervical adenopathy Inguinal Nodes: No significant inguinal adenopathy Psychological:  Alert and cooperative. Normal mood and affect  Assessment and Recommendations:  1.  Personal history of adenomatous colon polyps, family history of colon cancer in his father.  Schedule colonoscopy. The risks (including bleeding, perforation, infection, missed lesions, medication reactions and possible hospitalization or surgery if complications occur), benefits, and alternatives to colonoscopy with possible biopsy and possible polypectomy were discussed with the patient and they consent to proceed.   2. Hold Eliquis 2 days before procedure - will instruct when and how to resume after procedure. Low but real risk of cardiovascular event such as heart attack, stroke, embolism, thrombosis or  ischemia/infarct of other organs off Eliquis explained and need to seek urgent help if this occurs. The patient consents to proceed. Will communicate by phone or EMR with patient's prescribing provider to confirm that holding Eliquis is reasonable in this case.

## 2017-12-20 NOTE — Telephone Encounter (Signed)
[facility] Pre-operative Risk Assessment     Request for surgical clearance:     Endoscopy Procedure  What type of surgery is being performed?     Colonoscopy  When is this surgery scheduled?     01/30/18  What type of clearance is required ?   Pharmacy  Are there any medications that need to be held prior to surgery and how long? Elquis 2 days  Practice name and name of physician performing surgery?      [facility]  What is your office phone and fax number?      Phone- [phone]  [phone]  Anesthesia type (None, local, MAC, general) ?       MAC

## 2017-12-20 NOTE — Telephone Encounter (Signed)
Clearance faxed to requesting provider. See pharmacy recs below. I will remove from preop pool.

## 2017-12-20 NOTE — Telephone Encounter (Signed)
Pt takes Eliquis for afib with CHADS2VASc score of 3 (age x2, HTN). CrCl 14mL/min. Ok to hold Eliquis 1-2 days prior to procedure.

## 2017-12-20 NOTE — Telephone Encounter (Signed)
Routing to pharmacy. 

## 2017-12-20 NOTE — Patient Instructions (Signed)
You have been scheduled for a colonoscopy. Please follow written instructions given to you at your visit today.  Please pick up your prep supplies at the pharmacy within the next 1-3 days. If you use inhalers (even only as needed), please bring them with you on the day of your procedure. Your physician has requested that you go to www.startemmi.com and enter the access code given to you at your visit today. This web site gives a general overview about your procedure. However, you should still follow specific instructions given to you by our office regarding your preparation for the procedure.  Thank you for choosing me and [facility].  Malcolm T. Stark, Jr., MD., FACG  

## 2017-12-21 NOTE — Telephone Encounter (Signed)
Informed patient to hold Eliquis 2 days before his procedure and patient verbalized understanding.

## 2017-12-21 NOTE — Telephone Encounter (Signed)
Left a message for patient to return my call. 

## 2018-01-16 ENCOUNTER — Encounter: Payer: Self-pay | Admitting: Gastroenterology

## 2018-01-30 ENCOUNTER — Ambulatory Visit (AMBULATORY_SURGERY_CENTER): Payer: Medicare HMO | Admitting: Gastroenterology

## 2018-01-30 ENCOUNTER — Encounter: Payer: Self-pay | Admitting: Gastroenterology

## 2018-01-30 VITALS — BP 129/71 | HR 63 | Temp 98.9°F | Resp 24 | Ht 75.0 in | Wt 259.0 lb

## 2018-01-30 DIAGNOSIS — Z86010 Personal history of colonic polyps: Secondary | ICD-10-CM

## 2018-01-30 DIAGNOSIS — D120 Benign neoplasm of cecum: Secondary | ICD-10-CM | POA: Diagnosis not present

## 2018-01-30 DIAGNOSIS — D122 Benign neoplasm of ascending colon: Secondary | ICD-10-CM | POA: Diagnosis not present

## 2018-01-30 DIAGNOSIS — D123 Benign neoplasm of transverse colon: Secondary | ICD-10-CM

## 2018-01-30 DIAGNOSIS — D125 Benign neoplasm of sigmoid colon: Secondary | ICD-10-CM

## 2018-01-30 DIAGNOSIS — Z1211 Encounter for screening for malignant neoplasm of colon: Secondary | ICD-10-CM | POA: Diagnosis not present

## 2018-01-30 MED ORDER — SODIUM CHLORIDE 0.9 % IV SOLN: 500.0000 mL | Freq: Once | INTRAVENOUS | Status: DC

## 2018-01-30 NOTE — Progress Notes (Signed)
Called to room to assist during endoscopic procedure.  Patient ID and intended procedure confirmed with present staff. Received instructions for my participation in the procedure from the performing physician.  

## 2018-01-30 NOTE — Patient Instructions (Signed)
YOU HAD AN ENDOSCOPIC PROCEDURE TODAY AT [facility]:   Refer to the procedure report that was given to you for any specific questions about what was found during the examination.  If the procedure report does not answer your questions, please call your gastroenterologist to clarify.  If you requested that your care partner not be given the details of your procedure findings, then the procedure report has been included in a sealed envelope for you to review at your convenience later.  YOU SHOULD EXPECT: Some feelings of bloating in the abdomen. Passage of more gas than usual.  Walking can help get rid of the air that was put into your GI tract during the procedure and reduce the bloating. If you had a lower endoscopy (such as a colonoscopy or flexible sigmoidoscopy) you may notice spotting of blood in your stool or on the toilet paper. If you underwent a bowel prep for your procedure, you may not have a normal bowel movement for a few days.  Please Note:  You might notice some irritation and congestion in your nose or some drainage.  This is from the oxygen used during your procedure.  There is no need for concern and it should clear up in a day or so.  SYMPTOMS TO REPORT IMMEDIATELY:   Following lower endoscopy (colonoscopy or flexible sigmoidoscopy):  Excessive amounts of blood in the stool  Significant tenderness or worsening of abdominal pains  Swelling of the abdomen that is new, acute  Fever of 100F or higher  For urgent or emergent issues, a gastroenterologist can be reached at any hour by calling [phone].   DIET:  We do recommend a small meal at first, but then you may proceed to your regular diet.  Drink plenty of fluids but you should avoid alcoholic beverages for 24 hours.  MEDICATIONS: Continue present medications.Resume Eliquis (apixaban) tomorrow at prior dose.  No Aspirin, Ibuprofen, Naproxen, or other non-steroidal anti-inflammatory drugs for 2 weeks after  polyp removal.  Please see handouts given to you by your recovery nurse.  ACTIVITY:  You should plan to take it easy for the rest of today and you should NOT DRIVE or use heavy machinery until tomorrow (because of the sedation medicines used during the test).    FOLLOW UP: Our staff will call the number listed on your records the next business day following your procedure to check on you and address any questions or concerns that you may have regarding the information given to you following your procedure. If we do not reach you, we will leave a message.  However, if you are feeling well and you are not experiencing any problems, there is no need to return our call.  We will assume that you have returned to your regular daily activities without incident.  If any biopsies were taken you will be contacted by phone or by letter within the next 1-3 weeks.  Please call us at [phone] if you have not heard about the biopsies in 3 weeks.   Thank you for allowing [place] to provide for your healthcare needs today.  SIGNATURES/CONFIDENTIALITY: You and/or your care partner have signed paperwork which will be entered into your electronic medical record.  These signatures attest to the fact that that the information above on your After Visit Summary has been reviewed and is understood.  Full responsibility of the confidentiality of this discharge information lies with you and/or your care-partner.

## 2018-01-30 NOTE — Progress Notes (Signed)
A and O x3. Report to RN. Tolerated MAC anesthesia well.

## 2018-01-30 NOTE — Op Note (Signed)
[name] Procedure Date: 01/30/2018 2:21 PM MRN: [id number] Endoscopist: Ladene Artist , MD Age: 82 Referring MD:  Date of Birth: [birth year] Gender: Male Account #: [id number] Procedure:                Colonoscopy Indications:              Surveillance: Personal history of adenomatous                            polyps on last colonoscopy 5 years ago Medicines:                Monitored Anesthesia Care Procedure:                Pre-Anesthesia Assessment:                           - Prior to the procedure, a History and Physical                            was performed, and patient medications and                            allergies were reviewed. The patient's tolerance of                            previous anesthesia was also reviewed. The risks                            and benefits of the procedure and the sedation                            options and risks were discussed with the patient.                            All questions were answered, and informed consent                            was obtained. Prior Anticoagulants: The patient has                            taken Eliquis (apixaban), last dose was 2 days                            prior to procedure. ASA Grade Assessment: III - A                            patient with severe systemic disease. After                            reviewing the risks and benefits, the patient was                            deemed in satisfactory condition to undergo the  procedure.                           After obtaining informed consent, the colonoscope                            was passed under direct vision. Throughout the                            procedure, the patient's blood pressure, pulse, and                            oxygen saturations were monitored continuously. The                            Colonoscope was introduced through the anus and                 advanced to the the cecum, identified by                            appendiceal orifice and ileocecal valve. The                            ileocecal valve, appendiceal orifice, and rectum                            were photographed. The quality of the bowel                            preparation was good. The colonoscopy was performed                            without difficulty. The patient tolerated the                            procedure well. Scope In: 2:40:22 PM Scope Out: 3:02:38 PM Scope Withdrawal Time: 0 hours 17 minutes 5 seconds  Total Procedure Duration: 0 hours 22 minutes 16 seconds  Findings:                 The perianal and digital rectal examinations were                            normal.                           Six sessile polyps were found in the sigmoid colon                            (2), transverse colon (2), ascending colon (1) and                            cecum (1). The polyps were 6 to 8 mm in size. These  polyps were removed with a cold snare. Resection                            and retrieval were complete.                           A few medium-mouthed diverticula were found in the                            left colon.                           Internal hemorrhoids were found during                            retroflexion. The hemorrhoids were medium-sized and                            Grade I (internal hemorrhoids that do not prolapse).                           The exam was otherwise without abnormality on                            direct and retroflexion views. Complications:            No immediate complications. Estimated blood loss:                            None. Estimated Blood Loss:     Estimated blood loss: none. Impression:               - Six 6 to 8 mm polyps in the sigmoid colon, in the                            transverse colon, in the ascending colon and in the                             cecum, removed with a cold snare. Resected and                            retrieved.                           - Diverticulosis in the left colon.                           - Internal hemorrhoids.                           - The examination was otherwise normal on direct                            and retroflexion views. Recommendation:           - Resume Eliquis (apixaban) tomorrow at prior dose.  Refer to managing physician for further adjustment                            of therapy.                           - Patient has a contact number available for                            emergencies. The signs and symptoms of potential                            delayed complications were discussed with the                            patient. Return to normal activities tomorrow.                            Written discharge instructions were provided to the                            patient.                           - Resume previous diet.                           - Continue present medications.                           - Await pathology results.                           - No aspirin, ibuprofen, naproxen, or other                            non-steroidal anti-inflammatory drugs for 2 weeks                            after polyp removal.                           - No repeat colonoscopy due to age. Ladene Artist, MD 01/30/2018 3:10:05 PM This report has been signed electronically.

## 2018-01-31 ENCOUNTER — Telehealth: Payer: Self-pay | Admitting: *Deleted

## 2018-01-31 ENCOUNTER — Telehealth: Payer: Self-pay

## 2018-01-31 NOTE — Telephone Encounter (Signed)
No answer, left message to call if having any problems, B.Abigaile Rossie RN  

## 2018-01-31 NOTE — Telephone Encounter (Signed)
No answer. Name identifier. Message left to call if questions or concerns and we will attempt to call later in the day.

## 2018-02-01 ENCOUNTER — Ambulatory Visit: Payer: Medicare HMO | Admitting: Cardiovascular Disease

## 2018-02-15 ENCOUNTER — Telehealth: Payer: Self-pay | Admitting: Cardiovascular Disease

## 2018-02-15 NOTE — Telephone Encounter (Signed)
Returned call to patient Eliquis 5 mg samples left at [facility] office front desk. 

## 2018-02-15 NOTE — Telephone Encounter (Signed)
New Message   Patient calling the office for samples of medication:   1.  What medication and dosage are you requesting samples for? apixaban (ELIQUIS) 5 MG TABS tablet  2.  Are you currently out of this medication? Yes for 2 days

## 2018-02-18 ENCOUNTER — Encounter: Payer: Self-pay | Admitting: Gastroenterology

## 2018-02-25 DIAGNOSIS — H34832 Tributary (branch) retinal vein occlusion, left eye, with macular edema: Secondary | ICD-10-CM | POA: Diagnosis not present

## 2018-02-25 DIAGNOSIS — H43813 Vitreous degeneration, bilateral: Secondary | ICD-10-CM | POA: Diagnosis not present

## 2018-02-25 DIAGNOSIS — H35371 Puckering of macula, right eye: Secondary | ICD-10-CM | POA: Diagnosis not present

## 2018-02-25 DIAGNOSIS — H35031 Hypertensive retinopathy, right eye: Secondary | ICD-10-CM | POA: Diagnosis not present

## 2018-03-01 ENCOUNTER — Encounter: Payer: Self-pay | Admitting: Cardiovascular Disease

## 2018-03-01 ENCOUNTER — Ambulatory Visit: Payer: Medicare HMO | Admitting: Cardiovascular Disease

## 2018-03-01 VITALS — BP 134/66 | HR 61 | Ht 75.0 in | Wt 258.0 lb

## 2018-03-01 DIAGNOSIS — G4733 Obstructive sleep apnea (adult) (pediatric): Secondary | ICD-10-CM

## 2018-03-01 DIAGNOSIS — E7800 Pure hypercholesterolemia, unspecified: Secondary | ICD-10-CM

## 2018-03-01 DIAGNOSIS — Z7901 Long term (current) use of anticoagulants: Secondary | ICD-10-CM

## 2018-03-01 DIAGNOSIS — I483 Typical atrial flutter: Secondary | ICD-10-CM | POA: Diagnosis not present

## 2018-03-01 NOTE — Progress Notes (Signed)
Cardiology Office Note    Date:  03/03/2018   ID:  LAVALLE SKODA, DOB 1936/03/02, MRN [id number]  PCP:  Dorena Cookey, MD  Cardiologist:   [name], MD   Chief Complaint  Patient presents with  . Follow-up  Atrial flutter  History of Present Illness:  Gary [name] is a 82 y.o. male with asymptomatic atrial flutter with spontaneously controlled ventricular rate, recurrent following previous cardioversion without significant structural heart disease. He returns for routine follow-up.   He continues to do well.  He exercises regularly with a trainer and plays golf at least twice a week.  He denies any problems with shortness of breath, angina, dizziness or syncope or palpitations at rest or with activity.  He has not had leg edema and denies claudication.  He underwent uncomplicated total right knee replacement about 6 months ago; in August he underwent colonoscopy.  On both occasions he had brief interruption of his anticoagulants, uneventfully.  Previous workup with echocardiography shows aortic valve sclerosis without stenosis and normal left ventricular function. He has a markedly dilated left atrium. He has obstructive sleep apnea and is compliant with CPAP.  Past Medical History:  Diagnosis Date  . Adenomatous colon polyp 06/2000  . ALLERGIC RHINITIS 10/03/2007  . Allergy    seasonal  . Arthritis    knees  . Atrial flutter ([place]) 06/26/2014  . Cancer ([place])    skin cancers removed in the past basal cell and squamous cell  . CARDIAC MURMUR, AORTIC 10/03/2007  . ERECTILE DYSFUNCTION 10/03/2007  . High blood pressure 03/2016  . HYPERLIPIDEMIA 10/03/2007  . Rosacea   . Sleep apnea     Past Surgical History:  Procedure Laterality Date  . ADENOIDECTOMY    . CARDIOVERSION N/A 07/01/2014   Procedure: CARDIOVERSION;  Surgeon: [name], MD;  Location: [facility];  Service: Cardiovascular;  Laterality: N/A;  . COLONOSCOPY    . TEE WITHOUT CARDIOVERSION N/A 07/01/2014     Procedure: TRANSESOPHAGEAL ECHOCARDIOGRAM (TEE);  Surgeon: [name], MD;  Location: [facility];  Service: Cardiovascular;  Laterality: N/A;  . TONSILLECTOMY    . TOTAL KNEE ARTHROPLASTY Right 08/27/2017  . TOTAL KNEE ARTHROPLASTY Right 08/27/2017   Procedure: RIGHT TOTAL KNEE ARTHROPLASTY;  Surgeon: Vickey Huger, MD;  Location: [facility];  Service: Orthopedics;  Laterality: Right;    Current Medications: Outpatient Medications Prior to Visit  Medication Sig Dispense Refill  . apixaban (ELIQUIS) 5 MG TABS tablet Take 1 tablet (5 mg total) by mouth 2 (two) times daily. 180 tablet 1  . co-enzyme Q-10 30 MG capsule Take 30 mg by mouth every other day.     . fluticasone (FLONASE) 50 MCG/ACT nasal spray Place 1 spray into both nostrils every other day. 32 g 5  . lisinopril (PRINIVIL,ZESTRIL) 5 MG tablet Take 1 tablet (5 mg total) by mouth daily. 100 tablet 4  . methocarbamol (ROBAXIN) 500 MG tablet Take 1-2 tablets (500-1,000 mg total) by mouth every 6 (six) hours as needed for muscle spasms. 60 tablet 0  . minocycline (MINOCIN,DYNACIN) 100 MG capsule minocycline 100 mg capsule    . Multiple Vitamin (MULTIVITAMIN) tablet Take 1 tablet by mouth daily.    . Oxycodone HCl 10 MG TABS Take 1 tablet (10 mg total) by mouth every 4 (four) hours as needed (pain score 4-6). 40 tablet 0  . simvastatin (ZOCOR) 20 MG tablet Take 1 tablet (20 mg total) by mouth at bedtime. (Patient taking differently: Take 20 mg  by mouth every other day. ) 90 tablet 4   Facility-Administered Medications Prior to Visit  Medication Dose Route Frequency Provider Last Rate Last Dose  . 0.9 %  sodium chloride infusion  500 mL Intravenous Once Ladene Artist, MD         Allergies:   Patient has no known allergies.   Social History   Socioeconomic History  . Marital status: Married    Spouse name: Not on file  . Number of children: 2  . Years of education: Not on file  . Highest education level: Not on file   Occupational History  . Occupation: Retired  Scientific laboratory technician  . Financial resource strain: Not on file  . Food insecurity:    Worry: Not on file    Inability: Not on file  . Transportation needs:    Medical: Not on file    Non-medical: Not on file  Tobacco Use  . Smoking status: Former Research scientist (life sciences)  . Smokeless tobacco: Never Used  Substance and Sexual Activity  . Alcohol use: Yes    Alcohol/week: 8.0 standard drinks    Types: 8 Glasses of wine per week    Comment: ocassionally   . Drug use: No  . Sexual activity: Not on file  Lifestyle  . Physical activity:    Days per week: Not on file    Minutes per session: Not on file  . Stress: Not on file  Relationships  . Social connections:    Talks on phone: Not on file    Gets together: Not on file    Attends religious service: Not on file    Active member of club or organization: Not on file    Attends meetings of clubs or organizations: Not on file    Relationship status: Not on file  Other Topics Concern  . Not on file  Social History Narrative  . Not on file     Family History:  The patient's family history includes Cancer in his father; Diabetes in his mother; Liver cancer in his father; Stomach cancer in his paternal aunt.   ROS:   Please see the history of present illness.    ROS All other systems reviewed and are negative.   PHYSICAL EXAM:   VS:  BP 134/66 (BP Location: Left Arm, Patient Position: Sitting, Cuff Size: Normal)   Pulse 61   Ht 6\' 3"  (1.905 m)   Wt 258 lb (117 kg)   BMI 32.25 kg/m      General: Alert, oriented x3, no distress, rhinophyma Head: no evidence of trauma, PERRL, EOMI, no exophtalmos or lid lag, no myxedema, no xanthelasma; normal ears, nose and oropharynx Neck: normal jugular venous pulsations and no hepatojugular reflux; brisk carotid pulses without delay and no carotid bruits Chest: clear to auscultation, no signs of consolidation by percussion or palpation, normal fremitus, symmetrical  and full respiratory excursions Cardiovascular: normal position and quality of the apical impulse, regular rhythm, normal first and second heart sounds, no murmurs, rubs or gallops Abdomen: no tenderness or distention, no masses by palpation, no abnormal pulsatility or arterial bruits, normal bowel sounds, no hepatosplenomegaly Extremities: no clubbing, cyanosis or edema; 2+ radial, ulnar and brachial pulses bilaterally; 2+ right femoral, posterior tibial and dorsalis pedis pulses; 2+ left femoral, posterior tibial and dorsalis pedis pulses; no subclavian or femoral bruits Neurological: grossly nonfocal Psych: Normal mood and affect   Wt Readings from Last 3 Encounters:  03/01/18 258 lb (117 kg)  01/30/18 259  lb (117.5 kg)  12/20/17 259 lb 9.6 oz (117.8 kg)      Studies/Labs Reviewed:   EKG:  EKG is ordered today.  The ekg ordered today demonstrates typical counterclockwise atrial flutter with 4: 1 AV block.  QTc interval 428 ms  Recent Labs: 06/26/2017: TSH 2.19 08/16/2017: ALT 22; BUN 32; Creatinine, Ser 1.13; Hemoglobin 15.5; Platelets 135; Potassium 4.6; Sodium 140   Lipid Panel    Component Value Date/Time   CHOL 136 05/24/2015 0828   TRIG 78.0 05/24/2015 0828   HDL 41.30 05/24/2015 0828   CHOLHDL 3 05/24/2015 0828   VLDL 15.6 05/24/2015 0828   LDLCALC 79 05/24/2015 0828    LDLDIRECT  143.5  01/22/2012 0916     ASSESSMENT:    1. Typical atrial flutter ([place])   2. Long term current use of anticoagulant   3. OSA (obstructive sleep apnea)   4. Hypercholesterolemia      PLAN:  In order of problems listed above:  1. AFlutter: This continues to be an asymptomatic and spontaneously rate controlled arrhythmia.  He must have some underlying AV node disease but has not shown evidence of high-grade AV block or symptoms of bradycardia.  In fact negative chronotropic agents should all be avoided.Marland KitchenCHADSVasc 3 (age, HTN).  Appropriately anticoagulated 2. Eliquis:  Well-tolerated, no bleeding problems.  No history of embolic events. 3. OSA: Compliant with CPAP. Sees Dr. Claiborne Billings later this month.  Is not have daytime hypersomnolence 4. HLP: Satisfactory lipid profile on statin therapy. Does not have known coronary or peripheral vascular lesions.    Medication Adjustments/Labs and Tests Ordered: Current medicines are reviewed at length with the patient today.  Concerns regarding medicines are outlined above.  Medication changes, Labs and Tests ordered today are listed in the Patient Instructions below. Patient Instructions  Medication Instructions:  Dr Sallyanne Kuster recommends that you continue on your current medications as directed. Please refer to the Current Medication list given to you today.  If you need a refill on your cardiac medications before your next appointment, please call your pharmacy.   Follow-Up: At [facility], you and your health needs are our priority.  As part of our continuing mission to provide you with exceptional heart care, we have created designated Provider Care Teams.  These Care Teams include your primary Cardiologist (physician) and Advanced Practice Providers (APPs -  Physician Assistants and Nurse Practitioners) who all work together to provide you with the care you need, when you need it. You will need a follow up appointment in 12 months.  Please call our office 2 months in advance to schedule this appointment.  You may see [name], MD or one of the following Advanced Practice Providers on your designated Care Team: [place], [place] . Fabian Sharp, PA-C    Signed, [name], MD  03/03/2018 1:33 PM    [facility] [address], [place], [place]  [id number] Phone: [phone]; Fax: [phone]

## 2018-03-01 NOTE — Patient Instructions (Signed)
Medication Instructions:  Dr Croitoru recommends that you continue on your current medications as directed. Please refer to the Current Medication list given to you today.  If you need a refill on your cardiac medications before your next appointment, please call your pharmacy.   Follow-Up: At [facility], you and your health needs are our priority.  As part of our continuing mission to provide you with exceptional heart care, we have created designated Provider Care Teams.  These Care Teams include your primary Cardiologist (physician) and Advanced Practice Providers (APPs -  Physician Assistants and Nurse Practitioners) who all work together to provide you with the care you need, when you need it. You will need a follow up appointment in 12 months.  Please call our office 2 months in advance to schedule this appointment.  You may see Mihai Croitoru, MD or one of the following Advanced Practice Providers on your designated Care Team: Hao Meng, PA-C . Rein Popov Duke, PA-C 

## 2018-03-19 ENCOUNTER — Ambulatory Visit: Payer: Medicare HMO | Admitting: Cardiovascular Disease

## 2018-03-19 ENCOUNTER — Encounter: Payer: Self-pay | Admitting: Cardiovascular Disease

## 2018-03-19 VITALS — BP 158/81 | HR 56 | Ht 75.0 in | Wt 261.4 lb

## 2018-03-19 DIAGNOSIS — E7800 Pure hypercholesterolemia, unspecified: Secondary | ICD-10-CM

## 2018-03-19 DIAGNOSIS — G4761 Periodic limb movement disorder: Secondary | ICD-10-CM | POA: Diagnosis not present

## 2018-03-19 DIAGNOSIS — Z7901 Long term (current) use of anticoagulants: Secondary | ICD-10-CM | POA: Diagnosis not present

## 2018-03-19 DIAGNOSIS — G4733 Obstructive sleep apnea (adult) (pediatric): Secondary | ICD-10-CM

## 2018-03-19 DIAGNOSIS — I483 Typical atrial flutter: Secondary | ICD-10-CM | POA: Diagnosis not present

## 2018-03-19 NOTE — Patient Instructions (Signed)
Medication Instructions:  Your physician recommends that you continue on your current medications as directed. Please refer to the Current Medication list given to you today.  If you need a refill on your cardiac medications before your next appointment, please call your pharmacy.   Follow-Up: At [facility], you and your health needs are our priority.  As part of our continuing mission to provide you with exceptional heart care, we have created designated Provider Care Teams.  These Care Teams include your primary Cardiologist (physician) and Advanced Practice Providers (APPs -  Physician Assistants and Nurse Practitioners) who all work together to provide you with the care you need, when you need it. . 1 year with Dr. Claiborne Billings (sleep clinic)   We will send orders to [facility] for a new mask

## 2018-03-19 NOTE — Progress Notes (Signed)
Patient ID: Gary [name], male   DOB: Oct 25, 1935, 82 y.o.   MRN: [id number]      HPI: Gary [name], is a 82 y.o. male who presents to sleep clinic for 13 month follow-up sleep clinic evaluation of his obstructive sleep apnea and CPAP therapy.  Gary [name] is a 82 year old male who is a patient of Dr. Sallyanne Kuster.  He is status post cardioversion for atrial flutter, and has a history of hyperlipidemia and aortic valve sclerosis.  He was referred for a sleep study which was done in May 2016 which revealed moderate obstructive sleep apnea with an AHI of 15.9 overall and 20.5 per hour during REM sleep.  He had reduced sleep efficiency at 54.8%.  Latency to sleep onset was prolonged at 36 minutes.  There was mild snoring.  Oxygen desaturated to 89%.  He underwent a CPAP titration trial and adnexal response to CPAP therapy with the initial recommended CPAP pressure 9 cm water pressure.  Due to oral venting, a nasal pillow mask with chinstrap was recommended.  Since initiating CPAP therapy, he has felt well.  He typically goes to bed at 10:30 and wakes up the proximal he 7:30 AM.  Last year, a download was obtained from 11/22/2014 through 12/21/2014.  Compliance was excellent with 100% days used and 100% of days used greater than 4 hours.  He is averaging 7 hours and 30 minutes of sleep per night.  On his 48 m water pressure, AHI is excellent at 0.8.  There is no significant mask leak and he has a ResMed AirFit P10 medium size mask.  A download was obtained from 11/28/2015 through 12/27/2015.  This confirms 100% compliance both with usage stays and greater than 4 hours of use.  He is averaging 7 hours and 22 minutes of use per night.  He is set at a 9 cm water pressure.  AHI remains excellent at 1.2 per hour.  He does have increased leak with 95th percent average at 30.0.  Despite this mild leak newborn nasal pillows.  His AHI remains excellent and undoubtedly is probably slightly less than its current  reading.  He denied residual snoring.  Sleep is restorative.  He denied daytime sleepiness or bruxism.[name]   He was found to have increased periodic limb movements on his initial sleep study and had increased PLMS index of 19.7, but denies painful restless legs or the "urge to move."  Epworth Sleepiness Scale: Situation   Chance of Dozing/Sleeping (0 = never , 1 = slight chance , 2 = moderate chance , 3 = high chance )   sitting and reading 1   watching TV 1   sitting inactive in a public place 0   being a passenger in a motor vehicle for an hour or more 1   lying down in the afternoon 2   sitting and talking to someone 0   sitting quietly after lunch (no alcohol) 0   while stopped for a few minutes in traffic as the driver 0   Total Score  5   I last saw him in October 2018 at which time he continued to have 100% compliance was CPAP therapy.  He was averaging 6 hours and 26 minutes per night of use.  At a set pressure of 9 cm, AHI was excellent at 0.7.  He has a Special educational needs teacher P42mdium-size with chinstrap mask.  There was minimal leak intermittently.  An Epworth Sleepiness Scale score was recalculated and this endorsed at  4.   Over the past year, he has continued to do well with reference to his sleep apnea.  A new download was obtained in the office today from October 11 through March 09, 2018.  He continues to be compliant and is averaging 7 hours and 36 minutes of CPAP use per night.  AHI remains excellent at 0.7.  At times he has had some irritation from his mask.  His sleep is restorative.  He is unaware of breakthrough snoring.  He continues to have atrial flutter which is chronic and is followed by Dr. Sallyanne Kuster.  He presents for evaluation  Past Medical History:  Diagnosis Date  . Adenomatous colon polyp 06/2000  . ALLERGIC RHINITIS 10/03/2007  . Allergy    seasonal  . Arthritis    knees  . Atrial flutter ([place]) 06/26/2014  . Cancer ([place])    skin cancers removed in the past basal  cell and squamous cell  . CARDIAC MURMUR, AORTIC 10/03/2007  . ERECTILE DYSFUNCTION 10/03/2007  . High blood pressure 03/2016  . HYPERLIPIDEMIA 10/03/2007  . Rosacea   . Sleep apnea     Past Surgical History:  Procedure Laterality Date  . ADENOIDECTOMY    . CARDIOVERSION N/A 07/01/2014   Procedure: CARDIOVERSION;  Surgeon: [name], MD;  Location: [facility] END[facility];  Service: Cardiovascular;  Laterality: N/A;  . COLONOSCOPY    . TEE WITHOUT CARDIOVERSION N/A 07/01/2014   Procedure: TRANSESOPHAGEAL ECHOCARDIOGRAM (TEE);  Surgeon: [name], MD;  Location: [facility] ENDOSCOPY;  Service: Cardiovascular;  Laterality: N/A;  . TONSILLECTOMY    . TOTAL KNEE ARTHROPLASTY Right 08/27/2017  . TOTAL KNEE ARTHROPLASTY Right 08/27/2017   Procedure: RIGHT TOTAL KNEE ARTHROPLASTY;  Surgeon: Vickey Huger, MD;  Location: [place];  Service: Orthopedics;  Laterality: Right;    No Known Allergies  Current Outpatient Medications  Medication Sig Dispense Refill  . apixaban (ELIQUIS) 5 MG TABS tablet Take 1 tablet (5 mg total) by mouth 2 (two) times daily. 180 tablet 1  . co-enzyme Q-10 30 MG capsule Take 30 mg by mouth every other day.     . fluticasone (FLONASE) 50 MCG/ACT nasal spray Place 1 spray into both nostrils every other day. 32 g 5  . lisinopril (PRINIVIL,ZESTRIL) 5 MG tablet Take 1 tablet (5 mg total) by mouth daily. 100 tablet 4  . minocycline (MINOCIN,DYNACIN) 100 MG capsule minocycline 100 mg capsule    . Multiple Vitamin (MULTIVITAMIN) tablet Take 1 tablet by mouth daily.    . simvastatin (ZOCOR) 20 MG tablet Take 1 tablet (20 mg total) by mouth at bedtime. (Patient taking differently: Take 20 mg by mouth every other day. ) 90 tablet 4   Current Facility-Administered Medications  Medication Dose Route Frequency Provider Last Rate Last Dose  . 0.9 %  sodium chloride infusion  500 mL Intravenous Once Ladene Artist, MD        Social History   Socioeconomic History  . Marital status: Married     Spouse name: Not on file  . Number of children: 2  . Years of education: Not on file  . Highest education level: Not on file  Occupational History  . Occupation: Retired  Scientific laboratory technician  . Financial resource strain: Not on file  . Food insecurity:    Worry: Not on file    Inability: Not on file  . Transportation needs:    Medical: Not on file    Non-medical: Not on file  Tobacco Use  . Smoking  status: Former Research scientist (life sciences)  . Smokeless tobacco: Never Used  Substance and Sexual Activity  . Alcohol use: Yes    Alcohol/week: 8.0 standard drinks    Types: 8 Glasses of wine per week    Comment: ocassionally   . Drug use: No  . Sexual activity: Not on file  Lifestyle  . Physical activity:    Days per week: Not on file    Minutes per session: Not on file  . Stress: Not on file  Relationships  . Social connections:    Talks on phone: Not on file    Gets together: Not on file    Attends religious service: Not on file    Active member of club or organization: Not on file    Attends meetings of clubs or organizations: Not on file    Relationship status: Not on file  . Intimate partner violence:    Fear of current or ex partner: Not on file    Emotionally abused: Not on file    Physically abused: Not on file    Forced sexual activity: Not on file  Other Topics Concern  . Not on file  Social History Narrative  . Not on file    Family History  Problem Relation Age of Onset  . Diabetes Mother   . Cancer Father        liver ca  . Liver cancer Father   . Stomach cancer Paternal Aunt      ROS General: Negative; No fevers, chills, or night sweats HEENT: Negative; No changes in vision or hearing, sinus congestion, difficulty swallowing Pulmonary: Negative; No cough, wheezing, shortness of breath, hemoptysis Cardiovascular: History of atrial fibrillation GI: Negative; No nausea, vomiting, diarrhea, or abdominal pain GU: Negative; No dysuria, hematuria, or difficulty  voiding Musculoskeletal: Negative; no myalgias, joint pain, or weakness Hematologic: Negative; no easy bruising, bleeding Endocrine: Negative; no heat/cold intolerance Neuro: Negative; no changes in balance, headaches Skin: Negative; No rashes or skin lesions Psychiatric: Negative; No behavioral problems, depression Sleep: See history of present illness   Physical Exam BP (!) 158/81   Pulse (!) 56   Ht 6' 3"  (1.905 m)   Wt 261 lb 6.4 oz (118.6 kg)   BMI 32.67 kg/m    Repeat blood pressure by me 138/80  Wt Readings from Last 3 Encounters:  03/19/18 261 lb 6.4 oz (118.6 kg)  03/01/18 258 lb (117 kg)  01/30/18 259 lb (117.5 kg)   General: Alert, oriented, no distress.  Skin: normal turgor, no rashes, warm and dry HEENT: Normocephalic, atraumatic. Pupils equal round and reactive to light; sclera anicteric; extraocular muscles intact;  Nose without nasal septal hypertrophy Mouth/Parynx benign; Mallinpatti scale status post UPPP  surgery Neck: No JVD, no carotid bruits; normal carotid upstroke Lungs: clear to ausculatation and percussion; no wheezing or rales Chest wall: without tenderness to palpitation Heart: PMI not displaced, RRR, s1 s2 normal, 1/6 systolic murmur, no diastolic murmur, no rubs, gallops, thrills, or heaves Abdomen: soft, nontender; no hepatosplenomehaly, BS+; abdominal aorta nontender and not dilated by palpation. Back: no CVA tenderness Pulses 2+ Musculoskeletal: full range of motion, normal strength, no joint deformities Extremities: no clubbing cyanosis or edema, Homan's sign negative  Neurologic: grossly nonfocal; Cranial nerves grossly wnl Psychologic: Normal mood and affect  ECG (independently read by me): Atrial flutter at 56 bpm with variable block.  October 2018 ECG (independently read by me): Atrial flutter with a ventricular rate at 70 bpm.  Nonspecific ST changes.  QTc interval 4o6 ms.  LABS:  BMP Latest Ref Rng & Units 08/16/2017 06/26/2017  06/07/2016  Glucose 65 - 99 mg/dL 114(H) 112(H) 117(H)  BUN 6 - 20 mg/dL 32(H) 30(H) 29(H)  Creatinine 0.61 - 1.24 mg/dL 1.13 1.13 1.16  Sodium 135 - 145 mmol/L 140 140 142  Potassium 3.5 - 5.1 mmol/L 4.6 4.9 5.0  Chloride 101 - 111 mmol/L 111 106 106  CO2 22 - 32 mmol/L 21(L) 30 31  Calcium 8.9 - 10.3 mg/dL 8.8(L) 9.0 9.0     Hepatic Function Latest Ref Rng & Units 08/16/2017 06/26/2017 06/07/2016  Total Protein 6.5 - 8.1 g/dL 6.8 6.3 6.6  Albumin 3.5 - 5.0 g/dL 3.8 3.7 3.9  AST 15 - 41 U/L 18 15 19   ALT 17 - 63 U/L 22 19 24   Alk Phosphatase 38 - 126 U/L 67 59 60  Total Bilirubin 0.3 - 1.2 mg/dL 0.8 0.8 0.8  Bilirubin, Direct 0.0 - 0.3 mg/dL - 0.2 0.2     CBC Latest Ref Rng & Units 08/16/2017 06/26/2017 06/07/2016  WBC 4.0 - 10.5 K/uL 6.6 5.1 5.6  Hemoglobin 13.0 - 17.0 g/dL 15.5 14.9 15.7  Hematocrit 39.0 - 52.0 % 46.2 44.5 46.0  Platelets 150 - 400 K/uL 135(L) 161.0 148.0(L)     Lipid Panel     Component Value Date/Time   CHOL 162 06/26/2017 1023   TRIG 63.0 06/26/2017 1023   HDL 41.50 06/26/2017 1023   CHOLHDL 4 06/26/2017 1023   VLDL 12.6 06/26/2017 1023   LDLCALC 107 (H) 06/26/2017 1023   LDLDIRECT 143.5 01/22/2012 0916     RADIOLOGY: No results found.   IMPRESSION:  1. OSA (obstructive sleep apnea)   2. Typical atrial flutter ([place])   3. Long term current use of anticoagulant   4. Hypercholesterolemia   5. Periodic limb movement     ASSESSMENT AND PLAN: Gary [name] is a 82year-old gentleman who is a history of atrial flutter and is status post cardioversion.  However, he has developed recurrent atrial flutter and is followed by Dr. Sallyanne Kuster and has remained asymptomatic.  He has continued to be on anticoagulation with apixaban.  His blood pressure today was mildly elevated at 158/81 but on repeat by me was 138/80.  He continues to do exceptionally well with reference to his CPAP therapy for obstructive sleep apnea.  I obtained a download in the office which  confirms compliance and his AHI at 9 cm pressure is excellent at 0.7.  He is unaware of any breakthrough snoring.  I have recommended he try the new ResMed AirFit 10 N30i mask which he should tolerate well.  This will provide the tubing coming from the top of the head rather than the side and allow for improved turning without the tube getting in the way.  In addition this should believe he ate any nasal irritation.  He was noted to have periodic limb movements during sleep but he denies any symptoms associated with the urge to move for painful restless legs.  He continues to be on simvastatin 20 mg and is tolerating this well.  He will continue current treatment.  Per Medicare guidelines I will see him in 1 year for reevaluation  Time spent: 25 minutes all  Troy Sine, MD, [facility]  03/21/2018 7:48 PM

## 2018-03-20 ENCOUNTER — Other Ambulatory Visit: Payer: Self-pay | Admitting: Cardiovascular Disease

## 2018-03-20 ENCOUNTER — Telehealth: Payer: Self-pay | Admitting: *Deleted

## 2018-03-20 DIAGNOSIS — G4733 Obstructive sleep apnea (adult) (pediatric): Secondary | ICD-10-CM

## 2018-03-20 NOTE — Telephone Encounter (Signed)
Order placed into Epic for ResMed Airfit N30-I mask.

## 2018-03-20 NOTE — Telephone Encounter (Signed)
-----   Message from Silverio Lay, RN sent at 03/19/2018  5:29 PM EST ----- Please place order for new mask: Resmed Airfit N30I.   [facility]  Thanks!

## 2018-03-21 ENCOUNTER — Encounter: Payer: Self-pay | Admitting: Cardiovascular Disease

## 2018-04-09 DIAGNOSIS — Z85828 Personal history of other malignant neoplasm of skin: Secondary | ICD-10-CM | POA: Diagnosis not present

## 2018-04-09 DIAGNOSIS — L812 Freckles: Secondary | ICD-10-CM | POA: Diagnosis not present

## 2018-04-09 DIAGNOSIS — L821 Other seborrheic keratosis: Secondary | ICD-10-CM | POA: Diagnosis not present

## 2018-04-11 ENCOUNTER — Ambulatory Visit: Payer: Self-pay

## 2018-04-11 NOTE — Telephone Encounter (Signed)
Pt was working with trainer on Monday and trainer noted that he had an indentation with his sock elastic and pitting edema to his knee. Pt denies redness or pain where the swelling is. Pt given care advice and acute appointment made for tomorrow with Dr Ethlyn Gallery.  Pt has appointment to establish care with Dr Ethlyn Gallery 04/19/18.   Reason for Disposition . [1] MODERATE leg swelling (e.g., swelling extends up to knees) AND [2] new onset or worsening  Answer Assessment - Initial Assessment Questions 1. ONSET: "When did the swelling start?" (e.g., minutes, hours, days)     Monday 2. LOCATION: "What part of the leg is swollen?"  "Are both legs swollen or just one leg?"     Both  3. SEVERITY: "How bad is the swelling?" (e.g., localized; mild, moderate, severe)  - Localized - small area of swelling localized to one leg  - MILD pedal edema - swelling limited to foot and ankle, pitting edema < 1/4 inch (6 mm) deep, rest and elevation eliminate most or all swelling  - MODERATE edema - swelling of lower leg to knee, pitting edema > 1/4 inch (6 mm) deep, rest and elevation only partially reduce swelling  - SEVERE edema - swelling extends above knee, facial or hand swelling present      Moderate - top of socks leaves an indentation 4. REDNESS: "Does the swelling look red or infected?"     no 5. PAIN: "Is the swelling painful to touch?" If so, ask: "How painful is it?"   (Scale 1-10; mild, moderate or severe)     no 6. FEVER: "Do you have a fever?" If so, ask: "What is it, how was it measured, and when did it start?"      no 7. CAUSE: "What do you think is causing the leg swelling?"     Fluid retention 8. MEDICAL HISTORY: "Do you have a history of heart failure, kidney disease, liver failure, or cancer?"     afib heart murmur 9. RECURRENT SYMPTOM: "Have you had leg swelling before?" If so, ask: "When was the last time?" "What happened that time?"     Yes- does not remember the last time 10. OTHER  SYMPTOMS: "Do you have any other symptoms?" (e.g., chest pain, difficulty breathing)       no 11. PREGNANCY: "Is there any chance you are pregnant?" "When was your last menstrual period?"       n/a  Protocols used: LEG SWELLING AND EDEMA-A-AH

## 2018-04-12 ENCOUNTER — Encounter: Payer: Self-pay | Admitting: Family Medicine

## 2018-04-12 ENCOUNTER — Ambulatory Visit (INDEPENDENT_AMBULATORY_CARE_PROVIDER_SITE_OTHER): Payer: Medicare HMO | Admitting: Family Medicine

## 2018-04-12 VITALS — BP 122/80 | HR 72 | Temp 98.1°F | Wt 261.6 lb

## 2018-04-12 DIAGNOSIS — R600 Localized edema: Secondary | ICD-10-CM

## 2018-04-12 DIAGNOSIS — G4733 Obstructive sleep apnea (adult) (pediatric): Secondary | ICD-10-CM

## 2018-04-12 DIAGNOSIS — I483 Typical atrial flutter: Secondary | ICD-10-CM | POA: Diagnosis not present

## 2018-04-12 DIAGNOSIS — E785 Hyperlipidemia, unspecified: Secondary | ICD-10-CM | POA: Diagnosis not present

## 2018-04-12 DIAGNOSIS — I10 Essential (primary) hypertension: Secondary | ICD-10-CM | POA: Diagnosis not present

## 2018-04-12 DIAGNOSIS — R7301 Impaired fasting glucose: Secondary | ICD-10-CM

## 2018-04-12 LAB — COMPREHENSIVE METABOLIC PANEL
ALBUMIN: 4.1 g/dL (ref 3.5–5.2)
ALK PHOS: 68 U/L (ref 39–117)
ALT: 23 U/L (ref 0–53)
AST: 18 U/L (ref 0–37)
BILIRUBIN TOTAL: 0.8 mg/dL (ref 0.2–1.2)
BUN: 31 mg/dL — AB (ref 6–23)
CO2: 24 mEq/L (ref 19–32)
Calcium: 9.1 mg/dL (ref 8.4–10.5)
Chloride: 106 mEq/L (ref 96–112)
Creatinine, Ser: 1.05 mg/dL (ref 0.40–1.50)
GFR: 72.22 mL/min (ref >60.00)
GLUCOSE: 96 mg/dL (ref 70–99)
Potassium: 5 mEq/L (ref 3.5–5.1)
Sodium: 138 mEq/L (ref 135–145)
TOTAL PROTEIN: 6.7 g/dL (ref 6.0–8.3)

## 2018-04-12 LAB — CBC WITH DIFFERENTIAL/PLATELET
BASOS ABS: 0 10*3/uL (ref 0.0–0.1)
Basophils Relative: 0.3 % (ref 0.0–3.0)
Eosinophils Absolute: 0.1 10*3/uL (ref 0.0–0.7)
Eosinophils Relative: 1.6 % (ref 0.0–5.0)
HCT: 47.2 % (ref 39.0–52.0)
HEMOGLOBIN: 16 g/dL (ref 13.0–17.0)
LYMPHS ABS: 1.1 10*3/uL (ref 0.7–4.0)
LYMPHS PCT: 17.2 % (ref 12.0–46.0)
MCHC: 33.9 g/dL (ref 30.0–36.0)
MCV: 95 fl (ref 78.0–100.0)
Monocytes Absolute: 0.7 10*3/uL (ref 0.1–1.0)
Monocytes Relative: 11.6 % (ref 3.0–12.0)
NEUTROS PCT: 69.3 % (ref 43.0–77.0)
Neutro Abs: 4.4 10*3/uL (ref 1.4–7.7)
Platelets: 145 10*3/uL — ABNORMAL LOW (ref 150.0–400.0)
RBC: 4.97 Mil/uL (ref 4.22–5.81)
RDW: 13.5 % (ref 11.5–15.5)
WBC: 6.3 10*3/uL (ref 4.0–10.5)

## 2018-04-12 LAB — MICROALBUMIN / CREATININE URINE RATIO
CREATININE, U: 92 mg/dL
Microalb Creat Ratio: 65.3 mg/g — ABNORMAL HIGH (ref 0.0–30.0)
Microalb, Ur: 60.1 mg/dL — ABNORMAL HIGH (ref 0.0–1.9)

## 2018-04-12 LAB — LIPID PANEL
CHOL/HDL RATIO: 4
Cholesterol: 162 mg/dL (ref 0–200)
HDL: 42.1 mg/dL (ref >39.00)
LDL Cholesterol: 97 mg/dL (ref 0–99)
NONHDL: 120.39
Triglycerides: 115 mg/dL (ref 0.0–149.0)
VLDL: 23 mg/dL (ref 0.0–40.0)

## 2018-04-12 LAB — HEMOGLOBIN A1C: Hgb A1c MFr Bld: 6.1 % (ref 4.6–6.5)

## 2018-04-12 LAB — BRAIN NATRIURETIC PEPTIDE: PRO B NATRI PEPTIDE: 75 pg/mL (ref 0.0–100.0)

## 2018-04-12 NOTE — Progress Notes (Signed)
Gary [name] DOB: Aug 20, 1935 Encounter date: 04/12/2018  This is a 82 y.o. male who presents with Chief Complaint  Patient presents with  . Edema    Bilateral leg swelling - noticed by his Physical Trainer and was advised to be seen. Not sure how long they have been swollen - pt never noticed. Denies pain. NO SOB;    History of present illness: Sleep apnea: uses cpap every night.  HL: no issues with simvastatin. Takes the zocor 20mg  every other day.  RAX:ENMM check pressures at home. Last reading at home was 129/69; right before coming here was 130/74.  Taking eliquis for atrial flutter. Sees Dr. Loletha Grayer.    Not had issues with swelling in the past that he was aware of. Came in really to please wife and trainer who were concerned.   No chest pain, pressure, no trouble with breathing.    Had knee surgery in April and has been more sedentary since then.  Has gained a lot of weight in last year which he thinks was secondary to knee. Going through therapy now. Doing well. Works with Clinical research associate twice/week. Walks on treadmill. Does own yardwork.   Allergies are ok. Continues with flonase.   Has stayed on minocycline for rosacea. Takes every other day and does well with this. Keeps redness down.   No Known Allergies Current Meds  Medication Sig  . apixaban (ELIQUIS) 5 MG TABS tablet Take 1 tablet (5 mg total) by mouth 2 (two) times daily.  [name] co-enzyme Q-10 30 MG capsule Take 30 mg by mouth every other day.   . fluticasone (FLONASE) 50 MCG/ACT nasal spray Place 1 spray into both nostrils every other day.  . lisinopril (PRINIVIL,ZESTRIL) 5 MG tablet Take 1 tablet (5 mg total) by mouth daily.  . minocycline (MINOCIN,DYNACIN) 100 MG capsule minocycline 100 mg capsule  . Multiple Vitamin (MULTIVITAMIN) tablet Take 1 tablet by mouth daily.  . simvastatin (ZOCOR) 20 MG tablet Take 1 tablet (20 mg total) by mouth at bedtime. (Patient taking differently: Take 20 mg by mouth every other day. )    Current Facility-Administered Medications for the 04/12/18 encounter (Office Visit) with Caren Macadam, MD  Medication  . 0.9 %  sodium chloride infusion    Review of Systems  Constitutional: Negative for chills, fatigue and fever.  Respiratory: Negative for cough, chest tightness, shortness of breath and wheezing.   Cardiovascular: Negative for chest pain, palpitations and leg swelling.  Musculoskeletal: Positive for arthralgias (right knee pain).    Objective:  BP 122/80 (BP Location: Left Arm, Patient Position: Sitting, Cuff Size: Normal)   Pulse 72   Temp 98.1 F (36.7 C) (Oral)   Wt 261 lb 9.6 oz (118.7 kg)   SpO2 96%   BMI 32.70 kg/m   Weight: 261 lb 9.6 oz (118.7 kg)   BP Readings from Last 3 Encounters:  04/12/18 122/80  03/19/18 (!) 158/81  03/01/18 134/66   Wt Readings from Last 3 Encounters:  04/12/18 261 lb 9.6 oz (118.7 kg)  03/19/18 261 lb 6.4 oz (118.6 kg)  03/01/18 258 lb (117 kg)    Physical Exam Constitutional:      General: He is not in acute distress.    Appearance: He is well-developed.  Cardiovascular:     Rate and Rhythm: Normal rate and regular rhythm.     Heart sounds: Normal heart sounds. No murmur. No friction rub.     Comments: 1+ edema RLE; trace LLE Pulmonary:  Effort: Pulmonary effort is normal. No respiratory distress.     Breath sounds: Normal breath sounds. No wheezing or rales.     Comments: Minimal atelectasis right lower lobe Neurological:     Mental Status: He is alert and oriented to person, place, and time.  Psychiatric:        Behavior: Behavior normal.     Assessment/Plan 1. Edema of both lower extremities Monitor, elevate when sitting. Let us know if any worsening.  - Brain natriuretic peptide; Future  2. Impaired fasting glucose  - Microalbumin / creatinine urine ratio; Future - Hemoglobin A1c; Future  3. Hyperlipidemia, unspecified hyperlipidemia type  - Lipid panel; Future - Comprehensive  metabolic panel; Future  4. Typical atrial flutter ([facility]) Stable; sinus rhythm today.  5. Essential hypertension Stable. Continue current medications.  - CBC with Differential/Platelet; Future  6. Obstructive sleep apnea Continue with cpap nightly.    Return in about 6 months (around 10/12/2018) for physical exam.     [name], MD

## 2018-04-12 NOTE — Addendum Note (Signed)
Addended by: Gwynne Edinger on: 04/12/2018 02:11 PM   Modules accepted: Orders

## 2018-04-19 ENCOUNTER — Ambulatory Visit: Payer: Medicare HMO | Admitting: Family Medicine

## 2018-05-21 ENCOUNTER — Other Ambulatory Visit: Payer: Self-pay | Admitting: Cardiovascular Disease

## 2018-06-03 DIAGNOSIS — H35361 Drusen (degenerative) of macula, right eye: Secondary | ICD-10-CM | POA: Diagnosis not present

## 2018-06-03 DIAGNOSIS — H35031 Hypertensive retinopathy, right eye: Secondary | ICD-10-CM | POA: Diagnosis not present

## 2018-06-03 DIAGNOSIS — H34832 Tributary (branch) retinal vein occlusion, left eye, with macular edema: Secondary | ICD-10-CM | POA: Diagnosis not present

## 2018-06-03 DIAGNOSIS — H43813 Vitreous degeneration, bilateral: Secondary | ICD-10-CM | POA: Diagnosis not present

## 2018-07-09 ENCOUNTER — Encounter: Payer: Self-pay | Admitting: Family Medicine

## 2018-07-09 ENCOUNTER — Telehealth: Payer: Self-pay

## 2018-07-09 DIAGNOSIS — R7301 Impaired fasting glucose: Secondary | ICD-10-CM | POA: Insufficient documentation

## 2018-07-09 DIAGNOSIS — L719 Rosacea, unspecified: Secondary | ICD-10-CM | POA: Insufficient documentation

## 2018-07-09 NOTE — Progress Notes (Signed)
Subjective:   Gary [name] is a 83 y.o. male who presents for Medicare Annual/Subsequent preventive examination.  Review of Systems:  No ROS.  Medicare Wellness Visit. Additional risk factors are reflected in the social history.  Cardiac Risk Factors include: obesity (BMI >30kg/m2);advanced age (>63men, >7 women);male gender;dyslipidemia;hypertension Sleep patterns: feels rested on waking and gets up 3-4 times nightly to void. States he uses his CPAP HS, no concerns with quality of sleep.    Home Safety/Smoke Alarms: Feels safe in home. Smoke alarms in place.  Living environment; residence and Firearm Safety: 2-story house. No use or need for DME at this time.  Seat Belt Safety/Bike Helmet: Wears seat belt.    Male:   CCS- 01/2018, no need for follow-up d/t age per Dr. Fuller Plan   PSA-  Lab Results  Component Value Date   PSA 1.07 06/26/2017   PSA 1.03 06/07/2016   PSA 0.91 05/24/2015       Objective:    Vitals: BP 112/62 (BP Location: Right Arm, Patient Position: Sitting, Cuff Size: Normal)   Pulse (!) 54   Temp 98.4 F (36.9 C)   Resp 16   Ht 6\' 3"  (1.905 m)   Wt 246 lb (111.6 kg)   SpO2 96%   BMI 30.75 kg/m   Body mass index is 30.75 kg/m.  Advanced Directives 07/10/2018 08/27/2017 08/16/2017 08/16/2017 10/08/2014 09/07/2014 07/01/2014  Does Patient Have a Medical Advance Directive? Yes Yes Yes No No No No  Type of Paramedic of [place];Living will [place];Living will [place];Living will - - - -  Does patient want to make changes to medical advance directive? No - Patient declined No - Patient declined No - Patient declined - - - -  Copy of [place] in Chart? No - copy requested No - copy requested Yes - - - -  Would patient like information on creating a medical advance directive? - No - Patient declined - No - Patient declined No - patient declined information No - patient declined  information No - patient declined information    Tobacco Social History   Tobacco Use  Smoking Status Former Smoker  Smokeless Tobacco Never Used     Counseling given: Not Answered   Past Medical History:  Diagnosis Date  . Adenomatous colon polyp 06/2000  . ALLERGIC RHINITIS 10/03/2007  . Allergy    seasonal  . Arthritis    knees  . Atrial flutter ([place]) 06/26/2014  . Cancer ([place])    skin cancers removed in the past basal cell and squamous cell  . CARDIAC MURMUR, AORTIC 10/03/2007  . ERECTILE DYSFUNCTION 10/03/2007  . High blood pressure 03/2016  . HYPERLIPIDEMIA 10/03/2007  . Rosacea   . Sleep apnea    Past Surgical History:  Procedure Laterality Date  . ADENOIDECTOMY    . CARDIOVERSION N/A 07/01/2014   Procedure: CARDIOVERSION;  Surgeon: [name], MD;  Location: [facility];  Service: Cardiovascular;  Laterality: N/A;  . COLONOSCOPY    . TEE WITHOUT CARDIOVERSION N/A 07/01/2014   Procedure: TRANSESOPHAGEAL ECHOCARDIOGRAM (TEE);  Surgeon: [name], MD;  Location: [facility] ENDOSCOPY;  Service: Cardiovascular;  Laterality: N/A;  . TONSILLECTOMY    . TOTAL KNEE ARTHROPLASTY Right 08/27/2017   Procedure: RIGHT TOTAL KNEE ARTHROPLASTY;  Surgeon: Vickey Huger, MD;  Location: [place];  Service: Orthopedics;  Laterality: Right;   Family History  Problem Relation Age of Onset  . Diabetes Mother   .  Cancer Father        liver ca  . Liver cancer Father   . Stomach cancer Paternal Aunt    Social History   Socioeconomic History  . Marital status: Married    Spouse name: Not on file  . Number of children: 2  . Years of education: Not on file  . Highest education level: Not on file  Occupational History  . 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 Financial resource strain: Not hard at all  . Food insecurity:    Worry: Never true    Inability: Never true  . Transportation needs:    Medical: No    Non-medical: No  Tobacco Use  .  Smoking status: Former Research scientist (life sciences)  . Smokeless tobacco: Never Used  Substance and Sexual Activity  . Alcohol use: Yes    Alcohol/week: 8.0 standard drinks    Types: 8 Glasses of wine per week    Comment: ocassionally   . Drug use: No  . Sexual activity: Not on file  Lifestyle  . Physical activity:    Days per week: 2 days    Minutes per session: 30 min  . Stress: Not at all  Relationships  . Social connections:    Talks on phone: Twice a week    Gets together: Twice a week    Attends religious service: Never    Active member of club or organization: Yes    Attends meetings of clubs or organizations: More than 4 times per year    Relationship status: Married  Other Topics Concern  . Not on file  Social History Narrative   Lives with wife in two story house, but occupies main level mostly   Has 4 children, two biological, two stepchildren, 9 grandkids   One son local, supportive    Active playing golf with friends, goes to Physiological scientist 2 days/week at [employer]   Enjoys reading, [place]    Outpatient Encounter Medications as of 07/10/2018  Medication Sig  . co-enzyme Q-10 30 MG capsule Take 30 mg by mouth every other day.   [name] ELIQUIS 5 MG TABS tablet TAKE 1 TABLET TWICE DAILY  . fluticasone (FLONASE) 50 MCG/ACT nasal spray Place 1 spray into both nostrils every other day.  . lisinopril (PRINIVIL,ZESTRIL) 5 MG tablet Take 1 tablet (5 mg total) by mouth daily.  . minocycline (MINOCIN,DYNACIN) 100 MG capsule minocycline 100 mg capsule  . Multiple Vitamin (MULTIVITAMIN) tablet Take 1 tablet by mouth daily.  . simvastatin (ZOCOR) 20 MG tablet Take 1 tablet (20 mg total) by mouth at bedtime. (Patient taking differently: Take 20 mg by mouth every other day. )   Facility-Administered Encounter Medications as of 07/10/2018  Medication  . 0.9 %  sodium chloride infusion    Activities of Daily Living In your present state of health, do you have any difficulty performing the  following activities: 07/10/2018 08/27/2017  Hearing? N N  Vision? N N  Difficulty concentrating or making decisions? N N  Walking or climbing stairs? N Y  Dressing or bathing? N N  Doing errands, shopping? N Y  Conservation officer, nature and eating ? N -  Using the Toilet? N -  In the past six months, have you accidently leaked urine? N -  Do you have problems with loss of bowel control? N -  Managing your Medications? N -  Managing your Finances? N -  Housekeeping or managing your Housekeeping?  N -  Some recent data might be hidden    Patient Care Team: Caren Macadam, MD as PCP - General (Family Medicine) Croitoru, Dani Gobble, MD as PCP - Cardiology (Cardiology) Jarome Matin, MD as Consulting Physician (Dermatology) Troy Sine, MD as Consulting Physician (Cardiology) Desma Maxim, MD as Referring Physician (Ophthalmology)   Assessment:   This is a routine wellness examination for [place]. Physical assessment deferred to PCP.   Exercise Activities and Dietary recommendations Current Exercise Habits: Structured exercise class, Type of exercise: treadmill;calisthenics;strength training/weights, Time (Minutes): 30, Frequency (Times/Week): 2, Weekly Exercise (Minutes/Week): 60, Intensity: Moderate, Exercise limited by: cardiac condition(s). Sees personal trainer 2 days/week, enjoys golfing. Diet (meal preparation, eat out, water intake, caffeinated beverages, dairy products, fruits and vegetables): Has been attempting to lose weight via [employer]. Author cautioned about high fat/high protein trends, and encouraged pt. to still incorporate carbohydrates, while eliminating the simple sugars. Pt. stated he and continues to stay hydrated as well. Weight loss tips provided in writing.      Goals    . Patient Stated     Continue to lose weight, with goal of 225lb-235lb by next year.       Fall Risk Fall Risk  07/10/2018 06/14/2016 05/31/2015 05/12/2014 04/15/2013  Falls in the past year?  0 No No Yes No  Number falls in past yr: - - - 1 -  Injury with Fall? - - - Yes -  Risk for fall due to : Impaired vision - - - -     Depression Screen PHQ 2/9 Scores 07/10/2018 06/14/2016 05/31/2015 05/12/2014  PHQ - 2 Score 0 0 0 0  PHQ- 9 Score 0 - - -    Cognitive Function       Ad8 score reviewed for issues:  Issues making decisions: no  Less interest in hobbies / activities: no  Repeats questions, stories (family complaining): no  Trouble using ordinary gadgets (microwave, computer, phone):no  Forgets the month or year: no  Mismanaging finances: no  Remembering appts: no  Daily problems with thinking and/or memory: no Ad8 score is= 0   Immunization History  Administered Date(s) Administered  . Influenza Split 01/22/2012  . Influenza, High Dose Seasonal PF 03/13/2014, 05/31/2015, 03/14/2016, 03/18/2018  . Influenza,inj,Quad PF,6+ Mos 04/15/2013  . Pneumococcal Conjugate-13 04/15/2013, 05/12/2014  . Pneumococcal Polysaccharide-23 08/19/2004, 10/19/2009  . Td 08/19/2004    Qualifies for Shingles Vaccine? Yes, advised to receive at local pharmacy.  Screening Tests Health Maintenance  Topic Date Due  . FOOT EXAM  07/10/2019 (Originally 03/16/1946)  . HEMOGLOBIN A1C  10/12/2018  . OPHTHALMOLOGY EXAM  06/02/2019  . COLONOSCOPY  01/31/2023  . TETANUS/TDAP  05/31/2025  . INFLUENZA VACCINE  Completed  . PNA vac Low Risk Adult  Completed        Plan:    Bring a copy of your living will and/or healthcare power of attorney to your next office visit.  Plan to get shingles (shingrix) vaccine at your local pharmacy. It is 2 doses, 2-6 months apart.  Continue doing brain stimulating activities (puzzles, reading, adult coloring books, staying active) to keep memory sharp.   Refer to weight loss tips provided. I have personally reviewed and noted the following in the patient's chart:   . Medical and social history . Use of alcohol, tobacco or illicit drugs   . Current medications and supplements . Functional ability and status . Nutritional status . Physical activity . Advanced directives . List of  other physicians . Hospitalizations, surgeries, and ER visits in previous 12 months . Vitals . Screenings to include cognitive, depression, and falls . Referrals and appointments  In addition, I have reviewed and discussed with patient certain preventive protocols, quality metrics, and best practice recommendations. A written personalized care plan for preventive services as well as general preventive health recommendations were provided to patient.     Alphia Moh, RN  07/10/2018

## 2018-07-09 NOTE — Telephone Encounter (Signed)
Author phoned pt. to offer to schedule awv prior to or after awv. Pt. stated he'd be willing to be seen at 0800 prior to 0830 CPE. Appointment scheduled.

## 2018-07-09 NOTE — Progress Notes (Signed)
Gary [name] DOB: 1935/05/28 Encounter date: 07/10/2018  This is a 83 y.o. male who presents for complete physical   History of present illness/Additional concerns:  Last visit in December he was seen for some newer onset bilat LE edema: hasn't been issue.   A flutter: follows with Dr. Sallyanne Kuster. On eliquis.   HTN: does check bp at home; usually runs between 120/70's.   Allergic rhinitis:uses flonase. Not having any current allergy issues  Total knee in April: feels like he is pretty much back to normal  Rosacea: controlled; follows with derm q 6 months.   OSA: uses cpap every night. Makes a good improvement for him.   Works out with trainer 2 days/week.  Past Medical History:  Diagnosis Date  . Adenomatous colon polyp 06/2000  . ALLERGIC RHINITIS 10/03/2007  . Allergy    seasonal  . Arthritis    knees  . Atrial flutter ([place]) 06/26/2014  . Cancer ([place])    skin cancers removed in the past basal cell and squamous cell  . CARDIAC MURMUR, AORTIC 10/03/2007  . ERECTILE DYSFUNCTION 10/03/2007  . High blood pressure 03/2016  . HYPERLIPIDEMIA 10/03/2007  . Rosacea   . Sleep apnea    Past Surgical History:  Procedure Laterality Date  . ADENOIDECTOMY    . CARDIOVERSION N/A 07/01/2014   Procedure: CARDIOVERSION;  Surgeon: [name], MD;  Location: [facility] ENDOSCOPY;  Service: Cardiovascular;  Laterality: N/A;  . COLONOSCOPY    . PALATE SURGERY    . TEE WITHOUT CARDIOVERSION N/A 07/01/2014   Procedure: TRANSESOPHAGEAL ECHOCARDIOGRAM (TEE);  Surgeon: [name], MD;  Location: [facility] ENDOSCOPY;  Service: Cardiovascular;  Laterality: N/A;  . TONSILLECTOMY    . TOTAL KNEE ARTHROPLASTY Right 08/27/2017   Procedure: RIGHT TOTAL KNEE ARTHROPLASTY;  Surgeon: Vickey Huger, MD;  Location: [place];  Service: Orthopedics;  Laterality: Right;   No Known Allergies Current Meds  Medication Sig  . co-enzyme Q-10 30 MG capsule Take 30 mg by mouth every other day.   [name] ELIQUIS 5 MG TABS tablet TAKE 1  TABLET TWICE DAILY  . fluticasone (FLONASE) 50 MCG/ACT nasal spray Place 1 spray into both nostrils every other day.  . lisinopril (PRINIVIL,ZESTRIL) 5 MG tablet Take 1 tablet (5 mg total) by mouth daily.  . minocycline (MINOCIN,DYNACIN) 100 MG capsule minocycline 100 mg capsule  . Multiple Vitamin (MULTIVITAMIN) tablet Take 1 tablet by mouth daily.  . simvastatin (ZOCOR) 20 MG tablet Take 1 tablet (20 mg total) by mouth at bedtime. (Patient taking differently: Take 20 mg by mouth every other day. )   Current Facility-Administered Medications for the 07/10/18 encounter (Office Visit) with Caren Macadam, MD  Medication  . 0.9 %  sodium chloride infusion   Social History   Tobacco Use  . Smoking status: Former Research scientist (life sciences)  . Smokeless tobacco: Never Used  Substance Use Topics  . Alcohol use: Yes    Alcohol/week: 8.0 standard drinks    Types: 8 Glasses of wine per week    Comment: ocassionally    Family History  Problem Relation Age of Onset  . Diabetes Mother   . Cancer Father        liver ca  . Liver cancer Father   . Hypotension Sister   . CAD Brother   . Stomach cancer Paternal Aunt      Review of Systems  Constitutional: Negative for activity change, appetite change, chills, fatigue, fever and unexpected weight change.  HENT: Negative for congestion, ear  pain, hearing loss, sinus pressure, sinus pain, sore throat and trouble swallowing.   Eyes: Negative for pain and visual disturbance.  Respiratory: Negative for cough, chest tightness, shortness of breath and wheezing.   Cardiovascular: Negative for chest pain, palpitations and leg swelling.  Gastrointestinal: Negative for abdominal distention, abdominal pain, blood in stool, constipation, diarrhea, nausea and vomiting.  Genitourinary: Negative for decreased urine volume, difficulty urinating, dysuria, penile pain and testicular pain.  Musculoskeletal: Negative for arthralgias, back pain and joint swelling.  Skin:  Negative for rash.  Neurological: Negative for dizziness, weakness, numbness and headaches.  Hematological: Negative for adenopathy. Does not bruise/bleed easily.  Psychiatric/Behavioral: Negative for agitation, sleep disturbance and suicidal ideas. The patient is not nervous/anxious.     CBC:  Lab Results  Component Value Date   WBC 6.3 04/12/2018   HGB 16.0 04/12/2018   HCT 47.2 04/12/2018   MCH 32.7 08/16/2017   MCHC 33.9 04/12/2018   RDW 13.5 04/12/2018   PLT 145.0 (L) 04/12/2018   MPV 11.1 06/29/2014   CMP: Lab Results  Component Value Date   NA 138 04/12/2018   K 5.0 04/12/2018   CL 106 04/12/2018   CO2 24 04/12/2018   ANIONGAP 8 08/16/2017   GLUCOSE 96 04/12/2018   BUN 31 (H) 04/12/2018   CREATININE 1.05 04/12/2018   CREATININE 0.90 06/29/2014   GFRAA >60 08/16/2017   CALCIUM 9.1 04/12/2018   PROT 6.7 04/12/2018   BILITOT 0.8 04/12/2018   ALKPHOS 68 04/12/2018   ALT 23 04/12/2018   AST 18 04/12/2018   LIPID: Lab Results  Component Value Date   CHOL 162 04/12/2018   TRIG 115.0 04/12/2018   HDL 42.10 04/12/2018   LDLCALC 97 04/12/2018    Objective:  BP 112/62 (BP Location: Left Arm, Patient Position: Sitting, Cuff Size: Normal)   Pulse (!) 54   Temp 98.4 F (36.9 C) (Oral)   Wt 246 lb (111.6 kg)   SpO2 96%   BMI 30.75 kg/m   Weight: 246 lb (111.6 kg)   BP Readings from Last 3 Encounters:  07/10/18 112/62  07/10/18 112/62  04/12/18 122/80   Wt Readings from Last 3 Encounters:  07/10/18 246 lb (111.6 kg)  07/10/18 246 lb (111.6 kg)  04/12/18 261 lb 9.6 oz (118.7 kg)    Physical Exam Constitutional:      General: He is not in acute distress.    Appearance: He is well-developed.  HENT:     Head: Normocephalic and atraumatic.     Right Ear: External ear normal.     Left Ear: External ear normal.     Nose: Nose normal.     Mouth/Throat:     Pharynx: No oropharyngeal exudate.  Eyes:     Conjunctiva/sclera: Conjunctivae normal.      Pupils: Pupils are equal, round, and reactive to light.  Neck:     Musculoskeletal: Neck supple.     Thyroid: No thyromegaly.  Cardiovascular:     Rate and Rhythm: Normal rate and regular rhythm.     Heart sounds: Normal heart sounds. No murmur. No friction rub. No gallop.   Pulmonary:     Effort: Pulmonary effort is normal. No respiratory distress.     Breath sounds: Normal breath sounds. No stridor. No wheezing or rales.  Abdominal:     General: Bowel sounds are normal.     Palpations: Abdomen is soft.  Musculoskeletal: Normal range of motion.  Skin:    General: Skin is warm  and dry.  Neurological:     Mental Status: He is alert and oriented to person, place, and time.  Psychiatric:        Behavior: Behavior normal.        Thought Content: Thought content normal.        Judgment: Judgment normal.     Assessment/Plan: There are no preventive care reminders to display for this patient. Health Maintenance reviewed. 1. Typical atrial flutter (HCC) Rate controlled; sounds in sinus today. Follows with cardiology.  2. Essential hypertension Stable; continue current medication but monitor for hypotension. - CBC with Differential/Platelet; Future - Comprehensive metabolic panel; Future  3. Rosacea Stable. Follows with dermatology.  4. Elevated fasting glucose Has changed diet, exercising regularly. I suspect sugar is improving with all of these lifestyle changes.   5. Need for shingles vaccine - Zoster Vaccine Adjuvanted [facility]) injection; Inject 0.5 mLs into the muscle once for 1 dose. Repeat in 2-6 months  Dispense: 0.5 mL; Refill: 0  6. Hyperlipidemia, unspecified hyperlipidemia type Tolerating zocor; continue this.  - Lipid panel; Future   Return bloodwork prior to appointment [place] in 6 mo.  [name], MD  Can discuss prostate exam at next visit; last colonoscopy 01/2018 with benign polyps; per GI no further colonoscopy recommended. Bloodwork in June

## 2018-07-10 ENCOUNTER — Encounter: Payer: Self-pay | Admitting: Family Medicine

## 2018-07-10 ENCOUNTER — Ambulatory Visit (INDEPENDENT_AMBULATORY_CARE_PROVIDER_SITE_OTHER): Payer: Medicare HMO | Admitting: Family Medicine

## 2018-07-10 ENCOUNTER — Ambulatory Visit (INDEPENDENT_AMBULATORY_CARE_PROVIDER_SITE_OTHER): Payer: Medicare HMO

## 2018-07-10 VITALS — BP 112/62 | HR 54 | Temp 98.4°F | Resp 16 | Ht 75.0 in | Wt 246.0 lb

## 2018-07-10 VITALS — BP 112/62 | HR 54 | Temp 98.4°F | Wt 246.0 lb

## 2018-07-10 DIAGNOSIS — I10 Essential (primary) hypertension: Secondary | ICD-10-CM | POA: Diagnosis not present

## 2018-07-10 DIAGNOSIS — R7301 Impaired fasting glucose: Secondary | ICD-10-CM | POA: Diagnosis not present

## 2018-07-10 DIAGNOSIS — I483 Typical atrial flutter: Secondary | ICD-10-CM | POA: Diagnosis not present

## 2018-07-10 DIAGNOSIS — E785 Hyperlipidemia, unspecified: Secondary | ICD-10-CM

## 2018-07-10 DIAGNOSIS — Z0000 Encounter for general adult medical examination without abnormal findings: Secondary | ICD-10-CM

## 2018-07-10 DIAGNOSIS — Z23 Encounter for immunization: Secondary | ICD-10-CM | POA: Diagnosis not present

## 2018-07-10 DIAGNOSIS — L719 Rosacea, unspecified: Secondary | ICD-10-CM | POA: Diagnosis not present

## 2018-07-10 MED ORDER — ZOSTER VAC RECOMB ADJUVANTED 50 MCG/0.5ML IM SUSR: 0.5000 mL | Freq: Once | INTRAMUSCULAR | 0 refills | Status: AC

## 2018-07-10 NOTE — Patient Instructions (Signed)
Please keep monitoring home blood pressures. Let me know if regularly less than 615 systolic or diastolic is regularly less than 70. Also check blood pressures if not feeling well or light headed.

## 2018-07-10 NOTE — Patient Instructions (Addendum)
Bring a copy of your living will and/or healthcare power of attorney to your next office visit.  Plan to get shingles (shingrix) vaccine at your local pharmacy. It is 2 doses, 2-6 months apart.  Continue doing brain stimulating activities (puzzles, reading, adult coloring books, staying active) to keep memory sharp.   Refer to weight loss tips provided.   Mr. Oriordan , Thank you for taking time to come for your Medicare Wellness Visit. I appreciate your ongoing commitment to your health goals. Please review the following plan we discussed and let me know if I can assist you in the future.   These are the goals we discussed: Goals    . Patient Stated     Continue to lose weight, with goal of 225lb-235lb by next year.       This is a list of the screening recommended for you and due dates:  Health Maintenance  Topic Date Due  . Complete foot exam   07/10/2019*  . Hemoglobin A1C  10/12/2018  . Eye exam for diabetics  06/02/2019  . Colon Cancer Screening  01/31/2023  . Tetanus Vaccine  05/31/2025  . Flu Shot  Completed  . Pneumonia vaccines  Completed  *Topic was postponed. The date shown is not the original due date.     Zoster Vaccine, Recombinant injection What is this medicine? ZOSTER VACCINE (ZOS ter vak SEEN) is used to prevent shingles in adults 81 years old and over. This vaccine is not used to treat shingles or nerve pain from shingles. This medicine may be used for other purposes; ask your health care provider or pharmacist if you have questions. COMMON BRAND NAME(S): [facility] What should I tell my health care provider before I take this medicine? They need to know if you have any of these conditions: -blood disorders or disease -cancer like leukemia or lymphoma -immune system problems or therapy -an unusual or allergic reaction to vaccines, other medications, foods, dyes, or preservatives -pregnant or trying to get pregnant -breast-feeding How should I use this  medicine? This vaccine is for injection in a muscle. It is given by a health care professional. Talk to your pediatrician regarding the use of this medicine in children. This medicine is not approved for use in children. Overdosage: If you think you have taken too much of this medicine contact a poison control center or emergency room at once. NOTE: This medicine is only for you. Do not share this medicine with others. What if I miss a dose? Keep appointments for follow-up (booster) doses as directed. It is important not to miss your dose. Call your doctor or health care professional if you are unable to keep an appointment. What may interact with this medicine? -medicines that suppress your immune system -medicines to treat cancer -steroid medicines like prednisone or cortisone This list may not describe all possible interactions. Give your health care provider a list of all the medicines, herbs, non-prescription drugs, or dietary supplements you use. Also tell them if you smoke, drink alcohol, or use illegal drugs. Some items may interact with your medicine. What should I watch for while using this medicine? Visit your doctor for regular check ups. This vaccine, like all vaccines, may not fully protect everyone. What side effects may I notice from receiving this medicine? Side effects that you should report to your doctor or health care professional as soon as possible: -allergic reactions like skin rash, itching or hives, swelling of the face, lips, or tongue -breathing problems  Side effects that usually do not require medical attention (report these to your doctor or health care professional if they continue or are bothersome): -chills -headache -fever -nausea, vomiting -redness, warmth, pain, swelling or itching at site where injected -tiredness This list may not describe all possible side effects. Call your doctor for medical advice about side effects. You may report side effects to  FDA at 1-800-FDA-1088. Where should I keep my medicine? This vaccine is only given in a clinic, pharmacy, doctor's office, or other health care setting and will not be stored at home. NOTE: This sheet is a summary. It may not cover all possible information. If you have questions about this medicine, talk to your doctor, pharmacist, or health care provider.  2019 Elsevier/Gold Standard (2016-11-27 13:20:30)   Health Maintenance, Male A healthy lifestyle and preventive care is important for your health and wellness. Ask your health care provider about what schedule of regular examinations is right for you. What should I know about weight and diet? Eat a Healthy Diet  Eat plenty of vegetables, fruits, whole grains, low-fat dairy products, and lean protein.  Do not eat a lot of foods high in solid fats, added sugars, or salt.  Maintain a Healthy Weight Regular exercise can help you achieve or maintain a healthy weight. You should:  Do at least 150 minutes of exercise each week. The exercise should increase your heart rate and make you sweat (moderate-intensity exercise).  Do strength-training exercises at least twice a week. Watch Your Levels of Cholesterol and Blood Lipids  Have your blood tested for lipids and cholesterol every 5 years starting at 81 years of age. If you are at high risk for heart disease, you should start having your blood tested when you are 81 years old. You may need to have your cholesterol levels checked more often if: ? Your lipid or cholesterol levels are high. ? You are older than 81 years of age. ? You are at high risk for heart disease. What should I know about cancer screening? Many types of cancers can be detected early and may often be prevented. Lung Cancer  You should be screened every year for lung cancer if: ? You are a current smoker who has smoked for at least 30 years. ? You are a former smoker who has quit within the past 15 years.  Talk to  your health care provider about your screening options, when you should start screening, and how often you should be screened. Colorectal Cancer  Routine colorectal cancer screening usually begins at 81 years of age and should be repeated every 5-10 years until you are 81 years old. You may need to be screened more often if early forms of precancerous polyps or small growths are found. Your health care provider may recommend screening at an earlier age if you have risk factors for colon cancer.  Your health care provider may recommend using home test kits to check for hidden blood in the stool.  A small camera at the end of a tube can be used to examine your colon (sigmoidoscopy or colonoscopy). This checks for the earliest forms of colorectal cancer. Prostate and Testicular Cancer  Depending on your age and overall health, your health care provider may do certain tests to screen for prostate and testicular cancer.  Talk to your health care provider about any symptoms or concerns you have about testicular or prostate cancer. Skin Cancer  Check your skin from head to toe regularly.  Tell  your health care provider about any new moles or changes in moles, especially if: ? There is a change in a mole's size, shape, or color. ? You have a mole that is larger than a pencil eraser.  Always use sunscreen. Apply sunscreen liberally and repeat throughout the day.  Protect yourself by wearing long sleeves, pants, a wide-brimmed hat, and sunglasses when outside. What should I know about heart disease, diabetes, and high blood pressure?  If you are 41-40 years of age, have your blood pressure checked every 3-5 years. If you are 23 years of age or older, have your blood pressure checked every year. You should have your blood pressure measured twice-once when you are at a hospital or clinic, and once when you are not at a hospital or clinic. Record the average of the two measurements. To check your blood  pressure when you are not at a hospital or clinic, you can use: ? An automated blood pressure machine at a pharmacy. ? A home blood pressure monitor.  Talk to your health care provider about your target blood pressure.  If you are between 22-35 years old, ask your health care provider if you should take aspirin to prevent heart disease.  Have regular diabetes screenings by checking your fasting blood sugar level. ? If you are at a normal weight and have a low risk for diabetes, have this test once every three years after the age of 49. ? If you are overweight and have a high risk for diabetes, consider being tested at a younger age or more often.  A one-time screening for abdominal aortic aneurysm (AAA) by ultrasound is recommended for men aged 70-75 years who are current or former smokers. What should I know about preventing infection? Hepatitis B If you have a higher risk for hepatitis B, you should be screened for this virus. Talk with your health care provider to find out if you are at risk for hepatitis B infection. Hepatitis C Blood testing is recommended for:  Everyone born from [birth year] through 1963.  Anyone with known risk factors for hepatitis C. Sexually Transmitted Diseases (STDs)  You should be screened each year for STDs including gonorrhea and chlamydia if: ? You are sexually active and are younger than 81 years of age. ? You are older than 81 years of age and your health care provider tells you that you are at risk for this type of infection. ? Your sexual activity has changed since you were last screened and you are at an increased risk for chlamydia or gonorrhea. Ask your health care provider if you are at risk.  Talk with your health care provider about whether you are at high risk of being infected with HIV. Your health care provider may recommend a prescription medicine to help prevent HIV infection. What else can I do?  Schedule regular health, dental, and eye  exams.  Stay current with your vaccines (immunizations).  Do not use any tobacco products, such as cigarettes, chewing tobacco, and e-cigarettes. If you need help quitting, ask your health care provider.  Limit alcohol intake to no more than 2 drinks per day. One drink equals 12 ounces of beer, 5 ounces of wine, or 1 ounces of hard liquor.  Do not use street drugs.  Do not share needles.  Ask your health care provider for help if you need support or information about quitting drugs.  Tell your health care provider if you often feel depressed.  Tell your health  care provider if you have ever been abused or do not feel safe at home. This information is not intended to replace advice given to you by your health care provider. Make sure you discuss any questions you have with your health care provider. Document Released: 10/14/2007 Document Revised: 12/15/2015 Document Reviewed: 01/19/2015 Elsevier Interactive Patient Education  2019 [employer].   Hearing Loss  Hearing loss is a partial or total loss of the ability to hear. This can be temporary or permanent, and it can happen in one or both ears. Hearing loss may be referred to as deafness. Medical care is necessary to treat hearing loss properly and to prevent the condition from getting worse. Your hearing may partially or completely come back, depending on what caused your hearing loss and how severe it is. In some cases, hearing loss is permanent. What are the causes? Common causes of hearing loss include:  Too much wax in the ear canal.  Infection of the ear canal or middle ear.  Fluid in the middle ear.  Injury to the ear or surrounding area.  An object stuck in the ear.  Prolonged exposure to loud sounds, such as music. Less common causes of hearing loss include:  Tumors in the ear.  Viral or bacterial infections, such as meningitis.  A hole in the eardrum (perforated eardrum).  Problems with the hearing nerve  that sends signals between the brain and the ear.  Certain medicines. What are the signs or symptoms? Symptoms of this condition may include:  Difficulty telling the difference between sounds.  Difficulty following a conversation when there is background noise.  Lack of response to sounds in your environment. This may be most noticeable when you do not respond to startling sounds.  Needing to turn up the volume on the television, radio, etc.  Ringing in the ears.  Dizziness.  Pain in the ears. How is this diagnosed? This condition is diagnosed based on a physical exam and a hearing test (audiometry). The audiometry test will be performed by a hearing specialist (audiologist). You may also be referred to an ear, nose, and throat (ENT) specialist (otolaryngologist). How is this treated? Treatment for recent onset of hearing loss may include:  Ear wax removal.  Being prescribed medicines to prevent infection (antibiotics).  Being prescribed medicines to reduce inflammation (corticosteroids). Follow these instructions at home:  If you were prescribed an antibiotic medicine, take it as told by your health care provider. Do not stop taking the antibiotic even if you start to feel better.  Take over-the-counter and prescription medicines only as told by your health care provider.  Avoid loud noises.  Return to your normal activities as told by your health care provider. Ask your health care provider what activities are safe for you.  Keep all follow-up visits as told by your health care provider. This is important. Contact a health care provider if:  You feel dizzy.  You develop new symptoms.  You vomit or feel nauseous.  You have a fever. Get help right away if:  You develop sudden changes in your vision.  You have severe ear pain.  You have new or increased weakness.  You have a severe headache. This information is not intended to replace advice given to you by  your health care provider. Make sure you discuss any questions you have with your health care provider. Document Released: 04/17/2005 Document Revised: 09/23/2015 Document Reviewed: 09/02/2014 Elsevier Interactive Patient Education  2019 [employer].

## 2018-07-15 ENCOUNTER — Telehealth: Payer: Self-pay | Admitting: Family Medicine

## 2018-07-15 ENCOUNTER — Other Ambulatory Visit: Payer: Self-pay | Admitting: Family Medicine

## 2018-07-15 MED ORDER — LISINOPRIL 5 MG PO TABS: 5.0000 mg | ORAL_TABLET | Freq: Every day | ORAL | 1 refills | Status: DC

## 2018-07-15 NOTE — Telephone Encounter (Signed)
Copied from [place] [id number]. Topic: Quick Communication - Rx Refill/Question >> Jul 15, 2018  1:03 PM Blase Mess A wrote: Medication: lisinopril (PRINIVIL,ZESTRIL) 5 MG tablet [[id number]]   Has the patient contacted their pharmacy? Yes (Agent: If no, request that the patient contact the pharmacy for the refill.) (Agent: If yes, when and what did the pharmacy advise?)  Preferred Pharmacy (with phone number or street name): [place], [place] [phone] (Phone) [phone] (Fax)    Agent: Please be advised that RX refills may take up to 3 business days. We ask that you follow-up with your pharmacy.

## 2018-07-15 NOTE — Telephone Encounter (Signed)
Noted  

## 2018-07-15 NOTE — Telephone Encounter (Signed)
Last filled 06/26/17 by Dr. Sherren Mocha Last OV 07/10/18  Ok to fill?

## 2018-07-15 NOTE — Telephone Encounter (Signed)
I have refilled this

## 2018-07-17 ENCOUNTER — Ambulatory Visit: Payer: Medicare HMO | Admitting: Family Medicine

## 2018-09-06 DIAGNOSIS — H43811 Vitreous degeneration, right eye: Secondary | ICD-10-CM | POA: Diagnosis not present

## 2018-09-06 DIAGNOSIS — H34832 Tributary (branch) retinal vein occlusion, left eye, with macular edema: Secondary | ICD-10-CM | POA: Diagnosis not present

## 2018-09-10 ENCOUNTER — Telehealth: Payer: Self-pay | Admitting: *Deleted

## 2018-09-10 NOTE — Telephone Encounter (Signed)
[employer] faxed a refill request for Fluiticasone Propionate 50mg .  Message sent to Dr Ethlyn Gallery as last Rx was given by Dr Sherren Mocha.

## 2018-09-11 ENCOUNTER — Other Ambulatory Visit: Payer: Self-pay | Admitting: Family Medicine

## 2018-09-11 MED ORDER — FLUTICASONE PROPIONATE 50 MCG/ACT NA SUSP: 1.0000 | NASAL | 5 refills | Status: DC

## 2018-09-11 NOTE — Telephone Encounter (Signed)
Refill completed.

## 2018-10-08 DIAGNOSIS — D485 Neoplasm of uncertain behavior of skin: Secondary | ICD-10-CM | POA: Diagnosis not present

## 2018-10-08 DIAGNOSIS — L821 Other seborrheic keratosis: Secondary | ICD-10-CM | POA: Diagnosis not present

## 2018-10-08 DIAGNOSIS — L812 Freckles: Secondary | ICD-10-CM | POA: Diagnosis not present

## 2018-10-08 DIAGNOSIS — Z85828 Personal history of other malignant neoplasm of skin: Secondary | ICD-10-CM | POA: Diagnosis not present

## 2018-10-08 DIAGNOSIS — D1801 Hemangioma of skin and subcutaneous tissue: Secondary | ICD-10-CM | POA: Diagnosis not present

## 2018-10-08 DIAGNOSIS — L570 Actinic keratosis: Secondary | ICD-10-CM | POA: Diagnosis not present

## 2018-10-08 DIAGNOSIS — C44722 Squamous cell carcinoma of skin of right lower limb, including hip: Secondary | ICD-10-CM | POA: Diagnosis not present

## 2018-10-09 DIAGNOSIS — G4733 Obstructive sleep apnea (adult) (pediatric): Secondary | ICD-10-CM | POA: Diagnosis not present

## 2018-10-16 DIAGNOSIS — Z96651 Presence of right artificial knee joint: Secondary | ICD-10-CM | POA: Diagnosis not present

## 2018-11-20 ENCOUNTER — Other Ambulatory Visit: Payer: Self-pay | Admitting: Cardiovascular Disease

## 2018-11-25 ENCOUNTER — Other Ambulatory Visit: Payer: Self-pay

## 2018-11-25 DIAGNOSIS — Z20822 Contact with and (suspected) exposure to covid-19: Secondary | ICD-10-CM

## 2018-11-25 DIAGNOSIS — R6889 Other general symptoms and signs: Secondary | ICD-10-CM | POA: Diagnosis not present

## 2018-11-26 LAB — NOVEL CORONAVIRUS, NAA: SARS-CoV-2, NAA: NOT DETECTED

## 2018-12-05 DIAGNOSIS — H34832 Tributary (branch) retinal vein occlusion, left eye, with macular edema: Secondary | ICD-10-CM | POA: Diagnosis not present

## 2018-12-05 DIAGNOSIS — H43813 Vitreous degeneration, bilateral: Secondary | ICD-10-CM | POA: Diagnosis not present

## 2018-12-05 DIAGNOSIS — H35033 Hypertensive retinopathy, bilateral: Secondary | ICD-10-CM | POA: Diagnosis not present

## 2018-12-05 DIAGNOSIS — H35361 Drusen (degenerative) of macula, right eye: Secondary | ICD-10-CM | POA: Diagnosis not present

## 2018-12-24 DIAGNOSIS — D485 Neoplasm of uncertain behavior of skin: Secondary | ICD-10-CM | POA: Diagnosis not present

## 2018-12-24 DIAGNOSIS — C44729 Squamous cell carcinoma of skin of left lower limb, including hip: Secondary | ICD-10-CM | POA: Diagnosis not present

## 2018-12-24 DIAGNOSIS — L570 Actinic keratosis: Secondary | ICD-10-CM | POA: Diagnosis not present

## 2018-12-24 DIAGNOSIS — Z85828 Personal history of other malignant neoplasm of skin: Secondary | ICD-10-CM | POA: Diagnosis not present

## 2019-01-10 ENCOUNTER — Other Ambulatory Visit (INDEPENDENT_AMBULATORY_CARE_PROVIDER_SITE_OTHER): Payer: Medicare HMO

## 2019-01-10 ENCOUNTER — Other Ambulatory Visit: Payer: Self-pay

## 2019-01-10 DIAGNOSIS — R739 Hyperglycemia, unspecified: Secondary | ICD-10-CM

## 2019-01-10 DIAGNOSIS — I10 Essential (primary) hypertension: Secondary | ICD-10-CM

## 2019-01-10 DIAGNOSIS — E785 Hyperlipidemia, unspecified: Secondary | ICD-10-CM

## 2019-01-10 LAB — COMPREHENSIVE METABOLIC PANEL
ALT: 23 U/L (ref 0–53)
AST: 19 U/L (ref 0–37)
Albumin: 3.8 g/dL (ref 3.5–5.2)
Alkaline Phosphatase: 59 U/L (ref 39–117)
BUN: 34 mg/dL — ABNORMAL HIGH (ref 6–23)
CO2: 26 mEq/L (ref 19–32)
Calcium: 8.5 mg/dL (ref 8.4–10.5)
Chloride: 107 mEq/L (ref 96–112)
Creatinine, Ser: 1.16 mg/dL (ref 0.40–1.50)
GFR: 60.45 mL/min (ref >60.00)
Glucose, Bld: 109 mg/dL — ABNORMAL HIGH (ref 70–99)
Potassium: 4.4 mEq/L (ref 3.5–5.1)
Sodium: 139 mEq/L (ref 135–145)
Total Bilirubin: 0.8 mg/dL (ref 0.2–1.2)
Total Protein: 6.2 g/dL (ref 6.0–8.3)

## 2019-01-10 LAB — LIPID PANEL
Cholesterol: 166 mg/dL (ref 0–200)
HDL: 37.6 mg/dL — ABNORMAL LOW (ref >39.00)
LDL Cholesterol: 103 mg/dL — ABNORMAL HIGH (ref 0–99)
NonHDL: 128.21
Total CHOL/HDL Ratio: 4
Triglycerides: 128 mg/dL (ref 0.0–149.0)
VLDL: 25.6 mg/dL (ref 0.0–40.0)

## 2019-01-10 LAB — POC URINALSYSI DIPSTICK (AUTOMATED)
Bilirubin, UA: NEGATIVE
Blood, UA: NEGATIVE
Glucose, UA: NEGATIVE
Ketones, UA: NEGATIVE
Leukocytes, UA: NEGATIVE
Nitrite, UA: NEGATIVE
Protein, UA: POSITIVE — AB
Spec Grav, UA: 1.025 (ref 1.010–1.025)
Urobilinogen, UA: 0.2 E.U./dL
pH, UA: 5.5 (ref 5.0–8.0)

## 2019-01-10 LAB — CBC WITH DIFFERENTIAL/PLATELET
Basophils Absolute: 0 10*3/uL (ref 0.0–0.1)
Basophils Relative: 0.5 % (ref 0.0–3.0)
Eosinophils Absolute: 0.1 10*3/uL (ref 0.0–0.7)
Eosinophils Relative: 2.4 % (ref 0.0–5.0)
HCT: 44 % (ref 39.0–52.0)
Hemoglobin: 15.1 g/dL (ref 13.0–17.0)
Lymphocytes Relative: 22 % (ref 12.0–46.0)
Lymphs Abs: 1.3 10*3/uL (ref 0.7–4.0)
MCHC: 34.4 g/dL (ref 30.0–36.0)
MCV: 97.1 fl (ref 78.0–100.0)
Monocytes Absolute: 0.7 10*3/uL (ref 0.1–1.0)
Monocytes Relative: 10.9 % (ref 3.0–12.0)
Neutro Abs: 3.8 10*3/uL (ref 1.4–7.7)
Neutrophils Relative %: 64.2 % (ref 43.0–77.0)
Platelets: 138 10*3/uL — ABNORMAL LOW (ref 150.0–400.0)
RBC: 4.53 Mil/uL (ref 4.22–5.81)
RDW: 13 % (ref 11.5–15.5)
WBC: 6 10*3/uL (ref 4.0–10.5)

## 2019-01-10 LAB — HEMOGLOBIN A1C: Hgb A1c MFr Bld: 6.1 % (ref 4.6–6.5)

## 2019-01-17 ENCOUNTER — Encounter: Payer: Self-pay | Admitting: Family Medicine

## 2019-01-17 ENCOUNTER — Other Ambulatory Visit: Payer: Self-pay

## 2019-01-17 ENCOUNTER — Ambulatory Visit (INDEPENDENT_AMBULATORY_CARE_PROVIDER_SITE_OTHER): Payer: Medicare HMO | Admitting: Family Medicine

## 2019-01-17 VITALS — BP 98/64 | HR 52 | Temp 97.3°F | Ht 75.0 in | Wt 238.6 lb

## 2019-01-17 DIAGNOSIS — I10 Essential (primary) hypertension: Secondary | ICD-10-CM | POA: Diagnosis not present

## 2019-01-17 DIAGNOSIS — R7301 Impaired fasting glucose: Secondary | ICD-10-CM | POA: Diagnosis not present

## 2019-01-17 DIAGNOSIS — E7800 Pure hypercholesterolemia, unspecified: Secondary | ICD-10-CM | POA: Diagnosis not present

## 2019-01-17 DIAGNOSIS — G4733 Obstructive sleep apnea (adult) (pediatric): Secondary | ICD-10-CM

## 2019-01-17 DIAGNOSIS — N400 Enlarged prostate without lower urinary tract symptoms: Secondary | ICD-10-CM | POA: Diagnosis not present

## 2019-01-17 DIAGNOSIS — I483 Typical atrial flutter: Secondary | ICD-10-CM | POA: Diagnosis not present

## 2019-01-17 DIAGNOSIS — Z23 Encounter for immunization: Secondary | ICD-10-CM | POA: Diagnosis not present

## 2019-01-17 DIAGNOSIS — L719 Rosacea, unspecified: Secondary | ICD-10-CM | POA: Diagnosis not present

## 2019-01-17 MED ORDER — SIMVASTATIN 20 MG PO TABS: 20.0000 mg | ORAL_TABLET | Freq: Every day | ORAL | 4 refills | Status: DC

## 2019-01-17 MED ORDER — FLUTICASONE PROPIONATE 50 MCG/ACT NA SUSP: 1.0000 | NASAL | 5 refills | Status: DC

## 2019-01-17 MED ORDER — TAMSULOSIN HCL 0.4 MG PO CAPS: 0.4000 mg | ORAL_CAPSULE | Freq: Every day | ORAL | 3 refills | Status: DC

## 2019-01-17 MED ORDER — LISINOPRIL 5 MG PO TABS: 5.0000 mg | ORAL_TABLET | Freq: Every day | ORAL | 3 refills | Status: DC

## 2019-01-17 NOTE — Addendum Note (Signed)
Addended by: Agnes Lawrence on: 01/17/2019 09:50 AM   Modules accepted: Orders

## 2019-01-17 NOTE — Progress Notes (Signed)
Gary [name] DOB: 1936/04/07 Encounter date: 01/17/2019  This is a 83 y.o. male who presents with Chief Complaint  Patient presents with  . Follow-up    History of present illness: A flutter: follows with Dr. Sallyanne Kuster. On eliquis.   HTN: does check bp at home.  Has been checking bp at home and getting readings 118-139/60-77. Not light headed or dizzy. Hasn't seen any as low as today. Didn't take lisinopril today. Was checking at home mid morning. Usually home readings were after lisinopril. Started with lisinopril 10mg  but it was decreased due to lower readings at home.   Allergic rhinitis:uses flonase. Not having any current allergy issues. Not much different this season.   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   OSA: uses cpap every night. Makes a good improvement for him.   Works out with trainer 2 days/week. Has been able to continue this through [place].   No Known Allergies Current Meds  Medication Sig  . co-enzyme Q-10 30 MG capsule Take 30 mg by mouth every other day.   [name] ELIQUIS 5 MG TABS tablet TAKE 1 TABLET TWICE DAILY (NEED MD APPOINTMENT)  . fluticasone (FLONASE) 50 MCG/ACT nasal spray Place 1 spray into both nostrils every other day.  . lisinopril (PRINIVIL,ZESTRIL) 5 MG tablet Take 1 tablet (5 mg total) by mouth daily.  . minocycline (MINOCIN,DYNACIN) 100 MG capsule minocycline 100 mg capsule  . Multiple Vitamin (MULTIVITAMIN) tablet Take 1 tablet by mouth daily.  . simvastatin (ZOCOR) 20 MG tablet Take 1 tablet (20 mg total) by mouth at bedtime. (Patient taking differently: Take 20 mg by mouth every other day. )   Current Facility-Administered Medications for the 01/17/19 encounter (Office Visit) with Caren Macadam, MD  Medication  . 0.9 %  sodium chloride infusion    Review of Systems  Constitutional: Negative for chills, fatigue and fever.  Respiratory: Negative for cough, chest tightness, shortness of  breath and wheezing.   Cardiovascular: Negative for chest pain, palpitations and leg swelling.  Genitourinary: Difficulty urinating: stream is slower. Frequency: twice at night; feels like more freqeunt during day.    Objective:  BP 98/64 (BP Location: Left Arm, Patient Position: Sitting, Cuff Size: Large)   Pulse (!) 52   Temp (!) 97.3 F (36.3 C) (Temporal)   Ht 6\' 3"  (1.905 m)   Wt 238 lb 9.6 oz (108.2 kg)   SpO2 98%   BMI 29.82 kg/m   Weight: 238 lb 9.6 oz (108.2 kg)   BP Readings from Last 3 Encounters:  01/17/19 98/64  07/10/18 112/62  07/10/18 112/62   Wt Readings from Last 3 Encounters:  01/17/19 238 lb 9.6 oz (108.2 kg)  07/10/18 246 lb (111.6 kg)  07/10/18 246 lb (111.6 kg)    Physical Exam Constitutional:      General: He is not in acute distress.    Appearance: He is well-developed.  Cardiovascular:     Rate and Rhythm: Normal rate and regular rhythm.     Heart sounds: Normal heart sounds. No murmur. No friction rub.  Pulmonary:     Effort: Pulmonary effort is normal. No respiratory distress.     Breath sounds: Normal breath sounds. No wheezing or rales.  Abdominal:     General: Abdomen is flat. Bowel sounds are normal. There is no distension.     Tenderness: There is no abdominal tenderness. There is no guarding.  Genitourinary:    Prostate: Enlarged (  5.5 cm).     Rectum: Normal.  Musculoskeletal:     Right lower leg: No edema.     Left lower leg: No edema.  Neurological:     Mental Status: He is alert and oriented to person, place, and time.  Psychiatric:        Behavior: Behavior normal.     Assessment/Plan  1. Typical atrial flutter ([place]) Follows with cardiology. On eliquis.   2. Essential hypertension Low today. Monitor at home. Check home cuff against cardiologists at next visit.  - CBC with Differential/Platelet; Future  3. Obstructive sleep apnea Continue with cpap.   4. Impaired fasting glucose Stable. Keep up with healthy  eating, regular exercise.  - Hemoglobin A1c; Future  5. Hypercholesterolemia Continue with statin.  - Comprehensive metabolic panel; Future - Lipid panel; Future  6. Rosacea Following with derm.  7. Enlarged prostate Trial flomax to help with complete emptying and with slower stream.  - PSA; Future - tamsulosin (FLOMAX) 0.4 MG CAPS capsule; Take 1 capsule (0.4 mg total) by mouth daily.  Dispense: 30 capsule; Refill: 3   Return in about 6 months (around 07/17/2019) for bloodwork followed by [facility].    [name], MD

## 2019-01-17 NOTE — Patient Instructions (Signed)
Bring home cuff to cardiology visit with you.   If pressures at home are [id number] or less systolic, hold lisinopril.

## 2019-02-06 DIAGNOSIS — G4733 Obstructive sleep apnea (adult) (pediatric): Secondary | ICD-10-CM | POA: Diagnosis not present

## 2019-03-01 ENCOUNTER — Other Ambulatory Visit: Payer: Self-pay | Admitting: Cardiovascular Disease

## 2019-03-04 NOTE — Telephone Encounter (Signed)
82 M 108.2 kg, SCr 1.16 (9/20) LOV 11/19 [place]

## 2019-03-06 DIAGNOSIS — H3582 Retinal ischemia: Secondary | ICD-10-CM | POA: Diagnosis not present

## 2019-03-06 DIAGNOSIS — H43813 Vitreous degeneration, bilateral: Secondary | ICD-10-CM | POA: Diagnosis not present

## 2019-03-06 DIAGNOSIS — H34832 Tributary (branch) retinal vein occlusion, left eye, with macular edema: Secondary | ICD-10-CM | POA: Diagnosis not present

## 2019-03-06 DIAGNOSIS — H35033 Hypertensive retinopathy, bilateral: Secondary | ICD-10-CM | POA: Diagnosis not present

## 2019-03-18 ENCOUNTER — Ambulatory Visit (INDEPENDENT_AMBULATORY_CARE_PROVIDER_SITE_OTHER): Payer: Medicare HMO | Admitting: Cardiovascular Disease

## 2019-03-18 ENCOUNTER — Encounter: Payer: Self-pay | Admitting: Cardiovascular Disease

## 2019-03-18 ENCOUNTER — Other Ambulatory Visit: Payer: Self-pay

## 2019-03-18 VITALS — BP 142/72 | HR 61 | Temp 97.3°F | Ht 75.0 in | Wt 246.0 lb

## 2019-03-18 DIAGNOSIS — I483 Typical atrial flutter: Secondary | ICD-10-CM

## 2019-03-18 DIAGNOSIS — E7800 Pure hypercholesterolemia, unspecified: Secondary | ICD-10-CM

## 2019-03-18 DIAGNOSIS — G4733 Obstructive sleep apnea (adult) (pediatric): Secondary | ICD-10-CM

## 2019-03-18 DIAGNOSIS — Z7901 Long term (current) use of anticoagulants: Secondary | ICD-10-CM

## 2019-03-18 NOTE — Patient Instructions (Signed)

## 2019-03-18 NOTE — Progress Notes (Signed)
Cardiology Office Note    Date:  03/20/2019   ID:  Gary [name], DOB Jun 29, 1935, MRN [id number]  PCP:  Caren Macadam, MD  Cardiologist:   [name], MD   Chief Complaint  Patient presents with  . Follow-up    12 months.  Atrial flutter  History of Present Illness:  Gary [name] is a 83 y.o. male with asymptomatic atrial flutter with spontaneously controlled ventricular rate, recurrent following previous cardioversion without significant structural heart disease. He returns for routine follow-up.   He continues to exercise twice a week with a personal trainer and generally remains active.  He reports compliance with CPAP.  The patient specifically denies any chest pain at rest exertion, dyspnea at rest or with exertion, orthopnea, paroxysmal nocturnal dyspnea, syncope, palpitations, focal neurological deficits, intermittent claudication, lower extremity edema, unexplained weight gain, cough, hemoptysis or wheezing.  He underwent uncomplicated total right knee replacement and colonoscopy last year.  On both occasions he had brief interruption of his anticoagulants, uneventfully.  Previous workup with echocardiography shows aortic valve sclerosis without stenosis and normal left ventricular function. He has a markedly dilated left atrium. He has obstructive sleep apnea and is compliant with CPAP.  Past Medical History:  Diagnosis Date  . Adenomatous colon polyp 06/2000  . ALLERGIC RHINITIS 10/03/2007  . Allergy    seasonal  . Arthritis    knees  . Atrial flutter ([place]) 06/26/2014  . Cancer ([place])    skin cancers removed in the past basal cell and squamous cell  . CARDIAC MURMUR, AORTIC 10/03/2007  . ERECTILE DYSFUNCTION 10/03/2007  . High blood pressure 03/2016  . HYPERLIPIDEMIA 10/03/2007  . Rosacea   . Sleep apnea     Past Surgical History:  Procedure Laterality Date  . ADENOIDECTOMY    . CARDIOVERSION N/A 07/01/2014   Procedure: CARDIOVERSION;  Surgeon: [name], MD;  Location: [facility];  Service: Cardiovascular;  Laterality: N/A;  . COLONOSCOPY    . PALATE SURGERY    . TEE WITHOUT CARDIOVERSION N/A 07/01/2014   Procedure: TRANSESOPHAGEAL ECHOCARDIOGRAM (TEE);  Surgeon: [name], MD;  Location: [facility] ENDOSCOPY;  Service: Cardiovascular;  Laterality: N/A;  . TONSILLECTOMY    . TOTAL KNEE ARTHROPLASTY Right 08/27/2017   Procedure: RIGHT TOTAL KNEE ARTHROPLASTY;  Surgeon: Vickey Huger, MD;  Location: [facility];  Service: Orthopedics;  Laterality: Right;    Current Medications: Outpatient Medications Prior to Visit  Medication Sig Dispense Refill  . apixaban (ELIQUIS) 5 MG TABS tablet Take 1 tablet (5 mg total) by mouth 2 (two) times daily. 180 tablet 0  . co-enzyme Q-10 30 MG capsule Take 30 mg by mouth every other day.     . fluticasone (FLONASE) 50 MCG/ACT nasal spray Place 1 spray into both nostrils every other day. 32 g 5  . lisinopril (ZESTRIL) 5 MG tablet Take 1 tablet (5 mg total) by mouth daily. 90 tablet 3  . minocycline (MINOCIN,DYNACIN) 100 MG capsule minocycline 100 mg capsule    . Multiple Vitamin (MULTIVITAMIN) tablet Take 1 tablet by mouth daily.    . simvastatin (ZOCOR) 20 MG tablet Take 1 tablet (20 mg total) by mouth at bedtime. 90 tablet 4  . tamsulosin (FLOMAX) 0.4 MG CAPS capsule Take 1 capsule (0.4 mg total) by mouth daily. 30 capsule 3   Facility-Administered Medications Prior to Visit  Medication Dose Route Frequency Provider Last Rate Last Dose  . 0.9 %  sodium chloride infusion  500 mL Intravenous Once  Ladene Artist, MD         Allergies:   Patient has no known allergies.   Social History   Socioeconomic History  . Marital status: Married    Spouse name: Not on file  . Number of children: 2  . Years of education: Not on file  . Highest education level: Not on file  Occupational History  . 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 Financial resource strain: Not  hard at all  . Food insecurity    Worry: Never true    Inability: Never true  . Transportation needs    Medical: No    Non-medical: No  Tobacco Use  . Smoking status: Former Research scientist (life sciences)  . Smokeless tobacco: Never Used  Substance and Sexual Activity  . Alcohol use: Yes    Alcohol/week: 8.0 standard drinks    Types: 8 Glasses of wine per week    Comment: ocassionally   . Drug use: No  . Sexual activity: Not on file  Lifestyle  . Physical activity    Days per week: 2 days    Minutes per session: 30 min  . Stress: Not at all  Relationships  . Social Herbalist on phone: Twice a week    Gets together: Twice a week    Attends religious service: Never    Active member of club or organization: Yes    Attends meetings of clubs or organizations: More than 4 times per year    Relationship status: Married  Other Topics Concern  . Not on file  Social History Narrative   Lives with wife in two story house, but occupies main level mostly   Has 4 children, two biological, two stepchildren, 9 grandkids   One son local, supportive    Active playing golf with friends, goes to Physiological scientist 2 days/week at [employer]   Enjoys reading, soduku     Family History:  The patient's family history includes CAD in his brother; Cancer in his father; Diabetes in his mother; Hypotension in his sister; Liver cancer in his father; Stomach cancer in his paternal aunt.   ROS:   Please see the history of present illness.    ROS All other systems reviewed and are negative.   PHYSICAL EXAM:   VS:  BP (!) 142/72 (BP Location: Left Arm, Patient Position: Sitting, Cuff Size: Normal)   Pulse 61   Temp (!) 97.3 F (36.3 C)   Ht 6\' 3"  (1.905 m)   Wt 246 lb (111.6 kg)   BMI 30.75 kg/m      General: Alert, oriented x3, no distress, rhinophyma Head: no evidence of trauma, PERRL, EOMI, no exophtalmos or lid lag, no myxedema, no xanthelasma; normal ears, nose and oropharynx Neck: normal  jugular venous pulsations and no hepatojugular reflux; brisk carotid pulses without delay and no carotid bruits Chest: clear to auscultation, no signs of consolidation by percussion or palpation, normal fremitus, symmetrical and full respiratory excursions Cardiovascular: normal position and quality of the apical impulse, regular rhythm, normal first and second heart sounds, no murmurs, rubs or gallops Abdomen: no tenderness or distention, no masses by palpation, no abnormal pulsatility or arterial bruits, normal bowel sounds, no hepatosplenomegaly Extremities: no clubbing, cyanosis or edema; 2+ radial, ulnar and brachial pulses bilaterally; 2+ right femoral, posterior tibial and dorsalis pedis pulses; 2+ left femoral, posterior tibial and dorsalis pedis pulses; no subclavian or femoral bruits Neurological:  grossly nonfocal Psych: Normal mood and affect   Wt Readings from Last 3 Encounters:  03/18/19 246 lb (111.6 kg)  01/17/19 238 lb 9.6 oz (108.2 kg)  07/10/18 246 lb (111.6 kg)      Studies/Labs Reviewed:   EKG:  EKG is ordered today.  The ekg ordered today demonstrates typical counterclockwise atrial flutter with 4: 1 AV block.  QTc interval 428 ms  Recent Labs: 04/12/2018: Pro B Natriuretic peptide (BNP) 75.0 01/10/2019: ALT 23; BUN 34; Creatinine, Ser 1.16; Hemoglobin 15.1; Platelets 138.0; Potassium 4.4; Sodium 139   Lipid Panel    Component Value Date/Time   CHOL 136 05/24/2015 0828   TRIG 78.0 05/24/2015 0828   HDL 41.30 05/24/2015 0828   CHOLHDL 3 05/24/2015 0828   VLDL 15.6 05/24/2015 0828   LDLCALC 79 05/24/2015 0828    LDLDIRECT  143.5  01/22/2012 0916   01/10/2019 Total cholesterol 166, HDL 37, LDL 103, triglycerides 128 Hemoglobin A1c 6.1%, hemoglobin 15.1, creatinine 1.16, potassium 4.4, normal liver function test  ASSESSMENT:    1. Typical atrial flutter ([place])   2. Long term current use of anticoagulant   3. OSA (obstructive sleep apnea)   4.  Hypercholesterolemia      PLAN:  In order of problems listed above:  1. AFlutter: Asymptomatic and spontaneously rate controlled.  He might require pacemaker therapy in the future if he has worsening of AV block. CHADSVasc 3 (age, HTN).  Appropriately anticoagulated 2. Eliquis: He has not had embolic events and has not had serious bleeding problems. 3. OSA: Compliant with CPAP and symptoms well controlled. 4. HLP: Target LDL cholesterol 100 or less (very close to that).  He has not have known coronary or peripheral vascular disease.   Medication Adjustments/Labs and Tests Ordered: Current medicines are reviewed at length with the patient today.  Concerns regarding medicines are outlined above.  Medication changes, Labs and Tests ordered today are listed in the Patient Instructions below. Patient Instructions  Medication Instructions:  No changes *If you need a refill on your cardiac medications before your next appointment, please call your pharmacy*  Lab Work: None ordered If you have labs (blood work) drawn today and your tests are completely normal, you will receive your results only by: [name] MyChart Message (if you have MyChart) OR . A paper copy in the mail If you have any lab test that is abnormal or we need to change your treatment, we will call you to review the results.  Testing/Procedures: None ordered  Follow-Up: At [facility], you and your health needs are our priority.  As part of our continuing mission to provide you with exceptional heart care, we have created designated Provider Care Teams.  These Care Teams include your primary Cardiologist (physician) and Advanced Practice Providers (APPs -  Physician Assistants and Nurse Practitioners) who all work together to provide you with the care you need, when you need it.  Your next appointment:   12 months  The format for your next appointment:   In Person  Provider:   [name], MD     Signed, [name], MD  03/20/2019 3:52 PM    [facility] [place], [place], [place]  [address] Phone: [phone]; Fax: [phone]        The MRI is scheduled at [facility] 11/18 at [place].

## 2019-04-10 DIAGNOSIS — L821 Other seborrheic keratosis: Secondary | ICD-10-CM | POA: Diagnosis not present

## 2019-04-10 DIAGNOSIS — L814 Other melanin hyperpigmentation: Secondary | ICD-10-CM | POA: Diagnosis not present

## 2019-04-10 DIAGNOSIS — L820 Inflamed seborrheic keratosis: Secondary | ICD-10-CM | POA: Diagnosis not present

## 2019-04-10 DIAGNOSIS — L570 Actinic keratosis: Secondary | ICD-10-CM | POA: Diagnosis not present

## 2019-04-10 DIAGNOSIS — Z85828 Personal history of other malignant neoplasm of skin: Secondary | ICD-10-CM | POA: Diagnosis not present

## 2019-04-10 DIAGNOSIS — D485 Neoplasm of uncertain behavior of skin: Secondary | ICD-10-CM | POA: Diagnosis not present

## 2019-04-10 DIAGNOSIS — C44311 Basal cell carcinoma of skin of nose: Secondary | ICD-10-CM | POA: Diagnosis not present

## 2019-04-17 ENCOUNTER — Other Ambulatory Visit: Payer: Self-pay | Admitting: Physician Assistant

## 2019-04-17 ENCOUNTER — Encounter: Payer: Self-pay | Admitting: Physician Assistant

## 2019-04-17 ENCOUNTER — Telehealth: Payer: Self-pay | Admitting: Family Medicine

## 2019-04-17 ENCOUNTER — Ambulatory Visit (INDEPENDENT_AMBULATORY_CARE_PROVIDER_SITE_OTHER): Payer: Medicare HMO | Admitting: Physician Assistant

## 2019-04-17 ENCOUNTER — Other Ambulatory Visit: Payer: Self-pay

## 2019-04-17 VITALS — BP 142/80 | HR 64 | Ht 75.0 in | Wt 249.6 lb

## 2019-04-17 DIAGNOSIS — E785 Hyperlipidemia, unspecified: Secondary | ICD-10-CM

## 2019-04-17 DIAGNOSIS — R0789 Other chest pain: Secondary | ICD-10-CM

## 2019-04-17 DIAGNOSIS — I10 Essential (primary) hypertension: Secondary | ICD-10-CM

## 2019-04-17 DIAGNOSIS — I483 Typical atrial flutter: Secondary | ICD-10-CM

## 2019-04-17 MED ORDER — PANTOPRAZOLE SODIUM 40 MG PO TBEC: 40.0000 mg | DELAYED_RELEASE_TABLET | Freq: Every day | ORAL | 0 refills | Status: DC

## 2019-04-17 MED ORDER — PANTOPRAZOLE SODIUM 40 MG PO TBEC: 40.0000 mg | DELAYED_RELEASE_TABLET | Freq: Every day | ORAL | 2 refills | Status: DC

## 2019-04-17 NOTE — Progress Notes (Signed)
Cardiology Office Note:    Date:  04/19/2019   ID:  Gary [name], DOB Aug 31, 1935, MRN [id number]  PCP:  Caren Macadam, MD  Cardiologist:  [name], MD  Electrophysiologist:  None   Referring MD: Caren Macadam, MD   Chief Complaint  Patient presents with  . Follow-up    seen for Dr. Sallyanne Kuster    History of Present Illness:    Gary [name] is a 83 y.o. male with a hx of atrial flutter, hypertension, hyperlipidemia, OSA and history of heart murmur.  Last TEE obtained on 07/01/2014 showed EF 55 to 60%, mild MR. He subsequently had a cardioversion on the same day.  Previous carotid Doppler obtained on 07/27/2016 showed mild bilateral carotid artery disease.  He was last seen by Dr. Sallyanne Kuster in November 2020 at which time his symptom was well controlled.  Patient presents today for evaluation of palpitation and chest pain.  In the past 4 to 5 days, he has been noticing increased palpitation.  Symptom only occur for a few seconds before going away.  I suspect he is having PVCs on top of the atrial flutter.  EKG today showed rate controlled atrial flutter which is essentially permanent at this point.  He also complained of chest burning sensation when he is at rest, this does not occur with physical exertion.  I recommended a trial of Protonix 40 mg daily.  I will bring the patient back in 2 weeks, if symptoms still persist I will proceed with a Myoview at that time.   Past Medical History:  Diagnosis Date  . Adenomatous colon polyp 06/2000  . ALLERGIC RHINITIS 10/03/2007  . Allergy    seasonal  . Arthritis    knees  . Atrial flutter ([place]) 06/26/2014  . Cancer ([place])    skin cancers removed in the past basal cell and squamous cell  . CARDIAC MURMUR, AORTIC 10/03/2007  . ERECTILE DYSFUNCTION 10/03/2007  . High blood pressure 03/2016  . HYPERLIPIDEMIA 10/03/2007  . Rosacea   . Sleep apnea     Past Surgical History:  Procedure Laterality Date  . ADENOIDECTOMY    .  CARDIOVERSION N/A 07/01/2014   Procedure: CARDIOVERSION;  Surgeon: [name], MD;  Location: [facility] ENDOSCOPY;  Service: Cardiovascular;  Laterality: N/A;  . COLONOSCOPY    . PALATE SURGERY    . TEE WITHOUT CARDIOVERSION N/A 07/01/2014   Procedure: TRANSESOPHAGEAL ECHOCARDIOGRAM (TEE);  Surgeon: [name], MD;  Location: [facility] ENDOSCOPY;  Service: Cardiovascular;  Laterality: N/A;  . TONSILLECTOMY    . TOTAL KNEE ARTHROPLASTY Right 08/27/2017   Procedure: RIGHT TOTAL KNEE ARTHROPLASTY;  Surgeon: Vickey Huger, MD;  Location: [place];  Service: Orthopedics;  Laterality: Right;    Current Medications: Current Meds  Medication Sig  . apixaban (ELIQUIS) 5 MG TABS tablet Take 1 tablet (5 mg total) by mouth 2 (two) times daily.  [name] co-enzyme Q-10 30 MG capsule Take 30 mg by mouth every other day.   . fluticasone (FLONASE) 50 MCG/ACT nasal spray Place 1 spray into both nostrils every other day.  . lisinopril (ZESTRIL) 5 MG tablet Take 1 tablet (5 mg total) by mouth daily.  . minocycline (MINOCIN,DYNACIN) 100 MG capsule minocycline 100 mg capsule  . Multiple Vitamin (MULTIVITAMIN) tablet Take 1 tablet by mouth daily.  . simvastatin (ZOCOR) 20 MG tablet Take 1 tablet (20 mg total) by mouth at bedtime.  . tamsulosin (FLOMAX) 0.4 MG CAPS capsule Take 1 capsule (0.4 mg total) by  mouth daily.   Current Facility-Administered Medications for the 04/17/19 encounter (Office Visit) with [name], PA  Medication  . 0.9 %  sodium chloride infusion     Allergies:   Patient has no known allergies.   Social History   Socioeconomic History  . Marital status: Married    Spouse name: Not on file  . Number of children: 2  . Years of education: Not on file  . Highest education level: Not on file  Occupational History  . Occupation: IT trainer company    Comment: retired  Tobacco Use  . Smoking status: Former Research scientist (life sciences)  . Smokeless tobacco: Never Used  Substance and Sexual Activity  . Alcohol use:  Yes    Alcohol/week: 8.0 standard drinks    Types: 8 Glasses of wine per week    Comment: ocassionally   . Drug use: No  . Sexual activity: Not on file  Other Topics Concern  . Not on file  Social History Narrative   Lives with wife in two story house, but occupies main level mostly   Has 4 children, two biological, two stepchildren, 9 grandkids   One son local, supportive    Active playing golf with friends, goes to Physiological scientist 2 days/week at [employer]   Enjoys reading, soduku   Social Determinants of Health   Financial Resource Strain: Low Risk   . Difficulty of Paying Living Expenses: Not hard at all  Food Insecurity: No Food Insecurity  . Worried About Charity fundraiser in the Last Year: Never true  . Ran Out of Food in the Last Year: Never true  Transportation Needs: No Transportation Needs  . Lack of Transportation (Medical): No  . Lack of Transportation (Non-Medical): No  Physical Activity: Insufficiently Active  . Days of Exercise per Week: 2 days  . Minutes of Exercise per Session: 30 min  Stress: No Stress Concern Present  . Feeling of Stress : Not at all  Social Connections: Slightly Isolated  . Frequency of Communication with Friends and Family: Twice a week  . Frequency of Social Gatherings with Friends and Family: Twice a week  . Attends Religious Services: Never  . Active Member of Clubs or Organizations: Yes  . Attends Archivist Meetings: More than 4 times per year  . Marital Status: Married     Family History: The patient's family history includes CAD in his brother; Cancer in his father; Diabetes in his mother; Hypotension in his sister; Liver cancer in his father; Stomach cancer in his paternal aunt.  ROS:   Please see the history of present illness.     All other systems reviewed and are negative.  EKGs/Labs/Other Studies Reviewed:    The following studies were reviewed today:  Echo 07/01/2014 LV EF: 55% -  60% Study  Conclusions   - Left ventricle: Systolic function was normal. The estimated  ejection fraction was in the range of 55% to 60%. Wall motion was  normal; there were no regional wall motion abnormalities.  - Aortic valve: There was trivial regurgitation.  - Mitral valve: There was mild regurgitation.  - Left atrium: No evidence of thrombus in the atrial cavity or  appendage. The appendage was morphologically a left appendage,  multilobulated, and of normal size. Emptying velocity was mildly  reduced.  - Right atrium: No evidence of thrombus in the atrial cavity or  appendage.   EKG:  EKG is  ordered today.  The ekg ordered today  demonstrates atrial flutter, otherwise no significant ST-T wave changes.  When compared to the previous EKG, this is unchanged.  Recent Labs: 01/10/2019: ALT 23; BUN 34; Creatinine, Ser 1.16; Hemoglobin 15.1; Platelets 138.0; Potassium 4.4; Sodium 139  Recent Lipid Panel    Component Value Date/Time   CHOL 166 01/10/2019 0826   TRIG 128.0 01/10/2019 0826   HDL 37.60 (L) 01/10/2019 0826   CHOLHDL 4 01/10/2019 0826   VLDL 25.6 01/10/2019 0826   LDLCALC 103 (H) 01/10/2019 0826   LDLDIRECT 143.5 01/22/2012 0916    Physical Exam:    VS:  BP (!) 142/80   Pulse 64   Ht 6\' 3"  (1.905 m)   Wt 249 lb 9.6 oz (113.2 kg)   BMI 31.20 kg/m     Wt Readings from Last 3 Encounters:  04/17/19 249 lb 9.6 oz (113.2 kg)  03/18/19 246 lb (111.6 kg)  01/17/19 238 lb 9.6 oz (108.2 kg)     GEN:  Well nourished, well developed in no acute distress HEENT: Normal NECK: No JVD; No carotid bruits LYMPHATICS: No lymphadenopathy CARDIAC: RRR, no murmurs, rubs, gallops RESPIRATORY:  Clear to auscultation without rales, wheezing or rhonchi  ABDOMEN: Soft, non-tender, non-distended MUSCULOSKELETAL:  No edema; No deformity  SKIN: Warm and dry NEUROLOGIC:  Alert and oriented x 3 PSYCHIATRIC:  Normal affect   ASSESSMENT:    1. Atypical chest pain   2. Typical  atrial flutter ([place])   3. Essential hypertension   4. Hyperlipidemia LDL goal <100    PLAN:    In order of problems listed above:  1. Atypical chest pain: Symptom only occurs at rest and does not occur with physical exertion.  I recommend a trial of Protonix and reassessment in 2 weeks.  If symptoms still persist, I likely will consider a Myoview  2. History of atrial flutter: Continue on Eliquis.  He has permanent atrial flutter by this point.  Self rate controlled without any AV nodal blocking agent.  3. Hypertension: Blood pressure stable  4. Hyperlipidemia: On Zocor.   Medication Adjustments/Labs and Tests Ordered: Current medicines are reviewed at length with the patient today.  Concerns regarding medicines are outlined above.  Orders Placed This Encounter  Procedures  . EKG 12-Lead   Meds ordered this encounter  Medications  . pantoprazole (PROTONIX) 40 MG tablet    Sig: Take 1 tablet (40 mg total) by mouth daily.    Dispense:  90 tablet    Refill:  2  . pantoprazole (PROTONIX) 40 MG tablet    Sig: Take 1 tablet (40 mg total) by mouth daily.    Dispense:  30 tablet    Refill:  0    Patient Instructions  Medication Instructions:   START PROTONIX 40 MG DAILY *If you need a refill on your cardiac medications before your next appointment, please call your pharmacy*  Lab Work: NONE ordered at this time of appointment   If you have labs (blood work) drawn today and your tests are completely normal, you will receive your results only by: [name] MyChart Message (if you have MyChart) OR . A paper copy in the mail If you have any lab test that is abnormal or we need to change your treatment, we will call you to review the results.  Testing/Procedures: NONE ordered at this time of appointment   Follow-Up: At [facility], you and your health needs are our priority.  As part of our continuing mission to provide you with exceptional  heart care, we have created designated  Provider Care Teams.  These Care Teams include your primary Cardiologist (physician) and Advanced Practice Providers (APPs -  Physician Assistants and Nurse Practitioners) who all work together to provide you with the care you need, when you need it.  Your next appointment:   2 week(s)  The format for your next appointment:   In Person  Provider:   [name], PA-C  Other Instructions      Signed, [name], [place]  04/19/2019 11:52 PM    [place]

## 2019-04-17 NOTE — Patient Instructions (Signed)
Medication Instructions:   START PROTONIX 40 MG DAILY *If you need a refill on your cardiac medications before your next appointment, please call your pharmacy*  Lab Work: NONE ordered at this time of appointment   If you have labs (blood work) drawn today and your tests are completely normal, you will receive your results only by: [name] MyChart Message (if you have MyChart) OR . A paper copy in the mail If you have any lab test that is abnormal or we need to change your treatment, we will call you to review the results.  Testing/Procedures: NONE ordered at this time of appointment   Follow-Up: At [facility], you and your health needs are our priority.  As part of our continuing mission to provide you with exceptional heart care, we have created designated Provider Care Teams.  These Care Teams include your primary Cardiologist (physician) and Advanced Practice Providers (APPs -  Physician Assistants and Nurse Practitioners) who all work together to provide you with the care you need, when you need it.  Your next appointment:   2 week(s)  The format for your next appointment:   In Person  Provider:   [name], PA-C  Other Instructions

## 2019-04-17 NOTE — Telephone Encounter (Signed)
Copied from [place] [phone]. Topic: General - Other >> Apr 17, 2019 12:28 PM Keene Breath wrote: Reason for CRM: Patient would like an appt. With doctor earlier than Jan. To check out his afib and murmur.  CB# [phone]

## 2019-04-17 NOTE — Telephone Encounter (Signed)
Spoke with the pt and informed him it would be best if he would contact his cardiologist for an appt.  Patient stated he called and was worked in today for an appt at [facility].

## 2019-04-17 NOTE — Telephone Encounter (Signed)
Message Routed to PCP CMA 

## 2019-04-19 ENCOUNTER — Encounter: Payer: Self-pay | Admitting: Physician Assistant

## 2019-04-24 ENCOUNTER — Other Ambulatory Visit: Payer: Self-pay | Admitting: Family Medicine

## 2019-04-24 DIAGNOSIS — N400 Enlarged prostate without lower urinary tract symptoms: Secondary | ICD-10-CM

## 2019-04-24 MED ORDER — TAMSULOSIN HCL 0.4 MG PO CAPS: 0.4000 mg | ORAL_CAPSULE | Freq: Every day | ORAL | 1 refills | Status: DC

## 2019-04-24 NOTE — Telephone Encounter (Signed)
Medication:tamsulosin (FLOMAX) 0.4 MG CAPS capsule [id number]   Has the patient contacted their pharmacy? Yes  (Agent: If no, request that the patient contact the pharmacy for the refill.) (Agent: If yes, when and what did the pharmacy advise?)  Preferred Pharmacy (with phone number or street name): [place], [place]  Phone:  [phone] Fax:  [phone]     Agent: Please be advised that RX refills may take up to 3 business days. We ask that you follow-up with your pharmacy.

## 2019-04-26 DIAGNOSIS — Z20828 Contact with and (suspected) exposure to other viral communicable diseases: Secondary | ICD-10-CM | POA: Diagnosis not present

## 2019-04-30 ENCOUNTER — Other Ambulatory Visit: Payer: Self-pay | Admitting: Cardiovascular Disease

## 2019-05-01 ENCOUNTER — Ambulatory Visit: Payer: Medicare HMO | Admitting: Physician Assistant

## 2019-05-01 ENCOUNTER — Telehealth (INDEPENDENT_AMBULATORY_CARE_PROVIDER_SITE_OTHER): Payer: Medicare HMO | Admitting: Family Medicine

## 2019-05-01 ENCOUNTER — Other Ambulatory Visit: Payer: Self-pay

## 2019-05-01 ENCOUNTER — Encounter: Payer: Self-pay | Admitting: Family Medicine

## 2019-05-01 ENCOUNTER — Ambulatory Visit: Payer: Self-pay | Admitting: *Deleted

## 2019-05-01 VITALS — BP 128/75

## 2019-05-01 DIAGNOSIS — Z20828 Contact with and (suspected) exposure to other viral communicable diseases: Secondary | ICD-10-CM

## 2019-05-01 DIAGNOSIS — Z20822 Contact with and (suspected) exposure to covid-19: Secondary | ICD-10-CM

## 2019-05-01 DIAGNOSIS — R059 Cough, unspecified: Secondary | ICD-10-CM

## 2019-05-01 DIAGNOSIS — R5383 Other fatigue: Secondary | ICD-10-CM

## 2019-05-01 DIAGNOSIS — R05 Cough: Secondary | ICD-10-CM

## 2019-05-01 NOTE — Telephone Encounter (Signed)
Have already spoken with patient.  He has no significant dyspnea at this time and seems to be recovering

## 2019-05-01 NOTE — Progress Notes (Signed)
This visit type was conducted due to national recommendations for restrictions regarding the COVID-19 pandemic in an effort to limit this patient's exposure and mitigate transmission in our community.   Virtual Visit via Video Note  I connected with Gary [name] on 05/01/19 at 10:00 AM EST by a video enabled telemedicine application and verified that I am speaking with the correct person using two identifiers.  Location patient: home Location provider:work or home office Persons participating in the virtual visit: patient, provider  I discussed the limitations of evaluation and management by telemedicine and the availability of in person appointments. The patient expressed understanding and agreed to proceed.   HPI: Gary [name] has history of atrial fibrillation, obstructive sleep apnea, hyperlipidemia, hypertension.  He and his wife were with couple of other couples on 23 December having dinner.  He found out the next day that one of the ladies that had dinner with started having symptoms that night and subsequently tested positive for Covid.  Both he and his wife on the 48 were tested for Covid and his test came back negative but wife's came back positive.  By the 27th he started feeling "bad".  He has some cough, nasal congestion, body aches, low-grade fever, and fatigue.  No diarrhea.  No loss of taste or smell.  Wife had similar symptoms.  Patient went back yesterday requesting repeat testing but was declined.  He is doing better overall and feels that his fever has resolved.  Cough is relatively mild.  No significant dyspnea.  Keeping down fluids well.  Fatigue is slightly improved.  He feels he is definitely turning a corner for the positive   ROS: See pertinent positives and negatives per HPI.  Past Medical History:  Diagnosis Date  . Adenomatous colon polyp 06/2000  . ALLERGIC RHINITIS 10/03/2007  . Allergy    seasonal  . Arthritis    knees  . Atrial flutter ([place]) 06/26/2014  .  Cancer ([place])    skin cancers removed in the past basal cell and squamous cell  . CARDIAC MURMUR, AORTIC 10/03/2007  . ERECTILE DYSFUNCTION 10/03/2007  . High blood pressure 03/2016  . HYPERLIPIDEMIA 10/03/2007  . Rosacea   . Sleep apnea     Past Surgical History:  Procedure Laterality Date  . ADENOIDECTOMY    . CARDIOVERSION N/A 07/01/2014   Procedure: CARDIOVERSION;  Surgeon: [name], MD;  Location: [facility];  Service: Cardiovascular;  Laterality: N/A;  . COLONOSCOPY    . PALATE SURGERY    . TEE WITHOUT CARDIOVERSION N/A 07/01/2014   Procedure: TRANSESOPHAGEAL ECHOCARDIOGRAM (TEE);  Surgeon: [name], MD;  Location: [facility] ENDOSCOPY;  Service: Cardiovascular;  Laterality: N/A;  . TONSILLECTOMY    . TOTAL KNEE ARTHROPLASTY Right 08/27/2017   Procedure: RIGHT TOTAL KNEE ARTHROPLASTY;  Surgeon: Vickey Huger, MD;  Location: [place];  Service: Orthopedics;  Laterality: Right;    Family History  Problem Relation Age of Onset  . Diabetes Mother   . Cancer Father        liver ca  . Liver cancer Father   . Hypotension Sister   . CAD Brother   . Stomach cancer Paternal Aunt     SOCIAL HX: Non-smoker   Current Outpatient Medications:  .  apixaban (ELIQUIS) 5 MG TABS tablet, Take 1 tablet (5 mg total) by mouth 2 (two) times daily., Disp: 180 tablet, Rfl: 0 .  co-enzyme Q-10 30 MG capsule, Take 30 mg by mouth every other day. , Disp: , Rfl:  .  fluticasone (FLONASE) 50 MCG/ACT nasal spray, Place 1 spray into both nostrils every other day., Disp: 32 g, Rfl: 5 .  lisinopril (ZESTRIL) 5 MG tablet, Take 1 tablet (5 mg total) by mouth daily., Disp: 90 tablet, Rfl: 3 .  minocycline (MINOCIN,DYNACIN) 100 MG capsule, minocycline 100 mg capsule, Disp: , Rfl:  .  Multiple Vitamin (MULTIVITAMIN) tablet, Take 1 tablet by mouth daily., Disp: , Rfl:  .  pantoprazole (PROTONIX) 40 MG tablet, Take 1 tablet (40 mg total) by mouth daily., Disp: 90 tablet, Rfl: 2 .  pantoprazole (PROTONIX) 40 MG  tablet, Take 1 tablet (40 mg total) by mouth daily., Disp: 30 tablet, Rfl: 0 .  simvastatin (ZOCOR) 20 MG tablet, Take 1 tablet (20 mg total) by mouth at bedtime., Disp: 90 tablet, Rfl: 4 .  tamsulosin (FLOMAX) 0.4 MG CAPS capsule, Take 1 capsule (0.4 mg total) by mouth daily., Disp: 90 capsule, Rfl: 1  Current Facility-Administered Medications:  .  0.9 %  sodium chloride infusion, 500 mL, Intravenous, Once, Ladene Artist, MD  EXAM:  VITALS per patient if applicable:  GENERAL: alert, oriented, appears well and in no acute distress  HEENT: atraumatic, conjunttiva clear, no obvious abnormalities on inspection of external nose and ears  NECK: normal movements of the head and neck  LUNGS: on inspection no signs of respiratory distress, breathing rate appears normal, no obvious gross SOB, gasping or wheezing  CV: no obvious cyanosis  MS: moves all visible extremities without noticeable abnormality  PSYCH/NEURO: pleasant and cooperative, no obvious depression or anxiety, speech and thought processing grossly intact  ASSESSMENT AND PLAN:  Discussed the following assessment and plan:  Exposure to COVID-19 virus  Cough  Fatigue, unspecified type   -Patient improving overall. -Continue plenty of fluids and rest -He is aware of minimum 10-day quarantine from onset of symptoms -We explained that he would still be a candidate for vaccine later on even though clinically it is very likely that he does have Covid at this time with classic symptoms and wife testing positive -Follow-up immediately for increased dyspnea or other concerns     I discussed the assessment and treatment plan with the patient. The patient was provided an opportunity to ask questions and all were answered. The patient agreed with the plan and demonstrated an understanding of the instructions.   The patient was advised to call back or seek an in-person evaluation if the symptoms worsen or if the condition fails  to improve as anticipated.     Carolann Littler, MD

## 2019-05-01 NOTE — Telephone Encounter (Signed)
Pt's wife called stating his oxygen level is low (90-96%); he also has cough, and fatigue; she is concerned that she tested +COVID, but the pt is negative; the pt is having fatigue, cough; they were tested at [facility]; she would like for the pt to be retested because they has the same symptoms, and he has afib explained there are community testing sites that do not require a MDs order, but an appt is required; also explained that oxygen levels are not constant, and can vary; also explained that if the pt had a COVID-19 test with a negative result (COVID not detected) we recommend no further testing or retesting for 14 days from the date of the original test. There may be circumstance that require a retest prior to the end of the 14 day window. If you feel you need a COVID-19 retest please contact your primary care provide; explained based on the pt's history/symptoms a visit is merited; she verbalized understanding; the pt sees Dr Ethlyn Gallery, [facility]; pt's wife transferred to [facility] for scheduling.  Reason for Disposition . General information question, no triage required and triager able to answer question  Answer Assessment - Initial Assessment Questions 1. REASON FOR CALL or QUESTION: "What is your reason for calling today?" or "How can I best help you?" or "What question do you have that I can help answer?"     Repeat COVID test  Protocols used: INFORMATION ONLY CALL - NO TRIAGE-A-AH

## 2019-05-01 NOTE — Telephone Encounter (Signed)
Message Routed to PCP CMA 

## 2019-05-10 DIAGNOSIS — U071 COVID-19: Secondary | ICD-10-CM | POA: Diagnosis not present

## 2019-05-13 ENCOUNTER — Ambulatory Visit: Payer: Medicare HMO | Admitting: Physician Assistant

## 2019-05-13 ENCOUNTER — Other Ambulatory Visit: Payer: Self-pay

## 2019-05-13 ENCOUNTER — Encounter: Payer: Self-pay | Admitting: Physician Assistant

## 2019-05-13 VITALS — BP 121/69 | HR 69 | Temp 97.5°F | Ht 75.0 in | Wt 244.4 lb

## 2019-05-13 DIAGNOSIS — I483 Typical atrial flutter: Secondary | ICD-10-CM

## 2019-05-13 DIAGNOSIS — I10 Essential (primary) hypertension: Secondary | ICD-10-CM

## 2019-05-13 DIAGNOSIS — E785 Hyperlipidemia, unspecified: Secondary | ICD-10-CM | POA: Diagnosis not present

## 2019-05-13 DIAGNOSIS — U071 COVID-19: Secondary | ICD-10-CM

## 2019-05-13 DIAGNOSIS — G4733 Obstructive sleep apnea (adult) (pediatric): Secondary | ICD-10-CM

## 2019-05-13 DIAGNOSIS — R0789 Other chest pain: Secondary | ICD-10-CM

## 2019-05-13 NOTE — Patient Instructions (Signed)
Medication Instructions:   STOP PANTOPRAZOLE (PROTONIX)  *If you need a refill on your cardiac medications before your next appointment, please call your pharmacy*  Lab Work:  NONE ordered at this time of appointment   If you have labs (blood work) drawn today and your tests are completely normal, you will receive your results only by: [name] MyChart Message (if you have MyChart) OR . A paper copy in the mail If you have any lab test that is abnormal or we need to change your treatment, we will call you to review the results.  Testing/Procedures:  NONE ordered at this time of appointment   Follow-Up: At [facility], you and your health needs are our priority.  As part of our continuing mission to provide you with exceptional heart care, we have created designated Provider Care Teams.  These Care Teams include your primary Cardiologist (physician) and Advanced Practice Providers (APPs -  Physician Assistants and Nurse Practitioners) who all work together to provide you with the care you need, when you need it.  Your next appointment:   6 month(s)  The format for your next appointment:   In Person  Provider:   [name], MD  Other Instructions

## 2019-05-13 NOTE — Progress Notes (Signed)
Cardiology Office Note:    Date:  05/15/2019   ID:  [name], DOB 10/10/1935, MRN [id number]  PCP:  Caren Macadam, MD  Cardiologist:  [name], MD  Electrophysiologist:  None   Referring MD: Caren Macadam, MD   Chief Complaint  Patient presents with  . Follow-up    seen for Dr. Sallyanne Kuster    History of Present Illness:    Gary [name] is a 84 y.o. male with a hx of atrial flutter, HTN, HLD, OSA and history of heart murmur.  Last TEE obtained on 07/01/2014 showed EF 55 to 60%, mild MR. He subsequently had a cardioversion on the same day.  Previous carotid Doppler obtained on 07/27/2016 showed mild bilateral carotid artery disease.  He was last seen by Dr. Sallyanne Kuster in November 2020 at which time his symptom was well controlled.   I last saw the patient on 12/17/1 2020 for evaluation of palpitation and chest pain.  Her symptom only lasted a few seconds before going away.  I suspect he is having PVCs on top of atrial flutter.  EKG showed rate controlled atrial flutter which is chronic.  He also complained of chest burning sensation when he is at rest.  The symptom of chest pain was very atypical.  I recommended a trial of Protonix as initial treatment, if symptoms still persist after 2 weeks, we can order a Myoview. He returns today for reevaluation.  Based on recent telehealth visit by Dr. Elease Hashimoto, his wife was recently tested positive for Covid after going out for dinner with a friend.  He was also present at the time and even though he was initially tested negative, he began to feel bad soon afterward.  He was instructed to self quarantine for minimal 10 days.  He says chest discomfort resolved on Protonix.  He still occasionally feels skipped heartbeat however this only occurred fairly rarely about once a week.  He denies any dizzy spell during the episode.  At this time I do not recommend any further work-up.   Past Medical History:  Diagnosis Date  . Adenomatous  colon polyp 06/2000  . ALLERGIC RHINITIS 10/03/2007  . Allergy    seasonal  . Arthritis    knees  . Atrial flutter ([place]) 06/26/2014  . Cancer ([place])    skin cancers removed in the past basal cell and squamous cell  . CARDIAC MURMUR, AORTIC 10/03/2007  . ERECTILE DYSFUNCTION 10/03/2007  . High blood pressure 03/2016  . HYPERLIPIDEMIA 10/03/2007  . Rosacea   . Sleep apnea     Past Surgical History:  Procedure Laterality Date  . ADENOIDECTOMY    . CARDIOVERSION N/A 07/01/2014   Procedure: CARDIOVERSION;  Surgeon: [name], MD;  Location: [facility];  Service: Cardiovascular;  Laterality: N/A;  . COLONOSCOPY    . PALATE SURGERY    . TEE WITHOUT CARDIOVERSION N/A 07/01/2014   Procedure: TRANSESOPHAGEAL ECHOCARDIOGRAM (TEE);  Surgeon: [name], MD;  Location: [facility] ENDOSCOPY;  Service: Cardiovascular;  Laterality: N/A;  . TONSILLECTOMY    . TOTAL KNEE ARTHROPLASTY Right 08/27/2017   Procedure: RIGHT TOTAL KNEE ARTHROPLASTY;  Surgeon: Vickey Huger, MD;  Location: [place];  Service: Orthopedics;  Laterality: Right;    Current Medications: Current Meds  Medication Sig  . co-enzyme Q-10 30 MG capsule Take 30 mg by mouth every other day.   [name] ELIQUIS 5 MG TABS tablet TAKE 1 TABLET TWICE DAILY  . fluticasone (FLONASE) 50 MCG/ACT nasal spray Place 1  spray into both nostrils every other day.  . lisinopril (ZESTRIL) 5 MG tablet Take 1 tablet (5 mg total) by mouth daily.  . minocycline (MINOCIN,DYNACIN) 100 MG capsule minocycline 100 mg capsule  . Multiple Vitamin (MULTIVITAMIN) tablet Take 1 tablet by mouth daily.  . pantoprazole (PROTONIX) 40 MG tablet Take 1 tablet (40 mg total) by mouth daily.  . simvastatin (ZOCOR) 20 MG tablet Take 1 tablet (20 mg total) by mouth at bedtime.  . tamsulosin (FLOMAX) 0.4 MG CAPS capsule Take 1 capsule (0.4 mg total) by mouth daily.  . [DISCONTINUED] pantoprazole (PROTONIX) 40 MG tablet Take 1 tablet (40 mg total) by mouth daily.   Current  Facility-Administered Medications for the 05/13/19 encounter (Office Visit) with [name], PA  Medication  . 0.9 %  sodium chloride infusion     Allergies:   Patient has no known allergies.   Social History   Socioeconomic History  . Marital status: Married    Spouse name: Not on file  . Number of children: 2  . Years of education: Not on file  . Highest education level: Not on file  Occupational History  . Occupation: IT trainer company    Comment: retired  Tobacco Use  . Smoking status: Former Research scientist (life sciences)  . Smokeless tobacco: Never Used  Substance and Sexual Activity  . Alcohol use: Yes    Alcohol/week: 8.0 standard drinks    Types: 8 Glasses of wine per week    Comment: ocassionally   . Drug use: No  . Sexual activity: Not on file  Other Topics Concern  . Not on file  Social History Narrative   Lives with wife in two story house, but occupies main level mostly   Has 4 children, two biological, two stepchildren, 9 grandkids   One son local, supportive    Active playing golf with friends, goes to Physiological scientist 2 days/week at [employer]   Enjoys reading, soduku   Social Determinants of Health   Financial Resource Strain: Low Risk   . Difficulty of Paying Living Expenses: Not hard at all  Food Insecurity: No Food Insecurity  . Worried About Charity fundraiser in the Last Year: Never true  . Ran Out of Food in the Last Year: Never true  Transportation Needs: No Transportation Needs  . Lack of Transportation (Medical): No  . Lack of Transportation (Non-Medical): No  Physical Activity: Insufficiently Active  . Days of Exercise per Week: 2 days  . Minutes of Exercise per Session: 30 min  Stress: No Stress Concern Present  . Feeling of Stress : Not at all  Social Connections: Slightly Isolated  . Frequency of Communication with Friends and Family: Twice a week  . Frequency of Social Gatherings with Friends and Family: Twice a week  . Attends Religious  Services: Never  . Active Member of Clubs or Organizations: Yes  . Attends Archivist Meetings: More than 4 times per year  . Marital Status: Married     Family History: The patient's family history includes CAD in his brother; Cancer in his father; Diabetes in his mother; Hypotension in his sister; Liver cancer in his father; Stomach cancer in his paternal aunt.  ROS:   Please see the history of present illness.     All other systems reviewed and are negative.  EKGs/Labs/Other Studies Reviewed:    The following studies were reviewed today:  Echo 07/01/2014 LV EF: 55% -  60%  Study Conclusions   -  Left ventricle: Systolic function was normal. The estimated  ejection fraction was in the range of 55% to 60%. Wall motion was  normal; there were no regional wall motion abnormalities.  - Aortic valve: There was trivial regurgitation.  - Mitral valve: There was mild regurgitation.  - Left atrium: No evidence of thrombus in the atrial cavity or  appendage. The appendage was morphologically a left appendage,  multilobulated, and of normal size. Emptying velocity was mildly  reduced.  - Right atrium: No evidence of thrombus in the atrial cavity or  appendage.   EKG:  EKG is ordered today.  The ekg ordered today demonstrates atrial flutter, poor R wave progression in anterior leads, no significant ST-T wave changes.  Questionable Q waves in the inferior lead.  Recent Labs: 01/10/2019: ALT 23; BUN 34; Creatinine, Ser 1.16; Hemoglobin 15.1; Platelets 138.0; Potassium 4.4; Sodium 139  Recent Lipid Panel    Component Value Date/Time   CHOL 166 01/10/2019 0826   TRIG 128.0 01/10/2019 0826   HDL 37.60 (L) 01/10/2019 0826   CHOLHDL 4 01/10/2019 0826   VLDL 25.6 01/10/2019 0826   LDLCALC 103 (H) 01/10/2019 0826   LDLDIRECT 143.5 01/22/2012 0916    Physical Exam:    VS:  BP 121/69   Pulse 69   Temp (!) 97.5 F (36.4 C)   Ht 6\' 3"  (1.905 m)   Wt 244 lb 6.4 oz  (110.9 kg)   SpO2 99%   BMI 30.55 kg/m     Wt Readings from Last 3 Encounters:  05/13/19 244 lb 6.4 oz (110.9 kg)  04/17/19 249 lb 9.6 oz (113.2 kg)  03/18/19 246 lb (111.6 kg)     GEN: Well nourished, well developed in no acute distress HEENT: Normal NECK: No JVD; No carotid bruits LYMPHATICS: No lymphadenopathy CARDIAC: irregular, no murmurs, rubs, gallops RESPIRATORY:  Clear to auscultation without rales, wheezing or rhonchi  ABDOMEN: Soft, non-tender, non-distended MUSCULOSKELETAL:  No edema; No deformity  SKIN: Warm and dry NEUROLOGIC:  Alert and oriented x 3 PSYCHIATRIC:  Normal affect   ASSESSMENT:    1. Atypical chest pain   2. Typical atrial flutter ([place])   3. Essential hypertension   4. Hyperlipidemia LDL goal <100   5. OSA (obstructive sleep apnea)   6. COVID-19    PLAN:    In order of problems listed above:  1. Atypical chest pain: Symptom has completely resolved.  EKG is unchanged.  He wished to come off pantoprazole at this point, we can hold Protonix as a trial  2. Atrial flutter: He remains in atrial flutter during the last few visit.  At this point, it is essentially permanent.  Continue Eliquis  3. Hypertension: Blood pressure stable  4. Hyperlipidemia: On Zocor  5. Obstructive sleep apnea: On CPAP therapy  6. COVID-19 contact: His wife was recently diagnosed with COVID-19, even though he was initially tested negative, however he developed Covid-like symptom after a few days.  He has finished a 10-day quarantine.   Medication Adjustments/Labs and Tests Ordered: Current medicines are reviewed at length with the patient today.  Concerns regarding medicines are outlined above.  Orders Placed This Encounter  Procedures  . EKG 12-Lead   No orders of the defined types were placed in this encounter.   Patient Instructions  Medication Instructions:   STOP PANTOPRAZOLE (PROTONIX)  *If you need a refill on your cardiac medications before your  next appointment, please call your pharmacy*  Lab Work:  NONE ordered  at this time of appointment   If you have labs (blood work) drawn today and your tests are completely normal, you will receive your results only by: [name] MyChart Message (if you have MyChart) OR . A paper copy in the mail If you have any lab test that is abnormal or we need to change your treatment, we will call you to review the results.  Testing/Procedures:  NONE ordered at this time of appointment   Follow-Up: At [facility], you and your health needs are our priority.  As part of our continuing mission to provide you with exceptional heart care, we have created designated Provider Care Teams.  These Care Teams include your primary Cardiologist (physician) and Advanced Practice Providers (APPs -  Physician Assistants and Nurse Practitioners) who all work together to provide you with the care you need, when you need it.  Your next appointment:   6 month(s)  The format for your next appointment:   In Person  Provider:   [name], MD  Other Instructions      Signed, [name], [place]  05/15/2019 11:09 PM    [place]

## 2019-05-15 ENCOUNTER — Encounter: Payer: Self-pay | Admitting: Physician Assistant

## 2019-05-16 ENCOUNTER — Telehealth: Payer: Self-pay | Admitting: *Deleted

## 2019-05-16 ENCOUNTER — Encounter: Payer: Self-pay | Admitting: Family Medicine

## 2019-05-16 NOTE — Telephone Encounter (Signed)
Answered through mychart

## 2019-05-16 NOTE — Telephone Encounter (Signed)
Patient wants to know if he should wait 45 or 90 days before he should get the vaccine after testing positive. Please advise.  Copied from [place] [id number]. Topic: General - Inquiry >> May 16, 2019  9:55 AM Gary [name] D wrote: Reason for CRM: Pt's wife called saying her husband has recently tested positive for Covid and he has an appt next week for the Vaccine.  She is wanting to know if it is ok for him to take the vaccine.  He tested positive twice already most recently last Saturday  CB#  [phone]

## 2019-05-20 DIAGNOSIS — G4733 Obstructive sleep apnea (adult) (pediatric): Secondary | ICD-10-CM | POA: Diagnosis not present

## 2019-05-26 DIAGNOSIS — C44311 Basal cell carcinoma of skin of nose: Secondary | ICD-10-CM | POA: Diagnosis not present

## 2019-05-26 DIAGNOSIS — Z85828 Personal history of other malignant neoplasm of skin: Secondary | ICD-10-CM | POA: Diagnosis not present

## 2019-06-12 DIAGNOSIS — H34832 Tributary (branch) retinal vein occlusion, left eye, with macular edema: Secondary | ICD-10-CM | POA: Diagnosis not present

## 2019-06-12 DIAGNOSIS — H35033 Hypertensive retinopathy, bilateral: Secondary | ICD-10-CM | POA: Diagnosis not present

## 2019-06-12 DIAGNOSIS — H43813 Vitreous degeneration, bilateral: Secondary | ICD-10-CM | POA: Diagnosis not present

## 2019-06-12 DIAGNOSIS — H3582 Retinal ischemia: Secondary | ICD-10-CM | POA: Diagnosis not present

## 2019-07-03 ENCOUNTER — Other Ambulatory Visit: Payer: Self-pay | Admitting: Cardiovascular Disease

## 2019-07-11 ENCOUNTER — Other Ambulatory Visit: Payer: Self-pay

## 2019-07-13 NOTE — Progress Notes (Signed)
Gary [name] DOB: 1935/07/26 Encounter date: 07/14/2019  This is a 84 y.o. male who presents for complete physical   History of present illness/Additional concerns:  A flutter: follows with Dr. Sallyanne Kuster. On eliquis. No issues with heart racing  [id number] ok at home.   Allergic rhinitis:uses flonase which helps with control.   Rosacea:controlled; follows with derm q 6 months. Usually gets some things cut off every time he goes. Good about regular follow up.   OSA: uses cpap every night.   Was working out with a trainer  No repeat colonoscopy needed due to age. He is up-to-date with pneumonia vaccinations. He is due for Tdap, shingles vaccinations  Past Medical History:  Diagnosis Date  . Adenomatous colon polyp 06/2000  . ALLERGIC RHINITIS 10/03/2007  . Allergy    seasonal  . Arthritis    knees  . Atrial flutter ([place]) 06/26/2014  . Cancer ([place])    skin cancers removed in the past basal cell and squamous cell  . CARDIAC MURMUR, AORTIC 10/03/2007  . ERECTILE DYSFUNCTION 10/03/2007  . High blood pressure 03/2016  . HYPERLIPIDEMIA 10/03/2007  . Rosacea   . Sleep apnea    Past Surgical History:  Procedure Laterality Date  . ADENOIDECTOMY    . CARDIOVERSION N/A 07/01/2014   Procedure: CARDIOVERSION;  Surgeon: [name], MD;  Location: [facility] ENDOSCOPY;  Service: Cardiovascular;  Laterality: N/A;  . COLONOSCOPY    . PALATE SURGERY    . TEE WITHOUT CARDIOVERSION N/A 07/01/2014   Procedure: TRANSESOPHAGEAL ECHOCARDIOGRAM (TEE);  Surgeon: [name], MD;  Location: [facility] ENDOSCOPY;  Service: Cardiovascular;  Laterality: N/A;  . TONSILLECTOMY    . TOTAL KNEE ARTHROPLASTY Right 08/27/2017   Procedure: RIGHT TOTAL KNEE ARTHROPLASTY;  Surgeon: Vickey Huger, MD;  Location: [place];  Service: Orthopedics;  Laterality: Right;   No Known Allergies Current Meds  Medication Sig  . co-enzyme Q-10 30 MG capsule Take 30 mg by mouth every other day.   [name] ELIQUIS 5 MG TABS tablet TAKE 1  TABLET TWICE DAILY  . fluticasone (FLONASE) 50 MCG/ACT nasal spray Place 1 spray into both nostrils every other day.  . lisinopril (ZESTRIL) 5 MG tablet Take 1 tablet (5 mg total) by mouth daily.  . minocycline (MINOCIN,DYNACIN) 100 MG capsule minocycline 100 mg capsule  . Multiple Vitamin (MULTIVITAMIN) tablet Take 1 tablet by mouth daily.  . pantoprazole (PROTONIX) 40 MG tablet Take 1 tablet (40 mg total) by mouth daily.  . simvastatin (ZOCOR) 20 MG tablet Take 1 tablet (20 mg total) by mouth at bedtime.  . tamsulosin (FLOMAX) 0.4 MG CAPS capsule Take 1 capsule (0.4 mg total) by mouth daily.  . [DISCONTINUED] lisinopril (ZESTRIL) 5 MG tablet Take 1 tablet (5 mg total) by mouth daily.   Current Facility-Administered Medications for the 07/14/19 encounter (Office Visit) with Caren Macadam, MD  Medication  . 0.9 %  sodium chloride infusion   Social History   Tobacco Use  . Smoking status: Former Research scientist (life sciences)  . Smokeless tobacco: Never Used  Substance Use Topics  . Alcohol use: Yes    Alcohol/week: 8.0 standard drinks    Types: 8 Glasses of wine per week    Comment: ocassionally    Family History  Problem Relation Age of Onset  . Diabetes Mother   . Cancer Father        liver ca  . Liver cancer Father   . Hypotension Sister   . CAD Brother   . Stomach cancer  Paternal Aunt      Review of Systems  Constitutional: Negative for chills, fatigue and fever.  Respiratory: Negative for cough, chest tightness, shortness of breath and wheezing.   Cardiovascular: Negative for chest pain, palpitations and leg swelling.  Genitourinary: Negative for difficulty urinating and frequency.    CBC:  Lab Results  Component Value Date   WBC 5.1 07/14/2019   HGB 14.9 07/14/2019   HCT 44.2 07/14/2019   MCH 32.7 08/16/2017   MCHC 33.8 07/14/2019   RDW 15.0 07/14/2019   PLT 149.0 (L) 07/14/2019   MPV 11.1 06/29/2014   CMP: Lab Results  Component Value Date   NA 141 07/14/2019   K 4.9  07/14/2019   CL 108 07/14/2019   CO2 27 07/14/2019   ANIONGAP 8 08/16/2017   GLUCOSE 117 (H) 07/14/2019   BUN 33 (H) 07/14/2019   CREATININE 1.19 07/14/2019   CREATININE 0.90 06/29/2014   GFRAA >60 08/16/2017   CALCIUM 8.9 07/14/2019   PROT 6.4 07/14/2019   BILITOT 1.0 07/14/2019   ALKPHOS 58 07/14/2019   ALT 16 07/14/2019   AST 16 07/14/2019   LIPID: Lab Results  Component Value Date   CHOL 163 07/14/2019   TRIG 96.0 07/14/2019   HDL 41.70 07/14/2019   LDLCALC 102 (H) 07/14/2019    Objective:  BP 120/70 (BP Location: Left Arm, Patient Position: Sitting, Cuff Size: Large)   Pulse (!) 53   Temp 97.6 F (36.4 C) (Temporal)   Ht 6\' 3"  (1.905 m)   Wt 240 lb 6.4 oz (109 kg)   SpO2 95%   BMI 30.05 kg/m   Weight: 240 lb 6.4 oz (109 kg)   BP Readings from Last 3 Encounters:  07/14/19 120/70  05/13/19 121/69  05/01/19 128/75   Wt Readings from Last 3 Encounters:  07/14/19 240 lb 6.4 oz (109 kg)  05/13/19 244 lb 6.4 oz (110.9 kg)  04/17/19 249 lb 9.6 oz (113.2 kg)    Physical Exam Constitutional:      General: He is not in acute distress.    Appearance: He is well-developed.  HENT:     Head: Normocephalic and atraumatic.     Right Ear: External ear normal.     Left Ear: External ear normal.     Nose: Nose normal.     Mouth/Throat:     Pharynx: No oropharyngeal exudate.  Eyes:     Conjunctiva/sclera: Conjunctivae normal.     Pupils: Pupils are equal, round, and reactive to light.  Neck:     Thyroid: No thyromegaly.  Cardiovascular:     Rate and Rhythm: Normal rate and regular rhythm.     Heart sounds: Normal heart sounds. No murmur. No friction rub. No gallop.   Pulmonary:     Effort: Pulmonary effort is normal. No respiratory distress.     Breath sounds: Normal breath sounds. No stridor. No wheezing or rales.  Abdominal:     General: Bowel sounds are normal.     Palpations: Abdomen is soft.  Musculoskeletal:        General: Normal range of motion.      Cervical back: Neck supple.     Comments: On review of systems, patient states that he has had a slight foot discomfort on the right midfoot.  No injury, no new shoes.  No change in activity level.  Pain is not there all the time, but he notes sometimes with walking or with palpation.  There is very faint erythema  to the right foot.  There is a very mild discomfort with squeezing on the foot and a slight tenderness with palpation over the fourth and fifth metatarsals.  Skin:    General: Skin is warm and dry.  Neurological:     Mental Status: He is alert and oriented to person, place, and time.  Psychiatric:        Behavior: Behavior normal.        Thought Content: Thought content normal.        Judgment: Judgment normal.     Assessment/Plan: Health Maintenance Due  Topic Date Due  . FOOT EXAM  Never done   Health Maintenance reviewed - up to date with preventative care.  1. Typical atrial flutter (HCC) Rate controlled.  Sounds in sinus today.  He does follow regularly with cardiology.  He is on Eliquis for anticoagulation.  2. Essential hypertension Blood pressures well controlled.  Continue current medications. - CBC with Differential/Platelet  3. Obstructive sleep apnea Wears CPAP nightly.  4. Hypercholesterolemia Continue with Zocor 20 mg. - Lipid panel - Comprehensive metabolic panel  5. Impaired fasting glucose Diet controlled. - Hemoglobin A1c  6. Right foot pain Pain is very mild currently.  I am going to add uric acid and sed rate to his lab work today.  I have asked him to closely monitor and let me know if any increased redness or pain.  Further evaluation pending any change in condition and blood work results.  He has not ever had gout flare in the past. - Uric acid; Future - Sedimentation rate; Future - Sedimentation rate - Uric acid  7. Enlarged prostate Currently taking Flomax.  No difficulty with emptying bladder. - PSA  Return in about 6 months  (around 01/14/2020) for Chronic condition visit.  [name], MD

## 2019-07-14 ENCOUNTER — Encounter: Payer: Self-pay | Admitting: Family Medicine

## 2019-07-14 ENCOUNTER — Other Ambulatory Visit: Payer: Self-pay

## 2019-07-14 ENCOUNTER — Ambulatory Visit (INDEPENDENT_AMBULATORY_CARE_PROVIDER_SITE_OTHER): Payer: Medicare HMO | Admitting: Family Medicine

## 2019-07-14 VITALS — BP 120/70 | HR 53 | Temp 97.6°F | Ht 75.0 in | Wt 240.4 lb

## 2019-07-14 DIAGNOSIS — Z0000 Encounter for general adult medical examination without abnormal findings: Secondary | ICD-10-CM

## 2019-07-14 DIAGNOSIS — I483 Typical atrial flutter: Secondary | ICD-10-CM | POA: Diagnosis not present

## 2019-07-14 DIAGNOSIS — I10 Essential (primary) hypertension: Secondary | ICD-10-CM

## 2019-07-14 DIAGNOSIS — E7800 Pure hypercholesterolemia, unspecified: Secondary | ICD-10-CM

## 2019-07-14 DIAGNOSIS — G4733 Obstructive sleep apnea (adult) (pediatric): Secondary | ICD-10-CM

## 2019-07-14 DIAGNOSIS — M79671 Pain in right foot: Secondary | ICD-10-CM | POA: Diagnosis not present

## 2019-07-14 DIAGNOSIS — R7301 Impaired fasting glucose: Secondary | ICD-10-CM

## 2019-07-14 DIAGNOSIS — N400 Enlarged prostate without lower urinary tract symptoms: Secondary | ICD-10-CM | POA: Diagnosis not present

## 2019-07-14 LAB — COMPREHENSIVE METABOLIC PANEL
ALT: 16 U/L (ref 0–53)
AST: 16 U/L (ref 0–37)
Albumin: 3.8 g/dL (ref 3.5–5.2)
Alkaline Phosphatase: 58 U/L (ref 39–117)
BUN: 33 mg/dL — ABNORMAL HIGH (ref 6–23)
CO2: 27 mEq/L (ref 19–32)
Calcium: 8.9 mg/dL (ref 8.4–10.5)
Chloride: 108 mEq/L (ref 96–112)
Creatinine, Ser: 1.19 mg/dL (ref 0.40–1.50)
GFR: 58.62 mL/min — ABNORMAL LOW (ref >60.00)
Glucose, Bld: 117 mg/dL — ABNORMAL HIGH (ref 70–99)
Potassium: 4.9 mEq/L (ref 3.5–5.1)
Sodium: 141 mEq/L (ref 135–145)
Total Bilirubin: 1 mg/dL (ref 0.2–1.2)
Total Protein: 6.4 g/dL (ref 6.0–8.3)

## 2019-07-14 LAB — URIC ACID: Uric Acid, Serum: 8.7 mg/dL — ABNORMAL HIGH (ref 4.0–7.8)

## 2019-07-14 LAB — LIPID PANEL
Cholesterol: 163 mg/dL (ref 0–200)
HDL: 41.7 mg/dL (ref >39.00)
LDL Cholesterol: 102 mg/dL — ABNORMAL HIGH (ref 0–99)
NonHDL: 121.13
Total CHOL/HDL Ratio: 4
Triglycerides: 96 mg/dL (ref 0.0–149.0)
VLDL: 19.2 mg/dL (ref 0.0–40.0)

## 2019-07-14 LAB — CBC WITH DIFFERENTIAL/PLATELET
Basophils Absolute: 0 10*3/uL (ref 0.0–0.1)
Basophils Relative: 0.5 % (ref 0.0–3.0)
Eosinophils Absolute: 0.1 10*3/uL (ref 0.0–0.7)
Eosinophils Relative: 2.8 % (ref 0.0–5.0)
HCT: 44.2 % (ref 39.0–52.0)
Hemoglobin: 14.9 g/dL (ref 13.0–17.0)
Lymphocytes Relative: 18.7 % (ref 12.0–46.0)
Lymphs Abs: 0.9 10*3/uL (ref 0.7–4.0)
MCHC: 33.8 g/dL (ref 30.0–36.0)
MCV: 96.2 fl (ref 78.0–100.0)
Monocytes Absolute: 0.7 10*3/uL (ref 0.1–1.0)
Monocytes Relative: 13.3 % — ABNORMAL HIGH (ref 3.0–12.0)
Neutro Abs: 3.3 10*3/uL (ref 1.4–7.7)
Neutrophils Relative %: 64.7 % (ref 43.0–77.0)
Platelets: 149 10*3/uL — ABNORMAL LOW (ref 150.0–400.0)
RBC: 4.59 Mil/uL (ref 4.22–5.81)
RDW: 15 % (ref 11.5–15.5)
WBC: 5.1 10*3/uL (ref 4.0–10.5)

## 2019-07-14 LAB — PSA: PSA: 1.32 ng/mL (ref 0.10–4.00)

## 2019-07-14 LAB — SEDIMENTATION RATE: Sed Rate: 6 mm/hr (ref 0–20)

## 2019-07-14 LAB — HEMOGLOBIN A1C: Hgb A1c MFr Bld: 5.8 % (ref 4.6–6.5)

## 2019-07-14 MED ORDER — LISINOPRIL 5 MG PO TABS: 5.0000 mg | ORAL_TABLET | Freq: Every day | ORAL | 3 refills | Status: DC

## 2019-07-14 MED ORDER — SHINGRIX 50 MCG/0.5ML IM SUSR: 0.5000 mL | Freq: Once | INTRAMUSCULAR | 0 refills | Status: AC

## 2019-07-14 NOTE — Patient Instructions (Addendum)
Ask about Tdap at pharmacy. You could get this at the same time as shingles.   MAKE SURE LAB DRAWS ORDERS FROM TODAY AS WELL AS THOSE FROM 12/2018

## 2019-07-16 ENCOUNTER — Other Ambulatory Visit: Payer: Self-pay | Admitting: Family Medicine

## 2019-07-16 DIAGNOSIS — E790 Hyperuricemia without signs of inflammatory arthritis and tophaceous disease: Secondary | ICD-10-CM

## 2019-08-14 ENCOUNTER — Other Ambulatory Visit: Payer: Self-pay

## 2019-08-15 ENCOUNTER — Other Ambulatory Visit (INDEPENDENT_AMBULATORY_CARE_PROVIDER_SITE_OTHER): Payer: Medicare HMO

## 2019-08-15 DIAGNOSIS — E790 Hyperuricemia without signs of inflammatory arthritis and tophaceous disease: Secondary | ICD-10-CM | POA: Diagnosis not present

## 2019-08-15 LAB — URIC ACID: Uric Acid, Serum: 9.2 mg/dL — ABNORMAL HIGH (ref 4.0–7.8)

## 2019-08-21 ENCOUNTER — Other Ambulatory Visit: Payer: Self-pay | Admitting: Family Medicine

## 2019-08-21 DIAGNOSIS — N400 Enlarged prostate without lower urinary tract symptoms: Secondary | ICD-10-CM

## 2019-09-11 DIAGNOSIS — H35033 Hypertensive retinopathy, bilateral: Secondary | ICD-10-CM | POA: Diagnosis not present

## 2019-09-11 DIAGNOSIS — H34832 Tributary (branch) retinal vein occlusion, left eye, with macular edema: Secondary | ICD-10-CM | POA: Diagnosis not present

## 2019-09-11 DIAGNOSIS — H3582 Retinal ischemia: Secondary | ICD-10-CM | POA: Diagnosis not present

## 2019-09-11 DIAGNOSIS — H43813 Vitreous degeneration, bilateral: Secondary | ICD-10-CM | POA: Diagnosis not present

## 2019-09-17 ENCOUNTER — Ambulatory Visit: Payer: Medicare HMO | Admitting: Cardiovascular Disease

## 2019-09-17 ENCOUNTER — Other Ambulatory Visit: Payer: Self-pay

## 2019-09-17 ENCOUNTER — Encounter: Payer: Self-pay | Admitting: Cardiovascular Disease

## 2019-09-17 VITALS — BP 140/68 | HR 62 | Ht 75.0 in | Wt 244.0 lb

## 2019-09-17 DIAGNOSIS — U071 COVID-19: Secondary | ICD-10-CM

## 2019-09-17 DIAGNOSIS — G4733 Obstructive sleep apnea (adult) (pediatric): Secondary | ICD-10-CM

## 2019-09-17 DIAGNOSIS — E785 Hyperlipidemia, unspecified: Secondary | ICD-10-CM

## 2019-09-17 DIAGNOSIS — Z7901 Long term (current) use of anticoagulants: Secondary | ICD-10-CM

## 2019-09-17 DIAGNOSIS — I483 Typical atrial flutter: Secondary | ICD-10-CM

## 2019-09-17 MED ORDER — LISINOPRIL 5 MG PO TABS: 7.5000 mg | ORAL_TABLET | Freq: Every day | ORAL | 3 refills | Status: DC

## 2019-09-17 NOTE — Patient Instructions (Signed)
Medication Instructions:  INCREASE YOUR LISINOPRIL TO 7.5MG  DAILY (1.5 TABLETS) *If you need a refill on your cardiac medications before your next appointment, please call your pharmacy*  Follow-Up: At [facility], you and your health needs are our priority.  As part of our continuing mission to provide you with exceptional heart care, we have created designated Provider Care Teams.  These Care Teams include your primary Cardiologist (physician) and Advanced Practice Providers (APPs -  Physician Assistants and Nurse Practitioners) who all work together to provide you with the care you need, when you need it.  We recommend signing up for the patient portal called "MyChart".  Sign up information is provided on this After Visit Summary.  MyChart is used to connect with patients for Virtual Visits (Telemedicine).  Patients are able to view lab/test results, encounter notes, upcoming appointments, etc.  Non-urgent messages can be sent to your provider as well.   To learn more about what you can do with MyChart, go to [email or web address].    Your next appointment:   12 month(s)  The format for your next appointment:   In Person  Provider:   Shelva Majestic, MD

## 2019-09-17 NOTE — Progress Notes (Signed)
Patient ID: TORRANCE STOCKLEY, male   DOB: 05-01-1936, 84 y.o.   MRN: [id number]      HPI: PACO CISLO, is a 84 y.o. male who presents for a 19 month f/u evaluation.  [name] is a 84 year old male who is a patient of Dr. Sallyanne Kuster.  He is status post cardioversion for atrial flutter, and has a history of hyperlipidemia and aortic valve sclerosis.  He was referred for a sleep study which was done in May 2016 which revealed moderate obstructive sleep apnea with an AHI of 15.9 overall and 20.5 per hour during REM sleep.  He had reduced sleep efficiency at 54.8%.  Latency to sleep onset was prolonged at 36 minutes.  There was mild snoring.  Oxygen desaturated to 89%.  He underwent a CPAP titration trial and adnexal response to CPAP therapy with the initial recommended CPAP pressure 9 cm water pressure.  Due to oral venting, a nasal pillow mask with chinstrap was recommended.  Since initiating CPAP therapy, he has felt well.  He typically goes to bed at 10:30 and wakes up the proximal he 7:30 AM.  A download was obtained from 11/22/2014 through 12/21/2014.  Compliance was excellent with 100% days used and 100% of days used greater than 4 hours.  He is averaging 7 hours and 30 minutes of sleep per night.  On his 57 m water pressure, AHI is excellent at 0.8.  There is no significant mask leak and he has a ResMed AirFit P10 medium size mask.  A download was obtained from 11/28/2015 through 12/27/2015.  This confirms 100% compliance both with usage stays and greater than 4 hours of use.  He is averaging 7 hours and 22 minutes of use per night.  He is set at a 9 cm water pressure.  AHI remains excellent at 1.2 per hour.  He does have increased leak with 95th percent average at 30.0.  Despite this mild leak newborn nasal pillows.  His AHI remains excellent and undoubtedly is probably slightly less than its current reading.  He denied residual snoring.  Sleep is restorative.  He denied daytime sleepiness or  bruxism.[name]   He was found to have increased periodic limb movements on his initial sleep study and had increased PLMS index of 19.7, but denies painful restless legs or the "urge to move."  Epworth Sleepiness Scale: Situation   Chance of Dozing/Sleeping (0 = never , 1 = slight chance , 2 = moderate chance , 3 = high chance )   sitting and reading 1   watching TV 1   sitting inactive in a public place 0   being a passenger in a motor vehicle for an hour or more 1   lying down in the afternoon 2   sitting and talking to someone 0   sitting quietly after lunch (no alcohol) 0   while stopped for a few minutes in traffic as the driver 0   Total Score  5   I saw him in October 2018 at which time he continued to have 100% compliance was CPAP therapy.  He was averaging 6 hours and 26 minutes per night of use.  At a set pressure of 9 cm, AHI was excellent at 0.7.  He has a Special educational needs teacher P37mdium-size with chinstrap mask.  There was minimal leak intermittently.  An Epworth Sleepiness Scale score was recalculated and this endorsed at 4.   He was last evaluated by me in November 2019 at which time  he continued to do well with reference to his sleep apnea. A new download was obtained in the office today from October 11 through March 09, 2018.  He continued to be compliant and is averaging 7 hours and 36 minutes of CPAP use per night.  AHI remains excellent at 0.7.  At times he has had some irritation from his mask.  His sleep is restorative.  He is unaware of breakthrough snoring.  He continues to have atrial flutter which is chronic and is followed by Dr. Sallyanne Kuster.    He developed COVID-19 infection in December and was last seen in our office in January 2021 by Loma Sousa, PA.  He was having some palpitations.  He continues to be on CPAP therapy and admits to excellent compliance.  A download was obtained from August 18, 2019 through Sep 16, 2019 which confirms 100% compliance with average usage 7 hours and  14 minutes.  At 9 cm set pressure, AHI is excellent at 0.5.  He has a DreamWear nasal mask.  He is sleeping well.  He is unaware of breakthrough snoring.  He typically goes to bed between 11 and 1130 and wakes up at 7:30 AM.  Blood pressure is recently been slightly increased.  He presents for evaluation.  Past Medical History:  Diagnosis Date  . Adenomatous colon polyp 06/2000  . ALLERGIC RHINITIS 10/03/2007  . Allergy    seasonal  . Arthritis    knees  . Atrial flutter ([place]) 06/26/2014  . Cancer ([place])    skin cancers removed in the past basal cell and squamous cell  . CARDIAC MURMUR, AORTIC 10/03/2007  . ERECTILE DYSFUNCTION 10/03/2007  . High blood pressure 03/2016  . HYPERLIPIDEMIA 10/03/2007  . Rosacea   . Sleep apnea     Past Surgical History:  Procedure Laterality Date  . ADENOIDECTOMY    . CARDIOVERSION N/A 07/01/2014   Procedure: CARDIOVERSION;  Surgeon: [name], MD;  Location: [facility] ENDOSCOPY;  Service: Cardiovascular;  Laterality: N/A;  . COLONOSCOPY    . PALATE SURGERY    . TEE WITHOUT CARDIOVERSION N/A 07/01/2014   Procedure: TRANSESOPHAGEAL ECHOCARDIOGRAM (TEE);  Surgeon: [name], MD;  Location: [facility] ENDOSCOPY;  Service: Cardiovascular;  Laterality: N/A;  . TONSILLECTOMY    . TOTAL KNEE ARTHROPLASTY Right 08/27/2017   Procedure: RIGHT TOTAL KNEE ARTHROPLASTY;  Surgeon: Vickey Huger, MD;  Location: [place];  Service: Orthopedics;  Laterality: Right;    No Known Allergies  Current Outpatient Medications  Medication Sig Dispense Refill  . co-enzyme Q-10 30 MG capsule Take 30 mg by mouth every other day.     [name] ELIQUIS 5 MG TABS tablet TAKE 1 TABLET TWICE DAILY 180 tablet 0  . fluticasone (FLONASE) 50 MCG/ACT nasal spray Place 1 spray into both nostrils every other day. 32 g 5  . lisinopril (ZESTRIL) 5 MG tablet Take 1.5 tablets (7.5 mg total) by mouth daily. 90 tablet 3  . minocycline (MINOCIN,DYNACIN) 100 MG capsule minocycline 100 mg capsule    . Multiple Vitamin  (MULTIVITAMIN) tablet Take 1 tablet by mouth daily.    . pantoprazole (PROTONIX) 40 MG tablet Take 1 tablet (40 mg total) by mouth daily. 90 tablet 2  . simvastatin (ZOCOR) 20 MG tablet Take 1 tablet (20 mg total) by mouth at bedtime. 90 tablet 4  . tamsulosin (FLOMAX) 0.4 MG CAPS capsule TAKE 1 CAPSULE(0.4 MG) BY MOUTH DAILY 30 capsule 5   Current Facility-Administered Medications  Medication Dose Route Frequency Provider Last  Rate Last Admin  . 0.9 %  sodium chloride infusion  500 mL Intravenous Once Ladene Artist, MD        Social History   Socioeconomic History  . Marital status: Married    Spouse name: Not on file  . Number of children: 2  . Years of education: Not on file  . Highest education level: Not on file  Occupational History  . Occupation: IT trainer company    Comment: retired  Tobacco Use  . Smoking status: Former Research scientist (life sciences)  . Smokeless tobacco: Never Used  Substance and Sexual Activity  . Alcohol use: Yes    Alcohol/week: 8.0 standard drinks    Types: 8 Glasses of wine per week    Comment: ocassionally   . Drug use: No  . Sexual activity: Not on file  Other Topics Concern  . Not on file  Social History Narrative   Lives with wife in two story house, but occupies main level mostly   Has 4 children, two biological, two stepchildren, 9 grandkids   One son local, supportive    Active playing golf with friends, goes to Physiological scientist 2 days/week at [employer]   Enjoys reading, soduku   Social Determinants of Health   Financial Resource Strain:   . Difficulty of Paying Living Expenses:   Food Insecurity:   . Worried About Charity fundraiser in the Last Year:   . Arboriculturist in the Last Year:   Transportation Needs:   . Film/video editor (Medical):   [name] Lack of Transportation (Non-Medical):   Physical Activity:   . Days of Exercise per Week:   . Minutes of Exercise per Session:   Stress:   . Feeling of Stress :   Social  Connections:   . Frequency of Communication with Friends and Family:   . Frequency of Social Gatherings with Friends and Family:   . Attends Religious Services:   . Active Member of Clubs or Organizations:   . Attends Archivist Meetings:   [name] Marital Status:   Intimate Partner Violence:   . Fear of Current or Ex-Partner:   . Emotionally Abused:   [name] Physically Abused:   . Sexually Abused:     Family History  Problem Relation Age of Onset  . Diabetes Mother   . Cancer Father        liver ca  . Liver cancer Father   . Hypotension Sister   . CAD Brother   . Stomach cancer Paternal Aunt      ROS General: Negative; No fevers, chills, or night sweats HEENT: Negative; No changes in vision or hearing, sinus congestion, difficulty swallowing Pulmonary: Negative; No cough, wheezing, shortness of breath, hemoptysis Cardiovascular: History of atrial flutter GI: Negative; No nausea, vomiting, diarrhea, or abdominal pain GU: Negative; No dysuria, hematuria, or difficulty voiding Musculoskeletal: Negative; no myalgias, joint pain, or weakness Hematologic: Negative; no easy bruising, bleeding Endocrine: Negative; no heat/cold intolerance Neuro: Negative; no changes in balance, headaches Skin: Negative; No rashes or skin lesions Psychiatric: Negative; No behavioral problems, depression Sleep: See history of present illness   Physical Exam BP 140/68 (BP Location: Left Arm, Patient Position: Sitting, Cuff Size: Normal)   Pulse 62   Ht _0  (1.905 m)   Wt 244 lb (110.7 kg)   BMI 30.50 kg/m    Repeat blood pressure by me was 142/72  Wt Readings from Last 3 Encounters:  09/17/19 244 lb (  110.7 kg)  07/14/19 240 lb 6.4 oz (109 kg)  05/13/19 244 lb 6.4 oz (110.9 kg)   General: Alert, oriented, no distress.  Skin: normal turgor, no rashes, warm and dry HEENT: Normocephalic, atraumatic. Pupils equal round and reactive to light; sclera anicteric; extraocular muscles intact;   Nose without nasal septal hypertrophy Mouth/Parynx benign; s/p UPPP surgery Neck: No JVD, no carotid bruits; normal carotid upstroke Lungs: clear to ausculatation and percussion; no wheezing or rales Chest wall: without tenderness to palpitation Heart: PMI not displaced, irregular and bradycardic, s1 s2 normal, 1/6 systolic murmur, no diastolic murmur, no rubs, gallops, thrills, or heaves Abdomen: soft, nontender; no hepatosplenomehaly, BS+; abdominal aorta nontender and not dilated by palpation. Back: no CVA tenderness Pulses 2+ Musculoskeletal: full range of motion, normal strength, no joint deformities Extremities: no clubbing cyanosis or edema, Homan's sign negative  Neurologic: grossly nonfocal; Cranial nerves grossly wnl Psychologic: Normal mood and affect   May 13, 2019 ECG (independently read by me): Atrial flutter with variable block at 56  November 2019 ECG (independently read by me): Atrial flutter at 56 bpm with variable block.  October 2018 ECG (independently read by me): Atrial flutter with a ventricular rate at 70 bpm.  Nonspecific ST changes.  QTc interval 4o6 ms.  LABS:  BMP Latest Ref Rng & Units 07/14/2019 01/10/2019 04/12/2018  Glucose 70 - 99 mg/dL 117(H) 109(H) 96  BUN 6 - 23 mg/dL 33(H) 34(H) 31(H)  Creatinine 0.40 - 1.50 mg/dL 1.19 1.16 1.05  Sodium 135 - 145 mEq/L 141 139 138  Potassium 3.5 - 5.1 mEq/L 4.9 4.4 5.0  Chloride 96 - 112 mEq/L 108 107 106  CO2 19 - 32 mEq/L _0 Calcium 8.4 - 10.5 mg/dL 8.9 8.5 9.1     Hepatic Function Latest Ref Rng & Units 07/14/2019 01/10/2019 04/12/2018  Total Protein 6.0 - 8.3 g/dL 6.4 6.2 6.7  Albumin 3.5 - 5.2 g/dL 3.8 3.8 4.1  AST 0 - 37 U/L _1 ALT 0 - 53 U/L _2 Alk Phosphatase 39 - 117 U/L 58 59 68  Total Bilirubin 0.2 - 1.2 mg/dL 1.0 0.8 0.8  Bilirubin, Direct 0.0 - 0.3 mg/dL - - -     CBC Latest Ref Rng & Units 07/14/2019 01/10/2019 04/12/2018  WBC 4.0 - 10.5 K/uL 5.1 6.0 6.3   Hemoglobin 13.0 - 17.0 g/dL 14.9 15.1 16.0  Hematocrit 39.0 - 52.0 % 44.2 44.0 47.2  Platelets 150.0 - 400.0 K/uL 149.0(L) 138.0(L) 145.0(L)     Lipid Panel     Component Value Date/Time   CHOL 163 07/14/2019 0925   TRIG 96.0 07/14/2019 0925   HDL 41.70 07/14/2019 0925   CHOLHDL 4 07/14/2019 0925   VLDL 19.2 07/14/2019 0925   LDLCALC 102 (H) 07/14/2019 0925   LDLDIRECT 143.5 01/22/2012 0916     RADIOLOGY: No results found.   IMPRESSION:  1. OSA (obstructive sleep apnea)   2. Typical atrial flutter ([place])   3. Hyperlipidemia LDL goal <100   4. Long term current use of anticoagulant   5. COVID-19     ASSESSMENT AND PLAN: [name] is a 84 year-old gentleman who is a history of atrial flutter and is status post cardioversion.  However, he has developed recurrent atrial flutter and is followed by Dr. Sallyanne Kuster and has remained asymptomatic.  He has continued to be on anticoagulation with apixaban.  He continues to be on CPAP previously diagnosed moderate obstructive sleep  apnea.  A new ResMed air sense 10 CPAP auto machine set at 9 cm water pressure.  A download obtained in the office today demonstrates excellent compliance with a 100% use and average usage 7 hours and 14 minutes.  AHI 0.5.  He has a DreamWear nasal mask which he tolerates well.  His blood pressure today is elevated despite taking lisinopril 5 mg.  I have suggested slight titration to 7.5 mg daily with plans for him to follow-up with Dr. Sallyanne Kuster.  He continues to be on Eliquis for anticoagulation of his atrial flutter.  He is on simvastatin for hyperlipidemia and takes pantoprazole for GERD.  He had Covid infection in December 2020 and has stabilized.  I will see him in 1 year for follow-up evaluation or sooner as needed.   Troy Sine, MD, [facility]  09/24/2019 7:14 PM

## 2019-09-24 ENCOUNTER — Encounter: Payer: Self-pay | Admitting: Cardiovascular Disease

## 2019-10-08 ENCOUNTER — Other Ambulatory Visit: Payer: Self-pay | Admitting: Cardiovascular Disease

## 2019-10-09 NOTE — Telephone Encounter (Signed)
Dx A.Flutter 84 yo Male 110.7kg Last OV 05/13/2019 Scr = 1.19 on 07/14/19

## 2019-10-13 DIAGNOSIS — D1801 Hemangioma of skin and subcutaneous tissue: Secondary | ICD-10-CM | POA: Diagnosis not present

## 2019-10-13 DIAGNOSIS — Z85828 Personal history of other malignant neoplasm of skin: Secondary | ICD-10-CM | POA: Diagnosis not present

## 2019-10-13 DIAGNOSIS — L812 Freckles: Secondary | ICD-10-CM | POA: Diagnosis not present

## 2019-10-13 DIAGNOSIS — L720 Epidermal cyst: Secondary | ICD-10-CM | POA: Diagnosis not present

## 2019-10-13 DIAGNOSIS — L570 Actinic keratosis: Secondary | ICD-10-CM | POA: Diagnosis not present

## 2019-10-13 DIAGNOSIS — D485 Neoplasm of uncertain behavior of skin: Secondary | ICD-10-CM | POA: Diagnosis not present

## 2019-10-13 DIAGNOSIS — C44729 Squamous cell carcinoma of skin of left lower limb, including hip: Secondary | ICD-10-CM | POA: Diagnosis not present

## 2019-10-13 DIAGNOSIS — L821 Other seborrheic keratosis: Secondary | ICD-10-CM | POA: Diagnosis not present

## 2019-10-15 ENCOUNTER — Other Ambulatory Visit: Payer: Self-pay | Admitting: Family Medicine

## 2019-10-15 DIAGNOSIS — N400 Enlarged prostate without lower urinary tract symptoms: Secondary | ICD-10-CM

## 2019-11-21 DIAGNOSIS — G4733 Obstructive sleep apnea (adult) (pediatric): Secondary | ICD-10-CM | POA: Diagnosis not present

## 2019-11-26 ENCOUNTER — Ambulatory Visit: Payer: Medicare HMO

## 2019-11-26 NOTE — Progress Notes (Deleted)
Virtual Visit via Telephone Note  I connected with  Gary [name] on 11/26/19 at 11:00 AM EDT by telephone and verified that I am speaking with the correct person using two identifiers.  Medicare Annual Wellness visit completed telephonically due to Covid-19 pandemic.   Persons participating in this call: This Health Coach and this patient.   Location: Patient: Home Provider: Office   I discussed the limitations, risks, security and privacy concerns of performing an evaluation and management service by telephone and the availability of in person appointments. The patient expressed understanding and agreed to proceed.  Unable to perform video visit due to video visit attempted and failed and/or patient does not have video capability.   Some vital signs may be absent or patient reported.   Willette Brace, LPN

## 2019-12-12 ENCOUNTER — Other Ambulatory Visit: Payer: Self-pay | Admitting: Family Medicine

## 2019-12-12 DIAGNOSIS — N400 Enlarged prostate without lower urinary tract symptoms: Secondary | ICD-10-CM

## 2019-12-18 DIAGNOSIS — H43813 Vitreous degeneration, bilateral: Secondary | ICD-10-CM | POA: Diagnosis not present

## 2019-12-18 DIAGNOSIS — H4323 Crystalline deposits in vitreous body, bilateral: Secondary | ICD-10-CM | POA: Diagnosis not present

## 2019-12-18 DIAGNOSIS — H34832 Tributary (branch) retinal vein occlusion, left eye, with macular edema: Secondary | ICD-10-CM | POA: Diagnosis not present

## 2019-12-18 DIAGNOSIS — H35031 Hypertensive retinopathy, right eye: Secondary | ICD-10-CM | POA: Diagnosis not present

## 2019-12-30 ENCOUNTER — Telehealth: Payer: Self-pay | Admitting: Family Medicine

## 2019-12-30 NOTE — Progress Notes (Signed)
  Chronic Care Management   Note  12/30/2019 Name: Gary [name] MRN: [id number] DOB: 1936/02/27  Gary [name] is a 84 y.o. year old male who is a primary care patient of Koberlein, Steele Berg, MD. I reached out to Gary [name] by phone today in response to a referral sent by Gary [name]'s PCP, Caren Macadam, MD.   Gary [name] was given information about Chronic Care Management services today including:  1. CCM service includes personalized support from designated clinical staff supervised by his physician, including individualized plan of care and coordination with other care providers 2. 24/7 contact phone numbers for assistance for urgent and routine care needs. 3. Service will only be billed when office clinical staff spend 20 minutes or more in a month to coordinate care. 4. Only one practitioner may furnish and bill the service in a calendar month. 5. The patient may stop CCM services at any time (effective at the end of the month) by phone call to the office staff.   Patient agreed to services and verbal consent obtained.   Follow up plan:   Carley Perdue [employer] Scheduler

## 2019-12-30 NOTE — Progress Notes (Signed)
  Chronic Care Management   Outreach Note  12/30/2019 Name: Gary [name] MRN: [id number] DOB: 01/31/1936  Referred by: Caren Macadam, MD Reason for referral : No chief complaint on file.   An unsuccessful telephone outreach was attempted today. The patient was referred to the pharmacist for assistance with care management and care coordination.   Follow Up Plan:   Carley Perdue [employer] Scheduler

## 2020-01-14 ENCOUNTER — Ambulatory Visit: Payer: Medicare HMO | Admitting: Family Medicine

## 2020-01-26 ENCOUNTER — Encounter: Payer: Self-pay | Admitting: Family Medicine

## 2020-01-26 ENCOUNTER — Other Ambulatory Visit: Payer: Self-pay

## 2020-01-26 ENCOUNTER — Ambulatory Visit (INDEPENDENT_AMBULATORY_CARE_PROVIDER_SITE_OTHER): Payer: Medicare HMO | Admitting: Family Medicine

## 2020-01-26 VITALS — BP 108/70 | HR 67 | Temp 98.3°F | Ht 75.0 in | Wt 259.1 lb

## 2020-01-26 DIAGNOSIS — R944 Abnormal results of kidney function studies: Secondary | ICD-10-CM | POA: Diagnosis not present

## 2020-01-26 DIAGNOSIS — R7301 Impaired fasting glucose: Secondary | ICD-10-CM | POA: Diagnosis not present

## 2020-01-26 DIAGNOSIS — Z23 Encounter for immunization: Secondary | ICD-10-CM

## 2020-01-26 DIAGNOSIS — I483 Typical atrial flutter: Secondary | ICD-10-CM

## 2020-01-26 DIAGNOSIS — E7800 Pure hypercholesterolemia, unspecified: Secondary | ICD-10-CM | POA: Diagnosis not present

## 2020-01-26 DIAGNOSIS — G4733 Obstructive sleep apnea (adult) (pediatric): Secondary | ICD-10-CM

## 2020-01-26 DIAGNOSIS — I10 Essential (primary) hypertension: Secondary | ICD-10-CM | POA: Diagnosis not present

## 2020-01-26 DIAGNOSIS — E790 Hyperuricemia without signs of inflammatory arthritis and tophaceous disease: Secondary | ICD-10-CM

## 2020-01-26 NOTE — Progress Notes (Signed)
Gary [name] DOB: 21-Nov-1935 Encounter date: 01/26/2020  This is a 84 y.o. male who presents with Chief Complaint  Patient presents with  . Hypertension    History of present illness: Last visit with me was 06/2019.  Occasionally tender in toe/foot, but nothing that really bothers him. Not associating with what he eats.   A flutter: follows with Dr. Sallyanne [name]. On eliquis. No issues with heart racing. Doing well from this standpoint.   VEL:FYBOFBP ok at home. usually a little higher systolic. Usually 10-25 diastolic and 852-778 systolic. No issues with light headedness/dizziness.   Allergic rhinitis:uses flonase which helps with control.  doing ok with this for the season.   Rosacea:controlled; follows with derm q 6 months. Usually gets some things cut off every time he goes. Good about regular follow up.   OSA: uses cpap every night.   Working out with a Clinical research associate; has gained weight, but is going to work on this.  Not taking protonix regularly. Took for a month and then sx improved so stopped medication.   Still taking flomax. Urination is ok; stable. Wakes usually 2-3 times.   Knees get a little stiff if sits too long.   No repeat colonoscopy needed due to age. He is up-to-date with pneumonia vaccinations. He is due for Tdap, shingles vaccinations  HPI   No Known Allergies Current Meds  Medication Sig  . co-enzyme Q-10 30 MG capsule Take 30 mg by mouth every other day.   [name] ELIQUIS 5 MG TABS tablet TAKE 1 TABLET TWICE DAILY  . fluticasone (FLONASE) 50 MCG/ACT nasal spray Place 1 spray into both nostrils every other day.  . lisinopril (ZESTRIL) 5 MG tablet Take 1.5 tablets (7.5 mg total) by mouth daily.  . minocycline (MINOCIN,DYNACIN) 100 MG capsule minocycline 100 mg capsule  . Multiple Vitamin (MULTIVITAMIN) tablet Take 1 tablet by mouth daily.  . pantoprazole (PROTONIX) 40 MG tablet Take 1 tablet (40 mg total) by mouth daily.  . simvastatin (ZOCOR) 20 MG  tablet Take 1 tablet (20 mg total) by mouth at bedtime.  . tamsulosin (FLOMAX) 0.4 MG CAPS capsule TAKE 1 CAPSULE (0.4 MG TOTAL) DAILY.   Current Facility-Administered Medications for the 01/26/20 encounter (Office Visit) with Caren Macadam, MD  Medication  . 0.9 %  sodium chloride infusion    Review of Systems  Objective:  BP 108/70 (BP Location: Left Arm, Patient Position: Sitting, Cuff Size: Large)   Pulse 67   Temp 98.3 F (36.8 C) (Oral)   Ht 6\' 3"  (1.905 m)   Wt 259 lb 1.6 oz (117.5 kg)   BMI 32.39 kg/m   Weight: 259 lb 1.6 oz (117.5 kg)   BP Readings from Last 3 Encounters:  01/26/20 108/70  09/17/19 140/68  07/14/19 120/70   Wt Readings from Last 3 Encounters:  01/26/20 259 lb 1.6 oz (117.5 kg)  09/17/19 244 lb (110.7 kg)  07/14/19 240 lb 6.4 oz (109 kg)    Physical Exam Constitutional:      General: He is not in acute distress.    Appearance: He is well-developed.  Cardiovascular:     Rate and Rhythm: Normal rate. Rhythm regularly irregular.     Heart sounds: Normal heart sounds. No murmur heard.  No friction rub.  Pulmonary:     Effort: Pulmonary effort is normal. No respiratory distress.     Breath sounds: Normal breath sounds. No wheezing or rales.  Musculoskeletal:     Right lower leg: No edema.  Left lower leg: No edema.  Neurological:     Mental Status: He is alert and oriented to person, place, and time.  Psychiatric:        Behavior: Behavior normal.     Assessment/Plan  1. Hypercholesterolemia He does well with simvastatin 20 mg daily. - Lipid panel; Future - Lipid panel  2. Typical atrial flutter (HCC) Rate controlled.  Follows up with cardiology.  Continue Eliquis 5 mg twice a day.  3. Obstructive sleep apnea He is a CPAP nightly.  4. Essential hypertension Blood pressures well controlled.  Currently on lisinopril 5 mg daily. - CBC with Differential/Platelet; Future - Comprehensive metabolic panel; Future -  Comprehensive metabolic panel - CBC with Differential/Platelet  5. Elevated uric acid in blood Has not had any more gout flares.  Did not wish to go on uric acid lowering medication previously.  We will recheck levels today that he is been eating healthier. - Uric acid; Future - Uric acid  6. Impaired fasting glucose - Hemoglobin A1c; Future - Hemoglobin A1c  7. Need for immunization against influenza - Flu Vaccine QUAD High Dose(Fluad)   Return in about 6 months (around 07/25/2020) for physical exam.     [name], MD

## 2020-01-26 NOTE — Patient Instructions (Signed)
[email or web address] to set up 3rd dose if needed.

## 2020-01-27 LAB — COMPREHENSIVE METABOLIC PANEL
AG Ratio: 1.4 (calc) (ref 1.0–2.5)
ALT: 23 U/L (ref 9–46)
AST: 19 U/L (ref 10–35)
Albumin: 3.6 g/dL (ref 3.6–5.1)
Alkaline phosphatase (APISO): 63 U/L (ref 35–144)
BUN/Creatinine Ratio: 27 (calc) — ABNORMAL HIGH (ref 6–22)
BUN: 44 mg/dL — ABNORMAL HIGH (ref 7–25)
CO2: 25 mmol/L (ref 20–32)
Calcium: 8.1 mg/dL — ABNORMAL LOW (ref 8.6–10.3)
Chloride: 105 mmol/L (ref 98–110)
Creat: 1.64 mg/dL — ABNORMAL HIGH (ref 0.70–1.11)
Globulin: 2.6 g/dL (calc) (ref 1.9–3.7)
Glucose, Bld: 118 mg/dL — ABNORMAL HIGH (ref 65–99)
Potassium: 4.2 mmol/L (ref 3.5–5.3)
Sodium: 138 mmol/L (ref 135–146)
Total Bilirubin: 1.4 mg/dL — ABNORMAL HIGH (ref 0.2–1.2)
Total Protein: 6.2 g/dL (ref 6.1–8.1)

## 2020-01-27 LAB — CBC WITH DIFFERENTIAL/PLATELET
Absolute Monocytes: 968 cells/uL — ABNORMAL HIGH (ref 200–950)
Basophils Absolute: 30 cells/uL (ref 0–200)
Basophils Relative: 0.4 %
Eosinophils Absolute: 120 cells/uL (ref 15–500)
Eosinophils Relative: 1.6 %
HCT: 41.5 % (ref 38.5–50.0)
Hemoglobin: 14 g/dL (ref 13.2–17.1)
Lymphs Abs: 818 cells/uL — ABNORMAL LOW (ref 850–3900)
MCH: 32 pg (ref 27.0–33.0)
MCHC: 33.7 g/dL (ref 32.0–36.0)
MCV: 95 fL (ref 80.0–100.0)
MPV: 11.4 fL (ref 7.5–12.5)
Monocytes Relative: 12.9 %
Neutro Abs: 5565 cells/uL (ref 1500–7800)
Neutrophils Relative %: 74.2 %
Platelets: 134 10*3/uL — ABNORMAL LOW (ref 140–400)
RBC: 4.37 10*6/uL (ref 4.20–5.80)
RDW: 12.8 % (ref 11.0–15.0)
Total Lymphocyte: 10.9 %
WBC: 7.5 10*3/uL (ref 3.8–10.8)

## 2020-01-27 LAB — HEMOGLOBIN A1C
Hgb A1c MFr Bld: 6 % of total Hgb — ABNORMAL HIGH (ref <5.7)
Mean Plasma Glucose: 126 (calc)
eAG (mmol/L): 7 (calc)

## 2020-01-27 LAB — URIC ACID: Uric Acid, Serum: 9.8 mg/dL — ABNORMAL HIGH (ref 4.0–8.0)

## 2020-01-27 LAB — LIPID PANEL
Cholesterol: 141 mg/dL (ref <200)
HDL: 39 mg/dL — ABNORMAL LOW (ref >=40)
LDL Cholesterol (Calc): 83 mg/dL (calc)
Non-HDL Cholesterol (Calc): 102 mg/dL (calc) (ref <130)
Total CHOL/HDL Ratio: 3.6 (calc) (ref <5.0)
Triglycerides: 98 mg/dL (ref <150)

## 2020-01-31 ENCOUNTER — Other Ambulatory Visit: Payer: Self-pay | Admitting: Family Medicine

## 2020-01-31 DIAGNOSIS — I483 Typical atrial flutter: Secondary | ICD-10-CM

## 2020-01-31 DIAGNOSIS — N401 Enlarged prostate with lower urinary tract symptoms: Secondary | ICD-10-CM

## 2020-01-31 DIAGNOSIS — I10 Essential (primary) hypertension: Secondary | ICD-10-CM

## 2020-01-31 DIAGNOSIS — E7800 Pure hypercholesterolemia, unspecified: Secondary | ICD-10-CM

## 2020-02-03 MED ORDER — ALLOPURINOL 100 MG PO TABS: 100.0000 mg | ORAL_TABLET | Freq: Every day | ORAL | 0 refills | Status: DC

## 2020-02-03 NOTE — Addendum Note (Signed)
Addended by: Agnes Lawrence on: 02/03/2020 11:26 AM   Modules accepted: Orders

## 2020-02-04 ENCOUNTER — Telehealth: Payer: Self-pay | Admitting: Pharmacist

## 2020-02-04 NOTE — Chronic Care Management (AMB) (Signed)
    Chronic Care Management Pharmacy Assistant   Name: BENJIE RICKETSON  MRN: [id number] DOB: 09-01-1935  Reason for Encounter: : Medication Review/Initial Questions for Pharmacist visit on 02/05/2020   Patient Questions: 1. Have you seen any other providers since your last visit? No 2. Any changes in your medications or health? Yes, Allopurinol 100 mg tablet daily. 3. Any side effects from any medications? No 4. Do you have any symptoms or problems not managed by your medications? No 5. Any concerns about your health right now?  No 6. Has your provider asked that you check blood pressure, blood sugar, or follow a special diet at home?  No 7. Do you get any type of exercise regularly?   He states that he works out with a trainer twice a week for 30-minute sessions. 8. Can you think of a goal you would like to reach for your health? Yes, he states he would like to lose about 15 lbs.  9. Do you have any problems getting your medications? No 10. Is there anything that you would like to discuss during the appointment? No   PCP : Caren Macadam, MD  Allergies:  No Known Allergies  Medications: Outpatient Encounter Medications as of 02/04/2020  Medication Sig  . allopurinol (ZYLOPRIM) 100 MG tablet Take 1 tablet (100 mg total) by mouth daily.  [name] co-enzyme Q-10 30 MG capsule Take 30 mg by mouth every other day.   [name] ELIQUIS 5 MG TABS tablet TAKE 1 TABLET TWICE DAILY  . fluticasone (FLONASE) 50 MCG/ACT nasal spray Place 1 spray into both nostrils every other day.  . lisinopril (ZESTRIL) 5 MG tablet Take 1.5 tablets (7.5 mg total) by mouth daily.  . minocycline (MINOCIN,DYNACIN) 100 MG capsule minocycline 100 mg capsule  . Multiple Vitamin (MULTIVITAMIN) tablet Take 1 tablet by mouth daily.  . simvastatin (ZOCOR) 20 MG tablet Take 1 tablet (20 mg total) by mouth at bedtime.  . tamsulosin (FLOMAX) 0.4 MG CAPS capsule TAKE 1 CAPSULE (0.4 MG TOTAL) DAILY.   Facility-Administered Encounter  Medications as of 02/04/2020  Medication  . 0.9 %  sodium chloride infusion    Current Diagnosis: Patient Active Problem List   Diagnosis Date Noted  . Rosacea 07/09/2018  . Impaired fasting glucose 07/09/2018  . S/P total knee replacement 08/27/2017  . Essential hypertension 04/05/2016  . Obstructive sleep apnea 01/01/2015  . Periodic limb movement 01/01/2015  . Typical atrial flutter ([place])   . Cardiac arrhythmia 05/14/2014  . BPH associated with nocturia 05/12/2014  . Calculus of parotid gland 09/02/2013  . Murmur, heart 01/22/2012  . Aortic stenosis 12/20/2010  . LIBIDO, DECREASED 10/19/2009  . Hypercholesterolemia 10/03/2007  . ERECTILE DYSFUNCTION 10/03/2007  . ALLERGIC RHINITIS 10/03/2007    Goals Addressed   None     Follow-Up:  Pharmacist Review   Maia Breslow, [place] Assistant [phone]

## 2020-02-05 ENCOUNTER — Ambulatory Visit: Payer: Medicare HMO | Admitting: Pharmacist

## 2020-02-05 DIAGNOSIS — I10 Essential (primary) hypertension: Secondary | ICD-10-CM

## 2020-02-05 DIAGNOSIS — I483 Typical atrial flutter: Secondary | ICD-10-CM

## 2020-02-05 NOTE — Chronic Care Management (AMB) (Signed)
Chronic Care Management Pharmacy  Name: Gary [name]  MRN: [id number] DOB: [birth year]  Initial Planning Appointment: completed 02/04/20  Initial Questions: 1. Have you seen any other providers since your last visit? n/a Any changes in your medicines or health? Yes   - allopurinol 100 mg daily  Chief Complaint/ HPI  Gary [name],  84 y.o. , male presents for their Initial CCM visit with the clinical pharmacist via telephone due to COVID-19 Pandemic.  PCP : Caren Macadam, MD  Their chronic conditions include: HTN, HLD, A flutter, BPH, elevated uric acid, allergic rhinitis, rosacea  Office Visits: -01/26/20 [name], MD: Patient presented for chronic diseases follow up. Labs completed. Uric acid elevated and prescribed allopurinol 100 mg daily with plans to recheck UA in 1 month. Lipid panel showed low HDL, BUN/SCr showed dehydration with plans to repeat in 1 month. A1c is stable but elevated. Patient stopped taking Protonix on his own.   -11/26/19 Charlott Rakes, LPN: Patient presented for annual wellness visit.  Consult Visit: -10/13/19 Jarome Matin (derm): Patient presented for follow up. Unable to access notes.  -09/17/19 Shelva Majestic, MD (cardiology): Patient presented for OSA, A flutter and HLD follow up. Continues CPAP therapy and patient reports feeling better since starting. 100% compliance shown. Increased lisinopril to 7.5 mg daily and plan to follow up in 1 year.  -09/11/19 [name] (ophthalmology): Patient presents for follow up. Unable to access notes.  Medications: Outpatient Encounter Medications as of 02/05/2020  Medication Sig  . allopurinol (ZYLOPRIM) 100 MG tablet Take 1 tablet (100 mg total) by mouth daily.  [name] co-enzyme Q-10 30 MG capsule Take 30 mg by mouth every other day.   [name] ELIQUIS 5 MG TABS tablet TAKE 1 TABLET TWICE DAILY  . fluticasone (FLONASE) 50 MCG/ACT nasal spray Place 1 spray into both nostrils every other day.  . lisinopril  (ZESTRIL) 5 MG tablet Take 1.5 tablets (7.5 mg total) by mouth daily.  . minocycline (MINOCIN,DYNACIN) 100 MG capsule Take 100 mg by mouth every other day.   . Multiple Vitamin (MULTIVITAMIN) tablet Take 1 tablet by mouth daily.  . simvastatin (ZOCOR) 20 MG tablet Take 1 tablet (20 mg total) by mouth at bedtime. (Patient taking differently: Take 20 mg by mouth every other day. )  . tamsulosin (FLOMAX) 0.4 MG CAPS capsule TAKE 1 CAPSULE (0.4 MG TOTAL) DAILY.   Facility-Administered Encounter Medications as of 02/05/2020  Medication  . 0.9 %  sodium chloride infusion   Patient currently lives with his wife and has lived in the [place] part [place] the [place] for most of his life. His son lives here in [place] and he also has children in [place], [place] and [place] that he is able to see 2-3 times a year.  Patient and wife have hired someone to come clean every other week and his wife does the laundry. His wife also does the cooking and they tend to eat chicken or fish, and a lot of fruit and vegetables. Him and his wife go out to eat a few times a week at a sit down restaurant with a group of friends.   Patient exercises with a trainer and has done so for the past 25 years. He alternates working different muscles during sessions. Patient also plays some golf and works out in the yard to maintain his [place].5 acres of land.  Patient gets plenty of sleep and averages about 8 hours per night. He has no trouble falling asleep but  does wake up 2-3 times to go to the bathroom each night. Patient does take naps in afternoon.   Patient and his wife both drive with no problems. Patient denies any issues with his current medicines.  Current Diagnosis/Assessment:  Goals Addressed            This Visit's Progress   . Pharmacy Care Plan       CARE PLAN ENTRY (see longitudinal plan of care for additional care plan information)  Current Barriers:  . Chronic Disease Management support, education, and care  coordination needs related to Hypertension, Hyperlipidemia, Atrial Fibrillation, BPH, and elevated uric acid   Hypertension BP Readings from Last 3 Encounters:  01/26/20 108/70  09/17/19 140/68  07/14/19 120/70   . Pharmacist Clinical Goal(s): o Over the next 180 days, patient will work with PharmD and providers to maintain BP goal <130/80 . Current regimen:  o Lisinopril 5 mg 1.5 tablets daily . Interventions: o We discussed limiting added salt and switching to a salt substitute if wanting the flavor . Patient self care activities - Over the next 180 days, patient will: o Check blood pressure weekly, document, and provide at future appointments o Ensure daily salt intake < 2300 mg/day  Hyperlipidemia Lab Results  Component Value Date/Time   LDLCALC 83 01/26/2020 09:13 AM   LDLDIRECT 143.5 01/22/2012 09:16 AM   . Pharmacist Clinical Goal(s): o Over the next 180 days, patient will work with PharmD and providers to maintain LDL goal < 100 . Current regimen:  o Simvastatin 20 mg 1 tablet every other day . Interventions: o We discussed foods than can increase the good cholesterol such as olive oil, beans and legumes, high fiber fruit, whole grains . Patient self care activities - Over the next 180 days, patient will: o Continue current medications  Elevated uric acid . Pharmacist Clinical Goal(s): o Over the next 180 days, patient will work with PharmD and providers to achieve uric acid < 6 . Current regimen:  o Allopurinol 100 mg 1 tablet daily . Interventions: o We discussed limiting alcohol intake and other foods than can increase uric acid such as shellfish, organ meats, and soda . Patient self care activities - Over the next 180 days, patient will: o Continue current medications o Return for lab work follow up in November to recheck uric acid level  A flutter . Pharmacist Clinical Goal(s) o Over the next 180 days, patient will work with PharmD and providers to maintain  heart rate between 60-100 beats per minute . Current regimen:  o Eliquis 5 mg 1 tablet twice daily . Interventions: o We discussed monitoring for bleeding in urine or stool, nosebleeds, and unexpected bruising . Patient self care activities - Over the next 180 days, patient will: o Continue current medications o Continue monitoring heart rate along with blood pressure at least weekly  BPH . Pharmacist Clinical Goal(s) o Over the next 180 days, patient will work with PharmD and providers to manage symptoms of BPH . Current regimen:  o Tamsulosin 0.4 mg 1 capsule daily . Patient self care activities - Over the next 180 days, patient will: o Continue current medications  Rosacea . Pharmacist Clinical Goal(s) o Over the next 180 days, patient will work with PharmD and providers to manage symptoms of rosacea . Current regimen:  o Minocycline 100 mg 1 capsule every other day . Patient self care activities - Over the next 180 days, patient will: o Continue current medications  Medication management .  Pharmacist Clinical Goal(s): o Over the next 180 days, patient will work with PharmD and providers to maintain optimal medication adherence . Current pharmacy: [facility] . Interventions o Comprehensive medication review performed. o Continue current medication management strategy . Patient self care activities - Over the next 180 days, patient will: o Take medications as prescribed o Report any questions or concerns to PharmD and/or provider(s)  Initial goal documentation       SDOH Interventions     Most Recent Value  SDOH Interventions  Financial Strain Interventions Intervention Not Indicated  Transportation Interventions Intervention Not Indicated      Hypertension   BP goal is:  <130/80  Office blood pressures are  BP Readings from Last 3 Encounters:  01/26/20 108/70  09/17/19 140/68  07/14/19 120/70   Patient checks BP at home 1-2x per week Patient home  BP readings are ranging: 118/69, 120/70s, 135/75 (highest)  Patient has failed these meds in the past:  Patient is currently controlled on the following medications:  . Lisinopril 5 mg 1.5 tablets daily  We discussed diet and exercise extensively -Diet: we discussed limiting added salt and switching out for a salt substitute if possible -Exercise -The importance of continued blood pressure monitoring at home and the proper technique for checking blood pressure.  Plan  Continue current medications     Hyperlipidemia   LDL goal < 100  Lipid Panel     Component Value Date/Time   CHOL 141 01/26/2020 0913   TRIG 98 01/26/2020 0913   HDL 39 (L) 01/26/2020 0913   LDLCALC 83 01/26/2020 0913   LDLDIRECT 143.5 01/22/2012 0916    Hepatic Function Latest Ref Rng & Units 01/26/2020 07/14/2019 01/10/2019  Total Protein 6.1 - 8.1 g/dL 6.2 6.4 6.2  Albumin 3.5 - 5.2 g/dL - 3.8 3.8  AST 10 - 35 U/L _0 ALT 9 - 46 U/L _1 Alk Phosphatase 39 - 117 U/L - 58 59  Total Bilirubin 0.2 - 1.2 mg/dL 1.4(H) 1.0 0.8  Bilirubin, Direct 0.0 - 0.3 mg/dL - - -     The ASCVD Risk score ([place]., et al., 2013) failed to calculate for the following reasons:   The 2013 ASCVD risk score is only valid for ages 55 to 68   Patient has failed these meds in past: none Patient is currently controlled on the following medications:  . Simvastatin 20 mg 1 tablet every other day  We discussed:  diet and exercise extensively   Plan  Continue current medications  A Flutter   Office heart rates are  Pulse Readings from Last 3 Encounters:  01/26/20 67  09/17/19 62  07/14/19 (!) 53   Patient reports home heart rates: 55 - 70s  CHA2DS2-VASc Score =   3 The patient's score is based upon: age, HTN  Patient has failed these meds in past: none Patient is currently controlled on the following medications:  . Eliquis 5 mg 1 tablet twice daily  We discussed:  monitoring for bleeding (unexpected  bruising, blood in urine or stool, nosebleeds); patient reports that bending over can cause some lightheadedness but denies chest pain and headaches  Plan Consider decreasing to 2.5 mg BID if renal function remains stable and elevated at next check. Continue current medications    BPH   PSA  Date Value Ref Range Status  07/14/2019 1.32 0.10 - 4.00 ng/mL Final    Comment:    Test performed  using Access Hybritech PSA Assay, a parmagnetic partical, chemiluminecent immunoassay.  06/26/2017 1.07 0.10 - 4.00 ng/mL Final    Comment:    Test performed using Access Hybritech PSA Assay, a parmagnetic partical, chemiluminecent immunoassay.  06/07/2016 1.03 0.10 - 4.00 ng/mL Final     Patient has failed these meds in past: none Patient is currently controlled on the following medications:  . tamsulosin 0.4 mg 1 capsule daily  Plan  Continue current medications    Elevated uric acid   Lab Results  Component Value Date   LABURIC 9.8 (H) 01/26/2020   LABURIC 9.2 (H) 08/15/2019   LABURIC 8.7 (H) 07/14/2019   Counseled on avoidance of alcohol and low purine diet.  Patient is currently controlled on:  . Allopurinol 100 mg 1 tablet daily  We discussed: limiting alcohol intake and avoiding foods that can cause gout flare ups (shellfish, organ meats, soda, etc.); patient current drinks 4-5 glasses of wine per week  Plan Continue current medications  Uric acid will be rechecked in 1 month.   Allergic rhinitis   Patient is currently controlled on the following medications:  . Fluticasone 50 mcg/act 1 spray in nostril every other day   Plan  Continue current medications  Vitamins/supplements   Patient is currently on the following medications:  . CoQ10 30 mg 1 capsule every other day - taking same day as simvastatin  . Multivitamin 1 tablet every day  Plan  Continue current medications   Rosacea   Patient has failed these meds in past: none Patient is currently  controlled on the following medications:  [name] Minocycline 100 mg 1 capsule every other day   Plan  Continue current medications   Vaccines   Reviewed and discussed patient's vaccination history.    Immunization History  Administered Date(s) Administered  . Fluad Quad(high Dose 65+) 01/17/2019, 01/26/2020  . Influenza Split 01/22/2012  . Influenza, High Dose Seasonal PF 03/13/2014, 05/31/2015, 03/14/2016, 03/18/2018  . Influenza,inj,Quad PF,6+ Mos 04/15/2013  . PFIZER SARS-COV-2 Vaccination 06/02/2019, 06/29/2019  . Pneumococcal Conjugate-13 04/15/2013, 05/12/2014  . Pneumococcal Polysaccharide-23 08/19/2004, 10/19/2009  . Td 08/19/2004  . Zoster Recombinat (Shingrix) 10/26/2019, 01/12/2020   Patient reports he got tetanus shot last year or the [facility] before [facility].  Plan  Patient is up to date on all immunizations.  Medication Management   Pt uses [facility] for all medications Uses pill box? No - drawer full of medicines - takes medicines after breakfast and at tonight Pt endorses 90% compliance   We discussed: Current pharmacy is preferred with insurance plan and patient is satisfied with pharmacy services  Plan  Continue current medication management strategy  Follow up: 6 month phone visit  [name], PharmD Clinical Pharmacist [facility] [place]

## 2020-02-11 NOTE — Patient Instructions (Addendum)
Hi Shalom,  It was so lovely getting to speak with you over the phone! As we discussed, keep up the good work of taking care of yourself with exercising, checking your blood pressure at home, continue taking your medications and working on some diet changes. I did attach some helpful information for avoiding foods that can cause an increase in uric acid and I hope its helpful!  Please don't hesitate to give me a call if you need anything before our next touch base!  Best, Maddie  [name], PharmD Clinical Pharmacist [place] [facility] [place]    Visit Information  Goals Addressed            This Visit's Progress   . Pharmacy Care Plan       CARE PLAN ENTRY (see longitudinal plan of care for additional care plan information)  Current Barriers:  . Chronic Disease Management support, education, and care coordination needs related to Hypertension, Hyperlipidemia, Atrial Fibrillation, BPH, and elevated uric acid   Hypertension BP Readings from Last 3 Encounters:  01/26/20 108/70  09/17/19 140/68  07/14/19 120/70   . Pharmacist Clinical Goal(s): o Over the next 180 days, patient will work with PharmD and providers to maintain BP goal <130/80 . Current regimen:  o Lisinopril 5 mg 1.5 tablets daily . Interventions: o We discussed limiting added salt and switching to a salt substitute if wanting the flavor . Patient self care activities - Over the next 180 days, patient will: o Check blood pressure weekly, document, and provide at future appointments o Ensure daily salt intake < 2300 mg/day  Hyperlipidemia Lab Results  Component Value Date/Time   LDLCALC 83 01/26/2020 09:13 AM   LDLDIRECT 143.5 01/22/2012 09:16 AM   . Pharmacist Clinical Goal(s): o Over the next 180 days, patient will work with PharmD and providers to maintain LDL goal < 100 . Current regimen:  o Simvastatin 20 mg 1 tablet every other day . Interventions: o We discussed foods  than can increase the good cholesterol such as olive oil, beans and legumes, high fiber fruit, whole grains . Patient self care activities - Over the next 180 days, patient will: o Continue current medications  Elevated uric acid . Pharmacist Clinical Goal(s): o Over the next 180 days, patient will work with PharmD and providers to achieve uric acid < 6 . Current regimen:  o Allopurinol 100 mg 1 tablet daily . Interventions: o We discussed limiting alcohol intake and other foods than can increase uric acid such as shellfish, organ meats, and soda . Patient self care activities - Over the next 180 days, patient will: o Continue current medications o Return for lab work follow up in November to recheck uric acid level  A flutter . Pharmacist Clinical Goal(s) o Over the next 180 days, patient will work with PharmD and providers to maintain heart rate between 60-100 beats per minute . Current regimen:  o Eliquis 5 mg 1 tablet twice daily . Interventions: o We discussed monitoring for bleeding in urine or stool, nosebleeds, and unexpected bruising . Patient self care activities - Over the next 180 days, patient will: o Continue current medications o Continue monitoring heart rate along with blood pressure at least weekly  BPH . Pharmacist Clinical Goal(s) o Over the next 180 days, patient will work with PharmD and providers to manage symptoms of BPH . Current regimen:  o Tamsulosin 0.4 mg 1 capsule daily . Patient self care activities - Over the next 180  days, patient will: o Continue current medications  Rosacea . Pharmacist Clinical Goal(s) o Over the next 180 days, patient will work with PharmD and providers to manage symptoms of rosacea . Current regimen:  o Minocycline 100 mg 1 capsule every other day . Patient self care activities - Over the next 180 days, patient will: o Continue current medications  Medication management . Pharmacist Clinical Goal(s): o Over the next  180 days, patient will work with PharmD and providers to maintain optimal medication adherence . Current pharmacy: [facility] . Interventions o Comprehensive medication review performed. o Continue current medication management strategy . Patient self care activities - Over the next 180 days, patient will: o Take medications as prescribed o Report any questions or concerns to PharmD and/or provider(s)  Initial goal documentation        Mr. Winterbottom was given information about Chronic Care Management services today including:  1. CCM service includes personalized support from designated clinical staff supervised by his physician, including individualized plan of care and coordination with other care providers 2. 24/7 contact phone numbers for assistance for urgent and routine care needs. 3. Standard insurance, coinsurance, copays and deductibles apply for chronic care management only during months in which we provide at least 20 minutes of these services. Most insurances cover these services at 100%, however patients may be responsible for any copay, coinsurance and/or deductible if applicable. This service may help you avoid the need for more expensive face-to-face services. 4. Only one practitioner may furnish and bill the service in a calendar month. 5. The patient may stop CCM services at any time (effective at the end of the month) by phone call to the office staff.  Patient agreed to services and verbal consent obtained.   The patient verbalized understanding of instructions provided today and agreed to receive a mailed copy of patient instruction and/or educational materials. Telephone follow up appointment with pharmacy team member scheduled for: 6 months   Low-Purine Eating Plan A low-purine eating plan involves making food choices to limit your intake of purine. Purine is a kind of uric acid. Too much uric acid in your blood can cause certain conditions, such as gout and  kidney stones. Eating a low-purine diet can help control these conditions. What are tips for following this plan? Reading food labels   Avoid foods with saturated or Trans fat.  Check the ingredient list of grains-based foods, such as bread and cereal, to make sure that they contain whole grains.  Check the ingredient list of sauces or soups to make sure they do not contain meat or fish.  When choosing soft drinks, check the ingredient list to make sure they do not contain high-fructose corn syrup. Shopping  Buy plenty of fresh fruits and vegetables.  Avoid buying canned or fresh fish.  Buy dairy products labeled as low-fat or nonfat.  Avoid buying premade or processed foods. These foods are often high in fat, salt (sodium), and added sugar. Cooking  Use olive oil instead of butter when cooking. Oils like olive oil, canola oil, and sunflower oil contain healthy fats. Meal planning  Learn which foods do or do not affect you. If you find out that a food tends to cause your gout symptoms to flare up, avoid eating that food. You can enjoy foods that do not cause problems. If you have any questions about a food item, talk with your dietitian or health care provider.  Limit foods high in fat, especially saturated  fat. Fat makes it harder for your body to get rid of uric acid.  Choose foods that are lower in fat and are lean sources of protein. General guidelines  Limit alcohol intake to no more than 1 drink a day for nonpregnant women and 2 drinks a day for men. One drink equals 12 oz of beer, 5 oz of wine, or 1 oz of hard liquor. Alcohol can affect the way your body gets rid of uric acid.  Drink plenty of water to keep your urine clear or pale yellow. Fluids can help remove uric acid from your body.  If directed by your health care provider, take a vitamin C supplement.  Work with your health care provider and dietitian to develop a plan to achieve or maintain a healthy weight.  Losing weight can help reduce uric acid in your blood. What foods are recommended? The items listed may not be a complete list. Talk with your dietitian about what dietary choices are best for you. Foods low in purines Foods low in purines do not need to be limited. These include:  All fruits.  All low-purine vegetables, pickles, and olives.  Breads, pasta, rice, cornbread, and popcorn. Cake and other baked goods.  All dairy foods.  Eggs, nuts, and nut butters.  Spices and condiments, such as salt, herbs, and vinegar.  Plant oils, butter, and margarine.  Water, sugar-free soft drinks, tea, coffee, and cocoa.  Vegetable-based soups, broths, sauces, and gravies. Foods moderate in purines Foods moderate in purines should be limited to the amounts listed.   cup of asparagus, cauliflower, spinach, mushrooms, or green peas, each day.  2/3 cup uncooked oatmeal, each day.   cup dry wheat bran or wheat germ, each day.  2-3 ounces of meat or poultry, each day.  4-6 ounces of shellfish, such as crab, lobster, oysters, or shrimp, each day.  1 cup cooked beans, peas, or lentils, each day.  Soup, broths, or bouillon made from meat or fish. Limit these foods as much as possible. What foods are not recommended? The items listed may not be a complete list. Talk with your dietitian about what dietary choices are best for you. Limit your intake of foods high in purines, including:  Beer and other alcohol.  Meat-based gravy or sauce.  Canned or fresh fish, such as: ? Anchovies, sardines, herring, and tuna. ? Mussels and scallops. ? Codfish, trout, and haddock.  Berniece Salines.  Organ meats, such as: ? Liver or kidney. ? Tripe. ? Sweetbreads (thymus gland or pancreas).  Wild Clinical biochemist.  Yeast or yeast extract supplements.  Drinks sweetened with high-fructose corn syrup. Summary  Eating a low-purine diet can help control conditions caused by too much uric acid in the body,  such as gout or kidney stones.  Choose low-purine foods, limit alcohol, and limit foods high in fat.  You will learn over time which foods do or do not affect you. If you find out that a food tends to cause your gout symptoms to flare up, avoid eating that food. This information is not intended to replace advice given to you by your health care provider. Make sure you discuss any questions you have with your health care provider. Document Revised: 03/30/2017 Document Reviewed: 05/31/2016 Elsevier Patient Education  2020 [employer].

## 2020-02-12 ENCOUNTER — Other Ambulatory Visit: Payer: Self-pay | Admitting: Cardiovascular Disease

## 2020-02-16 ENCOUNTER — Telehealth: Payer: Self-pay

## 2020-02-16 ENCOUNTER — Other Ambulatory Visit: Payer: Self-pay | Admitting: Family Medicine

## 2020-02-16 DIAGNOSIS — N400 Enlarged prostate without lower urinary tract symptoms: Secondary | ICD-10-CM

## 2020-02-16 MED ORDER — FLUTICASONE PROPIONATE 50 MCG/ACT NA SUSP: 1.0000 | NASAL | 5 refills | Status: DC

## 2020-02-16 MED ORDER — FLUTICASONE PROPIONATE 50 MCG/ACT NA SUSP
1.0000 | NASAL | 6 refills | Status: DC
Start: 2020-02-16 — End: 2021-04-18

## 2020-02-16 NOTE — Telephone Encounter (Signed)
Patient called in for a refill request on his medications   fluticasone (FLONASE) 50 MCG/ACT nasal spray  lisinopril (ZESTRIL) 5 MG tablet   [place], [place]

## 2020-02-16 NOTE — Telephone Encounter (Signed)
Dr. Claiborne Billings refilled the lisinopril to [facility] on 10/15. I have sent the flonase.

## 2020-02-17 NOTE — Telephone Encounter (Signed)
Noted  

## 2020-03-01 ENCOUNTER — Telehealth: Payer: Self-pay

## 2020-03-01 NOTE — Telephone Encounter (Signed)
pharmamcy called to put in medication refill request for patient     lisinopril (ZESTRIL) 5 MG tablet   [place], [place]

## 2020-03-01 NOTE — Telephone Encounter (Signed)
I left a detailed message at the pts cell number refills were sent to [facility] by Dr Claiborne Billings on 10/15 and to contact Dr Evette Georges office with any questions.

## 2020-03-08 ENCOUNTER — Other Ambulatory Visit: Payer: Self-pay

## 2020-03-08 ENCOUNTER — Other Ambulatory Visit: Payer: Medicare HMO

## 2020-03-08 DIAGNOSIS — R944 Abnormal results of kidney function studies: Secondary | ICD-10-CM

## 2020-03-08 DIAGNOSIS — E790 Hyperuricemia without signs of inflammatory arthritis and tophaceous disease: Secondary | ICD-10-CM | POA: Diagnosis not present

## 2020-03-09 LAB — COMPREHENSIVE METABOLIC PANEL
AG Ratio: 1.6 (calc) (ref 1.0–2.5)
ALT: 15 U/L (ref 9–46)
AST: 14 U/L (ref 10–35)
Albumin: 3.9 g/dL (ref 3.6–5.1)
Alkaline phosphatase (APISO): 63 U/L (ref 35–144)
BUN/Creatinine Ratio: 28 (calc) — ABNORMAL HIGH (ref 6–22)
BUN: 32 mg/dL — ABNORMAL HIGH (ref 7–25)
CO2: 27 mmol/L (ref 20–32)
Calcium: 9.2 mg/dL (ref 8.6–10.3)
Chloride: 108 mmol/L (ref 98–110)
Creat: 1.14 mg/dL — ABNORMAL HIGH (ref 0.70–1.11)
Globulin: 2.5 g/dL (calc) (ref 1.9–3.7)
Glucose, Bld: 102 mg/dL — ABNORMAL HIGH (ref 65–99)
Potassium: 5.1 mmol/L (ref 3.5–5.3)
Sodium: 142 mmol/L (ref 135–146)
Total Bilirubin: 0.6 mg/dL (ref 0.2–1.2)
Total Protein: 6.4 g/dL (ref 6.1–8.1)

## 2020-03-09 LAB — URIC ACID: Uric Acid, Serum: 7.3 mg/dL (ref 4.0–8.0)

## 2020-03-15 ENCOUNTER — Other Ambulatory Visit: Payer: Self-pay | Admitting: Cardiovascular Disease

## 2020-03-15 MED ORDER — LISINOPRIL 5 MG PO TABS
7.5000 mg | ORAL_TABLET | Freq: Every day | ORAL | 2 refills | Status: DC
Start: 2020-03-15 — End: 2020-09-16

## 2020-03-15 NOTE — Telephone Encounter (Signed)
Rx has been sent to the pharmacy electronically. ° °

## 2020-03-15 NOTE — Telephone Encounter (Signed)
*  STAT* If patient is at the pharmacy, call can be transferred to refill team.   1. Which medications need to be refilled? (please list name of each medication and dose if known) Lisinopril    2. Which pharmacy/location (including street and city if local pharmacy) is medication to be sent to? [facility]  3. Do they need a 30 day or 90 day supply? 90 days and refills

## 2020-03-22 ENCOUNTER — Other Ambulatory Visit: Payer: Self-pay | Admitting: Family Medicine

## 2020-03-22 DIAGNOSIS — N400 Enlarged prostate without lower urinary tract symptoms: Secondary | ICD-10-CM

## 2020-03-30 ENCOUNTER — Other Ambulatory Visit: Payer: Self-pay

## 2020-03-30 ENCOUNTER — Encounter: Payer: Self-pay | Admitting: Cardiovascular Disease

## 2020-03-30 ENCOUNTER — Ambulatory Visit: Payer: Medicare HMO | Admitting: Cardiovascular Disease

## 2020-03-30 VITALS — BP 152/78 | HR 59 | Ht 75.0 in | Wt 257.8 lb

## 2020-03-30 DIAGNOSIS — I10 Essential (primary) hypertension: Secondary | ICD-10-CM | POA: Diagnosis not present

## 2020-03-30 DIAGNOSIS — G4733 Obstructive sleep apnea (adult) (pediatric): Secondary | ICD-10-CM

## 2020-03-30 DIAGNOSIS — Z7901 Long term (current) use of anticoagulants: Secondary | ICD-10-CM

## 2020-03-30 DIAGNOSIS — I483 Typical atrial flutter: Secondary | ICD-10-CM

## 2020-03-30 DIAGNOSIS — E7800 Pure hypercholesterolemia, unspecified: Secondary | ICD-10-CM | POA: Diagnosis not present

## 2020-03-30 NOTE — Patient Instructions (Signed)

## 2020-03-30 NOTE — Progress Notes (Signed)
Cardiology Office Note    Date:  03/30/2020   ID:  Gary FOUT, DOB Oct 15, 1935, MRN [id number]  PCP:  Caren Macadam, MD  Cardiologist:   [name], MD   No chief complaint on file. Atrial flutter  History of Present Illness:  Gary [name] is a 84 y.o. male with asymptomatic atrial flutter with spontaneously controlled ventricular rate, recurrent following previous cardioversion without significant structural heart disease. He returns for routine follow-up.   He is still exercising with a trainer twice a week and has not felt the need to slow down from last year.  He is 100% compliant with CPAP and denies daytime hypersomnolence.  He has not had any falls, serious injuries or bleeding problems on Eliquis.  The patient specifically denies any chest pain at rest exertion, dyspnea at rest or with exertion, orthopnea, paroxysmal nocturnal dyspnea, syncope, palpitations, focal neurological deficits, intermittent claudication, lower extremity edema, unexplained weight gain, cough, hemoptysis or wheezing.  In the past, he has briefly interrupted anticoagulation for surgical procedures, without embolic events.  His systolic blood pressure was normal today, but at home it is usually in the 120s.  He virtually never sees readings higher than 140.  His blood pressure was 108/70 at his last appointment with his primary care provider.  Previous workup with echocardiography shows aortic valve sclerosis without stenosis and normal left ventricular function. He has a markedly dilated left atrium. He has obstructive sleep apnea and is compliant with CPAP.  Past Medical History:  Diagnosis Date  . Adenomatous colon polyp 06/2000  . ALLERGIC RHINITIS 10/03/2007  . Allergy    seasonal  . Arthritis    knees  . Atrial flutter ([place]) 06/26/2014  . Cancer ([place])    skin cancers removed in the past basal cell and squamous cell  . CARDIAC MURMUR, AORTIC 10/03/2007  . ERECTILE DYSFUNCTION  10/03/2007  . High blood pressure 03/2016  . HYPERLIPIDEMIA 10/03/2007  . Rosacea   . Sleep apnea     Past Surgical History:  Procedure Laterality Date  . ADENOIDECTOMY    . CARDIOVERSION N/A 07/01/2014   Procedure: CARDIOVERSION;  Surgeon: [name], MD;  Location: [facility];  Service: Cardiovascular;  Laterality: N/A;  . COLONOSCOPY    . PALATE SURGERY    . TEE WITHOUT CARDIOVERSION N/A 07/01/2014   Procedure: TRANSESOPHAGEAL ECHOCARDIOGRAM (TEE);  Surgeon: [name], MD;  Location: [facility];  Service: Cardiovascular;  Laterality: N/A;  . TONSILLECTOMY    . TOTAL KNEE ARTHROPLASTY Right 08/27/2017   Procedure: RIGHT TOTAL KNEE ARTHROPLASTY;  Surgeon: Vickey Huger, MD;  Location: [facility];  Service: Orthopedics;  Laterality: Right;    Current Medications: Outpatient Medications Prior to Visit  Medication Sig Dispense Refill  . allopurinol (ZYLOPRIM) 100 MG tablet Take 1 tablet (100 mg total) by mouth daily. 90 tablet 0  . co-enzyme Q-10 30 MG capsule Take 30 mg by mouth every other day.     [name] ELIQUIS 5 MG TABS tablet TAKE 1 TABLET TWICE DAILY 180 tablet 1  . fluticasone (FLONASE) 50 MCG/ACT nasal spray Place 1 spray into both nostrils every other day. 32 g 6  . lisinopril (ZESTRIL) 5 MG tablet Take 1.5 tablets (7.5 mg total) by mouth daily. 135 tablet 2  . minocycline (MINOCIN,DYNACIN) 100 MG capsule Take 100 mg by mouth every other day.     . Multiple Vitamin (MULTIVITAMIN) tablet Take 1 tablet by mouth daily.    . simvastatin (ZOCOR) 20 MG  tablet Take 1 tablet (20 mg total) by mouth at bedtime. (Patient taking differently: Take 20 mg by mouth every other day. ) 90 tablet 4  . tamsulosin (FLOMAX) 0.4 MG CAPS capsule TAKE 1 CAPSULE EVERY DAY 90 capsule 1   Facility-Administered Medications Prior to Visit  Medication Dose Route Frequency Provider Last Rate Last Admin  . 0.9 %  sodium chloride infusion  500 mL Intravenous Once Ladene Artist, MD         Allergies:    Patient has no known allergies.   Social History   Socioeconomic History  . Marital status: Married    Spouse name: Not on file  . Number of children: 2  . Years of education: Not on file  . Highest education level: Not on file  Occupational History  . Occupation: IT trainer company    Comment: retired  Tobacco Use  . Smoking status: Former Research scientist (life sciences)  . Smokeless tobacco: Never Used  Vaping Use  . Vaping Use: Never used  Substance and Sexual Activity  . Alcohol use: Yes    Alcohol/week: 8.0 standard drinks    Types: 8 Glasses of wine per week    Comment: ocassionally   . Drug use: No  . Sexual activity: Not on file  Other Topics Concern  . Not on file  Social History Narrative   Lives with wife in two story house, but occupies main level mostly   Has 4 children, two biological, two stepchildren, 9 grandkids   One son local, supportive    Active playing golf with friends, goes to Physiological scientist 2 days/week at [employer]   Enjoys reading, soduku   Social Determinants of Health   Financial Resource Strain: Low Risk   . Difficulty of Paying Living Expenses: Not hard at all  Food Insecurity:   . Worried About Charity fundraiser in the Last Year: Not on file  . Ran Out of Food in the Last Year: Not on file  Transportation Needs: No Transportation Needs  . Lack of Transportation (Medical): No  . Lack of Transportation (Non-Medical): No  Physical Activity:   . Days of Exercise per Week: Not on file  . Minutes of Exercise per Session: Not on file  Stress:   . Feeling of Stress : Not on file  Social Connections:   . Frequency of Communication with Friends and Family: Not on file  . Frequency of Social Gatherings with Friends and Family: Not on file  . Attends Religious Services: Not on file  . Active Member of Clubs or Organizations: Not on file  . Attends Archivist Meetings: Not on file  . Marital Status: Not on file     Family History:  The  patient's family history includes CAD in his brother; Cancer in his father; Diabetes in his mother; Hypotension in his sister; Liver cancer in his father; Stomach cancer in his paternal aunt.   ROS:   Please see the history of present illness.    ROS All other systems reviewed and are negative.   PHYSICAL EXAM:   VS:  BP (!) 152/78   Pulse (!) 59   Ht 6\' 3"  (1.905 m)   Wt 257 lb 12.8 oz (116.9 kg)   SpO2 94%   BMI 32.22 kg/m       General: Alert, oriented x3, no distress, rosacea Head: no evidence of trauma, PERRL, EOMI, no exophtalmos or lid lag, no myxedema, no xanthelasma; normal ears, nose  and oropharynx Neck: normal jugular venous pulsations and no hepatojugular reflux; brisk carotid pulses without delay and no carotid bruits Chest: clear to auscultation, no signs of consolidation by percussion or palpation, normal fremitus, symmetrical and full respiratory excursions Cardiovascular: normal position and quality of the apical impulse, regular rhythm, normal first and second heart sounds, no murmurs, rubs or gallops Abdomen: no tenderness or distention, no masses by palpation, no abnormal pulsatility or arterial bruits, normal bowel sounds, no hepatosplenomegaly Extremities: no clubbing, cyanosis or edema; 2+ radial, ulnar and brachial pulses bilaterally; 2+ right femoral, posterior tibial and dorsalis pedis pulses; 2+ left femoral, posterior tibial and dorsalis pedis pulses; no subclavian or femoral bruits Neurological: grossly nonfocal Psych: Normal mood and affect   Wt Readings from Last 3 Encounters:  03/30/20 257 lb 12.8 oz (116.9 kg)  01/26/20 259 lb 1.6 oz (117.5 kg)  09/17/19 244 lb (110.7 kg)      Studies/Labs Reviewed:   EKG:  EKG is ordered today.  It shows typical counterclockwise atrial flutter with variable AV block, mostly 4: 1, ventricular rate 60 bpm, otherwise normal tracing. Recent Labs: 01/26/2020: Hemoglobin 14.0; Platelets 134 03/08/2020: ALT 15; BUN  32; Creat 1.14; Potassium 5.1; Sodium 142   Lipid Panel     Component Value Date/Time   CHOL 141 01/26/2020 0913   TRIG 98 01/26/2020 0913   HDL 39 (L) 01/26/2020 0913   CHOLHDL 3.6 01/26/2020 0913   VLDL 19.2 07/14/2019 0925   LDLCALC 83 01/26/2020 0913   LDLDIRECT 143.5 01/22/2012 0916    01/10/2019 Total cholesterol 166, HDL 37, LDL 103, triglycerides 128 Hemoglobin A1c 6.1%, hemoglobin 15.1, creatinine 1.16, potassium 4.4, normal liver function test  ASSESSMENT:    1. Typical atrial flutter ([place])   2. Long term current use of anticoagulant   3. OSA (obstructive sleep apnea)   4. Hypercholesterolemia   5. Essential hypertension      PLAN:  In order of problems listed above:  1. AFlutter: Asymptomatic and spontaneously rate controlled.  No symptoms of bradycardia to suggest he needs pacemaker implantation at this time. CHADSVasc 3 (age, HTN).  Appropriately anticoagulated 2. Eliquis: Well-tolerated without bleeding complications. 3. OSA: Reports compliance with treatment. 4. HLP: LDL at target.  He does not have known CAD HDL is borderline low.  This might improve with weight loss. 5. HTN: Mildly elevated systolic blood pressure today, but consistently well controlled at home and actually was 108/70 when he last saw his primary care provider.  No changes made to his medications.   Medication Adjustments/Labs and Tests Ordered: Current medicines are reviewed at length with the patient today.  Concerns regarding medicines are outlined above.  Medication changes, Labs and Tests ordered today are listed in the Patient Instructions below. Patient Instructions  Medication Instructions:  No changes *If you need a refill on your cardiac medications before your next appointment, please call your pharmacy*   Lab Work: None ordered If you have labs (blood work) drawn today and your tests are completely normal, you will receive your results only by: [name] MyChart Message (if you  have MyChart) OR . A paper copy in the mail If you have any lab test that is abnormal or we need to change your treatment, we will call you to review the results.   Testing/Procedures: None ordered   Follow-Up: At [facility], you and your health needs are our priority.  As part of our continuing mission to provide you with exceptional heart care, we  have created designated Provider Care Teams.  These Care Teams include your primary Cardiologist (physician) and Advanced Practice Providers (APPs -  Physician Assistants and Nurse Practitioners) who all work together to provide you with the care you need, when you need it.  We recommend signing up for the patient portal called "MyChart".  Sign up information is provided on this After Visit Summary.  MyChart is used to connect with patients for Virtual Visits (Telemedicine).  Patients are able to view lab/test results, encounter notes, upcoming appointments, etc.  Non-urgent messages can be sent to your provider as well.   To learn more about what you can do with MyChart, go to [email or web address].    Your next appointment:   12 month(s)  The format for your next appointment:   In Person  Provider:   You may see [name], MD or one of the following Advanced Practice Providers on your designated Care Team:    [name], PA-C  Fabian Sharp, [place] or   Roby Lofts, PA-C      Signed, [name], MD  03/30/2020 9:36 AM    [facility] [address], [place], [place]  [address] Phone: [phone]; Fax: [phone]        The MRI is scheduled at [facility] 11/18 at [place].

## 2020-04-08 DIAGNOSIS — H34832 Tributary (branch) retinal vein occlusion, left eye, with macular edema: Secondary | ICD-10-CM | POA: Diagnosis not present

## 2020-04-08 DIAGNOSIS — H4323 Crystalline deposits in vitreous body, bilateral: Secondary | ICD-10-CM | POA: Diagnosis not present

## 2020-04-08 DIAGNOSIS — H43813 Vitreous degeneration, bilateral: Secondary | ICD-10-CM | POA: Diagnosis not present

## 2020-04-08 DIAGNOSIS — H35361 Drusen (degenerative) of macula, right eye: Secondary | ICD-10-CM | POA: Diagnosis not present

## 2020-04-20 DIAGNOSIS — L821 Other seborrheic keratosis: Secondary | ICD-10-CM | POA: Diagnosis not present

## 2020-04-20 DIAGNOSIS — C44729 Squamous cell carcinoma of skin of left lower limb, including hip: Secondary | ICD-10-CM | POA: Diagnosis not present

## 2020-04-20 DIAGNOSIS — L820 Inflamed seborrheic keratosis: Secondary | ICD-10-CM | POA: Diagnosis not present

## 2020-04-20 DIAGNOSIS — D485 Neoplasm of uncertain behavior of skin: Secondary | ICD-10-CM | POA: Diagnosis not present

## 2020-04-20 DIAGNOSIS — Z85828 Personal history of other malignant neoplasm of skin: Secondary | ICD-10-CM | POA: Diagnosis not present

## 2020-04-20 DIAGNOSIS — L570 Actinic keratosis: Secondary | ICD-10-CM | POA: Diagnosis not present

## 2020-05-05 ENCOUNTER — Other Ambulatory Visit: Payer: Self-pay | Admitting: Family Medicine

## 2020-05-28 ENCOUNTER — Other Ambulatory Visit: Payer: Self-pay | Admitting: Cardiovascular Disease

## 2020-05-31 NOTE — Telephone Encounter (Signed)
Prescription refill request for Eliquis received. Indication:atrial flutter Last office visit:11/21 [place] Scr:1.14 11/21 Age: 85 Weight:116.9 kg  Prescription refilled

## 2020-06-02 ENCOUNTER — Telehealth: Payer: Self-pay | Admitting: Cardiovascular Disease

## 2020-06-02 NOTE — Telephone Encounter (Signed)
    Patient calling the office for samples of medication:   1.  What medication and dosage are you requesting samples for?Eliquis  2.  Are you currently out of this medication? No  Pt said Dr. Loletha Grayer will give him samples of eliquis and he is following it up

## 2020-06-02 NOTE — Telephone Encounter (Signed)
Spoke with patient to inform him 2 weeks of Eliquis will be placed at front desk for pickup.  Lot: [id number] Exp: 4/24

## 2020-06-04 ENCOUNTER — Ambulatory Visit (INDEPENDENT_AMBULATORY_CARE_PROVIDER_SITE_OTHER): Payer: Medicare HMO

## 2020-06-04 DIAGNOSIS — Z0000 Encounter for general adult medical examination without abnormal findings: Secondary | ICD-10-CM | POA: Diagnosis not present

## 2020-06-04 NOTE — Patient Instructions (Signed)
Gary [name] , Thank you for taking time to come for your Medicare Wellness Visit. I appreciate your ongoing commitment to your health goals. Please review the following plan we discussed and let me know if I can assist you in the future.   Screening recommendations/referrals: Colonoscopy: No longer required  Recommended yearly ophthalmology/optometry visit for glaucoma screening and checkup Recommended yearly dental visit for hygiene and checkup  Vaccinations: Influenza vaccine: up to date, next due fall 2022 Pneumococcal vaccine: Completed series  Tdap vaccine: up to date, next due 05/13/2026  Shingles vaccine: Completed series     Advanced directives: Copies on file   Conditions/risks identified: None   Next appointment: 07/26/2020 @ 9:00 am with Dr. Ethlyn Gallery    Preventive Care 65 Years and Older, Male Preventive care refers to lifestyle choices and visits with your health care provider that can promote health and wellness. What does preventive care include?  A yearly physical exam. This is also called an annual well check.  Dental exams once or twice a year.  Routine eye exams. Ask your health care provider how often you should have your eyes checked.  Personal lifestyle choices, including:  Daily care of your teeth and gums.  Regular physical activity.  Eating a healthy diet.  Avoiding tobacco and drug use.  Limiting alcohol use.  Practicing safe sex.  Taking low doses of aspirin every day.  Taking vitamin and mineral supplements as recommended by your health care provider. What happens during an annual well check? The services and screenings done by your health care provider during your annual well check will depend on your age, overall health, lifestyle risk factors, and family history of disease. Counseling  Your health care provider may ask you questions about your:  Alcohol use.  Tobacco use.  Drug use.  Emotional well-being.  Home and  relationship well-being.  Sexual activity.  Eating habits.  History of falls.  Memory and ability to understand (cognition).  Work and work Statistician. Screening  You may have the following tests or measurements:  Height, weight, and BMI.  Blood pressure.  Lipid and cholesterol levels. These may be checked every 5 years, or more frequently if you are over 73 years old.  Skin check.  Lung cancer screening. You may have this screening every year starting at age 69 if you have a 30-pack-year history of smoking and currently smoke or have quit within the past 15 years.  Fecal occult blood test (FOBT) of the stool. You may have this test every year starting at age 104.  Flexible sigmoidoscopy or colonoscopy. You may have a sigmoidoscopy every 5 years or a colonoscopy every 10 years starting at age 56.  Prostate cancer screening. Recommendations will vary depending on your family history and other risks.  Hepatitis C blood test.  Hepatitis B blood test.  Sexually transmitted disease (STD) testing.  Diabetes screening. This is done by checking your blood sugar (glucose) after you have not eaten for a while (fasting). You may have this done every 1-3 years.  Abdominal aortic aneurysm (AAA) screening. You may need this if you are a current or former smoker.  Osteoporosis. You may be screened starting at age 55 if you are at high risk. Talk with your health care provider about your test results, treatment options, and if necessary, the need for more tests. Vaccines  Your health care provider may recommend certain vaccines, such as:  Influenza vaccine. This is recommended every year.  Tetanus, diphtheria, and acellular  pertussis (Tdap, Td) vaccine. You may need a Td booster every 10 years.  Zoster vaccine. You may need this after age 18.  Pneumococcal 13-valent conjugate (PCV13) vaccine. One dose is recommended after age 31.  Pneumococcal polysaccharide (PPSV23) vaccine. One  dose is recommended after age 58. Talk to your health care provider about which screenings and vaccines you need and how often you need them. This information is not intended to replace advice given to you by your health care provider. Make sure you discuss any questions you have with your health care provider. Document Released: 05/14/2015 Document Revised: 01/05/2016 Document Reviewed: 02/16/2015 Elsevier Interactive Patient Education  2017 [place] Prevention in the Home Falls can cause injuries. They can happen to people of all ages. There are many things you can do to make your home safe and to help prevent falls. What can I do on the outside of my home?  Regularly fix the edges of walkways and driveways and fix any cracks.  Remove anything that might make you trip as you walk through a door, such as a raised step or threshold.  Trim any bushes or trees on the path to your home.  Use bright outdoor lighting.  Clear any walking paths of anything that might make someone trip, such as rocks or tools.  Regularly check to see if handrails are loose or broken. Make sure that both sides of any steps have handrails.  Any raised decks and porches should have guardrails on the edges.  Have any leaves, snow, or ice cleared regularly.  Use sand or salt on walking paths during winter.  Clean up any spills in your garage right away. This includes oil or grease spills. What can I do in the bathroom?  Use night lights.  Install grab bars by the toilet and in the tub and shower. Do not use towel bars as grab bars.  Use non-skid mats or decals in the tub or shower.  If you need to sit down in the shower, use a plastic, non-slip stool.  Keep the floor dry. Clean up any water that spills on the floor as soon as it happens.  Remove soap buildup in the tub or shower regularly.  Attach bath mats securely with double-sided non-slip rug tape.  Do not have throw rugs and other  things on the floor that can make you trip. What can I do in the bedroom?  Use night lights.  Make sure that you have a light by your bed that is easy to reach.  Do not use any sheets or blankets that are too big for your bed. They should not hang down onto the floor.  Have a firm chair that has side arms. You can use this for support while you get dressed.  Do not have throw rugs and other things on the floor that can make you trip. What can I do in the kitchen?  Clean up any spills right away.  Avoid walking on wet floors.  Keep items that you use a lot in easy-to-reach places.  If you need to reach something above you, use a strong step stool that has a grab bar.  Keep electrical cords out of the way.  Do not use floor polish or wax that makes floors slippery. If you must use wax, use non-skid floor wax.  Do not have throw rugs and other things on the floor that can make you trip. What can I do with my stairs?  Do not leave any items on the stairs.  Make sure that there are handrails on both sides of the stairs and use them. Fix handrails that are broken or loose. Make sure that handrails are as long as the stairways.  Check any carpeting to make sure that it is firmly attached to the stairs. Fix any carpet that is loose or worn.  Avoid having throw rugs at the top or bottom of the stairs. If you do have throw rugs, attach them to the floor with carpet tape.  Make sure that you have a light switch at the top of the stairs and the bottom of the stairs. If you do not have them, ask someone to add them for you. What else can I do to help prevent falls?  Wear shoes that:  Do not have high heels.  Have rubber bottoms.  Are comfortable and fit you well.  Are closed at the toe. Do not wear sandals.  If you use a stepladder:  Make sure that it is fully opened. Do not climb a closed stepladder.  Make sure that both sides of the stepladder are locked into place.  Ask  someone to hold it for you, if possible.  Clearly mark and make sure that you can see:  Any grab bars or handrails.  First and last steps.  Where the edge of each step is.  Use tools that help you move around (mobility aids) if they are needed. These include:  Canes.  Walkers.  Scooters.  Crutches.  Turn on the lights when you go into a dark area. Replace any light bulbs as soon as they burn out.  Set up your furniture so you have a clear path. Avoid moving your furniture around.  If any of your floors are uneven, fix them.  If there are any pets around you, be aware of where they are.  Review your medicines with your doctor. Some medicines can make you feel dizzy. This can increase your chance of falling. Ask your doctor what other things that you can do to help prevent falls. This information is not intended to replace advice given to you by your health care provider. Make sure you discuss any questions you have with your health care provider. Document Released: 02/11/2009 Document Revised: 09/23/2015 Document Reviewed: 05/22/2014 Elsevier Interactive Patient Education  2017 [employer].

## 2020-06-04 NOTE — Progress Notes (Signed)
Subjective:   Gary [name] is a 85 y.o. male who presents for Medicare Annual/Subsequent preventive examination.  I connected with Gary [name] today by telephone and verified that I am speaking with the correct person using two identifiers. Location patient: home Location provider: work Persons participating in the virtual visit: patient, provider.   I discussed the limitations, risks, security and privacy concerns of performing an evaluation and management service by telephone and the availability of in person appointments. I also discussed with the patient that there may be a patient responsible charge related to this service. The patient expressed understanding and verbally consented to this telephonic visit.    Interactive audio and video telecommunications were attempted between this provider and patient, however failed, due to patient having technical difficulties OR patient did not have access to video capability.  We continued and completed visit with audio only.      Review of Systems    N/A  Cardiac Risk Factors include: advanced age (>50men, >41 women);male gender     Objective:    Today's Vitals   There is no height or weight on file to calculate BMI.  Advanced Directives 06/04/2020 11/26/2019 07/10/2018 08/27/2017 08/16/2017 08/16/2017 10/08/2014  Does Patient Have a Medical Advance Directive? Yes Yes Yes Yes Yes No No  Type of Advance Directive Living will;Healthcare Power of [place];Living will [place];Living will [place];Living will - -  Does patient want to make changes to medical advance directive? - - No - Patient declined No - Patient declined No - Patient declined - -  Copy of [place] in Chart? Yes - validated most recent copy scanned in chart (See row information) Yes - validated most recent copy scanned in chart (See row information) No - copy  requested No - copy requested Yes - -  Would patient like information on creating a medical advance directive? - - - No - Patient declined - No - Patient declined No - patient declined information    Current Medications (verified) Outpatient Encounter Medications as of 06/04/2020  Medication Sig  . allopurinol (ZYLOPRIM) 100 MG tablet TAKE 1 TABLET(100 MG) BY MOUTH DAILY  . co-enzyme Q-10 30 MG capsule Take 30 mg by mouth every other day.   [name] ELIQUIS 5 MG TABS tablet TAKE 1 TABLET TWICE DAILY  . fluticasone (FLONASE) 50 MCG/ACT nasal spray Place 1 spray into both nostrils every other day.  . lisinopril (ZESTRIL) 5 MG tablet Take 1.5 tablets (7.5 mg total) by mouth daily.  . minocycline (MINOCIN,DYNACIN) 100 MG capsule Take 100 mg by mouth every other day.   . Multiple Vitamin (MULTIVITAMIN) tablet Take 1 tablet by mouth daily.  . simvastatin (ZOCOR) 20 MG tablet Take 1 tablet (20 mg total) by mouth at bedtime. (Patient taking differently: Take 20 mg by mouth every other day.)  . tamsulosin (FLOMAX) 0.4 MG CAPS capsule TAKE 1 CAPSULE EVERY DAY   Facility-Administered Encounter Medications as of 06/04/2020  Medication  . 0.9 %  sodium chloride infusion    Allergies (verified) Patient has no known allergies.   History: Past Medical History:  Diagnosis Date  . Adenomatous colon polyp 06/2000  . ALLERGIC RHINITIS 10/03/2007  . Allergy    seasonal  . Arthritis    knees  . Atrial flutter ([place]) 06/26/2014  . Cancer ([place])    skin cancers removed in the past basal cell and squamous cell  .  CARDIAC MURMUR, AORTIC 10/03/2007  . ERECTILE DYSFUNCTION 10/03/2007  . High blood pressure 03/2016  . HYPERLIPIDEMIA 10/03/2007  . Rosacea   . Sleep apnea    Past Surgical History:  Procedure Laterality Date  . ADENOIDECTOMY    . CARDIOVERSION N/A 07/01/2014   Procedure: CARDIOVERSION;  Surgeon: [name], MD;  Location: [facility] END[facility]COPY;  Service: Cardiovascular;  Laterality: N/A;  . COLONOSCOPY    .  PALATE SURGERY    . TEE WITHOUT CARDIOVERSION N/A 07/01/2014   Procedure: TRANSESOPHAGEAL ECHOCARDIOGRAM (TEE);  Surgeon: [name], MD;  Location: [facility];  Service: Cardiovascular;  Laterality: N/A;  . TONSILLECTOMY    . TOTAL KNEE ARTHROPLASTY Right 08/27/2017   Procedure: RIGHT TOTAL KNEE ARTHROPLASTY;  Surgeon: Vickey Huger, MD;  Location: [facility];  Service: Orthopedics;  Laterality: Right;   Family History  Problem Relation Age of Onset  . Diabetes Mother   . Cancer Father        liver ca  . Liver cancer Father   . Hypotension Sister   . CAD Brother   . Stomach cancer Paternal Aunt    Social History   Socioeconomic History  . Marital status: Married    Spouse name: Not on file  . Number of children: 2  . Years of education: Not on file  . Highest education level: Not on file  Occupational History  . Occupation: IT trainer company    Comment: retired  Tobacco Use  . Smoking status: Former Research scientist (life sciences)  . Smokeless tobacco: Never Used  Vaping Use  . Vaping Use: Never used  Substance and Sexual Activity  . Alcohol use: Yes    Alcohol/week: 8.0 standard drinks    Types: 8 Glasses of wine per week    Comment: ocassionally   . Drug use: No  . Sexual activity: Not on file  Other Topics Concern  . Not on file  Social History Narrative   Lives with wife in two story house, but occupies main level mostly   Has 4 children, two biological, two stepchildren, 9 grandkids   One son local, supportive    Active playing golf with friends, goes to Physiological scientist 2 days/week at [employer]   Enjoys reading, soduku   Social Determinants of Health   Financial Resource Strain: Low Risk   . Difficulty of Paying Living Expenses: Not hard at all  Food Insecurity: No Food Insecurity  . Worried About Charity fundraiser in the Last Year: Never true  . Ran Out of Food in the Last Year: Never true  Transportation Needs: No Transportation Needs  . Lack of  Transportation (Medical): No  . Lack of Transportation (Non-Medical): No  Physical Activity: Insufficiently Active  . Days of Exercise per Week: 2 days  . Minutes of Exercise per Session: 50 min  Stress: No Stress Concern Present  . Feeling of Stress : Not at all  Social Connections: Socially Integrated  . Frequency of Communication with Friends and Family: More than three times a week  . Frequency of Social Gatherings with Friends and Family: More than three times a week  . Attends Religious Services: More than 4 times per year  . Active Member of Clubs or Organizations: Yes  . Attends Archivist Meetings: Never  . Marital Status: Married    Tobacco Counseling Counseling given: Not Answered   Clinical Intake:  Pre-visit preparation completed: Yes  Pain : No/denies pain     Nutritional Risks: None Diabetes:  No  How often do you need to have someone help you when you read instructions, pamphlets, or other written materials from your doctor or pharmacy?: 1 - Never  Diabetic?No  Interpreter Needed?: No  Information entered by :: [place] of Daily Living In your present state of health, do you have any difficulty performing the following activities: 06/04/2020  Hearing? N  Vision? N  Difficulty concentrating or making decisions? N  Walking or climbing stairs? N  Dressing or bathing? N  Doing errands, shopping? N  Preparing Food and eating ? N  Using the Toilet? N  In the past six months, have you accidently leaked urine? N  Do you have problems with loss of bowel control? N  Managing your Medications? N  Managing your Finances? N  Housekeeping or managing your Housekeeping? N  Some recent data might be hidden    Patient Care Team: Caren Macadam, MD as PCP - General (Family Medicine) Croitoru, Dani Gobble, MD as PCP - Cardiology (Cardiology) Jarome Matin, MD as Consulting Physician (Dermatology) Troy Sine, MD as Consulting Physician  (Cardiology) Desma Maxim, MD as Referring Physician (Ophthalmology) Viona Gilmore, [facility] as Pharmacist (Pharmacist)  Indicate any recent Medical Services you may have received from other than [facility] providers in the past year (date may be approximate).     Assessment:   This is a routine wellness examination for Kunio.  Hearing/Vision screen  Hearing Screening   125Hz  250Hz  500Hz  1000Hz  2000Hz  3000Hz  4000Hz  6000Hz  8000Hz   Right ear:           Left ear:           Vision Screening Comments: Patient stated gets eyes examined every 4 months. Has cataracts bilaterally. Also has leaking blood vessel  Dietary issues and exercise activities discussed: Current Exercise Habits: Structured exercise class, Type of exercise: strength training/weights;walking, Time (Minutes): 45, Frequency (Times/Week): 2, Weekly Exercise (Minutes/Week): 90, Intensity: Mild  Goals    . Patient Stated     Continue to lose weight, with goal of 225lb-235lb by next year.    [name] Pharmacy Care Plan     CARE PLAN ENTRY (see longitudinal plan of care for additional care plan information)  Current Barriers:  . Chronic Disease Management support, education, and care coordination needs related to Hypertension, Hyperlipidemia, Atrial Fibrillation, BPH, and elevated uric acid   Hypertension BP Readings from Last 3 Encounters:  01/26/20 108/70  09/17/19 140/68  07/14/19 120/70   . Pharmacist Clinical Goal(s): o Over the next 180 days, patient will work with PharmD and providers to maintain BP goal <130/80 . Current regimen:  o Lisinopril 5 mg 1.5 tablets daily . Interventions: o We discussed limiting added salt and switching to a salt substitute if wanting the flavor . Patient self care activities - Over the next 180 days, patient will: o Check blood pressure weekly, document, and provide at future appointments o Ensure daily salt intake < 2300 mg/day  Hyperlipidemia Lab Results  Component Value Date/Time    LDLCALC 83 01/26/2020 09:13 AM   LDLDIRECT 143.5 01/22/2012 09:16 AM   . Pharmacist Clinical Goal(s): o Over the next 180 days, patient will work with PharmD and providers to maintain LDL goal < 100 . Current regimen:  o Simvastatin 20 mg 1 tablet every other day . Interventions: o We discussed foods than can increase the good cholesterol such as olive oil, beans and legumes, high fiber fruit, whole grains . Patient self care activities -  Over the next 180 days, patient will: o Continue current medications  Elevated uric acid . Pharmacist Clinical Goal(s): o Over the next 180 days, patient will work with PharmD and providers to achieve uric acid < 6 . Current regimen:  o Allopurinol 100 mg 1 tablet daily . Interventions: o We discussed limiting alcohol intake and other foods than can increase uric acid such as shellfish, organ meats, and soda . Patient self care activities - Over the next 180 days, patient will: o Continue current medications o Return for lab work follow up in November to recheck uric acid level  A flutter . Pharmacist Clinical Goal(s) o Over the next 180 days, patient will work with PharmD and providers to maintain heart rate between 60-100 beats per minute . Current regimen:  o Eliquis 5 mg 1 tablet twice daily . Interventions: o We discussed monitoring for bleeding in urine or stool, nosebleeds, and unexpected bruising . Patient self care activities - Over the next 180 days, patient will: o Continue current medications o Continue monitoring heart rate along with blood pressure at least weekly  BPH . Pharmacist Clinical Goal(s) o Over the next 180 days, patient will work with PharmD and providers to manage symptoms of BPH . Current regimen:  o Tamsulosin 0.4 mg 1 capsule daily . Patient self care activities - Over the next 180 days, patient will: o Continue current medications  Rosacea . Pharmacist Clinical Goal(s) o Over the next 180 days, patient will  work with PharmD and providers to manage symptoms of rosacea . Current regimen:  o Minocycline 100 mg 1 capsule every other day . Patient self care activities - Over the next 180 days, patient will: o Continue current medications  Medication management . Pharmacist Clinical Goal(s): o Over the next 180 days, patient will work with PharmD and providers to maintain optimal medication adherence . Current pharmacy: [facility] . Interventions o Comprehensive medication review performed. o Continue current medication management strategy . Patient self care activities - Over the next 180 days, patient will: o Take medications as prescribed o Report any questions or concerns to PharmD and/or provider(s)  Initial goal documentation       Depression Screen PHQ 2/9 Scores 06/04/2020 07/14/2019 07/10/2018 06/14/2016 05/31/2015 05/12/2014 04/15/2013  PHQ - 2 Score 0 0 0 0 0 0 0  PHQ- 9 Score 0 - 0 - - - -    Fall Risk Fall Risk  06/04/2020 07/14/2019 07/10/2018 06/14/2016 05/31/2015  Falls in the past year? 0 0 0 No No  Number falls in past yr: 0 0 - - -  Injury with Fall? 0 0 - - -  Risk for fall due to : No Fall Risks - Impaired vision - -  Follow up Falls evaluation completed;Falls prevention discussed - - - -    FALL RISK PREVENTION PERTAINING TO THE HOME:  Any stairs in or around the home? Yes  If so, are there any without handrails? No  Home free of loose throw rugs in walkways, pet beds, electrical cords, etc? Yes  Adequate lighting in your home to reduce risk of falls? Yes   ASSISTIVE DEVICES UTILIZED TO PREVENT FALLS:  Life alert? No  Use of a cane, walker or w/c? No  Grab bars in the bathroom? No  Shower chair or bench in shower? No  Elevated toilet seat or a handicapped toilet? No    Cognitive Function:   Normal cognitive status assessed by direct observation by this  Nurse Health Advisor. No abnormalities found.        Immunizations Immunization History   Administered Date(s) Administered  . Fluad Quad(high Dose 65+) 01/17/2019, 01/26/2020  . Influenza Split 01/22/2012  . Influenza, High Dose Seasonal PF 03/13/2014, 05/31/2015, 03/14/2016, 03/18/2018  . Influenza,inj,Quad PF,6+ Mos 04/15/2013  . PFIZER(Purple Top)SARS-COV-2 Vaccination 06/02/2019, 06/29/2019  . Pneumococcal Conjugate-13 04/15/2013, 05/12/2014  . Pneumococcal Polysaccharide-23 08/19/2004, 10/19/2009  . Td 08/19/2004  . Zoster Recombinat (Shingrix) 10/26/2019, 01/12/2020    TDAP status: Due, Education has been provided regarding the importance of this vaccine. Advised may receive this vaccine at local pharmacy or Health Dept. Aware to provide a copy of the vaccination record if obtained from local pharmacy or Health Dept. Verbalized acceptance and understanding.  Flu Vaccine status: Up to date  Pneumococcal vaccine status: Up to date  Covid-19 vaccine status: Completed vaccines  Qualifies for Shingles Vaccine? Yes   Zostavax completed No   Shingrix Completed?: Yes  Screening Tests Health Maintenance  Topic Date Due  . COVID-19 Vaccine (3 - Booster for Pfizer series) 12/27/2019  . OPHTHALMOLOGY EXAM  06/01/2020  . HEMOGLOBIN A1C  07/25/2020  . COLONOSCOPY (Pts 45-2yrs Insurance coverage will need to be confirmed)  01/31/2023  . TETANUS/TDAP  05/31/2025  . INFLUENZA VACCINE  Completed  . PNA vac Low Risk Adult  Completed  . FOOT EXAM  Discontinued    Health Maintenance  Health Maintenance Due  Topic Date Due  . COVID-19 Vaccine (3 - Booster for Pfizer series) 12/27/2019  . OPHTHALMOLOGY EXAM  06/01/2020    Colorectal cancer screening: No longer required.   Lung Cancer Screening: (Low Dose CT Chest recommended if Age 41-80 years, 30 pack-year currently smoking OR have quit w/in 15years.) does not qualify.   Lung Cancer Screening Referral: N/A   Additional Screening:  Hepatitis C Screening: does not qualify;   Vision Screening: Recommended annual  ophthalmology exams for early detection of glaucoma and other disorders of the eye. Is the patient up to date with their annual eye exam?  Yes  Who is the provider or what is the name of the office in which the patient attends annual eye exams? Dr. Yolanda Bonine  If pt is not established with a provider, would they like to be referred to a provider to establish care? No .   Dental Screening: Recommended annual dental exams for proper oral hygiene  Community Resource Referral / Chronic Care Management: CRR required this visit?  No   CCM required this visit?  No      Plan:     I have personally reviewed and noted the following in the patient's chart:   . Medical and social history . Use of alcohol, tobacco or illicit drugs  . Current medications and supplements . Functional ability and status . Nutritional status . Physical activity . Advanced directives . List of other physicians . Hospitalizations, surgeries, and ER visits in previous 12 months . Vitals . Screenings to include cognitive, depression, and falls . Referrals and appointments  In addition, I have reviewed and discussed with patient certain preventive protocols, quality metrics, and best practice recommendations. A written personalized care plan for preventive services as well as general preventive health recommendations were provided to patient.     [name], LPN   [id number]   Nurse Notes: None

## 2020-06-14 DIAGNOSIS — H2511 Age-related nuclear cataract, right eye: Secondary | ICD-10-CM | POA: Diagnosis not present

## 2020-06-14 DIAGNOSIS — H348322 Tributary (branch) retinal vein occlusion, left eye, stable: Secondary | ICD-10-CM | POA: Diagnosis not present

## 2020-06-14 DIAGNOSIS — H2513 Age-related nuclear cataract, bilateral: Secondary | ICD-10-CM | POA: Diagnosis not present

## 2020-06-14 DIAGNOSIS — H18593 Other hereditary corneal dystrophies, bilateral: Secondary | ICD-10-CM | POA: Diagnosis not present

## 2020-06-14 DIAGNOSIS — H25043 Posterior subcapsular polar age-related cataract, bilateral: Secondary | ICD-10-CM | POA: Diagnosis not present

## 2020-07-13 DIAGNOSIS — H2511 Age-related nuclear cataract, right eye: Secondary | ICD-10-CM | POA: Diagnosis not present

## 2020-07-13 DIAGNOSIS — H2512 Age-related nuclear cataract, left eye: Secondary | ICD-10-CM | POA: Diagnosis not present

## 2020-07-20 DIAGNOSIS — H2512 Age-related nuclear cataract, left eye: Secondary | ICD-10-CM | POA: Diagnosis not present

## 2020-07-23 ENCOUNTER — Other Ambulatory Visit: Payer: Self-pay

## 2020-07-26 ENCOUNTER — Other Ambulatory Visit: Payer: Self-pay

## 2020-07-26 ENCOUNTER — Ambulatory Visit (INDEPENDENT_AMBULATORY_CARE_PROVIDER_SITE_OTHER): Payer: Medicare HMO | Admitting: Family Medicine

## 2020-07-26 ENCOUNTER — Encounter: Payer: Self-pay | Admitting: Family Medicine

## 2020-07-26 VITALS — BP 102/68 | HR 111 | Temp 98.0°F | Ht 75.0 in | Wt 246.1 lb

## 2020-07-26 DIAGNOSIS — R7301 Impaired fasting glucose: Secondary | ICD-10-CM

## 2020-07-26 DIAGNOSIS — E785 Hyperlipidemia, unspecified: Secondary | ICD-10-CM | POA: Diagnosis not present

## 2020-07-26 DIAGNOSIS — M1712 Unilateral primary osteoarthritis, left knee: Secondary | ICD-10-CM | POA: Diagnosis not present

## 2020-07-26 DIAGNOSIS — I483 Typical atrial flutter: Secondary | ICD-10-CM

## 2020-07-26 DIAGNOSIS — L719 Rosacea, unspecified: Secondary | ICD-10-CM

## 2020-07-26 DIAGNOSIS — G4733 Obstructive sleep apnea (adult) (pediatric): Secondary | ICD-10-CM | POA: Diagnosis not present

## 2020-07-26 DIAGNOSIS — R351 Nocturia: Secondary | ICD-10-CM

## 2020-07-26 DIAGNOSIS — M25562 Pain in left knee: Secondary | ICD-10-CM

## 2020-07-26 DIAGNOSIS — I10 Essential (primary) hypertension: Secondary | ICD-10-CM | POA: Diagnosis not present

## 2020-07-26 DIAGNOSIS — N401 Enlarged prostate with lower urinary tract symptoms: Secondary | ICD-10-CM | POA: Diagnosis not present

## 2020-07-26 DIAGNOSIS — E7800 Pure hypercholesterolemia, unspecified: Secondary | ICD-10-CM

## 2020-07-26 LAB — CBC WITH DIFFERENTIAL/PLATELET
Basophils Absolute: 0 10*3/uL (ref 0.0–0.1)
Basophils Relative: 0.4 % (ref 0.0–3.0)
Eosinophils Absolute: 0.1 10*3/uL (ref 0.0–0.7)
Eosinophils Relative: 0.8 % (ref 0.0–5.0)
HCT: 46.2 % (ref 39.0–52.0)
Hemoglobin: 15.6 g/dL (ref 13.0–17.0)
Lymphocytes Relative: 15.5 % (ref 12.0–46.0)
Lymphs Abs: 1.1 10*3/uL (ref 0.7–4.0)
MCHC: 33.8 g/dL (ref 30.0–36.0)
MCV: 98.5 fl (ref 78.0–100.0)
Monocytes Absolute: 0.8 10*3/uL (ref 0.1–1.0)
Monocytes Relative: 12 % (ref 3.0–12.0)
Neutro Abs: 4.9 10*3/uL (ref 1.4–7.7)
Neutrophils Relative %: 71.3 % (ref 43.0–77.0)
Platelets: 136 10*3/uL — ABNORMAL LOW (ref 150.0–400.0)
RBC: 4.69 Mil/uL (ref 4.22–5.81)
RDW: 14.6 % (ref 11.5–15.5)
WBC: 6.8 10*3/uL (ref 4.0–10.5)

## 2020-07-26 LAB — LIPID PANEL
Cholesterol: 150 mg/dL (ref 0–200)
HDL: 39.2 mg/dL (ref >39.00)
LDL Cholesterol: 93 mg/dL (ref 0–99)
NonHDL: 110.36
Total CHOL/HDL Ratio: 4
Triglycerides: 89 mg/dL (ref 0.0–149.0)
VLDL: 17.8 mg/dL (ref 0.0–40.0)

## 2020-07-26 LAB — COMPREHENSIVE METABOLIC PANEL
ALT: 18 U/L (ref 0–53)
AST: 15 U/L (ref 0–37)
Albumin: 4.2 g/dL (ref 3.5–5.2)
Alkaline Phosphatase: 70 U/L (ref 39–117)
BUN: 34 mg/dL — ABNORMAL HIGH (ref 6–23)
CO2: 24 mEq/L (ref 19–32)
Calcium: 8.9 mg/dL (ref 8.4–10.5)
Chloride: 111 mEq/L (ref 96–112)
Creatinine, Ser: 1.2 mg/dL (ref 0.40–1.50)
GFR: 56.38 mL/min — ABNORMAL LOW (ref >60.00)
Glucose, Bld: 112 mg/dL — ABNORMAL HIGH (ref 70–99)
Potassium: 4.7 mEq/L (ref 3.5–5.1)
Sodium: 142 mEq/L (ref 135–145)
Total Bilirubin: 1 mg/dL (ref 0.2–1.2)
Total Protein: 6.7 g/dL (ref 6.0–8.3)

## 2020-07-26 LAB — HEMOGLOBIN A1C: Hgb A1c MFr Bld: 6 % (ref 4.6–6.5)

## 2020-07-26 NOTE — Progress Notes (Signed)
Gary [name] DOB: Sep 16, 1935 Encounter date: 07/26/2020  This is a 85 y.o. male who presents with Chief Complaint  Patient presents with  . Follow-up    History of present illness: Feels that bp has been erratic - checking periodically at home - up and down 121-145/62-81. Has been more in the 140's/80's in the past. HR has been 50-51. No issues with light headed or dizziness. Rare for heart rate to jump high - highest he sees is 70's.   Working out twic weekly with Clinical research associate.  GERD: no issues with this.   Rosacea:controlled; follows with derm q 6 months.Usually gets some things cut off every time he goes. Good about regular follow up.  OSA: uses cpap every night.   Getting up 2-3 times/night for urination.      No Known Allergies Current Meds  Medication Sig  . allopurinol (ZYLOPRIM) 100 MG tablet TAKE 1 TABLET(100 MG) BY MOUTH DAILY  . co-enzyme Q-10 30 MG capsule Take 30 mg by mouth every other day.   [name] ELIQUIS 5 MG TABS tablet TAKE 1 TABLET TWICE DAILY  . fluticasone (FLONASE) 50 MCG/ACT nasal spray Place 1 spray into both nostrils every other day.  . lisinopril (ZESTRIL) 5 MG tablet Take 1.5 tablets (7.5 mg total) by mouth daily.  . minocycline (MINOCIN,DYNACIN) 100 MG capsule Take 100 mg by mouth every other day.   . Multiple Vitamin (MULTIVITAMIN) tablet Take 1 tablet by mouth daily.  . simvastatin (ZOCOR) 20 MG tablet Take 1 tablet (20 mg total) by mouth at bedtime. (Patient taking differently: Take 20 mg by mouth every other day.)  . tamsulosin (FLOMAX) 0.4 MG CAPS capsule TAKE 1 CAPSULE EVERY DAY   Current Facility-Administered Medications for the 07/26/20 encounter (Office Visit) with Caren Macadam, MD  Medication  . 0.9 %  sodium chloride infusion    Review of Systems  Constitutional: Negative for chills, fatigue and fever.  Respiratory: Negative for cough, chest tightness, shortness of breath and wheezing.   Cardiovascular: Negative for chest  pain, palpitations and leg swelling.    Objective:  BP 102/68 (BP Location: Left Arm, Patient Position: Sitting, Cuff Size: Large)   Pulse (!) 111   Temp 98 F (36.7 C) (Oral)   Ht 6\' 3"  (1.905 m)   Wt 246 lb 1.6 oz (111.6 kg)   SpO2 91%   BMI 30.76 kg/m   Weight: 246 lb 1.6 oz (111.6 kg)   BP Readings from Last 3 Encounters:  07/26/20 102/68  03/30/20 (!) 152/78  01/26/20 108/70   Wt Readings from Last 3 Encounters:  07/26/20 246 lb 1.6 oz (111.6 kg)  03/30/20 257 lb 12.8 oz (116.9 kg)  01/26/20 259 lb 1.6 oz (117.5 kg)    Physical Exam Constitutional:      General: He is not in acute distress.    Appearance: He is well-developed.  HENT:     Head: Normocephalic and atraumatic.     Right Ear: External ear normal.     Left Ear: External ear normal.     Nose: Nose normal.     Mouth/Throat:     Pharynx: No oropharyngeal exudate.  Eyes:     Conjunctiva/sclera: Conjunctivae normal.     Pupils: Pupils are equal, round, and reactive to light.  Neck:     Thyroid: No thyromegaly.  Cardiovascular:     Rate and Rhythm: Normal rate and regular rhythm.     Heart sounds: Normal heart sounds. No murmur heard. No  friction rub. No gallop.   Pulmonary:     Effort: Pulmonary effort is normal. No respiratory distress.     Breath sounds: Normal breath sounds. No stridor. No wheezing or rales.  Abdominal:     General: Bowel sounds are normal.     Palpations: Abdomen is soft.  Genitourinary:    Penis: Normal.      Testes: Normal.     Prostate: Enlarged. Not tender and no nodules present.     Rectum: Normal.  Musculoskeletal:        General: Normal range of motion.     Cervical back: Neck supple.  Skin:    General: Skin is warm and dry.  Neurological:     Mental Status: He is alert and oriented to person, place, and time.  Psychiatric:        Behavior: Behavior normal.        Thought Content: Thought content normal.        Judgment: Judgment normal.      Assessment/Plan  1. Typical atrial flutter ([place]) Has been rate controlled.  On Eliquis 5 mg twice daily  2. Essential hypertension Well-controlled on lisinopril 5 mg daily. - CBC with Differential/Platelet; Future - Comprehensive metabolic panel; Future - CBC with Differential/Platelet - Comprehensive metabolic panel  3. Obstructive sleep apnea Uses machine nightly.  4. Impaired fasting glucose Diet controlled. - Hemoglobin A1c; Future - Hemoglobin A1c  5. Rosacea Follows sister regularly.  Taking minocycline 100 mg daily.  6. Hypercholesterolemia Has been well controlled with Zocor 20 mg daily.  7. BPH associated with nocturia Well-controlled with Flomax 0.4 mg daily  8. Left knee pain, unspecified chronicity This is chronic.  He does well with his pain overall.  Exercising on a regular basis.    Return in about 6 months (around 01/26/2021) for Chronic condition visit.      [name], MD

## 2020-08-02 ENCOUNTER — Telehealth: Payer: Self-pay | Admitting: Pharmacist

## 2020-08-02 NOTE — Chronic Care Management (AMB) (Signed)
I spoke with the patient about his upcoming appointment on 08/03/2020 @ 11:30 am with the clinical pharmacist. He was asked to please have all medication on hand to review with the pharmacist. He confirmed the appointment.   Maia Breslow, [place] [phone]

## 2020-08-03 ENCOUNTER — Telehealth: Payer: Medicare HMO

## 2020-08-03 NOTE — Progress Notes (Deleted)
Chronic Care Management Pharmacy Note  08/03/2020 Name:  Gary [name] MRN:  [id number] DOB:  Sep 21, 1935  Subjective: Gary [name] is an 85 y.o. year old male who is a primary patient of Koberlein, Steele Berg, MD.  The CCM team was consulted for assistance with disease management and care coordination needs.    Engaged with patient by telephone for follow up visit in response to provider referral for pharmacy case management and/or care coordination services.   Consent to Services:  The patient was given information about Chronic Care Management services, agreed to services, and gave verbal consent prior to initiation of services.  Please see initial visit note for detailed documentation.   Patient Care Team: Caren Macadam, MD as PCP - General (Family Medicine) Croitoru, Dani Gobble, MD as PCP - Cardiology (Cardiology) Gary Matin, MD as Consulting Physician (Dermatology) Troy Sine, MD as Consulting Physician (Cardiology) Desma Maxim, MD as Referring Physician (Ophthalmology) Viona Gilmore, [facility] as Pharmacist (Pharmacist)  Recent office visits: 07/26/20 Gary Rough, MD: Patient presented for chronic conditions follow up with complaints of unstable BP. No changes made.  06/04/20 Gary Neas, LPN: Patient presented for AWV.  01/26/20 Gary Rough, MD: Patient presented for chronic diseases follow up. Labs completed. Uric acid elevated and prescribed allopurinol 100 mg daily with plans to recheck UA in 1 month. Lipid panel showed low HDL, BUN/SCr showed dehydration with plans to repeat in 1 month. A1c is stable but elevated. Patient stopped taking Protonix on his own.  Recent consult visits: 07/26/20 Gary Elizabeth, PA-C (sports medicine): Patient presented for follow up. Steroid injection given.  04/20/20 Gary [name] (dermatology): Unable to access notes.  04/08/20 Gary [name] (ophthalmology): Unable to access notes.  03/30/20 Gary Klein, MD (cardiology):  Patient presented for atrial flutter follow up.  Hospital visits: None in previous 6 months  Objective:  Lab Results  Component Value Date   CREATININE 1.20 07/26/2020   BUN 34 (H) 07/26/2020   GFR 56.38 (L) 07/26/2020   GFRNONAA 60 (L) 08/16/2017   GFRAA >60 08/16/2017   NA 142 07/26/2020   K 4.7 07/26/2020   CALCIUM 8.9 07/26/2020   CO2 24 07/26/2020   GLUCOSE 112 (H) 07/26/2020    Lab Results  Component Value Date/Time   HGBA1C 6.0 07/26/2020 09:48 AM   HGBA1C 6.0 (H) 01/26/2020 09:13 AM   GFR 56.38 (L) 07/26/2020 09:48 AM   GFR 58.62 (L) 07/14/2019 09:25 AM   MICROALBUR 60.1 (H) 04/12/2018 02:11 PM    Last diabetic Eye exam: No results found for: HMDIABEYEEXA  Last diabetic Foot exam: No results found for: HMDIABFOOTEX   Lab Results  Component Value Date   CHOL 150 07/26/2020   HDL 39.20 07/26/2020   LDLCALC 93 07/26/2020   LDLDIRECT 143.5 01/22/2012   TRIG 89.0 07/26/2020   CHOLHDL 4 07/26/2020    Hepatic Function Latest Ref Rng & Units 07/26/2020 03/08/2020 01/26/2020  Total Protein 6.0 - 8.3 g/dL 6.7 6.4 6.2  Albumin 3.5 - 5.2 g/dL 4.2 - -  AST 0 - 37 U/L 15 14 19   ALT 0 - 53 U/L 18 15 23   Alk Phosphatase 39 - 117 U/L 70 - -  Total Bilirubin 0.2 - 1.2 mg/dL 1.0 0.6 1.4(H)  Bilirubin, Direct 0.0 - 0.3 mg/dL - - -    Lab Results  Component Value Date/Time   TSH 2.19 06/26/2017 10:23 AM   TSH 2.58 06/07/2016 08:20 AM    CBC Latest Ref Rng &  Units 07/26/2020 01/26/2020 07/14/2019  WBC 4.0 - 10.5 K/uL 6.8 7.5 5.1  Hemoglobin 13.0 - 17.0 g/dL 15.6 14.0 14.9  Hematocrit 39.0 - 52.0 % 46.2 41.5 44.2  Platelets 150.0 - 400.0 K/uL 136.0(L) 134(L) 149.0(L)    No results found for: VD25OH  Clinical ASCVD: {YES/NO:21197} The ASCVD Risk score [name], et al., 2013) failed to calculate for the following reasons:   The 2013 ASCVD risk score is only valid for ages 14 to 62    Depression screen PHQ 2/9 06/04/2020 07/14/2019 07/10/2018  Decreased Interest 0 0 0   Down, Depressed, Hopeless 0 0 0  PHQ - 2 Score 0 0 0  Altered sleeping 0 - 0  Tired, decreased energy 0 - 0  Change in appetite 0 - 0  Feeling bad or failure about yourself  0 - 0  Trouble concentrating 0 - 0  Moving slowly or fidgety/restless 0 - 0  Suicidal thoughts 0 - 0  PHQ-9 Score 0 - 0  Difficult doing work/chores Not difficult at all - -     CHA2DS2/VAS Stroke Risk Points  Current as of about an hour ago     4 >= 2 Points: High Risk  1 - 1.99 Points: Medium Risk  0 Points: Low Risk    Last Change: N/A      Details    This score determines the patient's risk of having a stroke if the  patient has atrial fibrillation.       Points Metrics  0 Has Congestive Heart Failure:  No    Current as of about an hour ago  0 Has Vascular Disease:  No    Current as of about an hour ago  1 Has Hypertension:  Yes    Current as of about an hour ago  2 Age:  34    Current as of about an hour ago  1 Has Diabetes:  Yes    Current as of about an hour ago  0 Had Stroke:  No  Had TIA:  No  Had Thromboembolism:  No    Current as of about an hour ago  0 Male:  No    Current as of about an hour ago       Social History   Tobacco Use  Smoking Status Former Smoker  Smokeless Tobacco Never Used   BP Readings from Last 3 Encounters:  07/26/20 102/68  03/30/20 (!) 152/78  01/26/20 108/70   Pulse Readings from Last 3 Encounters:  07/26/20 (!) 111  03/30/20 (!) 59  01/26/20 67   Wt Readings from Last 3 Encounters:  07/26/20 246 lb 1.6 oz (111.6 kg)  03/30/20 257 lb 12.8 oz (116.9 kg)  01/26/20 259 lb 1.6 oz (117.5 kg)   BMI Readings from Last 3 Encounters:  07/26/20 30.76 kg/m  03/30/20 32.22 kg/m  01/26/20 32.39 kg/m    Assessment/Interventions: Review of patient past medical history, allergies, medications, health status, including review of consultants reports, laboratory and other test data, was performed as part of comprehensive evaluation and provision of  chronic care management services.   SDOH:  (Social Determinants of Health) assessments and interventions performed: {yes/no:20286}  SDOH Screenings   Alcohol Screen: Low Risk   . Last Alcohol Screening Score (AUDIT): 4  Depression (PHQ2-9): Low Risk   . PHQ-2 Score: 0  Financial Resource Strain: Low Risk   . Difficulty of Paying Living Expenses: Not hard at all  Food Insecurity: No Food Insecurity  .  Worried About Charity fundraiser in the Last Year: Never true  . Ran Out of Food in the Last Year: Never true  Housing: Low Risk   . Last Housing Risk Score: 0  Physical Activity: Insufficiently Active  . Days of Exercise per Week: 2 days  . Minutes of Exercise per Session: 50 min  Social Connections: Socially Integrated  . Frequency of Communication with Friends and Family: More than three times a week  . Frequency of Social Gatherings with Friends and Family: More than three times a week  . Attends Religious Services: More than 4 times per year  . Active Member of Clubs or Organizations: Yes  . Attends Archivist Meetings: Never  . Marital Status: Married  Stress: No Stress Concern Present  . Feeling of Stress : Not at all  Tobacco Use: Medium Risk  . Smoking Tobacco Use: Former Smoker  . Smokeless Tobacco Use: Never Used  Transportation Needs: No Transportation Needs  . Lack of Transportation (Medical): No  . Lack of Transportation (Non-Medical): No    CCM Care Plan  No Known Allergies  Medications Reviewed Today    Reviewed by Agnes Lawrence, CMA (Certified Medical Assistant) on 07/26/20 at [phone]  Med List Status: <None>  Medication Order Taking? Sig Documenting Provider Last Dose Status Informant  0.9 %  sodium chloride infusion [id number]   Ladene Artist, MD  Active   allopurinol (ZYLOPRIM) 100 MG tablet [id number] Yes TAKE 1 TABLET(100 MG) BY MOUTH DAILY Koberlein, Junell C, MD Taking Active   co-enzyme Q-10 30 MG capsule [id number] Yes Take 30 mg by mouth  every other day.  [provider] Taking Active Self  ELIQUIS 5 MG TABS tablet [id number] Yes TAKE 1 TABLET TWICE DAILY Croitoru, Mihai, MD Taking Active   fluticasone (FLONASE) 50 MCG/ACT nasal spray [id number] Yes Place 1 spray into both nostrils every other day. Caren Macadam, MD Taking Active   lisinopril (ZESTRIL) 5 MG tablet [id number] Yes Take 1.5 tablets (7.5 mg total) by mouth daily. Troy Sine, MD Taking Active   minocycline (MINOCIN,DYNACIN) 100 MG capsule [id number] Yes Take 100 mg by mouth every other day.  [provider] Taking Active   Multiple Vitamin (MULTIVITAMIN) tablet [id number] Yes Take 1 tablet by mouth daily. [provider] Taking Active Self  simvastatin (ZOCOR) 20 MG tablet [id number] Yes Take 1 tablet (20 mg total) by mouth at bedtime.  Patient taking differently: Take 20 mg by mouth every other day.   Caren Macadam, MD Taking Active   tamsulosin (FLOMAX) 0.4 MG CAPS capsule [id number] Yes TAKE 1 CAPSULE EVERY DAY Koberlein, Steele Berg, MD Taking Active           Patient Active Problem List   Diagnosis Date Noted  . Rosacea 07/09/2018  . Impaired fasting glucose 07/09/2018  . S/P total knee replacement 08/27/2017  . Essential hypertension 04/05/2016  . Obstructive sleep apnea 01/01/2015  . Periodic limb movement 01/01/2015  . Typical atrial flutter ([place])   . Cardiac arrhythmia 05/14/2014  . BPH associated with nocturia 05/12/2014  . Calculus of parotid gland 09/02/2013  . Murmur, heart 01/22/2012  . Aortic stenosis 12/20/2010  . LIBIDO, DECREASED 10/19/2009  . Hypercholesterolemia 10/03/2007  . ERECTILE DYSFUNCTION 10/03/2007  . ALLERGIC RHINITIS 10/03/2007    Immunization History  Administered Date(s) Administered  . Fluad Quad(high Dose 65+) 01/17/2019, 01/26/2020  . Influenza Split 01/22/2012  . Influenza, High Dose Seasonal PF 03/13/2014, 05/31/2015, 03/14/2016,  03/18/2018  . Influenza,inj,Quad PF,6+ Mos  04/15/2013  . PFIZER(Purple Top)SARS-COV-2 Vaccination 06/02/2019, 06/29/2019  . Pneumococcal Conjugate-13 04/15/2013, 05/12/2014  . Pneumococcal Polysaccharide-23 08/19/2004, 10/19/2009  . Td 08/19/2004  . Zoster Recombinat (Shingrix) 10/26/2019, 01/12/2020    Conditions to be addressed/monitored:  Hypertension, Hyperlipidemia, BPH, Allergic Rhinitis and prediabetes, elevated uric acid, rosacea  There are no care plans that you recently modified to display for this patient.   Current Barriers:  . {pharmacybarriers:24917} . ***  Pharmacist Clinical Goal(s):  [name] Patient will {PHARMACYGOALCHOICES:24921} through collaboration with PharmD and provider.  . ***  Interventions: . 1:1 collaboration with Caren Macadam, MD regarding development and update of comprehensive plan of care as evidenced by provider attestation and co-signature . Inter-disciplinary care team collaboration (see longitudinal plan of care) . Comprehensive medication review performed; medication list updated in electronic medical record   Hypertension (BP goal <140/90) -Controlled -Current treatment:  Lisinopril 5 mg 1.5 tablets daily -Medications previously tried: ***  -Current home readings: *** (prev 118/69, 120/70s, 135/75 (highest)) -Current dietary habits: *** -Current exercise habits: *** -{ACTIONS;DENIES/REPORTS:21021675::"Denies"} hypotensive/hypertensive symptoms -Educated on {CCM BP Counseling:25124} -Counseled to monitor BP at home ***, document, and provide log at future appointments -{CCMPHARMDINTERVENTION:25122}  Hyperlipidemia: (LDL goal < 100) -{US controlled/uncontrolled:25276} -Current treatment:  Simvastatin 20 mg 1 tablet every other day -Medications previously tried: ***  -Current dietary patterns: *** -Current exercise habits: *** -Educated on {CCM HLD Counseling:25126} -{CCMPHARMDINTERVENTION:25122}  Atrial Flutter (Goal: prevent stroke and major bleeding) -{US  controlled/uncontrolled:25276} -CHADSVASC: 3 -Current treatment: . Rate control: none  Anticoagulation: Eliquis 5 mg 1 tablet twice daily -Medications previously tried: *** -Home BP and HR readings: ***  -Counseled on {CCMAFIBCOUNSELING:25120} -{CCMPHARMDINTERVENTION:25122}  -appropriate dose  Elevated uric acid (Goal: uric acid < 6) -Uncontrolled -Current treatment  . Allopurinol 100 mg 1 tablet daily -Medications previously tried: none -{CCMPHARMDINTERVENTION:25122}  BPH (Goal: ***) -{US controlled/uncontrolled:25276} -Current treatment   tamsulosin 0.4 mg 1 capsule daily -Medications previously tried: ***  -{CCMPHARMDINTERVENTION:25122}  Allergic rhinitis (Goal: ***) -{US controlled/uncontrolled:25276} -Current treatment   Fluticasone 50 mcg/act 1 spray in nostril every other day -Medications previously tried: ***  -{CCMPHARMDINTERVENTION:25122}  Rosacea (Goal: ***) -{US controlled/uncontrolled:25276} -Current treatment   Minocycline 100 mg 1 capsule every other day -Medications previously tried: ***  -{CCMPHARMDINTERVENTION:25122}  Vaccines   Reviewed and discussed patient's vaccination history.    Immunization History  Administered Date(s) Administered  . Fluad Quad(high Dose 65+) 01/17/2019, 01/26/2020  . Influenza Split 01/22/2012  . Influenza, High Dose Seasonal PF 03/13/2014, 05/31/2015, 03/14/2016, 03/18/2018  . Influenza,inj,Quad PF,6+ Mos 04/15/2013  . PFIZER(Purple Top)SARS-COV-2 Vaccination 06/02/2019, 06/29/2019  . Pneumococcal Conjugate-13 04/15/2013, 05/12/2014  . Pneumococcal Polysaccharide-23 08/19/2004, 10/19/2009  . Td 08/19/2004  . Zoster Recombinat (Shingrix) 10/26/2019, 01/12/2020    Plan  Recommended patient receive *** vaccine in *** office.   Health Maintenance -Vaccine gaps: none -Current therapy:   CoQ10 30 mg 1 capsule every other day - taking same day as simvastatin   Multivitamin 1 tablet every day -Educated on  {ccm supplement counseling:25128} -{CCM Patient satisfied:25129} -{CCMPHARMDINTERVENTION:25122}   Patient Goals/Self-Care Activities . Patient will:  - {pharmacypatientgoals:24919}  Follow Up Plan: {CM FOLLOW UP OMBT:59741}   Medication Assistance: {MEDASSISTANCEINFO:25044}  Patient's preferred pharmacy is:  [facility], [address] [place] [address] Phone: [phone] Fax: [phone]  Uses pill box? No - drawer full of medicines - takes medicines after breakfast and at tonight Pt endorses 90% compliance  We discussed: Current pharmacy is preferred with insurance plan and patient is satisfied with pharmacy services Patient decided to: Continue current medication management strategy  Care Plan and Follow Up Patient Decision:  Patient agrees to Care Plan and Follow-up.  Plan: {CM FOLLOW UP PGFQ:42103}  [name], PharmD [facility] Clinical Pharmacist [place] at [place]

## 2020-08-06 DIAGNOSIS — G4733 Obstructive sleep apnea (adult) (pediatric): Secondary | ICD-10-CM | POA: Diagnosis not present

## 2020-08-09 ENCOUNTER — Telehealth: Payer: Self-pay | Admitting: Family Medicine

## 2020-08-09 MED ORDER — ALLOPURINOL 100 MG PO TABS: ORAL_TABLET | ORAL | 1 refills | Status: DC

## 2020-08-09 NOTE — Telephone Encounter (Signed)
Rx sent to [facility] for a total of a 6 month supply.

## 2020-08-09 NOTE — Telephone Encounter (Signed)
The Patient is requesting a refill on the medication allopurinol (ZYLOPRIM) 100 MG tablet. sent to  The   [place], [place]  Phone:  [phone] Fax:  [phone]  three refills with a 90 day supply

## 2020-08-11 ENCOUNTER — Other Ambulatory Visit: Payer: Self-pay | Admitting: Family Medicine

## 2020-08-16 ENCOUNTER — Telehealth: Payer: Self-pay | Admitting: Pharmacist

## 2020-08-16 NOTE — Progress Notes (Signed)
    Chronic Care Management Pharmacy Assistant   Name: Gary [name]  MRN: [id number] DOB: [birth year]  Reason for Encounter:General Disease State Call   Conditions to be addressed/monitored: HTN, HLD, A flutter, BPH, elevated uric acid, allergic rhinitis, rosacea  Recent office visits:  07/26/20 Dr. Ethlyn Gallery For follow-up. Per note: Continue all current medications. No medication changes.  Recent consult visits:  3/38/22 Orthopedic Surgery Donia Ast, Pa-C. For pain; left knee. Per note: Patient received injections in his left knee to help relieve pain. Xray's performed. No medication changes. 06/14/20 Ophthalmology Stonecipher, Peterson Ao. No information given. 04/20/20 Dermatology Jarome Matin. No information given. 04/08/20 Retina Ophthalmology Desma Maxim. No information given.  03/30/20 Cardiology Croitoru, Dani Gobble, MD. For Follow-Up. No mediation changes.    Hospital visits:  None in previous 6 months  Medications: Outpatient Encounter Medications as of 08/16/2020  Medication Sig  . allopurinol (ZYLOPRIM) 100 MG tablet TAKE 1 TABLET(100 MG) BY MOUTH DAILY  . co-enzyme Q-10 30 MG capsule Take 30 mg by mouth every other day.   [name] ELIQUIS 5 MG TABS tablet TAKE 1 TABLET TWICE DAILY  . fluticasone (FLONASE) 50 MCG/ACT nasal spray Place 1 spray into both nostrils every other day.  . lisinopril (ZESTRIL) 5 MG tablet Take 1.5 tablets (7.5 mg total) by mouth daily.  . minocycline (MINOCIN,DYNACIN) 100 MG capsule Take 100 mg by mouth every other day.   . Multiple Vitamin (MULTIVITAMIN) tablet Take 1 tablet by mouth daily.  . simvastatin (ZOCOR) 20 MG tablet Take 1 tablet (20 mg total) by mouth at bedtime. (Patient taking differently: Take 20 mg by mouth every other day.)  . tamsulosin (FLOMAX) 0.4 MG CAPS capsule TAKE 1 CAPSULE EVERY DAY   Facility-Administered Encounter Medications as of 08/16/2020  Medication  . 0.9 %  sodium chloride infusion    GEN CALL: Patient stated he  is very active he stated he workouts with a trainer twice a week, plays golf, does stuff in the yard. He stated he eats out about 2-3 times a week. He stated he usually eats three meals daily, a big breakfast and fair lunch and a small dinner. Spoke to him about what the appointment with pharmacist is for and the benefits that come with speaking with her. Patient R/S for 4/28 at 9 am for OTP appointment.   Star Rating Drugs: Lisinopril 5 mg 90 Ds 05/25/20  Simvastatin 20 mg 90 DS 12/02/19  30 min  Follow-Up:Pharmacist Review  Charlann Lange, RMA Clinical Pharmacist Assistant [phone]

## 2020-08-25 ENCOUNTER — Telehealth: Payer: Self-pay | Admitting: Pharmacist

## 2020-08-25 NOTE — Chronic Care Management (AMB) (Signed)
I left the patient a message about his upcoming appointment on 08/26/20 @ 9:00 am with the clinical pharmacist. This appointment was rescheduled to May 11th at 1:30 PM. Patient confirmed new appointment.  Maia Breslow, [place] [phone]

## 2020-09-07 ENCOUNTER — Telehealth: Payer: Self-pay | Admitting: Pharmacist

## 2020-09-07 NOTE — Chronic Care Management (AMB) (Signed)
    Chronic Care Management Pharmacy Assistant   Name: KRAYTON WORTLEY  MRN: [id number] DOB: April 18, 1936  Spoke with patient and reminded him of his appointment on 09/08/20 at 1pm by phone with [name] clinical pharmacist. Advised patient to have all medications and any blood pressure or blood glucose reading close by and available for review. Patient thanked me for my call.  [place] [phone]

## 2020-09-08 ENCOUNTER — Ambulatory Visit (INDEPENDENT_AMBULATORY_CARE_PROVIDER_SITE_OTHER): Payer: Medicare HMO | Admitting: Pharmacist

## 2020-09-08 DIAGNOSIS — E785 Hyperlipidemia, unspecified: Secondary | ICD-10-CM | POA: Diagnosis not present

## 2020-09-08 DIAGNOSIS — I10 Essential (primary) hypertension: Secondary | ICD-10-CM | POA: Diagnosis not present

## 2020-09-08 NOTE — Progress Notes (Signed)
Chronic Care Management Pharmacy Note  09/27/2020 Name:  Gary [name] MRN:  [id number] DOB:  14-Apr-1936  Subjective: Gary [name] is an 85 y.o. year old male who is a primary patient of Koberlein, Steele Berg, MD.  The CCM team was consulted for assistance with disease management and care coordination needs.    Engaged with patient by telephone for follow up visit in response to provider referral for pharmacy case management and/or care coordination services.   Consent to Services:  The patient was given information about Chronic Care Management services, agreed to services, and gave verbal consent prior to initiation of services.  Please see initial visit note for detailed documentation.   Patient Care Team: Caren Macadam, MD as PCP - General (Family Medicine) Croitoru, Dani Gobble, MD as PCP - Cardiology (Cardiology) Jarome Matin, MD as Consulting Physician (Dermatology) Troy Sine, MD as Consulting Physician (Cardiology) Desma Maxim, MD as Referring Physician (Ophthalmology) Viona Gilmore, [facility] as Pharmacist (Pharmacist)  Recent office visits: 07/26/20 [name], MD: Patient presented for chronic conditions follow up. No medication changes.  06/04/20 [name], LPN: Patient presented for AWV.  Recent consult visits: 07/20/20 Carlyon Shadow, PA-C (sports medicine): Patient presented for acute pain of left knee. Steroid injection administered.  07/20/20 Ophthalmology: Patient presented for eye exam.  04/20/20 Jarome Matin, MD (dermatology): Unable to access notes.  04/08/20 [name] (ophthalmology): Unable to access notes.  03/30/20 [name], MD (cardiology): Patient presented for Aflutter follow up.   Hospital visits: None in previous 6 months  Objective:  Lab Results  Component Value Date   CREATININE 1.20 07/26/2020   BUN 34 (H) 07/26/2020   GFR 56.38 (L) 07/26/2020   GFRNONAA 60 (L) 08/16/2017   GFRAA >60 08/16/2017   NA 142  07/26/2020   K 4.7 07/26/2020   CALCIUM 8.9 07/26/2020   CO2 24 07/26/2020   GLUCOSE 112 (H) 07/26/2020    Lab Results  Component Value Date/Time   HGBA1C 6.0 07/26/2020 09:48 AM   HGBA1C 6.0 (H) 01/26/2020 09:13 AM   GFR 56.38 (L) 07/26/2020 09:48 AM   GFR 58.62 (L) 07/14/2019 09:25 AM   MICROALBUR 60.1 (H) 04/12/2018 02:11 PM    Last diabetic Eye exam: No results found for: HMDIABEYEEXA  Last diabetic Foot exam: No results found for: HMDIABFOOTEX   Lab Results  Component Value Date   CHOL 150 07/26/2020   HDL 39.20 07/26/2020   LDLCALC 93 07/26/2020   LDLDIRECT 143.5 01/22/2012   TRIG 89.0 07/26/2020   CHOLHDL 4 07/26/2020    Hepatic Function Latest Ref Rng & Units 07/26/2020 03/08/2020 01/26/2020  Total Protein 6.0 - 8.3 g/dL 6.7 6.4 6.2  Albumin 3.5 - 5.2 g/dL 4.2 - -  AST 0 - 37 U/L 15 14 19   ALT 0 - 53 U/L 18 15 23   Alk Phosphatase 39 - 117 U/L 70 - -  Total Bilirubin 0.2 - 1.2 mg/dL 1.0 0.6 1.4(H)  Bilirubin, Direct 0.0 - 0.3 mg/dL - - -    Lab Results  Component Value Date/Time   TSH 2.19 06/26/2017 10:23 AM   TSH 2.58 06/07/2016 08:20 AM    CBC Latest Ref Rng & Units 07/26/2020 01/26/2020 07/14/2019  WBC 4.0 - 10.5 K/uL 6.8 7.5 5.1  Hemoglobin 13.0 - 17.0 g/dL 15.6 14.0 14.9  Hematocrit 39.0 - 52.0 % 46.2 41.5 44.2  Platelets 150.0 - 400.0 K/uL 136.0(L) 134(L) 149.0(L)    No results found for: VD25OH  Clinical  ASCVD: No  The ASCVD Risk score [name], et al., 2013) failed to calculate for the following reasons:   The 2013 ASCVD risk score is only valid for ages 30 to 62    Depression screen PHQ 2/9 06/04/2020 07/14/2019 07/10/2018  Decreased Interest 0 0 0  Down, Depressed, Hopeless 0 0 0  PHQ - 2 Score 0 0 0  Altered sleeping 0 - 0  Tired, decreased energy 0 - 0  Change in appetite 0 - 0  Feeling bad or failure about yourself  0 - 0  Trouble concentrating 0 - 0  Moving slowly or fidgety/restless 0 - 0  Suicidal thoughts 0 - 0  PHQ-9 Score 0 - 0   Difficult doing work/chores Not difficult at all - -     CHA2DS2/VAS Stroke Risk Points  Current as of a minute ago     4 >= 2 Points: High Risk  1 - 1.99 Points: Medium Risk  0 Points: Low Risk    Last Change: N/A      Details    This score determines the patient's risk of having a stroke if the  patient has atrial fibrillation.       Points Metrics  0 Has Congestive Heart Failure:  No    Current as of a minute ago  0 Has Vascular Disease:  No    Current as of a minute ago  1 Has Hypertension:  Yes    Current as of a minute ago  2 Age:  41    Current as of a minute ago  1 Has Diabetes:  Yes    Current as of a minute ago  0 Had Stroke:  No  Had TIA:  No  Had Thromboembolism:  No    Current as of a minute ago  0 Male:  No    Current as of a minute ago     Social History   Tobacco Use  Smoking Status Former Smoker  Smokeless Tobacco Never Used   BP Readings from Last 3 Encounters:  09/16/20 (!) 158/78  07/26/20 102/68  03/30/20 (!) 152/78   Pulse Readings from Last 3 Encounters:  09/16/20 (!) 59  07/26/20 (!) 111  03/30/20 (!) 59   Wt Readings from Last 3 Encounters:  09/16/20 250 lb (113.4 kg)  07/26/20 246 lb 1.6 oz (111.6 kg)  03/30/20 257 lb 12.8 oz (116.9 kg)   BMI Readings from Last 3 Encounters:  09/16/20 31.25 kg/m  07/26/20 30.76 kg/m  03/30/20 32.22 kg/m    Assessment/Interventions: Review of patient past medical history, allergies, medications, health status, including review of consultants reports, laboratory and other test data, was performed as part of comprehensive evaluation and provision of chronic care management services.   SDOH:  (Social Determinants of Health) assessments and interventions performed: No  SDOH Screenings   Alcohol Screen: Low Risk   . Last Alcohol Screening Score (AUDIT): 4  Depression (PHQ2-9): Low Risk   . PHQ-2 Score: 0  Financial Resource Strain: Low Risk   . Difficulty of Paying Living Expenses: Not  hard at all  Food Insecurity: No Food Insecurity  . Worried About Charity fundraiser in the Last Year: Never true  . Ran Out of Food in the Last Year: Never true  Housing: Low Risk   . Last Housing Risk Score: 0  Physical Activity: Insufficiently Active  . Days of Exercise per Week: 2 days  . Minutes of Exercise per Session:  50 min  Social Connections: Socially Integrated  . Frequency of Communication with Friends and Family: More than three times a week  . Frequency of Social Gatherings with Friends and Family: More than three times a week  . Attends Religious Services: More than 4 times per year  . Active Member of Clubs or Organizations: Yes  . Attends Archivist Meetings: Never  . Marital Status: Married  Stress: No Stress Concern Present  . Feeling of Stress : Not at all  Tobacco Use: Medium Risk  . Smoking Tobacco Use: Former Smoker  . Smokeless Tobacco Use: Never Used  Transportation Needs: No Transportation Needs  . Lack of Transportation (Medical): No  . Lack of Transportation (Non-Medical): No    CCM Care Plan  No Known Allergies  Medications Reviewed Today    Reviewed by Troy Sine, MD (Physician) on 09/23/20 at [place] List Status: <None>  Medication Order Taking? Sig Documenting Provider Last Dose Status Informant  0.9 %  sodium chloride infusion [id number]   Ladene Artist, MD  Active   allopurinol (ZYLOPRIM) 100 MG tablet [id number] Yes TAKE 1 TABLET(100 MG) BY MOUTH DAILY Koberlein, Junell C, MD Taking Active   co-enzyme Q-10 30 MG capsule [id number] Yes Take 30 mg by mouth every other day.  [provider] Taking Active Self  ELIQUIS 5 MG TABS tablet [id number] Yes TAKE 1 TABLET TWICE DAILY Croitoru, Mihai, MD Taking Active   fluticasone (FLONASE) 50 MCG/ACT nasal spray [id number] Yes Place 1 spray into both nostrils every other day. Caren Macadam, MD Taking Active   lisinopril (ZESTRIL) 5 MG tablet [id number]  Take 2 tablets (10 mg  total) by mouth daily. Troy Sine, MD  Active   minocycline (MINOCIN,DYNACIN) 100 MG capsule [id number] Yes Take 100 mg by mouth every other day.  [provider] Taking Active   Multiple Vitamin (MULTIVITAMIN) tablet [id number] Yes Take 1 tablet by mouth daily. [provider] Taking Active Self  simvastatin (ZOCOR) 20 MG tablet [id number]  Take 1 tablet (20 mg total) by mouth every other day. Caren Macadam, MD  Active   tamsulosin (FLOMAX) 0.4 MG CAPS capsule [id number] Yes TAKE 1 CAPSULE EVERY DAY Koberlein, Steele Berg, MD Taking Active           Patient Active Problem List   Diagnosis Date Noted  . Rosacea 07/09/2018  . Impaired fasting glucose 07/09/2018  . S/P total knee replacement 08/27/2017  . Essential hypertension 04/05/2016  . Obstructive sleep apnea 01/01/2015  . Periodic limb movement 01/01/2015  . Typical atrial flutter ([place])   . Cardiac arrhythmia 05/14/2014  . BPH associated with nocturia 05/12/2014  . Calculus of parotid gland 09/02/2013  . Murmur, heart 01/22/2012  . Aortic stenosis 12/20/2010  . LIBIDO, DECREASED 10/19/2009  . Hypercholesterolemia 10/03/2007  . ERECTILE DYSFUNCTION 10/03/2007  . ALLERGIC RHINITIS 10/03/2007    Immunization History  Administered Date(s) Administered  . Fluad Quad(high Dose 65+) 01/17/2019, 01/26/2020  . Influenza Split 01/22/2012  . Influenza, High Dose Seasonal PF 03/13/2014, 05/31/2015, 03/14/2016, 03/18/2018  . Influenza,inj,Quad PF,6+ Mos 04/15/2013  . PFIZER(Purple Top)SARS-COV-2 Vaccination 06/02/2019, 06/29/2019  . Pneumococcal Conjugate-13 04/15/2013, 05/12/2014  . Pneumococcal Polysaccharide-23 08/19/2004, 10/19/2009  . Td 08/19/2004  . Tdap 05/13/2016  . Zoster Recombinat (Shingrix) 10/26/2019, 01/12/2020    Conditions to be addressed/monitored:  Hypertension, Hyperlipidemia, Atrial Fibrillation, BPH, Gout, Allergic Rhinitis and Rosacea  Care Plan : [place]  Updates  made by  Viona Gilmore, RPH since 09/27/2020 12:00 AM    Problem: Problem: Hypertension, Hyperlipidemia, Atrial Fibrillation, BPH, Gout, Allergic Rhinitis and Rosacea     Long-Range Goal: Patient-Specific Goal   Start Date: 09/08/2020  Expected End Date: 09/08/2021  This Visit's Progress: On track  Priority: High  Note:   Current Barriers:  . Unable to independently monitor therapeutic efficacy  Pharmacist Clinical Goal(s):  [name] Patient will achieve adherence to monitoring guidelines and medication adherence to achieve therapeutic efficacy through collaboration with PharmD and provider.   Interventions: . 1:1 collaboration with Caren Macadam, MD regarding development and update of comprehensive plan of care as evidenced by provider attestation and co-signature . Inter-disciplinary care team collaboration (see longitudinal plan of care) . Comprehensive medication review performed; medication list updated in electronic medical record  Hypertension (BP goal <140/90) -Controlled -Current treatment:  Lisinopril 5 mg 1.5 tablets daily -Medications previously tried:  -Current home readings: 145/80, 145/75, 140/75 (last 30 days), 135/76 - tries to do every day (5-6 weeks) -Current dietary habits: limiting added salt and switching out for a salt substitute if possible -Current exercise habits: works out with a trainer twice a week; working with the trainer -Denies hypotensive/hypertensive symptoms other than orthostatic hypotension -Educated on Exercise goal of 150 minutes per week; Importance of home blood pressure monitoring; Proper BP monitoring technique; -Counseled to monitor BP at home a few times a week, document, and provide log at future appointments -Counseled on diet and exercise extensively Recommended to continue current medication Plan to repeat blood pressure assessment in a few weeks  Hyperlipidemia: (LDL goal < 70) -Controlled -Current treatment: . Simvastatin 20  mg 1 tablet every other day  -Medications previously tried: none  -Current dietary patterns: eats out a few days a week -Current exercise habits: working out with a Physiological scientist -Educated on Cholesterol goals;  Benefits of statin for ASCVD risk reduction; Importance of limiting foods high in cholesterol; Exercise goal of 150 minutes per week; -Counseled on diet and exercise extensively Recommended to continue current medication Recommended getting an updated prescription for every other day.  Atrial Flutter (Goal: prevent stroke and major bleeding) -Controlled -CHADSVASC: 3 -Current treatment: . Rate control: none . Anticoagulation: Eliquis 5 mg 1 tablet twice daily -Medications previously tried: none -Home BP and HR readings: previously reported  -Counseled on increased risk of stroke due to Afib and benefits of anticoagulation for stroke prevention; avoidance of NSAIDs due to increased bleeding risk with anticoagulants; -Recommended to continue current medication  BPH (Goal: minimize symptoms of enlarged prostate) -Controlled -Current treatment  . tamsulosin 0.4 mg 1 capsule daily -Medications previously tried: none  -Recommended to continue current medication  Elevated uric acid (Goal: uric acid < 6) -Uncontrolled -Current treatment  . Allopurinol 100 mg 1 tablet daily -Medications previously tried: none  -Educated on limiting alcohol intake and avoiding foods that can cause gout flare ups (shellfish, organ meats, soda, etc.); patient currently drinks 4-5 glasses of wine per week  Allergic rhinitis (Goal: minimize symptoms) -Controlled -Current treatment  . Fluticasone 50 mcg/act 1 spray in nostril every other day -Medications previously tried: none  -Recommended to continue current medication Educated on avoidance of allergy triggers  Rosacea (Goal: minimize symptoms) -Controlled -Current treatment  . Minocycline 100 mg 1 capsule every other day -Medications  previously tried: none  -Recommended to continue current medication   Health Maintenance -Vaccine gaps: COVID booster -Current therapy:   CoQ10 30 mg 1 capsule every other day -  taking same day as simvastatin   Multivitamin 1 tablet every day -Educated on Cost vs benefit of each product must be carefully weighed by individual consumer -Patient is satisfied with current therapy and denies issues -Recommended to continue current medication  Patient Goals/Self-Care Activities . Patient will:  - check blood pressure a few times a week, document, and provide at future appointments target a minimum of 150 minutes of moderate intensity exercise weekly  Follow Up Plan: Telephone follow up appointment with care management team member scheduled for: 6 months (sooner depending on BP)       Medication Assistance: None required.  Patient affirms current coverage meets needs.  Patient's preferred pharmacy is:  [facility] [place], [address] [place] [address] Phone: [phone] Fax: [phone]  [facility] [address], [address] AT [facility] [address] [place] [address] Phone: [phone] Fax: [phone]  Uses pill box? No - drawer full of medicines - takes medicines after breakfast and at tonight Pt endorses 90% compliance  We discussed: Current pharmacy is preferred with insurance plan and patient is satisfied with pharmacy services Patient decided to: Continue current medication management strategy  Care Plan and Follow Up Patient Decision:  Patient agrees to Care Plan and Follow-up.  Plan: The care management team will reach out to the patient again over the next 30 days days.  [name], PharmD [facility] Clinical Pharmacist [place] at [place]

## 2020-09-09 DIAGNOSIS — H43813 Vitreous degeneration, bilateral: Secondary | ICD-10-CM | POA: Diagnosis not present

## 2020-09-09 DIAGNOSIS — H34832 Tributary (branch) retinal vein occlusion, left eye, with macular edema: Secondary | ICD-10-CM | POA: Diagnosis not present

## 2020-09-09 DIAGNOSIS — H4323 Crystalline deposits in vitreous body, bilateral: Secondary | ICD-10-CM | POA: Diagnosis not present

## 2020-09-09 DIAGNOSIS — H35361 Drusen (degenerative) of macula, right eye: Secondary | ICD-10-CM | POA: Diagnosis not present

## 2020-09-16 ENCOUNTER — Encounter: Payer: Self-pay | Admitting: Cardiovascular Disease

## 2020-09-16 ENCOUNTER — Other Ambulatory Visit: Payer: Self-pay | Admitting: Family Medicine

## 2020-09-16 ENCOUNTER — Other Ambulatory Visit: Payer: Self-pay

## 2020-09-16 ENCOUNTER — Ambulatory Visit: Payer: Medicare HMO | Admitting: Cardiovascular Disease

## 2020-09-16 VITALS — BP 158/78 | HR 59 | Ht 75.0 in | Wt 250.0 lb

## 2020-09-16 DIAGNOSIS — I483 Typical atrial flutter: Secondary | ICD-10-CM

## 2020-09-16 DIAGNOSIS — E785 Hyperlipidemia, unspecified: Secondary | ICD-10-CM | POA: Diagnosis not present

## 2020-09-16 DIAGNOSIS — G4733 Obstructive sleep apnea (adult) (pediatric): Secondary | ICD-10-CM | POA: Diagnosis not present

## 2020-09-16 DIAGNOSIS — Z7901 Long term (current) use of anticoagulants: Secondary | ICD-10-CM | POA: Diagnosis not present

## 2020-09-16 DIAGNOSIS — I10 Essential (primary) hypertension: Secondary | ICD-10-CM | POA: Diagnosis not present

## 2020-09-16 MED ORDER — LISINOPRIL 5 MG PO TABS: 10.0000 mg | ORAL_TABLET | Freq: Every day | ORAL | 3 refills | Status: DC

## 2020-09-16 MED ORDER — SIMVASTATIN 20 MG PO TABS: 20.0000 mg | ORAL_TABLET | ORAL | 1 refills | Status: DC

## 2020-09-16 NOTE — Patient Instructions (Signed)
Medication Instructions:  INCREASE lisinopril to 10mg  (two 5mg  tablets) once daily.  *If you need a refill on your cardiac medications before your next appointment, please call your pharmacy*   Lab Work: None ordered.   Testing/Procedures: None ordered.   Follow-Up: At [facility], you and your health needs are our priority.  As part of our continuing mission to provide you with exceptional heart care, we have created designated Provider Care Teams.  These Care Teams include your primary Cardiologist (physician) and Advanced Practice Providers (APPs -  Physician Assistants and Nurse Practitioners) who all work together to provide you with the care you need, when you need it.  We recommend signing up for the patient portal called "MyChart".  Sign up information is provided on this After Visit Summary.  MyChart is used to connect with patients for Virtual Visits (Telemedicine).  Patients are able to view lab/test results, encounter notes, upcoming appointments, etc.  Non-urgent messages can be sent to your provider as well.   To learn more about what you can do with MyChart, go to [email or web address].    Your next appointment:   Follow up within 90 days of receiving your new CPAP, or in 1 year, whichever comes first.   The format for your next appointment:   In Person  Provider:   Shelva Majestic, MD

## 2020-09-16 NOTE — Progress Notes (Signed)
Patient ID: CORDERRO KOLOSKI, male   DOB: August 24, 1935, 85 y.o.   MRN: [id number]      HPI: WALEED DETTMAN, is a 85 y.o. male who presents for a 19 month f/u evaluation.  [name]  is a patient of Dr. Sallyanne Kuster.  He is status post cardioversion for atrial flutter, and has a history of hyperlipidemia and aortic valve sclerosis.  He was referred for a sleep study which was done in May 2016 which revealed moderate obstructive sleep apnea with an AHI of 15.9 overall and 20.5 per hour during REM sleep.  He had reduced sleep efficiency at 54.8%.  Latency to sleep onset was prolonged at 36 minutes.  There was mild snoring.  Oxygen desaturated to 89%.  He underwent a CPAP titration trial and adnexal response to CPAP therapy with the initial recommended CPAP pressure 9 cm water pressure.  Due to oral venting, a nasal pillow mask with chinstrap was recommended.  Since initiating CPAP therapy, he has felt well.  He typically goes to bed at 10:30 and wakes up the proximal he 7:30 AM.  A download was obtained from 11/22/2014 through 12/21/2014.  Compliance was excellent with 100% days used and 100% of days used greater than 4 hours.  He is averaging 7 hours and 30 minutes of sleep per night.  On his 22 m water pressure, AHI is excellent at 0.8.  There is no significant mask leak and he has a ResMed AirFit P10 medium size mask.  A download was obtained from 11/28/2015 through 12/27/2015.  This confirms 100% compliance both with usage stays and greater than 4 hours of use.  He is averaging 7 hours and 22 minutes of use per night.  He is set at a 9 cm water pressure.  AHI remains excellent at 1.2 per hour.  He does have increased leak with 95th percent average at 30.0.  Despite this mild leak newborn nasal pillows.  His AHI remains excellent and undoubtedly is probably slightly less than its current reading.  He denied residual snoring.  Sleep is restorative.  He denied daytime sleepiness or bruxism.[name]   He was found to  have increased periodic limb movements on his initial sleep study and had increased PLMS index of 19.7, but denies painful restless legs or the "urge to move."  Epworth Sleepiness Scale: Situation   Chance of Dozing/Sleeping (0 = never , 1 = slight chance , 2 = moderate chance , 3 = high chance )   sitting and reading 1   watching TV 1   sitting inactive in a public place 0   being a passenger in a motor vehicle for an hour or more 1   lying down in the afternoon 2   sitting and talking to someone 0   sitting quietly after lunch (no alcohol) 0   while stopped for a few minutes in traffic as the driver 0   Total Score  5   I saw him in October 2018 at which time he continued to have 100% compliance was CPAP therapy.  He was averaging 6 hours and 26 minutes per night of use.  At a set pressure of 9 cm, AHI was excellent at 0.7.  He has a Special educational needs teacher P49mdium-size with chinstrap mask.  There was minimal leak intermittently.  An Epworth Sleepiness Scale score was recalculated and this endorsed at 4.   He was levaluated by me in November 2019 at which time he continued to do well  with reference to his sleep apnea. A new download was obtained in the office today from October 11 through March 09, 2018.  He continued to be compliant and is averaging 7 hours and 36 minutes of CPAP use per night.  AHI remains excellent at 0.7.  At times he has had some irritation from his mask.  His sleep is restorative.  He is unaware of breakthrough snoring.  He continues to have atrial flutter which is chronic and is followed by Dr. Sallyanne Kuster.    He developed COVID-19 infection in December, 2020 and was last seen in our office in January 2021 by [name], PA.  He was having some palpitations.  I last saw him in November 2019.  At that time he continued to be on CPAP therapy with excellent compliane. A download was obtained from August 18, 2019 through Sep 16, 2019 which confirms 100% compliance with average usage 7  hours and 14 minutes.  At 9 cm set pressure, AHI is excellent at 0.5.  He has a DreamWear nasal mask.  He is sleeping well.  He wasunaware of breakthrough snoring.  He typically goes to bed between 11 and 1130 and wakes up at 7:30 AM.  Blood pressure is recently been slightly increased.    He last saw Dr. Sallyanne Kuster November 2021 and remained stable with asymptomatic and spontaneously rate controlled atrial flutter.  He was not having symptoms of bradycardia and it was not felt he needed a pacemaker.  He had continued to be on Eliquis.  Presently, he has continued to use CPAP.  His CPAP unit is in ResMed air sense 10 auto unit at 9 cm set pressure.  With his older unit having only 3G capability, effective mid April, we are not able to access his data wirelessly since only 5G units are now able to be accessed.  However, download was obtained from March 1 through August 08, 2020 which shows excellent compliance with average sleep at 7 hours and 31 minutes with CPAP and 98% of usage days.  At 9 cm set pressure, AHI is 0.5.  He has a DreamWear mask.  He is exercising 2 days/week with a trainer.  He has noticed his blood pressure being elevated at home.  He presents for evaluation.  Past Medical History:  Diagnosis Date  . Adenomatous colon polyp 06/2000  . ALLERGIC RHINITIS 10/03/2007  . Allergy    seasonal  . Arthritis    knees  . Atrial flutter ([place]) 06/26/2014  . Cancer ([place])    skin cancers removed in the past basal cell and squamous cell  . CARDIAC MURMUR, AORTIC 10/03/2007  . ERECTILE DYSFUNCTION 10/03/2007  . High blood pressure 03/2016  . HYPERLIPIDEMIA 10/03/2007  . Rosacea   . Sleep apnea     Past Surgical History:  Procedure Laterality Date  . ADENOIDECTOMY    . CARDIOVERSION N/A 07/01/2014   Procedure: CARDIOVERSION;  Surgeon: [name], MD;  Location: [facility] ENDOSCOPY;  Service: Cardiovascular;  Laterality: N/A;  . COLONOSCOPY    . PALATE SURGERY    . TEE WITHOUT CARDIOVERSION N/A  07/01/2014   Procedure: TRANSESOPHAGEAL ECHOCARDIOGRAM (TEE);  Surgeon: [name], MD;  Location[facility] END[facility]COPY;  Service: Cardiovascular;  Laterality: N/A;  . TONSILLECTOMY    . TOTAL KNEE ARTHROPLASTY Right 08/27/2017   Procedure: RIGHT TOTAL KNEE ARTHROPLASTY;  Surgeon: Vickey Huger, MD;  Location: [place];  Service: Orthopedics;  Laterality: Right;    No Known Allergies  Current Outpatient Medications  Medication Sig Dispense Refill  . allopurinol (ZYLOPRIM) 100 MG tablet TAKE 1 TABLET(100 MG) BY MOUTH DAILY 90 tablet 1  . co-enzyme Q-10 30 MG capsule Take 30 mg by mouth every other day.     [name] ELIQUIS 5 MG TABS tablet TAKE 1 TABLET TWICE DAILY 180 tablet 1  . fluticasone (FLONASE) 50 MCG/ACT nasal spray Place 1 spray into both nostrils every other day. 32 g 6  . minocycline (MINOCIN,DYNACIN) 100 MG capsule Take 100 mg by mouth every other day.     . Multiple Vitamin (MULTIVITAMIN) tablet Take 1 tablet by mouth daily.    . tamsulosin (FLOMAX) 0.4 MG CAPS capsule TAKE 1 CAPSULE EVERY DAY 90 capsule 1  . lisinopril (ZESTRIL) 5 MG tablet Take 2 tablets (10 mg total) by mouth daily. 180 tablet 3  . simvastatin (ZOCOR) 20 MG tablet Take 1 tablet (20 mg total) by mouth every other day. 90 tablet 1   Current Facility-Administered Medications  Medication Dose Route Frequency Provider Last Rate Last Admin  . 0.9 %  sodium chloride infusion  500 mL Intravenous Once Ladene Artist, MD        Social History   Socioeconomic History  . Marital status: Married    Spouse name: Not on file  . Number of children: 2  . Years of education: Not on file  . Highest education level: Not on file  Occupational History  . Occupation: IT trainer company    Comment: retired  Tobacco Use  . Smoking status: Former Research scientist (life sciences)  . Smokeless tobacco: Never Used  Vaping Use  . Vaping Use: Never used  Substance and Sexual Activity  . Alcohol use: Yes    Alcohol/week: 8.0 standard drinks    Types:  8 Glasses of wine per week    Comment: ocassionally   . Drug use: No  . Sexual activity: Not on file  Other Topics Concern  . Not on file  Social History Narrative   Lives with wife in two story house, but occupies main level mostly   Has 4 children, two biological, two stepchildren, 9 grandkids   One son local, supportive    Active playing golf with friends, goes to Physiological scientist 2 days/week at [employer]   Enjoys reading, soduku   Social Determinants of Health   Financial Resource Strain: Low Risk   . Difficulty of Paying Living Expenses: Not hard at all  Food Insecurity: No Food Insecurity  . Worried About Charity fundraiser in the Last Year: Never true  . Ran Out of Food in the Last Year: Never true  Transportation Needs: No Transportation Needs  . Lack of Transportation (Medical): No  . Lack of Transportation (Non-Medical): No  Physical Activity: Insufficiently Active  . Days of Exercise per Week: 2 days  . Minutes of Exercise per Session: 50 min  Stress: No Stress Concern Present  . Feeling of Stress : Not at all  Social Connections: Socially Integrated  . Frequency of Communication with Friends and Family: More than three times a week  . Frequency of Social Gatherings with Friends and Family: More than three times a week  . Attends Religious Services: More than 4 times per year  . Active Member of Clubs or Organizations: Yes  . Attends Archivist Meetings: Never  . Marital Status: Married  Human resources officer Violence: Not At Risk  . Fear of Current or Ex-Partner: No  . Emotionally Abused: No  . Physically  Abused: No  . Sexually Abused: No    Family History  Problem Relation Age of Onset  . Diabetes Mother   . Cancer Father        liver ca  . Liver cancer Father   . Hypotension Sister   . CAD Brother   . Stomach cancer Paternal Aunt      ROS General: Negative; No fevers, chills, or night sweats HEENT: Negative; No changes in vision or  hearing, sinus congestion, difficulty swallowing Pulmonary: Negative; No cough, wheezing, shortness of breath, hemoptysis Cardiovascular: History of atrial flutter GI: Negative; No nausea, vomiting, diarrhea, or abdominal pain GU: Negative; No dysuria, hematuria, or difficulty voiding Musculoskeletal: Negative; no myalgias, joint pain, or weakness Hematologic: Negative; no easy bruising, bleeding Endocrine: Negative; no heat/cold intolerance Neuro: Negative; no changes in balance, headaches Skin: Negative; No rashes or skin lesions Psychiatric: Negative; No behavioral problems, depression Sleep: See history of present illness   Physical Exam BP (!) 158/78   Pulse (!) 59   Ht 6' 3"  (1.905 m)   Wt 250 lb (113.4 kg)   BMI 31.25 kg/m    Repeat blood pressure by me was 160/80 and on repeat 148/80  Wt Readings from Last 3 Encounters:  09/16/20 250 lb (113.4 kg)  07/26/20 246 lb 1.6 oz (111.6 kg)  03/30/20 257 lb 12.8 oz (116.9 kg)   General: Alert, oriented, no distress.  Skin: normal turgor, no rashes, warm and dry HEENT: Normocephalic, atraumatic. Pupils equal round and reactive to light; sclera anicteric; extraocular muscles intact;  Nose without nasal septal hypertrophy Mouth/Parynx benign; Mallinpatti scale 3 Neck: No JVD, no carotid bruits; normal carotid upstroke Lungs: clear to ausculatation and percussion; no wheezing or rales Chest wall: without tenderness to palpitation Heart: PMI not displaced, RRR, s1 s2 normal, 1/6 systolic murmur, no diastolic murmur, no rubs, gallops, thrills, or heaves Abdomen: soft, nontender; no hepatosplenomehaly, BS+; abdominal aorta nontender and not dilated by palpation. Back: no CVA tenderness Pulses 2+ Musculoskeletal: full range of motion, normal strength, no joint deformities Extremities: no clubbing cyanosis or edema, Homan's sign negative  Neurologic: grossly nonfocal; Cranial nerves grossly wnl Psychologic: Normal mood and  affect   ECG (independently read by me): Atrial flutter at 59, variable block, predominantly 4:1  May 13, 2019 ECG (independently read by me): Atrial flutter with variable block at 56  November 2019 ECG (independently read by me): Atrial flutter at 56 bpm with variable block.  October 2018 ECG (independently read by me): Atrial flutter with a ventricular rate at 70 bpm.  Nonspecific ST changes.  QTc interval 4o6 ms.  LABS:  BMP Latest Ref Rng & Units 07/26/2020 03/08/2020 01/26/2020  Glucose 70 - 99 mg/dL 112(H) 102(H) 118(H)  BUN 6 - 23 mg/dL 34(H) 32(H) 44(H)  Creatinine 0.40 - 1.50 mg/dL 1.20 1.14(H) 1.64(H)  BUN/Creat Ratio 6 - 22 (calc) - 28(H) 27(H)  Sodium 135 - 145 mEq/L 142 142 138  Potassium 3.5 - 5.1 mEq/L 4.7 5.1 4.2  Chloride 96 - 112 mEq/L 111 108 105  CO2 19 - 32 mEq/L 24 27 25   Calcium 8.4 - 10.5 mg/dL 8.9 9.2 8.1(L)     Hepatic Function Latest Ref Rng & Units 07/26/2020 03/08/2020 01/26/2020  Total Protein 6.0 - 8.3 g/dL 6.7 6.4 6.2  Albumin 3.5 - 5.2 g/dL 4.2 - -  AST 0 - 37 U/L 15 14 19   ALT 0 - 53 U/L 18 15 23   Alk Phosphatase 39 - 117 U/L  70 - -  Total Bilirubin 0.2 - 1.2 mg/dL 1.0 0.6 1.4(H)  Bilirubin, Direct 0.0 - 0.3 mg/dL - - -     CBC Latest Ref Rng & Units 07/26/2020 01/26/2020 07/14/2019  WBC 4.0 - 10.5 K/uL 6.8 7.5 5.1  Hemoglobin 13.0 - 17.0 g/dL 15.6 14.0 14.9  Hematocrit 39.0 - 52.0 % 46.2 41.5 44.2  Platelets 150.0 - 400.0 K/uL 136.0(L) 134(L) 149.0(L)     Lipid Panel     Component Value Date/Time   CHOL 150 07/26/2020 0948   TRIG 89.0 07/26/2020 0948   HDL 39.20 07/26/2020 0948   CHOLHDL 4 07/26/2020 0948   VLDL 17.8 07/26/2020 0948   LDLCALC 93 07/26/2020 0948   LDLCALC 83 01/26/2020 0913   LDLDIRECT 143.5 01/22/2012 0916     RADIOLOGY: No results found.   IMPRESSION:  1. OSA (obstructive sleep apnea)   2. Essential hypertension   3. Typical atrial flutter ([place])   4. Long term current use of anticoagulant   5.  Hyperlipidemia LDL goal <100     ASSESSMENT AND PLAN: Mr. Seppala is a 85 year-old gentleman who is a history of atrial flutter and is status post cardioversion.  He developed recurrent flutter with spontaneous rate control and has remained asymptomatic followed by Dr. Sallyanne Kuster [place].  He continues to be on anticoagulation with apixaban.  He has been diagnosed with apnea in 2016 at which time he received a ResMed air sense 10 auto CPAP unit, currently set at 9 cm water pressure.  Over the last several years he is consistently been compliant with CPAP use.  Recently, in April transmission is now only capable for 5G devices.  His device is 3G and therefore we are unable to obtain wireless data and make setting changes as we had previously.  However, he continues to be exceptionally compliant on his most recent download from March 1 through August 08, 2020.  AHI is excellent at 0.5 at 9 cm water pressure.  With his machine over 28 years old, he qualifies for a new machine and we will try to arrange for him to have a new ResMed air sense 11 CPAP unit.  Unfortunately due to supply chain issues there is significant backorder and it may take 3 to 6 months for him to receive a new machine.  Presently he feels well.  He is unaware of breakthrough snoring.  There is no residual daytime sleepiness and an Epworth Sleepiness Scale was calculated in the office today endorsing at 5.  However, blood pressure today is elevated and he states his blood pressure at home is as well.  He has only been on lisinopril at 7.5 mg and I have suggested he titrate this to 10 mg daily.  He will continue his current dose of Eliquis.  He is on simvastatin 20 mg for hyperlipidemia.  He will follow-up with Dr. Sallyanne Kuster.  I will see him 3 months of receiving his new CPAP machine or in 1 year, whichever comes first.   Troy Sine, MD, [facility]  09/23/2020 7:24 PM

## 2020-09-23 ENCOUNTER — Encounter: Payer: Self-pay | Admitting: Cardiovascular Disease

## 2020-09-27 NOTE — Patient Instructions (Signed)
Visit Information  Goals Addressed   None    Patient Care Plan: CCM Pharmacy Care Plan    Problem Identified: Problem: Hypertension, Hyperlipidemia, Atrial Fibrillation, BPH, Gout, Allergic Rhinitis and Rosacea     Long-Range Goal: Patient-Specific Goal   Start Date: 09/08/2020  Expected End Date: 09/08/2021  This Visit's Progress: On track  Priority: High  Note:   Current Barriers:  . Unable to independently monitor therapeutic efficacy  Pharmacist Clinical Goal(s):  [name] Patient will achieve adherence to monitoring guidelines and medication adherence to achieve therapeutic efficacy through collaboration with PharmD and provider.   Interventions: . 1:1 collaboration with Caren Macadam, MD regarding development and update of comprehensive plan of care as evidenced by provider attestation and co-signature . Inter-disciplinary care team collaboration (see longitudinal plan of care) . Comprehensive medication review performed; medication list updated in electronic medical record  Hypertension (BP goal <140/90) -Controlled -Current treatment:  Lisinopril 5 mg 1.5 tablets daily -Medications previously tried:  -Current home readings: 145/80, 145/75, 140/75 (last 30 days), 135/76 - tries to do every day (5-6 weeks) -Current dietary habits: limiting added salt and switching out for a salt substitute if possible -Current exercise habits: works out with a trainer twice a week; working with the trainer -Denies hypotensive/hypertensive symptoms other than orthostatic hypotension -Educated on Exercise goal of 150 minutes per week; Importance of home blood pressure monitoring; Proper BP monitoring technique; -Counseled to monitor BP at home a few times a week, document, and provide log at future appointments -Counseled on diet and exercise extensively Recommended to continue current medication Plan to repeat blood pressure assessment in a few weeks  Hyperlipidemia: (LDL goal <  70) -Controlled -Current treatment: . Simvastatin 20 mg 1 tablet every other day  -Medications previously tried: none  -Current dietary patterns: eats out a few days a week -Current exercise habits: working out with a Physiological scientist -Educated on Cholesterol goals;  Benefits of statin for ASCVD risk reduction; Importance of limiting foods high in cholesterol; Exercise goal of 150 minutes per week; -Counseled on diet and exercise extensively Recommended to continue current medication Recommended getting an updated prescription for every other day.  Atrial Flutter (Goal: prevent stroke and major bleeding) -Controlled -CHADSVASC: 3 -Current treatment: . Rate control: none . Anticoagulation: Eliquis 5 mg 1 tablet twice daily -Medications previously tried: none -Home BP and HR readings: previously reported  -Counseled on increased risk of stroke due to Afib and benefits of anticoagulation for stroke prevention; avoidance of NSAIDs due to increased bleeding risk with anticoagulants; -Recommended to continue current medication  BPH (Goal: minimize symptoms of enlarged prostate) -Controlled -Current treatment  . tamsulosin 0.4 mg 1 capsule daily -Medications previously tried: none  -Recommended to continue current medication  Elevated uric acid (Goal: uric acid < 6) -Uncontrolled -Current treatment  . Allopurinol 100 mg 1 tablet daily -Medications previously tried: none  -Educated on limiting alcohol intake and avoiding foods that can cause gout flare ups (shellfish, organ meats, soda, etc.); patient currently drinks 4-5 glasses of wine per week  Allergic rhinitis (Goal: minimize symptoms) -Controlled -Current treatment  . Fluticasone 50 mcg/act 1 spray in nostril every other day -Medications previously tried: none  -Recommended to continue current medication Educated on avoidance of allergy triggers  Rosacea (Goal: minimize symptoms) -Controlled -Current treatment   . Minocycline 100 mg 1 capsule every other day -Medications previously tried: none  -Recommended to continue current medication   Health Maintenance -Vaccine gaps: COVID booster -Current therapy:  CoQ10 30 mg 1 capsule every other day - taking same day as simvastatin   Multivitamin 1 tablet every day -Educated on Cost vs benefit of each product must be carefully weighed by individual consumer -Patient is satisfied with current therapy and denies issues -Recommended to continue current medication  Patient Goals/Self-Care Activities . Patient will:  - check blood pressure a few times a week, document, and provide at future appointments target a minimum of 150 minutes of moderate intensity exercise weekly  Follow Up Plan: Telephone follow up appointment with care management team member scheduled for: 6 months (sooner depending on BP)       Patient verbalizes understanding of instructions provided today and agrees to view in [place].  The pharmacy team will reach out to the patient again over the next 30 days.   Viona Gilmore, [facility]

## 2020-10-01 ENCOUNTER — Telehealth: Payer: Self-pay | Admitting: Pharmacist

## 2020-10-01 NOTE — Chronic Care Management (AMB) (Signed)
Chronic Care Management Pharmacy Assistant   Name: Gary [name]  MRN: [id number] DOB: 1935-07-01  Reason for Encounter: Disease State/ Hypertension Assessment Call.    Conditions to be addressed/monitored: HTN  Recent office visits:  None.   Recent consult visits:  09/16/20 Shelva Majestic MD (Cardiology) - seen for OSA and other chronic conditions. Increased losartan to 10mg  daily instead of 7.5 daily. Follow up 3 months after new CPAP or 1 year whichever comes first.   Hospital visits:  None in previous 6 months  Medications: Outpatient Encounter Medications as of 10/01/2020  Medication Sig  . allopurinol (ZYLOPRIM) 100 MG tablet TAKE 1 TABLET(100 MG) BY MOUTH DAILY  . co-enzyme Q-10 30 MG capsule Take 30 mg by mouth every other day.   [name] ELIQUIS 5 MG TABS tablet TAKE 1 TABLET TWICE DAILY  . fluticasone (FLONASE) 50 MCG/ACT nasal spray Place 1 spray into both nostrils every other day.  . lisinopril (ZESTRIL) 5 MG tablet Take 2 tablets (10 mg total) by mouth daily.  . minocycline (MINOCIN,DYNACIN) 100 MG capsule Take 100 mg by mouth every other day.   . Multiple Vitamin (MULTIVITAMIN) tablet Take 1 tablet by mouth daily.  . simvastatin (ZOCOR) 20 MG tablet Take 1 tablet (20 mg total) by mouth every other day.  . tamsulosin (FLOMAX) 0.4 MG CAPS capsule TAKE 1 CAPSULE EVERY DAY   Facility-Administered Encounter Medications as of 10/01/2020  Medication  . 0.9 %  sodium chloride infusion    Reviewed chart prior to disease state call. Spoke with patient regarding BP  Recent Office Vitals: BP Readings from Last 3 Encounters:  09/16/20 (!) 158/78  07/26/20 102/68  03/30/20 (!) 152/78   Pulse Readings from Last 3 Encounters:  09/16/20 (!) 59  07/26/20 (!) 111  03/30/20 (!) 59    Wt Readings from Last 3 Encounters:  09/16/20 250 lb (113.4 kg)  07/26/20 246 lb 1.6 oz (111.6 kg)  03/30/20 257 lb 12.8 oz (116.9 kg)     Kidney Function Lab Results  Component Value  Date/Time   CREATININE 1.20 07/26/2020 09:48 AM   CREATININE 1.14 (H) 03/08/2020 09:57 AM   CREATININE 1.64 (H) 01/26/2020 09:13 AM   GFR 56.38 (L) 07/26/2020 09:48 AM   GFRNONAA 60 (L) 08/16/2017 09:36 AM   GFRAA >60 08/16/2017 09:36 AM    BMP Latest Ref Rng & Units 07/26/2020 03/08/2020 01/26/2020  Glucose 70 - 99 mg/dL 112(H) 102(H) 118(H)  BUN 6 - 23 mg/dL 34(H) 32(H) 44(H)  Creatinine 0.40 - 1.50 mg/dL 1.20 1.14(H) 1.64(H)  BUN/Creat Ratio 6 - 22 (calc) - 28(H) 27(H)  Sodium 135 - 145 mEq/L 142 142 138  Potassium 3.5 - 5.1 mEq/L 4.7 5.1 4.2  Chloride 96 - 112 mEq/L 111 108 105  CO2 19 - 32 mEq/L 24 27 25   Calcium 8.4 - 10.5 mg/dL 8.9 9.2 8.1(L)    . Current antihypertensive regimen:   Lisinopril 5 mg 2 tablets daily total of 10mg .   . How often are you checking your Blood Pressure? daily  . Current home BP readings: 09/30/20 was 156/83. Other than that reading patient stated that it has been running 782-956 range systolically and 21-30 range diastolically.   . What recent interventions/DTPs have been made by any provider to improve Blood Pressure control since last CPP Visit: increased losartan to 10mg  daily by Dr. Claiborne Billings cardiologist.   . Any recent hospitalizations or ED visits since last visit with CPP? No  . What  diet changes have been made to improve Blood Pressure Control?  o Patient stated him and his wife eat out quite a bit. Patient stated that for breakfast he tends to usually have eggs and couple times a week he has sausage or bacon with his breakfast. Patient prefers chicken and fish over red neat. Patient stated that for lunch he typically has a [place] sandwich and some chips or something lite especially with it hotter now. Patient stated for dinner he has chicken or fish with a vegetable and said typically. Patient drinks plenty of water. . What exercise is being done to improve your Blood Pressure Control?  o Patient was working in his 2 and a half acre yard when I  called. Patient stated he gets in plenty of walking around his yard especially with the weather nicer. Patient states he goes and works out with a trainer twice a week for at least 45 minutes working with [employer] and core training. Patient states he's able to do stuff around home and be active with no issues at this time.   Adherence Review: Is the patient currently on ACE/ARB medication? Yes Does the patient have >5 day gap between last estimated fill dates? Yes  Notes:  Spoke with patient reviewed all medications as listed. Patient reports as taking all medications but he is taking his flonase and flomax every other day instead of everyday and his losartan was increased to 10mg  daily instead of 7.5 mg daily Dr. Claiborne Billings. Patient is also suppose to be getting a new CPAP machine. . Patient reports no issues with his medications at this time. Patient reports taking his blood pressure daily but could not give but his readings specifically from yesterday due to being out in his yard away from the house. Patient states he eats pretty healthy and stays active. Patient stated that his wife also makes him food at home and she uses low sodium most of the time. Patient thanked me for my call.   Star Rating Drugs:   Lisinopril 5 mg - last filled on 03/15/20 90DS at [facility]  Simvastatin 20 mg - last filled on 12/02/19 90DS at [facility] Pharmacist Assistant [phone]

## 2020-10-11 DIAGNOSIS — H4323 Crystalline deposits in vitreous body, bilateral: Secondary | ICD-10-CM | POA: Diagnosis not present

## 2020-10-11 DIAGNOSIS — H34832 Tributary (branch) retinal vein occlusion, left eye, with macular edema: Secondary | ICD-10-CM | POA: Diagnosis not present

## 2020-10-11 DIAGNOSIS — H43813 Vitreous degeneration, bilateral: Secondary | ICD-10-CM | POA: Diagnosis not present

## 2020-10-11 DIAGNOSIS — H35361 Drusen (degenerative) of macula, right eye: Secondary | ICD-10-CM | POA: Diagnosis not present

## 2020-10-19 DIAGNOSIS — Z85828 Personal history of other malignant neoplasm of skin: Secondary | ICD-10-CM | POA: Diagnosis not present

## 2020-10-19 DIAGNOSIS — D1801 Hemangioma of skin and subcutaneous tissue: Secondary | ICD-10-CM | POA: Diagnosis not present

## 2020-10-19 DIAGNOSIS — L812 Freckles: Secondary | ICD-10-CM | POA: Diagnosis not present

## 2020-10-19 DIAGNOSIS — L570 Actinic keratosis: Secondary | ICD-10-CM | POA: Diagnosis not present

## 2020-10-19 DIAGNOSIS — L720 Epidermal cyst: Secondary | ICD-10-CM | POA: Diagnosis not present

## 2020-10-19 DIAGNOSIS — L821 Other seborrheic keratosis: Secondary | ICD-10-CM | POA: Diagnosis not present

## 2020-10-19 DIAGNOSIS — L820 Inflamed seborrheic keratosis: Secondary | ICD-10-CM | POA: Diagnosis not present

## 2020-10-27 DIAGNOSIS — H34832 Tributary (branch) retinal vein occlusion, left eye, with macular edema: Secondary | ICD-10-CM | POA: Diagnosis not present

## 2020-11-24 DIAGNOSIS — H35363 Drusen (degenerative) of macula, bilateral: Secondary | ICD-10-CM | POA: Diagnosis not present

## 2020-11-24 DIAGNOSIS — H26493 Other secondary cataract, bilateral: Secondary | ICD-10-CM | POA: Diagnosis not present

## 2020-11-24 DIAGNOSIS — H26491 Other secondary cataract, right eye: Secondary | ICD-10-CM | POA: Diagnosis not present

## 2020-11-24 DIAGNOSIS — Z961 Presence of intraocular lens: Secondary | ICD-10-CM | POA: Diagnosis not present

## 2020-11-24 DIAGNOSIS — H348322 Tributary (branch) retinal vein occlusion, left eye, stable: Secondary | ICD-10-CM | POA: Diagnosis not present

## 2020-11-24 DIAGNOSIS — H18593 Other hereditary corneal dystrophies, bilateral: Secondary | ICD-10-CM | POA: Diagnosis not present

## 2020-12-07 ENCOUNTER — Ambulatory Visit: Payer: Medicare HMO | Admitting: Cardiovascular Disease

## 2020-12-15 ENCOUNTER — Telehealth: Payer: Self-pay | Admitting: Pharmacist

## 2020-12-15 DIAGNOSIS — H26492 Other secondary cataract, left eye: Secondary | ICD-10-CM | POA: Diagnosis not present

## 2020-12-15 NOTE — Chronic Care Management (AMB) (Signed)
Chronic Care Management Pharmacy Assistant   Name: Gary [name]  MRN: [id number] DOB: 10-01-1935   Reason for Encounter: Disease State/ Hypertension Assessment Call.   Conditions to be addressed/monitored: HTN   Recent office visits:  None.  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 Received aflibercept injection of eye in office. No medication changes or follow up noted.   10/19/20 Jarome Matin (Dermatology) - seen for history of malignant neoplasm and other skin related issues. No medication changes or follow up noted.   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 No medication changes or follow up noted.   Hospital visits:  None in previous 6 months  Medications: Outpatient Encounter Medications as of 12/15/2020  Medication Sig   allopurinol (ZYLOPRIM) 100 MG tablet TAKE 1 TABLET(100 MG) BY MOUTH DAILY   co-enzyme Q-10 30 MG capsule Take 30 mg by mouth every other day.    ELIQUIS 5 MG TABS tablet TAKE 1 TABLET TWICE DAILY   fluticasone (FLONASE) 50 MCG/ACT nasal spray Place 1 spray into both nostrils every other day.   lisinopril (ZESTRIL) 5 MG tablet Take 2 tablets (10 mg total) by mouth daily.   minocycline (MINOCIN,DYNACIN) 100 MG capsule Take 100 mg by mouth every other day.    Multiple Vitamin (MULTIVITAMIN) tablet Take 1 tablet by mouth daily.   simvastatin (ZOCOR) 20 MG tablet Take 1 tablet (20 mg total) by mouth every other day.   tamsulosin (FLOMAX) 0.4 MG CAPS capsule TAKE 1 CAPSULE EVERY DAY   Facility-Administered Encounter Medications as of 12/15/2020  Medication   0.9 %  sodium chloride infusion   Fill History: allopurinol (ZYLOPRIM) tablet 10/22/2020 90   ELIQUIS 5 MG PO TABS 09/01/2020 90   fluticasone (FLONASE) nasal spray 09/20/2020 90   lisinopril (PRINIVIL,ZESTRIL)  tablet 10/10/2020 90   simvastatin (ZOCOR) tablet 09/17/2020 90   tamsulosin (FLOMAX) capsule 0.4 mg 07/21/2020 90   Reviewed chart prior to disease state call. Spoke with patient regarding BP  Recent Office Vitals: BP Readings from Last 3 Encounters:  09/16/20 (!) 158/78  07/26/20 102/68  03/30/20 (!) 152/78   Pulse Readings from Last 3 Encounters:  09/16/20 (!) 59  07/26/20 (!) 111  03/30/20 (!) 59    Wt Readings from Last 3 Encounters:  09/16/20 250 lb (113.4 kg)  07/26/20 246 lb 1.6 oz (111.6 kg)  03/30/20 257 lb 12.8 oz (116.9 kg)     Kidney Function Lab Results  Component Value Date/Time   CREATININE 1.20 07/26/2020 09:48 AM   CREATININE 1.14 (H) 03/08/2020 09:57 AM   CREATININE 1.64 (H) 01/26/2020 09:13 AM   GFR 56.38 (L) 07/26/2020 09:48 AM   GFRNONAA 60 (L) 08/16/2017 09:36 AM   GFRAA >60 08/16/2017 09:36 AM    BMP Latest Ref Rng & Units 07/26/2020 03/08/2020 01/26/2020  Glucose 70 - 99 mg/dL 112(H) 102(H) 118(H)  BUN 6 - 23 mg/dL 34(H) 32(H) 44(H)  Creatinine 0.40 - 1.50 mg/dL 1.20 1.14(H) 1.64(H)  BUN/Creat Ratio 6 - 22 (calc) - 28(H) 27(H)  Sodium 135 - 145 mEq/L 142 142 138  Potassium 3.5 - 5.1 mEq/L 4.7 5.1 4.2  Chloride 96 - 112 mEq/L 111 108 105  CO2 19 - 32 mEq/L '24 27 25  '$ Calcium 8.4 - 10.5 mg/dL 8.9 9.2 8.1(L)    Current antihypertensive regimen:  Lisinopril  $'5mg'o$  - take 2 tablets by mouth daily. How often are you checking your Blood Pressure? 3-5x per week Current home BP readings: patient states recent readings have been around 120s-130s over 60s-70s. It occasionally jumps to high 140s over 80s. What recent interventions/DTPs have been made by any provider to improve Blood Pressure control since last CPP Visit: None. Any recent hospitalizations or ED visits since last visit with CPP? No  Adherence Review: Is the patient currently on ACE/ARB medication? Yes Does the patient have >5 day gap between last estimated fill dates? No  Notes: Spoke  with Claiborne Billings at [facility] who verified that patient has no fill history on below medications with them. Spoke with Ashlynn at [facility] who verified patient had lisinopril filled with them 10/11/20 for a 30 day supply and her simvastatin on 12/03/20 for a 90 day supply.  Spoke with patient and reviewed all medications as prescribed. Patient reports taking all medications listed and no issues at this time. Patient stated he tends to have eggs, toast and a cup of coffee for breakfast and he does have some bacon on the weekends. Patient has fresh fruit, tuna salad or some peanut butter crackers for lunch. For dinner patient stated he doe sgo out a couple times a week but he has chicken, fish or pork for a meat with a vegetable or two. Patient does not add salt to his food and he drinks 3 bottles of water everyday. Patient currently plays gold once a week. He works out with a Clinical research associate two times a week and he does yard work for activity.   Care Gaps:  AWV- completed 06/04/20 Covid-19 vaccine - overdue since 11/26/19 Flu vaccine - due   Star Rating Drugs:  Lisinopril '5mg'$  - last filled on 10/10/20 90DS  Simvastatin '20mg'$  - last filled on 09/17/20 [place]  Clinical Pharmacist Assistant [phone]

## 2020-12-20 NOTE — Telephone Encounter (Cosign Needed)
2nd attempt

## 2020-12-21 NOTE — Telephone Encounter (Cosign Needed)
3rd attempt

## 2020-12-23 DIAGNOSIS — H26492 Other secondary cataract, left eye: Secondary | ICD-10-CM | POA: Diagnosis not present

## 2021-01-03 ENCOUNTER — Other Ambulatory Visit: Payer: Self-pay | Admitting: Family Medicine

## 2021-01-17 ENCOUNTER — Other Ambulatory Visit: Payer: Self-pay | Admitting: Cardiovascular Disease

## 2021-01-17 NOTE — Telephone Encounter (Signed)
Prescription refill request for Eliquis received. Last office visit:croitoru 03/30/20 Scr:1.2 07/26/20 Age: 21mWeight:111.6kg

## 2021-01-21 ENCOUNTER — Telehealth: Payer: Self-pay | Admitting: Cardiovascular Disease

## 2021-01-21 NOTE — Telephone Encounter (Signed)
STAT if patient feels like he/she is going to faint   Are you dizzy now? no  Do you feel faint or have you passed out? Yes, while he was playing golf  Do you have any other symptoms? Lightheadness   Have you checked your HR and BP (record if available)? Yes, he states its fine. Please advise

## 2021-01-21 NOTE — Telephone Encounter (Signed)
Returned call to pt he states that Wednesday when he was playing golf he states that he became dizzy, fatigued and light headed so much so that he had to stop at the 6th hole and he rested "about 5 minutes" and this went away and has not happened since. He states that he has asymptomatic AFIB but states that this was different. He is compliant with his Eliquis. His BP/HR when he got home was 129-69 HR 50-60's. He and similar symptoms before this happened (fatigue and dizziness) when he was doing yardwork. He states that he just rests a short period and it goes away then he will continue to do yardwork. And "for several years" he gets dizzy when he changes positions. He states that he "stays well hydrated". He will in the future make sure that he is well hydrated the day before and the morning of when playing golf next time. He has an appointment next week for his annual with PCP. He will ask for EKG at that visit. He will call if this happens again.

## 2021-01-24 NOTE — Telephone Encounter (Signed)
Patient made aware and will call back if anything further is needed.

## 2021-01-26 ENCOUNTER — Telehealth: Payer: Self-pay | Admitting: Pharmacist

## 2021-01-26 NOTE — Chronic Care Management (AMB) (Signed)
    Chronic Care Management Pharmacy Assistant   Name: Gary [name]  MRN: [id number] DOB: [birth year]  Spoke with patient and rescheduled patients 03/10/21 appointment to 05/24/21. Patient aware and agreeable to new appointment date and time. Patient thanked me for my call.   Medications: Outpatient Encounter Medications as of 01/26/2021  Medication Sig   allopurinol (ZYLOPRIM) 100 MG tablet TAKE 1 TABLET EVERY DAY   co-enzyme Q-10 30 MG capsule Take 30 mg by mouth every other day.    ELIQUIS 5 MG TABS tablet TAKE 1 TABLET TWICE DAILY   fluticasone (FLONASE) 50 MCG/ACT nasal spray Place 1 spray into both nostrils every other day.   lisinopril (ZESTRIL) 5 MG tablet Take 2 tablets (10 mg total) by mouth daily.   minocycline (MINOCIN,DYNACIN) 100 MG capsule Take 100 mg by mouth every other day.    Multiple Vitamin (MULTIVITAMIN) tablet Take 1 tablet by mouth daily.   simvastatin (ZOCOR) 20 MG tablet Take 1 tablet (20 mg total) by mouth every other day.   tamsulosin (FLOMAX) 0.4 MG CAPS capsule TAKE 1 CAPSULE EVERY DAY   Facility-Administered Encounter Medications as of 01/26/2021  Medication   0.9 %  sodium chloride infusion    Care Gaps:  AWV - completed on 06/04/20 Covid-19 vaccine booster 3 - overdue since 11/26/19 Flu vaccine - due Hemoglobin A1C - due Last PCP BP - 102/68 P:111 on 07/26/20  Star Rating Drugs:  Lisinopril 5mg  - last filled on 10/10/20 90DS  Simvastatin 20mg  - last filled on 12/01/20 90DS   [place]  Clinical Pharmacist Assistant [phone]

## 2021-01-28 ENCOUNTER — Telehealth: Payer: Self-pay | Admitting: Cardiovascular Disease

## 2021-01-28 NOTE — Telephone Encounter (Signed)
Patient calling the office for samples of medication:   1.  What medication and dosage are you requesting samples for? request for Eliquis  Last office visit:croitoru 03/30/20 Scr:1.2 07/26/20 Age: 41m Weight:111.6kg  2.  Are you currently out of this medication?  Mail order is on the way but he dont have enough to get though the weekend and would like to know if she could get a sample today

## 2021-02-02 ENCOUNTER — Other Ambulatory Visit: Payer: Self-pay

## 2021-02-02 ENCOUNTER — Ambulatory Visit (INDEPENDENT_AMBULATORY_CARE_PROVIDER_SITE_OTHER): Payer: Medicare HMO | Admitting: Family Medicine

## 2021-02-02 ENCOUNTER — Encounter: Payer: Self-pay | Admitting: Family Medicine

## 2021-02-02 VITALS — BP 124/70 | HR 61 | Temp 98.0°F | Ht 75.0 in | Wt 260.1 lb

## 2021-02-02 DIAGNOSIS — I483 Typical atrial flutter: Secondary | ICD-10-CM

## 2021-02-02 DIAGNOSIS — G4733 Obstructive sleep apnea (adult) (pediatric): Secondary | ICD-10-CM | POA: Diagnosis not present

## 2021-02-02 DIAGNOSIS — E790 Hyperuricemia without signs of inflammatory arthritis and tophaceous disease: Secondary | ICD-10-CM

## 2021-02-02 DIAGNOSIS — E7800 Pure hypercholesterolemia, unspecified: Secondary | ICD-10-CM

## 2021-02-02 DIAGNOSIS — E875 Hyperkalemia: Secondary | ICD-10-CM

## 2021-02-02 DIAGNOSIS — I10 Essential (primary) hypertension: Secondary | ICD-10-CM | POA: Diagnosis not present

## 2021-02-02 DIAGNOSIS — R7301 Impaired fasting glucose: Secondary | ICD-10-CM | POA: Diagnosis not present

## 2021-02-02 DIAGNOSIS — Z23 Encounter for immunization: Secondary | ICD-10-CM | POA: Diagnosis not present

## 2021-02-02 LAB — COMPREHENSIVE METABOLIC PANEL
ALT: 17 U/L (ref 0–53)
AST: 17 U/L (ref 0–37)
Albumin: 3.9 g/dL (ref 3.5–5.2)
Alkaline Phosphatase: 53 U/L (ref 39–117)
BUN: 32 mg/dL — ABNORMAL HIGH (ref 6–23)
CO2: 27 mEq/L (ref 19–32)
Calcium: 8.9 mg/dL (ref 8.4–10.5)
Chloride: 105 mEq/L (ref 96–112)
Creatinine, Ser: 1.27 mg/dL (ref 0.40–1.50)
GFR: 52.48 mL/min — ABNORMAL LOW (ref >60.00)
Glucose, Bld: 96 mg/dL (ref 70–99)
Potassium: 5.3 mEq/L — ABNORMAL HIGH (ref 3.5–5.1)
Sodium: 138 mEq/L (ref 135–145)
Total Bilirubin: 0.8 mg/dL (ref 0.2–1.2)
Total Protein: 6.2 g/dL (ref 6.0–8.3)

## 2021-02-02 LAB — CBC WITH DIFFERENTIAL/PLATELET
Basophils Absolute: 0 10*3/uL (ref 0.0–0.1)
Basophils Relative: 0.5 % (ref 0.0–3.0)
Eosinophils Absolute: 0.1 10*3/uL (ref 0.0–0.7)
Eosinophils Relative: 1.3 % (ref 0.0–5.0)
HCT: 43 % (ref 39.0–52.0)
Hemoglobin: 14.4 g/dL (ref 13.0–17.0)
Lymphocytes Relative: 17.2 % (ref 12.0–46.0)
Lymphs Abs: 0.9 10*3/uL (ref 0.7–4.0)
MCHC: 33.5 g/dL (ref 30.0–36.0)
MCV: 99.9 fl (ref 78.0–100.0)
Monocytes Absolute: 0.7 10*3/uL (ref 0.1–1.0)
Monocytes Relative: 13.5 % — ABNORMAL HIGH (ref 3.0–12.0)
Neutro Abs: 3.6 10*3/uL (ref 1.4–7.7)
Neutrophils Relative %: 67.5 % (ref 43.0–77.0)
Platelets: 142 10*3/uL — ABNORMAL LOW (ref 150.0–400.0)
RBC: 4.31 Mil/uL (ref 4.22–5.81)
RDW: 13.7 % (ref 11.5–15.5)
WBC: 5.4 10*3/uL (ref 4.0–10.5)

## 2021-02-02 LAB — LIPID PANEL
Cholesterol: 143 mg/dL (ref 0–200)
HDL: 41.2 mg/dL (ref >39.00)
LDL Cholesterol: 86 mg/dL (ref 0–99)
NonHDL: 102.21
Total CHOL/HDL Ratio: 3
Triglycerides: 81 mg/dL (ref 0.0–149.0)
VLDL: 16.2 mg/dL (ref 0.0–40.0)

## 2021-02-02 LAB — URIC ACID: Uric Acid, Serum: 7.7 mg/dL (ref 4.0–7.8)

## 2021-02-02 LAB — HEMOGLOBIN A1C: Hgb A1c MFr Bld: 6.3 % (ref 4.6–6.5)

## 2021-02-02 NOTE — Progress Notes (Signed)
Gary [name] DOB: 1935-12-02 Encounter date: 02/02/2021  This is a 85 y.o. male who presents with Chief Complaint  Patient presents with   Follow-up    History of present illness:  Doing ok. Has had some instances of light headedness. Mostly noting if active - playing golf, working in yard and weather is hot. Had to quit after 9 holes golf a few weeks ago. [place] at the beach and played golf 2 days in a row and did fine. When he had to stop golf - went inside, drank some fluids. Bp was 120's/60's. Not noting heart racing. His home cuff said 157/87. HR was 67. HR usually in 50-60's. Highest he has seen was 85. No issues with breathing. Would note when getting out of golf cart felt like balance was off.   Still working out with trainer, does fine with this.   Still using cpap regularly.   States that he earned weight gain.   If he slacks off on exercise then can feel more when getting back with it.   Had what he thinks is gout attack a couple of months ago. Foot hurt enough he had hard time walking on it. Took aleve and it felt better.   No Known Allergies Current Meds  Medication Sig   allopurinol (ZYLOPRIM) 100 MG tablet TAKE 1 TABLET EVERY DAY   co-enzyme Q-10 30 MG capsule Take 30 mg by mouth every other day.    ELIQUIS 5 MG TABS tablet TAKE 1 TABLET TWICE DAILY   fluticasone (FLONASE) 50 MCG/ACT nasal spray Place 1 spray into both nostrils every other day.   lisinopril (ZESTRIL) 5 MG tablet Take 2 tablets (10 mg total) by mouth daily.   minocycline (MINOCIN,DYNACIN) 100 MG capsule Take 100 mg by mouth every other day.    Multiple Vitamin (MULTIVITAMIN) tablet Take 1 tablet by mouth daily.   simvastatin (ZOCOR) 20 MG tablet Take 1 tablet (20 mg total) by mouth every other day.   tamsulosin (FLOMAX) 0.4 MG CAPS capsule TAKE 1 CAPSULE EVERY DAY   Current Facility-Administered Medications for the 02/02/21 encounter (Office Visit) with Caren Macadam, MD  Medication    0.9 %  sodium chloride infusion    Review of Systems  Constitutional:  Negative for chills, fatigue and fever.  Respiratory:  Negative for cough, chest tightness, shortness of breath and wheezing.   Cardiovascular:  Negative for chest pain, palpitations and leg swelling.  Neurological:  Positive for light-headedness. Negative for dizziness and headaches.   Objective:  BP 124/70   Pulse 61   Temp 98 F (36.7 C) (Oral)   Ht 6\' 3"  (1.905 m)   Wt 260 lb 1.6 oz (118 kg)   SpO2 98%   BMI 32.51 kg/m   Weight: 260 lb 1.6 oz (118 kg)   BP Readings from Last 3 Encounters:  02/02/21 124/70  09/16/20 (!) 158/78  07/26/20 102/68   Wt Readings from Last 3 Encounters:  02/02/21 260 lb 1.6 oz (118 kg)  09/16/20 250 lb (113.4 kg)  07/26/20 246 lb 1.6 oz (111.6 kg)    Physical Exam Constitutional:      General: He is not in acute distress.    Appearance: He is well-developed.  Cardiovascular:     Rate and Rhythm: Normal rate. Rhythm irregularly irregular. No extrasystoles are present.    Heart sounds: Normal heart sounds. No murmur heard.   No friction rub.     Comments: My recheck bp 138/64 sitting,  124/70 standing. Home cuff 146/84 sitting. HR 68 standing.  Pulmonary:     Effort: Pulmonary effort is normal. No respiratory distress.     Breath sounds: Normal breath sounds. No wheezing or rales.  Musculoskeletal:     Right lower leg: 1+ Pitting Edema present.     Left lower leg: 1+ Pitting Edema present.  Neurological:     Mental Status: He is alert and oriented to person, place, and time.  Psychiatric:        Behavior: Behavior normal.    Assessment/Plan  1. Typical atrial flutter ([place]) Rate controlled; follows with cardiology. Reviewed most recent cardiology note. Rhythm, heart rate seem stable. Not orthostatic. Appropriate HR with position changes. Discussed making sure to stay well hydrated, esp when outside. Make sure electrolytes replaced with golf, yard work. Episodes of  light headedness only in heat and not with other activity levels.   2. Essential hypertension Well controlled - monitor at home - watch for low pressures. Monitor heart rate at home.  - CBC with Differential/Platelet; Future - Comprehensive metabolic panel; Future  3. Obstructive sleep apnea Awaiting new machine.   4. Impaired fasting glucose - Hemoglobin A1c; Future  5. Hypercholesterolemia On zocor 20mg  daily - Comprehensive metabolic panel; Future - Lipid panel; Future  6. Elevated uric acid in blood Had one episode he thinks was gout; has been taking allopurinol regularly. Recheck levels today. - Uric acid; Future   Return in about 6 months (around 08/03/2021) for physical exam.  41 minutes spent in chart review, time with patient, charting, exam.    [name], MD

## 2021-02-07 MED ORDER — ALLOPURINOL 100 MG PO TABS: 200.0000 mg | ORAL_TABLET | Freq: Every day | ORAL | 0 refills | Status: DC

## 2021-02-07 NOTE — Addendum Note (Signed)
Addended by: Agnes Lawrence on: 02/07/2021 02:23 PM   Modules accepted: Orders

## 2021-02-14 ENCOUNTER — Other Ambulatory Visit: Payer: Self-pay

## 2021-02-14 ENCOUNTER — Other Ambulatory Visit: Payer: Medicare HMO

## 2021-02-14 DIAGNOSIS — E875 Hyperkalemia: Secondary | ICD-10-CM

## 2021-02-14 LAB — BASIC METABOLIC PANEL
BUN: 33 mg/dL — ABNORMAL HIGH (ref 6–23)
CO2: 26 mEq/L (ref 19–32)
Calcium: 8.6 mg/dL (ref 8.4–10.5)
Chloride: 108 mEq/L (ref 96–112)
Creatinine, Ser: 1.25 mg/dL (ref 0.40–1.50)
GFR: 53.47 mL/min — ABNORMAL LOW (ref >60.00)
Glucose, Bld: 108 mg/dL — ABNORMAL HIGH (ref 70–99)
Potassium: 4.8 mEq/L (ref 3.5–5.1)
Sodium: 139 mEq/L (ref 135–145)

## 2021-02-27 ENCOUNTER — Other Ambulatory Visit: Payer: Self-pay

## 2021-02-27 ENCOUNTER — Emergency Department (HOSPITAL_BASED_OUTPATIENT_CLINIC_OR_DEPARTMENT_OTHER)
Admission: EM | Admit: 2021-02-27 | Discharge: 2021-02-27 | Disposition: A | Discharge status: Home / Self Care | Payer: Medicare HMO | Attending: Emergency Medicine | Admitting: Emergency Medicine

## 2021-02-27 ENCOUNTER — Emergency Department (HOSPITAL_BASED_OUTPATIENT_CLINIC_OR_DEPARTMENT_OTHER): Payer: Medicare HMO

## 2021-02-27 ENCOUNTER — Encounter (HOSPITAL_BASED_OUTPATIENT_CLINIC_OR_DEPARTMENT_OTHER): Payer: Self-pay | Admitting: Emergency Medicine

## 2021-02-27 DIAGNOSIS — J9811 Atelectasis: Secondary | ICD-10-CM | POA: Diagnosis not present

## 2021-02-27 DIAGNOSIS — I4892 Unspecified atrial flutter: Secondary | ICD-10-CM | POA: Insufficient documentation

## 2021-02-27 DIAGNOSIS — Z85828 Personal history of other malignant neoplasm of skin: Secondary | ICD-10-CM | POA: Insufficient documentation

## 2021-02-27 DIAGNOSIS — R002 Palpitations: Secondary | ICD-10-CM | POA: Diagnosis present

## 2021-02-27 DIAGNOSIS — I10 Essential (primary) hypertension: Secondary | ICD-10-CM | POA: Diagnosis not present

## 2021-02-27 DIAGNOSIS — Z87891 Personal history of nicotine dependence: Secondary | ICD-10-CM | POA: Insufficient documentation

## 2021-02-27 DIAGNOSIS — R079 Chest pain, unspecified: Secondary | ICD-10-CM | POA: Diagnosis not present

## 2021-02-27 DIAGNOSIS — Z79899 Other long term (current) drug therapy: Secondary | ICD-10-CM | POA: Insufficient documentation

## 2021-02-27 DIAGNOSIS — Z96651 Presence of right artificial knee joint: Secondary | ICD-10-CM | POA: Diagnosis not present

## 2021-02-27 LAB — CBC WITH DIFFERENTIAL/PLATELET
Abs Immature Granulocytes: 0.03 10*3/uL (ref 0.00–0.07)
Basophils Absolute: 0 10*3/uL (ref 0.0–0.1)
Basophils Relative: 1 %
Eosinophils Absolute: 0.1 10*3/uL (ref 0.0–0.5)
Eosinophils Relative: 1 %
HCT: 44.9 % (ref 39.0–52.0)
Hemoglobin: 15.1 g/dL (ref 13.0–17.0)
Immature Granulocytes: 1 %
Lymphocytes Relative: 16 %
Lymphs Abs: 1 10*3/uL (ref 0.7–4.0)
MCH: 33.6 pg (ref 26.0–34.0)
MCHC: 33.6 g/dL (ref 30.0–36.0)
MCV: 100 fL (ref 80.0–100.0)
Monocytes Absolute: 0.8 10*3/uL (ref 0.1–1.0)
Monocytes Relative: 13 %
Neutro Abs: 4.3 10*3/uL (ref 1.7–7.7)
Neutrophils Relative %: 68 %
Platelets: 138 10*3/uL — ABNORMAL LOW (ref 150–400)
RBC: 4.49 MIL/uL (ref 4.22–5.81)
RDW: 13.2 % (ref 11.5–15.5)
WBC: 6.3 10*3/uL (ref 4.0–10.5)
nRBC: 0 % (ref 0.0–0.2)

## 2021-02-27 LAB — BASIC METABOLIC PANEL
Anion gap: 8 (ref 5–15)
BUN: 42 mg/dL — ABNORMAL HIGH (ref 8–23)
CO2: 25 mmol/L (ref 22–32)
Calcium: 8.8 mg/dL — ABNORMAL LOW (ref 8.9–10.3)
Chloride: 104 mmol/L (ref 98–111)
Creatinine, Ser: 1.36 mg/dL — ABNORMAL HIGH (ref 0.61–1.24)
GFR, Estimated: 52 mL/min — ABNORMAL LOW (ref >60)
Glucose, Bld: 98 mg/dL (ref 70–99)
Potassium: 4.6 mmol/L (ref 3.5–5.1)
Sodium: 137 mmol/L (ref 135–145)

## 2021-02-27 LAB — MAGNESIUM: Magnesium: 1.9 mg/dL (ref 1.7–2.4)

## 2021-02-27 LAB — TROPONIN I (HIGH SENSITIVITY)
Troponin I (High Sensitivity): 11 ng/L (ref <18)
Troponin I (High Sensitivity): 23 ng/L — ABNORMAL HIGH (ref <18)
Troponin I (High Sensitivity): 39 ng/L — ABNORMAL HIGH (ref <18)

## 2021-02-27 NOTE — ED Notes (Signed)
No c/o at this time.

## 2021-02-27 NOTE — ED Notes (Signed)
ED Provider at bedside. 

## 2021-02-27 NOTE — ED Triage Notes (Signed)
Pt reports felt heart fluttering x 45 mins; denies pain; hx afib

## 2021-02-27 NOTE — Discharge Instructions (Signed)
Follow-up with your cardiologist and your primary care doctor.  Come back to ER if you have any episodes of chest pain, difficulty breathing, passing out or other new concerning symptom.

## 2021-02-27 NOTE — ED Provider Notes (Signed)
[facility] EMERGENCY DEPARTMENT Provider Note   CSN: [id number] Arrival date & time: 02/27/21  1448     History Chief Complaint  Patient presents with   Palpitations    Gary [name] is a 85 y.o. male.  Presents to ER after having episode of feeling heart felt fluttering, lightheaded.  Reports that he was in his normal state of health earlier today when he was watching the [employer] football game and started to feel somewhat lightheaded, off, heart fluttering, checked heart rate and was 105.  Blood pressure was 160 systolic.  On arrival here, states that his symptoms resolved.  Symptoms were not associated with exertion and occurred at rest.  He did not have any associated chest pain, did not feel like he was going to pass out, no syncope.  No nausea or vomiting.  Currently has no symptoms.  Has longstanding history of atrial flutter, spontaneously rate controlled, on anticoagulation, follows with cardiology.  Croitoru.  Additional Patient history obtained from chart review.  HPI     Past Medical History:  Diagnosis Date   Adenomatous colon polyp 06/2000   ALLERGIC RHINITIS 10/03/2007   Allergy    seasonal   Arthritis    knees   Atrial flutter ([place]) 06/26/2014   Cancer ([place])    skin cancers removed in the past basal cell and squamous cell   CARDIAC MURMUR, AORTIC 10/03/2007   ERECTILE DYSFUNCTION 10/03/2007   High blood pressure 03/2016   HYPERLIPIDEMIA 10/03/2007   Rosacea    Sleep apnea     Patient Active Problem List   Diagnosis Date Noted   Rosacea 07/09/2018   Impaired fasting glucose 07/09/2018   S/P total knee replacement 08/27/2017   Essential hypertension 04/05/2016   Obstructive sleep apnea 01/01/2015   Periodic limb movement 01/01/2015   Typical atrial flutter (HCC)    Cardiac arrhythmia 05/14/2014   BPH associated with nocturia 05/12/2014   Calculus of parotid gland 09/02/2013   Murmur, heart 01/22/2012   Aortic stenosis 12/20/2010    LIBIDO, DECREASED 10/19/2009   Hypercholesterolemia 10/03/2007   ERECTILE DYSFUNCTION 10/03/2007   ALLERGIC RHINITIS 10/03/2007    Past Surgical History:  Procedure Laterality Date   ADENOIDECTOMY     CARDIOVERSION N/A 07/01/2014   Procedure: CARDIOVERSION;  Surgeon: [name], MD;  Location: [facility];  Service: Cardiovascular;  Laterality: N/A;   COLONOSCOPY     PALATE SURGERY     TEE WITHOUT CARDIOVERSION N/A 07/01/2014   Procedure: TRANSESOPHAGEAL ECHOCARDIOGRAM (TEE);  Surgeon: [name], MD;  Location: [facility];  Service: Cardiovascular;  Laterality: N/A;   TONSILLECTOMY     TOTAL KNEE ARTHROPLASTY Right 08/27/2017   Procedure: RIGHT TOTAL KNEE ARTHROPLASTY;  Surgeon: Vickey Huger, MD;  Location: [facility];  Service: Orthopedics;  Laterality: Right;       Family History  Problem Relation Age of Onset   Diabetes Mother    Cancer Father        liver ca   Liver cancer Father    Hypotension Sister    CAD Brother    Stomach cancer Paternal Aunt     Social History   Tobacco Use   Smoking status: Former   Smokeless tobacco: Never  Scientific laboratory technician Use: Never used  Substance Use Topics   Alcohol use: Yes    Alcohol/week: 8.0 standard drinks    Types: 8 Glasses of wine per week    Comment: ocassionally    Drug use: No  Home Medications Prior to Admission medications   Medication Sig Start Date End Date Taking? Authorizing Provider  allopurinol (ZYLOPRIM) 100 MG tablet Take 2 tablets (200 mg total) by mouth daily. 02/07/21   Caren Macadam, MD  co-enzyme Q-10 30 MG capsule Take 30 mg by mouth every other day.     [provider]  ELIQUIS 5 MG TABS tablet TAKE 1 TABLET TWICE DAILY 01/17/21   Croitoru, Mihai, MD  fluticasone (FLONASE) 50 MCG/ACT nasal spray Place 1 spray into both nostrils every other day. 02/16/20   Koberlein, Steele Berg, MD  lisinopril (ZESTRIL) 5 MG tablet Take 2 tablets (10 mg total) by mouth daily. 09/16/20   Troy Sine, MD  minocycline (MINOCIN,DYNACIN) 100 MG capsule Take 100 mg by mouth every other day.     [provider]  Multiple Vitamin (MULTIVITAMIN) tablet Take 1 tablet by mouth daily.    [provider]  simvastatin (ZOCOR) 20 MG tablet Take 1 tablet (20 mg total) by mouth every other day. 09/16/20   Caren Macadam, MD  tamsulosin (FLOMAX) 0.4 MG CAPS capsule TAKE 1 CAPSULE EVERY DAY 03/23/20   Caren Macadam, MD    Allergies    Patient has no known allergies.  Review of Systems   Review of Systems  Constitutional:  Positive for fatigue. Negative for chills and fever.  HENT:  Negative for ear pain and sore throat.   Eyes:  Negative for pain and visual disturbance.  Respiratory:  Negative for cough and shortness of breath.   Cardiovascular:  Positive for palpitations. Negative for chest pain.  Gastrointestinal:  Negative for abdominal pain and vomiting.  Genitourinary:  Negative for dysuria and hematuria.  Musculoskeletal:  Negative for arthralgias and back pain.  Skin:  Negative for color change and rash.  Neurological:  Positive for light-headedness. Negative for seizures and syncope.  All other systems reviewed and are negative.  Physical Exam Updated Vital Signs BP (!) 172/90   Pulse 65   Temp 98 F (36.7 C) (Oral)   Resp 16   Ht 6\' 3"  (1.905 m)   Wt 111.1 kg   SpO2 100%   BMI 30.62 kg/m   Physical Exam Vitals and nursing note reviewed.  Constitutional:      Appearance: He is well-developed.  HENT:     Head: Normocephalic and atraumatic.  Eyes:     Conjunctiva/sclera: Conjunctivae normal.  Cardiovascular:     Rate and Rhythm: Normal rate. Rhythm irregular.     Pulses: Normal pulses.     Heart sounds: No murmur heard. Pulmonary:     Effort: Pulmonary effort is normal. No respiratory distress.     Breath sounds: Normal breath sounds.  Abdominal:     Palpations: Abdomen is soft.     Tenderness: There is no abdominal tenderness.   Musculoskeletal:        General: No deformity or signs of injury.     Cervical back: Neck supple.  Skin:    General: Skin is warm and dry.  Neurological:     General: No focal deficit present.     Mental Status: He is alert.  Psychiatric:        Mood and Affect: Mood normal.    ED Results / Procedures / Treatments   Labs (all labs ordered are listed, but only abnormal results are displayed) Labs Reviewed  CBC WITH DIFFERENTIAL/PLATELET - Abnormal; Notable for the following components:      Result Value  Platelets 138 (*)    All other components within normal limits  BASIC METABOLIC PANEL - Abnormal; Notable for the following components:   BUN 42 (*)    Creatinine, Ser 1.36 (*)    Calcium 8.8 (*)    GFR, Estimated 52 (*)    All other components within normal limits  TROPONIN I (HIGH SENSITIVITY) - Abnormal; Notable for the following components:   Troponin I (High Sensitivity) 23 (*)    All other components within normal limits  TROPONIN I (HIGH SENSITIVITY) - Abnormal; Notable for the following components:   Troponin I (High Sensitivity) 39 (*)    All other components within normal limits  MAGNESIUM  TROPONIN I (HIGH SENSITIVITY)    EKG EKG Interpretation  Date/Time:  Sunday February 27 2021 14:54:11 EDT Ventricular Rate:  80 PR Interval:    QRS Duration: 90 QT Interval:  376 QTC Calculation: 433 R Axis:   -24 Text Interpretation: Atrial flutter with variable A-V block Low voltage QRS Inferior infarct , age undetermined Cannot rule out Anterior infarct , age undetermined Confirmed by Jahdiel Krol ([id number]) on 02/27/2021 3:02:42 PM  Radiology DG Chest 2 View  Result Date: 02/27/2021 CLINICAL DATA:  Chest pain. EXAM: CHEST - 2 VIEW COMPARISON:  Chest CT 06/06/2005 FINDINGS: Upper normal heart size. Normal mediastinal contours with aortic atherosclerosis. Minor left lung base atelectasis. No confluent airspace disease. No pulmonary edema or pleural effusion. No  pneumothorax. Thoracic spondylosis without acute osseous abnormality. IMPRESSION: Upper normal heart size. Minor left lung base atelectasis. Electronically Signed   By: Melanie  Sanford M.D.   On: 02/27/2021 16:10    Procedures Procedures   Medications Ordered in ED Medications - No data to display  ED Course  I have reviewed the triage vital signs and the nursing notes.  Pertinent labs & imaging results that were available during my care of the patient were reviewed by me and considered in my medical decision making (see chart for details).    MDM Rules/Calculators/A&P                           84  year old male presents to ER after having episode of heart fluttering/lightheadedness.  On exam in ER he appears well in no distress.  Noted to be somewhat hypertensive but vitals otherwise normal.  EKG demonstrating atrial flutter, rate is controlled.  Longstanding history of atrial flutter.  Initial troponin normal, basic labs stable.  Suspect he may have had episode of A. fib/flutter with rapid rate.  Abdomen abundance of precaution and check serial troponins, repeat troponin was mildly elevated, check third troponin which was still slightly elevated.  Reviewed the case with Dr. Humphrey Rolls with cardiology.  Given the troponin levels, he does not feel this is consistent with ACS and suspects more likely this was more likely related to his atrial flutter.  He recommends discharge and outpatient follow-up.  On reassessment patient remains asymptomatic with stable heart rate.  Reviewed return precautions and discharged.  After the discussed management above, the patient was determined to be safe for discharge.  The patient was in agreement with this plan and all questions regarding their care were answered.  ED return precautions were discussed and the patient will return to the ED with any significant worsening of condition.  Final Clinical Impression(s) / ED Diagnoses Final diagnoses:  Atrial flutter,  unspecified type ([place])    Rx / DC Orders ED Discharge Orders  None        Lucrezia Starch, MD 02/27/21 2028

## 2021-03-01 ENCOUNTER — Other Ambulatory Visit: Payer: Self-pay

## 2021-03-01 ENCOUNTER — Encounter: Payer: Self-pay | Admitting: General Practice

## 2021-03-01 ENCOUNTER — Ambulatory Visit: Payer: Medicare HMO | Admitting: General Practice

## 2021-03-01 VITALS — BP 140/80 | HR 57 | Ht 75.0 in | Wt 251.4 lb

## 2021-03-01 DIAGNOSIS — I10 Essential (primary) hypertension: Secondary | ICD-10-CM | POA: Diagnosis not present

## 2021-03-01 DIAGNOSIS — E785 Hyperlipidemia, unspecified: Secondary | ICD-10-CM

## 2021-03-01 DIAGNOSIS — I483 Typical atrial flutter: Secondary | ICD-10-CM

## 2021-03-01 DIAGNOSIS — G4733 Obstructive sleep apnea (adult) (pediatric): Secondary | ICD-10-CM | POA: Diagnosis not present

## 2021-03-01 NOTE — Progress Notes (Signed)
Cardiology Clinic Note   Patient Name: Gary [name] Date of Encounter: 03/01/2021  Primary Care Provider:  Caren Macadam, MD Primary Cardiologist:  [name], MD  Patient Profile    Gary [name] 85 year old male presents to the clinic today for follow-up evaluation of his essential hypertension, and atrial flutter.  Past Medical History    Past Medical History:  Diagnosis Date   Adenomatous colon polyp 06/2000   ALLERGIC RHINITIS 10/03/2007   Allergy    seasonal   Arthritis    knees   Atrial flutter ([place]) 06/26/2014   Cancer ([place])    skin cancers removed in the past basal cell and squamous cell   CARDIAC MURMUR, AORTIC 10/03/2007   ERECTILE DYSFUNCTION 10/03/2007   High blood pressure 03/2016   HYPERLIPIDEMIA 10/03/2007   Rosacea    Sleep apnea    Past Surgical History:  Procedure Laterality Date   ADENOIDECTOMY     CARDIOVERSION N/A 07/01/2014   Procedure: CARDIOVERSION;  Surgeon: [name], MD;  Location: [facility];  Service: Cardiovascular;  Laterality: N/A;   COLONOSCOPY     PALATE SURGERY     TEE WITHOUT CARDIOVERSION N/A 07/01/2014   Procedure: TRANSESOPHAGEAL ECHOCARDIOGRAM (TEE);  Surgeon: [name], MD;  Location: [facility];  Service: Cardiovascular;  Laterality: N/A;   TONSILLECTOMY     TOTAL KNEE ARTHROPLASTY Right 08/27/2017   Procedure: RIGHT TOTAL KNEE ARTHROPLASTY;  Surgeon: Vickey Huger, MD;  Location: [facility];  Service: Orthopedics;  Laterality: Right;    Allergies  No Known Allergies  History of Present Illness    [name] has a PMH of cardiac murmur, aortic stenosis, atrial flutter, HTN, HLD, BPH, ED, and allergic rhinitis.  He was last seen by Dr. Sallyanne Kuster 03/30/2020.  During that time he continued to exercise with a trainer 2 times per week.  He remained on a percent compliant with his CPAP and denied daytime somnolence.  He denied bleeding issues and reported compliance with his Eliquis.  He was chest pain-free,  denied dyspnea on exertion, orthopnea, PND, palpitations, lower extremity swelling, and unexpected weight gain.  His blood pressure was well controlled at 108/70.  His previous echocardiogram showed aortic sclerosis without stenosis and dilated left atrium.  He presented to the emergency department on 02/27/2021.  He reported that he felt his heart was fluttering and that he was lightheaded.  He had been watching the [employer] football game and started to feel somewhat lightheaded.  He checked his pulse and his heart rate was 105.  His blood pressure was 852 systolic.  On arrival to the emergency department his symptoms had resolved.  He denied nausea or vomiting.  He reported compliance with his anticoagulation.  His EKG showed atrial flutter with variable AV block 80 bpm.  His initial troponin and lab work were unremarkable.  He remained asymptomatic and was stable.  He was discharged in stable condition.  He presents the clinic today for follow-up evaluation states he feels well.  He denies further episodes of accelerated heartbeat.  We reviewed his day leading up to his episode.  It does not appear that he has made any significant changes to his diet, exercise, hydration status etc.  He reports compliance with his CPAP device.  We reviewed triggers for atrial fibrillation and his EKGs.  We discussed the possibility of using cardiac event monitor in future if symptoms return.  He continues to be compliant with his apixaban and denies bleeding issues.  I will give him triggers for palpitations, have him continue to use his CPAP regularly, and have him follow-up as scheduled.  Today he denies chest pain, shortness of breath, lower extremity edema, fatigue, palpitations, melena, hematuria, hemoptysis, diaphoresis, weakness, presyncope, syncope, orthopnea, and PND.   Home Medications    Prior to Admission medications   Medication Sig Start Date End Date Taking? Authorizing Provider  allopurinol  (ZYLOPRIM) 100 MG tablet Take 2 tablets (200 mg total) by mouth daily. 02/07/21   Caren Macadam, MD  co-enzyme Q-10 30 MG capsule Take 30 mg by mouth every other day.     [provider]  ELIQUIS 5 MG TABS tablet TAKE 1 TABLET TWICE DAILY 01/17/21   Croitoru, Mihai, MD  fluticasone (FLONASE) 50 MCG/ACT nasal spray Place 1 spray into both nostrils every other day. 02/16/20   Koberlein, Steele Berg, MD  lisinopril (ZESTRIL) 5 MG tablet Take 2 tablets (10 mg total) by mouth daily. 09/16/20   Troy Sine, MD  minocycline (MINOCIN,DYNACIN) 100 MG capsule Take 100 mg by mouth every other day.     [provider]  Multiple Vitamin (MULTIVITAMIN) tablet Take 1 tablet by mouth daily.    [provider]  simvastatin (ZOCOR) 20 MG tablet Take 1 tablet (20 mg total) by mouth every other day. 09/16/20   Caren Macadam, MD  tamsulosin (FLOMAX) 0.4 MG CAPS capsule TAKE 1 CAPSULE EVERY DAY 03/23/20   Caren Macadam, MD    Family History    Family History  Problem Relation Age of Onset   Diabetes Mother    Cancer Father        liver ca   Liver cancer Father    Hypotension Sister    CAD Brother    Stomach cancer Paternal Aunt    He indicated that his mother is deceased. He indicated that his father is deceased. He indicated that his sister is alive. He indicated that his brother is alive. He indicated that his maternal grandmother is deceased. He indicated that his maternal grandfather is deceased. He indicated that his paternal grandmother is deceased. He indicated that his paternal grandfather is deceased. He indicated that the status of his paternal aunt is unknown.  Social History    Social History   Socioeconomic History   Marital status: Married    Spouse name: Not on file   Number of children: 2   Years of education: Not on file   Highest education level: Not on file  Occupational History   Occupation: IT trainer company    Comment:  retired  Tobacco Use   Smoking status: Former   Smokeless tobacco: Never  Scientific laboratory technician Use: Never used  Substance and Sexual Activity   Alcohol use: Yes    Alcohol/week: 8.0 standard drinks    Types: 8 Glasses of wine per week    Comment: ocassionally    Drug use: No   Sexual activity: Not on file  Other Topics Concern   Not on file  Social History Narrative   Lives with wife in two story house, but occupies main level mostly   Has 4 children, two biological, two stepchildren, 9 grandkids   One son local, supportive    Active playing golf with friends, goes to Physiological scientist 2 days/week at [employer]   Enjoys reading, soduku   Social Determinants of Health   Financial Resource Strain: Low Risk    Difficulty of Paying Living  Expenses: Not hard at all  Food Insecurity: No Food Insecurity   Worried About Charity fundraiser in the Last Year: Never true   Ran Out of Food in the Last Year: Never true  Transportation Needs: No Transportation Needs   Lack of Transportation (Medical): No   Lack of Transportation (Non-Medical): No  Physical Activity: Insufficiently Active   Days of Exercise per Week: 2 days   Minutes of Exercise per Session: 50 min  Stress: No Stress Concern Present   Feeling of Stress : Not at all  Social Connections: Socially Integrated   Frequency of Communication with Friends and Family: More than three times a week   Frequency of Social Gatherings with Friends and Family: More than three times a week   Attends Religious Services: More than 4 times per year   Active Member of [employer] or Organizations: Yes   Attends Archivist Meetings: Never   Marital Status: Married  Human resources officer Violence: Not At Risk   Fear of Current or Ex-Partner: No   Emotionally Abused: No   Physically Abused: No   Sexually Abused: No     Review of Systems    General:  No chills, fever, night sweats or weight changes.  Cardiovascular:  No chest pain,  dyspnea on exertion, edema, orthopnea, palpitations, paroxysmal nocturnal dyspnea. Dermatological: No rash, lesions/masses Respiratory: No cough, dyspnea Urologic: No hematuria, dysuria Abdominal:   No nausea, vomiting, diarrhea, bright red blood per rectum, melena, or hematemesis Neurologic:  No visual changes, wkns, changes in mental status. All other systems reviewed and are otherwise negative except as noted above.  Physical Exam    VS:  BP 140/80 (BP Location: Left Arm)   Pulse (!) 57   Ht 6\' 3"  (1.905 m)   Wt 251 lb 6.4 oz (114 kg)   SpO2 97%   BMI 31.42 kg/m  , BMI Body mass index is 31.42 kg/m. GEN: Well nourished, well developed, in no acute distress. HEENT: normal. Neck: Supple, no JVD, carotid bruits, or masses. Cardiac: RRR, no murmurs, rubs, or gallops. No clubbing, cyanosis, edema.  Radials/DP/PT 2+ and equal bilaterally.  Respiratory:  Respirations regular and unlabored, clear to auscultation bilaterally. GI: Soft, nontender, nondistended, BS + x 4. MS: no deformity or atrophy. Skin: warm and dry, no rash. Neuro:  Strength and sensation are intact. Psych: Normal affect.  Accessory Clinical Findings    Recent Labs: 02/02/2021: ALT 17 02/27/2021: BUN 42; Creatinine, Ser 1.36; Hemoglobin 15.1; Magnesium 1.9; Platelets 138; Potassium 4.6; Sodium 137   Recent Lipid Panel    Component Value Date/Time   CHOL 143 02/02/2021 1007   TRIG 81.0 02/02/2021 1007   HDL 41.20 02/02/2021 1007   CHOLHDL 3 02/02/2021 1007   VLDL 16.2 02/02/2021 1007   LDLCALC 86 02/02/2021 1007   LDLCALC 83 01/26/2020 0913   LDLDIRECT 143.5 01/22/2012 0916    ECG personally reviewed by me today-atrial flutter 57 bpm  Echocardiogram 07/01/2014  Study Conclusions   - Left ventricle: Systolic function was normal. The estimated    ejection fraction was in the range of 55% to 60%. Wall motion was    normal; there were no regional wall motion abnormalities.  - Aortic valve: There was  trivial regurgitation.  - Mitral valve: There was mild regurgitation.  - Left atrium: No evidence of thrombus in the atrial cavity or    appendage. The appendage was morphologically a left appendage,    multilobulated, and of normal size.  Emptying velocity was mildly    reduced.  - Right atrium: No evidence of thrombus in the atrial cavity or    appendage.    Assessment & Plan   1.  Atrial flutter-heart rate today 57 .  Has had no further episodes of lightheadedness or accelerated heartbeat.  Reports compliance with his apixaban and denies bleeding issues. Continue apixaban Heart healthy low-sodium diet-salty 6 given Maintain physical activity  Avoid triggers caffeine, chocolate, EtOH, dehydration etc.  Essential hypertension-BP today 140/80.  Well-controlled at home, 120s over 76s. Continue blood pressure log Continue lisinopril Heart healthy low-sodium diet-salty 6 given Maintain physical activity   Hyperlipidemia-02/02/2021: Cholesterol 143; HDL 41.20; LDL Cholesterol 86; Triglycerides 81.0; VLDL 16.2 Continue simvastatin, co-Q10 Heart healthy low-sodium diet-salty 6 given Increase physical activity as tolerated  OSA-continues compliance with CPAP.  Denies daytime somnolence. Continue CPAP use Continue weight loss  Disposition: Follow-up with Dr. Sallyanne Kuster or me as scheduled.  Jossie Ng. Elliannah Wayment NP-C    03/01/2021, 12:09 PM [address] Office [phone] Fax [phone]  Notice: This dictation was prepared with Dragon dictation along with smaller phrase technology. Any transcriptional errors that result from this process are unintentional and may not be corrected upon review.  I spent 14 minutes examining this patient, reviewing medications, and using patient centered shared decision making involving her cardiac care.  Prior to her visit I spent greater than 20 minutes reviewing her past medical history,  medications,  and prior cardiac tests.

## 2021-03-01 NOTE — Patient Instructions (Signed)
Medication Instructions:  The current medical regimen is effective;  continue present plan and medications as directed. Please refer to the Current Medication list given to you today.   *If you need a refill on your cardiac medications before your next appointment, please call your pharmacy*  Lab Work:   Testing/Procedures:  NONE    NONE  Special Instructions PLEASE READ AND FOLLOW HEART HEALTHY DIET-ATTACHED  PLEASE MAINTAIN PHYSICAL ACTIVITY AS TOLERATED,   CONTINUE CPAP USE  INCREASE HYDRATION  Please try to avoid these triggers: Do not use any products that have nicotine or tobacco in them. These include cigarettes, e-cigarettes, and chewing tobacco. If you need help quitting, ask your doctor. Eat heart-healthy foods. Talk with your doctor about the right eating plan for you. Exercise regularly as told by your doctor. Stay hydrated Do not drink alcohol, Caffeine or chocolate. Lose weight if you are overweight. Do not use drugs, including cannabis   Follow-Up: Your next appointment:  KEEP APPOINTMENT 04-07-2021  In Person with [name], MD   At [facility], you and your health needs are our priority.  As part of our continuing mission to provide you with exceptional heart care, we have created designated Provider Care Teams.  These Care Teams include your primary Cardiologist (physician) and Advanced Practice Providers (APPs -  Physician Assistants and Nurse Practitioners) who all work together to provide you with the care you need, when you need it.           Heart-Healthy Eating Plan Heart-healthy meal planning includes: Eating less unhealthy fats. Eating more healthy fats. Making other changes in your diet. Talk with your doctor or a diet specialist (dietitian) to create an eating plan that is right for you. What is my plan? Your doctor may recommend an eating plan that includes: Total fat: ______% or less of total calories a day. Saturated fat: ______% or less  of total calories a day. Cholesterol: less than _________mg a day. What are tips for following this plan? Cooking Avoid frying your food. Try to bake, boil, grill, or broil it instead. You can also reduce fat by: Removing the skin from poultry. Removing all visible fats from meats. Steaming vegetables in water or broth. Meal planning  At meals, divide your plate into four equal parts: Fill one-half of your plate with vegetables and green salads. Fill one-fourth of your plate with whole grains. Fill one-fourth of your plate with lean protein foods. Eat 4-5 servings of vegetables per day. A serving of vegetables is: 1 cup of raw or cooked vegetables. 2 cups of raw leafy greens. Eat 4-5 servings of fruit per day. A serving of fruit is: 1 medium whole fruit.  cup of dried fruit.  cup of fresh, frozen, or canned fruit.  cup of 100% fruit juice. Eat more foods that have soluble fiber. These are apples, broccoli, carrots, beans, peas, and barley. Try to get 20-30 g of fiber per day. Eat 4-5 servings of nuts, legumes, and seeds per week: 1 serving of dried beans or legumes equals  cup after being cooked. 1 serving of nuts is  cup. 1 serving of seeds equals 1 tablespoon. General information Eat more home-cooked food. Eat less restaurant, buffet, and fast food. Limit or avoid alcohol. Limit foods that are high in starch and sugar. Avoid fried foods. Lose weight if you are overweight. Keep track of how much salt (sodium) you eat. This is important if you have high blood pressure. Ask your doctor to  tell you more about this. Try to add vegetarian meals each week. Fats Choose healthy fats. These include olive oil and canola oil, flaxseeds, walnuts, almonds, and seeds. Eat more omega-3 fats. These include salmon, mackerel, sardines, tuna, flaxseed oil, and ground flaxseeds. Try to eat fish at least 2 times each week. Check food labels. Avoid foods with trans fats or high amounts of  saturated fat. Limit saturated fats. These are often found in animal products, such as meats, butter, and cream. These are also found in plant foods, such as palm oil, palm kernel oil, and coconut oil. Avoid foods with partially hydrogenated oils in them. These have trans fats. Examples are stick margarine, some tub margarines, cookies, crackers, and other baked goods. What foods can I eat? Fruits All fresh, canned (in natural juice), or frozen fruits. Vegetables Fresh or frozen vegetables (raw, steamed, roasted, or grilled). Green salads. Grains Most grains. Choose whole wheat and whole grains most of the time. Rice and pasta, including brown rice and pastas made with whole wheat. Meats and other proteins Lean, well-trimmed beef, veal, pork, and lamb. Chicken and [place] without skin. All fish and shellfish. Wild duck, rabbit, pheasant, and venison. Egg whites or low-cholesterol egg substitutes. Dried beans, peas, lentils, and tofu. Seeds and most nuts. Dairy Low-fat or nonfat cheeses, including ricotta and mozzarella. Skim or 1% milk that is liquid, powdered, or evaporated. Buttermilk that is made with low-fat milk. Nonfat or low-fat yogurt. Fats and oils Non-hydrogenated (trans-free) margarines. Vegetable oils, including soybean, sesame, sunflower, olive, peanut, safflower, corn, canola, and cottonseed. Salad dressings or mayonnaise made with a vegetable oil. Beverages Mineral water. Coffee and tea. Diet carbonated beverages. Sweets and desserts Sherbet, gelatin, and fruit ice. Small amounts of dark chocolate. Limit all sweets and desserts. Seasonings and condiments All seasonings and condiments. The items listed above may not be a complete list of foods and drinks you can eat. Contact a dietitian for more options. What foods should I avoid? Fruits Canned fruit in heavy syrup. Fruit in cream or butter sauce. Fried fruit. Limit coconut. Vegetables Vegetables cooked in cheese, cream, or  butter sauce. Fried vegetables. Grains Breads that are made with saturated or trans fats, oils, or whole milk. Croissants. Sweet rolls. Donuts. High-fat crackers, such as cheese crackers. Meats and other proteins Fatty meats, such as hot dogs, ribs, sausage, bacon, rib-eye roast or steak. High-fat deli meats, such as salami and bologna. Caviar. Domestic duck and goose. Organ meats, such as liver. Dairy Cream, sour cream, cream cheese, and creamed cottage cheese. Whole-milk cheeses. Whole or 2% milk that is liquid, evaporated, or condensed. Whole buttermilk. Cream sauce or high-fat cheese sauce. Yogurt that is made from whole milk. Fats and oils Meat fat, or shortening. Cocoa butter, hydrogenated oils, palm oil, coconut oil, palm kernel oil. Solid fats and shortenings, including bacon fat, salt pork, lard, and butter. Nondairy cream substitutes. Salad dressings with cheese or sour cream. Beverages Regular sodas and juice drinks with added sugar. Sweets and desserts Frosting. Pudding. Cookies. Cakes. Pies. Milk chocolate or white chocolate. Buttered syrups. Full-fat ice cream or ice cream drinks. The items listed above may not be a complete list of foods and drinks to avoid. Contact a dietitian for more information. Summary Heart-healthy meal planning includes eating less unhealthy fats, eating more healthy fats, and making other changes in your diet. Eat a balanced diet. This includes fruits and vegetables, low-fat or nonfat dairy, lean protein, nuts and legumes, whole grains, and heart-healthy oils  and fats. This information is not intended to replace advice given to you by your health care provider. Make sure you discuss any questions you have with your health care provider. Document Revised: 08/26/2020 Document Reviewed: 08/26/2020 Elsevier Patient Education  2022 [employer].

## 2021-03-04 ENCOUNTER — Other Ambulatory Visit: Payer: Self-pay | Admitting: Family Medicine

## 2021-03-04 DIAGNOSIS — N400 Enlarged prostate without lower urinary tract symptoms: Secondary | ICD-10-CM

## 2021-03-09 DIAGNOSIS — H43813 Vitreous degeneration, bilateral: Secondary | ICD-10-CM | POA: Diagnosis not present

## 2021-03-09 DIAGNOSIS — H35361 Drusen (degenerative) of macula, right eye: Secondary | ICD-10-CM | POA: Diagnosis not present

## 2021-03-09 DIAGNOSIS — H4323 Crystalline deposits in vitreous body, bilateral: Secondary | ICD-10-CM | POA: Diagnosis not present

## 2021-03-09 DIAGNOSIS — H34832 Tributary (branch) retinal vein occlusion, left eye, with macular edema: Secondary | ICD-10-CM | POA: Diagnosis not present

## 2021-03-10 ENCOUNTER — Telehealth: Payer: Medicare HMO

## 2021-04-07 ENCOUNTER — Ambulatory Visit: Payer: Medicare HMO | Admitting: Cardiovascular Disease

## 2021-04-18 ENCOUNTER — Other Ambulatory Visit: Payer: Self-pay | Admitting: Family Medicine

## 2021-05-04 DIAGNOSIS — L821 Other seborrheic keratosis: Secondary | ICD-10-CM | POA: Diagnosis not present

## 2021-05-04 DIAGNOSIS — Z85828 Personal history of other malignant neoplasm of skin: Secondary | ICD-10-CM | POA: Diagnosis not present

## 2021-05-04 DIAGNOSIS — L820 Inflamed seborrheic keratosis: Secondary | ICD-10-CM | POA: Diagnosis not present

## 2021-05-04 DIAGNOSIS — D1801 Hemangioma of skin and subcutaneous tissue: Secondary | ICD-10-CM | POA: Diagnosis not present

## 2021-05-04 DIAGNOSIS — L720 Epidermal cyst: Secondary | ICD-10-CM | POA: Diagnosis not present

## 2021-05-04 DIAGNOSIS — L812 Freckles: Secondary | ICD-10-CM | POA: Diagnosis not present

## 2021-05-20 ENCOUNTER — Ambulatory Visit (INDEPENDENT_AMBULATORY_CARE_PROVIDER_SITE_OTHER): Payer: Medicare HMO

## 2021-05-20 ENCOUNTER — Telehealth: Payer: Self-pay | Admitting: Pharmacist

## 2021-05-20 VITALS — Ht 75.0 in | Wt 251.0 lb

## 2021-05-20 DIAGNOSIS — Z0000 Encounter for general adult medical examination without abnormal findings: Secondary | ICD-10-CM

## 2021-05-20 NOTE — Progress Notes (Signed)
Subjective:   Gary [name] is a 86 y.o. male who presents for Medicare Annual/Subsequent preventive examination.  Review of Systems    No ROS Cardiac Risk Factors include: advanced age (>65men, >33 women);hypertension     Objective:    Today's Vitals   05/20/21 0816  Weight: 251 lb (113.9 kg)  Height: 6\' 3"  (1.905 m)   Body mass index is 31.37 kg/m.  Advanced Directives 05/20/2021 02/27/2021 06/04/2020 11/26/2019 07/10/2018 08/27/2017 08/16/2017  Does Patient Have a Medical Advance Directive? Yes Yes Yes Yes Yes Yes Yes  Type of Paramedic of [place];Living will [place];Living will Living will;Healthcare Power of [place];Living will [place];Living will [place];Living will  Does patient want to make changes to medical advance directive? No - Patient declined - - - No - Patient declined No - Patient declined No - Patient declined  Copy of [place] in Chart? No - copy requested - Yes - validated most recent copy scanned in chart (See row information) Yes - validated most recent copy scanned in chart (See row information) No - copy requested No - copy requested Yes  Would patient like information on creating a medical advance directive? - - - - - No - Patient declined -    Current Medications (verified) Outpatient Encounter Medications as of 05/20/2021  Medication Sig   allopurinol (ZYLOPRIM) 100 MG tablet TAKE 2 TABLETS EVERY DAY   co-enzyme Q-10 30 MG capsule Take 30 mg by mouth every other day.    ELIQUIS 5 MG TABS tablet TAKE 1 TABLET TWICE DAILY   fluticasone (FLONASE) 50 MCG/ACT nasal spray PLACE 1 SPRAY INTO BOTH NOSTRILS EVERY OTHER DAY.   lisinopril (ZESTRIL) 5 MG tablet Take 2 tablets (10 mg total) by mouth daily.   minocycline (MINOCIN,DYNACIN) 100 MG capsule Take 100 mg by mouth every other day.    Multiple  Vitamin (MULTIVITAMIN) tablet Take 1 tablet by mouth daily.   simvastatin (ZOCOR) 20 MG tablet Take 1 tablet (20 mg total) by mouth every other day.   tamsulosin (FLOMAX) 0.4 MG CAPS capsule TAKE 1 CAPSULE EVERY DAY   Facility-Administered Encounter Medications as of 05/20/2021  Medication   0.9 %  sodium chloride infusion    Allergies (verified) Patient has no known allergies.   History: Past Medical History:  Diagnosis Date   Adenomatous colon polyp 06/2000   ALLERGIC RHINITIS 10/03/2007   Allergy    seasonal   Arthritis    knees   Atrial flutter ([place]) 06/26/2014   Cancer ([place])    skin cancers removed in the past basal cell and squamous cell   CARDIAC MURMUR, AORTIC 10/03/2007   ERECTILE DYSFUNCTION 10/03/2007   High blood pressure 03/2016   HYPERLIPIDEMIA 10/03/2007   Rosacea    Sleep apnea    Past Surgical History:  Procedure Laterality Date   ADENOIDECTOMY     CARDIOVERSION N/A 07/01/2014   Procedure: CARDIOVERSION;  Surgeon: [name], MD;  Location: [facility];  Service: Cardiovascular;  Laterality: N/A;   COLONOSCOPY     PALATE SURGERY     TEE WITHOUT CARDIOVERSION N/A 07/01/2014   Procedure: TRANSESOPHAGEAL ECHOCARDIOGRAM (TEE);  Surgeon: [name], MD;  Location: [facility];  Service: Cardiovascular;  Laterality: N/A;   TONSILLECTOMY     TOTAL KNEE ARTHROPLASTY Right 08/27/2017   Procedure: RIGHT TOTAL KNEE ARTHROPLASTY;  Surgeon: Vickey Huger, MD;  Location:  [facility];  Service: Orthopedics;  Laterality: Right;   Family History  Problem Relation Age of Onset   Diabetes Mother    Cancer Father        liver ca   Liver cancer Father    Hypotension Sister    CAD Brother    Stomach cancer Paternal Aunt    Social History   Socioeconomic History   Marital status: Married    Spouse name: Not on file   Number of children: 2   Years of education: Not on file   Highest education level: Not on file  Occupational History   Occupation: IT trainer  company    Comment: retired  Tobacco Use   Smoking status: Former   Smokeless tobacco: Never  Scientific laboratory technician Use: Never used  Substance and Sexual Activity   Alcohol use: Yes    Alcohol/week: 8.0 standard drinks    Types: 8 Glasses of wine per week    Comment: ocassionally    Drug use: No   Sexual activity: Not on file  Other Topics Concern   Not on file  Social History Narrative   Lives with wife in two story house, but occupies main level mostly   Has 4 children, two biological, two stepchildren, 9 grandkids   One son local, supportive    Active playing golf with friends, goes to Physiological scientist 2 days/week at [employer]   Enjoys reading, soduku   Social Determinants of Health   Financial Resource Strain: Low Risk    Difficulty of Paying Living Expenses: Not hard at all  Food Insecurity: No Food Insecurity   Worried About Charity fundraiser in the Last Year: Never true   Arboriculturist in the Last Year: Never true  Transportation Needs: No Transportation Needs   Lack of Transportation (Medical): No   Lack of Transportation (Non-Medical): No  Physical Activity: Insufficiently Active   Days of Exercise per Week: 2 days   Minutes of Exercise per Session: 30 min  Stress: No Stress Concern Present   Feeling of Stress : Not at all  Social Connections: Socially Integrated   Frequency of Communication with Friends and Family: More than three times a week   Frequency of Social Gatherings with Friends and Family: More than three times a week   Attends Religious Services: More than 4 times per year   Active Member of [employer] or Organizations: Yes   Attends Music therapist: More than 4 times per year   Marital Status: Married    Clinical Intake:  Pre-visit preparation completed: Yes  Diabetic? No  Interpreter Needed?: NoActivities of Daily Living In your present state of health, do you have any difficulty performing the following activities:  05/20/2021 06/04/2020  Hearing? N N  Vision? N N  Difficulty concentrating or making decisions? N N  Walking or climbing stairs? N N  Dressing or bathing? N N  Doing errands, shopping? N N  Preparing Food and eating ? N N  Using the Toilet? N N  In the past six months, have you accidently leaked urine? N N  Do you have problems with loss of bowel control? N N  Managing your Medications? N N  Managing your Finances? N N  Housekeeping or managing your Housekeeping? N N  Some recent data might be hidden    Patient Care Team: Caren Macadam, MD as PCP - General (Family Medicine) Croitoru, Dani Gobble, MD as PCP -  Cardiology (Cardiology) Jarome Matin, MD as Consulting Physician (Dermatology) Troy Sine, MD as Consulting Physician (Cardiology) Desma Maxim, MD as Referring Physician (Ophthalmology) Viona Gilmore, [facility] as Pharmacist (Pharmacist)  Indicate any recent Medical Services you may have received from other than [facility] providers in the past year (date may be approximate).     Assessment:   This is a routine wellness examination for Jc. Virtual Visit via Telephone Note  I connected with  ELVIE MAINES on 05/20/21 at  8:15 AM EST by telephone and verified that I am speaking with the correct person using two identifiers.  Location: Patient: Home Provider: Office Persons participating in the virtual visit: patient/Nurse Health Advisor   I discussed the limitations, risks, security and privacy concerns of performing an evaluation and management service by telephone and the availability of in person appointments. The patient expressed understanding and agreed to proceed.  Interactive audio and video telecommunications were attempted between this nurse and patient, however failed, due to patient having technical difficulties OR patient did not have access to video capability.  We continued and completed visit with audio only.  Some vital signs may be absent or patient  reported.   Criselda Peaches, LPN   Hearing/Vision screen Hearing Screening - Comments:: No difficulty hearing Vision Screening - Comments:: Wears reading glasses. Followed by Dr Idolina Primer  Dietary issues and exercise activities discussed: Current Exercise Habits: Home exercise routine, Type of exercise: Other - see comments (Patient has personal trainer), Time (Minutes): 30, Frequency (Times/Week): 2, Weekly Exercise (Minutes/Week): 60, Intensity: Moderate   Goals Addressed             This Visit's Progress    Patient Stated       Continue to lose weight, with goal of 225lb-235lb by next year..       Depression Screen PHQ 2/9 Scores 05/20/2021 06/04/2020 07/14/2019 07/10/2018 06/14/2016 05/31/2015 05/12/2014  PHQ - 2 Score 0 0 0 0 0 0 0  PHQ- 9 Score - 0 - 0 - - -    Fall Risk Fall Risk  05/20/2021 06/04/2020 07/14/2019 07/10/2018 06/14/2016  Falls in the past year? 0 0 0 0 No  Number falls in past yr: 0 0 0 - -  Injury with Fall? 0 0 0 - -  Risk for fall due to : - No Fall Risks - Impaired vision -  Follow up - Falls evaluation completed;Falls prevention discussed - - -    FALL RISK PREVENTION PERTAINING TO THE HOME:  Any stairs in or around the home? Yes  If so, are there any without handrails? No  Home free of loose throw rugs in walkways, pet beds, electrical cords, etc? Yes  Adequate lighting in your home to reduce risk of falls? Yes   ASSISTIVE DEVICES UTILIZED TO PREVENT FALLS:  Life alert? No  Use of a cane, walker or w/c? No  Grab bars in the bathroom? No  Shower chair or bench in shower? No  Elevated toilet seat or a handicapped toilet? No   TIMED UP AND GO:  Was the test performed? No . Audio Visit  Cognitive Function:   6CIT Screen 05/20/2021  What Year? 0 points  What month? 0 points  What time? 0 points  Count back from 20 0 points  Months in reverse 0 points  Repeat phrase 0 points  Total Score 0    Immunizations Immunization History  Administered  Date(s) Administered   Fluad Quad(high Dose 65+) 01/17/2019,  01/26/2020, 02/02/2021   Influenza Split 01/22/2012   Influenza, High Dose Seasonal PF 03/13/2014, 05/31/2015, 03/14/2016, 03/18/2018   Influenza,inj,Quad PF,6+ Mos 04/15/2013   PFIZER(Purple Top)SARS-COV-2 Vaccination 06/02/2019, 06/29/2019   PNEUMOCOCCAL CONJUGATE-20 02/02/2021   Pneumococcal Conjugate-13 04/15/2013, 05/12/2014   Pneumococcal Polysaccharide-23 08/19/2004, 10/19/2009   Td 08/19/2004   Tdap 05/13/2016   Zoster Recombinat (Shingrix) 10/26/2019, 01/12/2020   Covid-19 vaccine status: Declined, Education has been provided regarding the importance of this vaccine but patient still declined. Advised may receive this vaccine at local pharmacy or Health Dept.or vaccine clinic. Aware to provide a copy of the vaccination record if obtained from local pharmacy or Health Dept. Verbalized acceptance and understanding.  Screening Tests Health Maintenance  Topic Date Due   COVID-19 Vaccine (3 - Booster for Pfizer series) 06/05/2021 (Originally 08/24/2019)   OPHTHALMOLOGY EXAM  06/09/2021 (Originally 03/31/2021)   HEMOGLOBIN A1C  08/03/2021   COLONOSCOPY (Pts 45-55yrs Insurance coverage will need to be confirmed)  01/31/2023   TETANUS/TDAP  05/13/2026   Pneumonia Vaccine 49+ Years old  Completed   INFLUENZA VACCINE  Completed   Zoster Vaccines- Shingrix  Completed   HPV VACCINES  Aged Out   FOOT EXAM  Discontinued    Health Maintenance  There are no preventive care reminders to display for this patient.  Additional Screening:  Vision Screening: Recommended annual ophthalmology exams for early detection of glaucoma and other disorders of the eye. Is the patient up to date with their annual eye exam?  Yes  Who is the provider or what is the name of the office in which the patient attends annual eye exams? Followed by Dr Idolina Primer   Dental Screening: Recommended annual dental exams for proper oral hygiene  Community  Resource Referral / Chronic Care Management:  CRR required this visit?  No   CCM required this visit?  No      Plan:     I have personally reviewed and noted the following in the patients chart:   Medical and social history Use of alcohol, tobacco or illicit drugs  Current medications and supplements including opioid prescriptions. Patient is not currently taking opioid prescriptions. Functional ability and status Nutritional status Physical activity Advanced directives List of other physicians Hospitalizations, surgeries, and ER visits in previous 12 months Vitals Screenings to include cognitive, depression, and falls Referrals and appointments  In addition, I have reviewed and discussed with patient certain preventive protocols, quality metrics, and best practice recommendations. A written personalized care plan for preventive services as well as general preventive health recommendations were provided to patient.     Criselda Peaches, LPN   12/23/2351

## 2021-05-20 NOTE — Patient Instructions (Addendum)
°  [name] , Thank you for taking time to come for your Medicare Wellness Visit. I appreciate your ongoing commitment to your health goals. Please review the following plan we discussed and let me know if I can assist you in the future.   These are the goals we discussed:  Goals      Patient Stated     Continue to lose weight, with goal of 225lb-235lb by next year..        This is a list of the screening recommended for you and due dates:  Health Maintenance  Topic Date Due   COVID-19 Vaccine (3 - Booster for Oxford series) 06/05/2021*   Eye exam for diabetics  06/09/2021*   Hemoglobin A1C  08/03/2021   Colon Cancer Screening  01/31/2023   Tetanus Vaccine  05/13/2026   Pneumonia Vaccine  Completed   Flu Shot  Completed   Zoster (Shingles) Vaccine  Completed   HPV Vaccine  Aged Out   Complete foot exam   Discontinued  *Topic was postponed. The date shown is not the original due date.   Marland Kitchenth

## 2021-05-20 NOTE — Chronic Care Management (AMB) (Signed)
° ° °  Chronic Care Management Pharmacy Assistant   Name: [name]  MRN: [id number] DOB: Sep 26, 1935  05/24/2021 APPOINTMENT REMINDER   Gary [name] was reminded to have all medications, supplements and any blood glucose and blood pressure readings available for review with [name], Pharm. D, at his telephone visit on 05/24/2021 at 1:00.   Questions: Have you had any recent office visit or specialist visit outside of [place]? Patient denies any visits outside of [facility]  Are there any concerns you would like to discuss during your office visit? Patient denies any concerns at this time  Are you having any problems obtaining your medications? (Whether it pharmacy issues or cost) Patient denies any issues getting medications.   If patient has any PAP medications ask if they are having any problems getting their PAP medication or refill? Patient denies getting any medications from PAP  Care Gaps: AWV - completed on 05/20/2021 Last BP - 140/80 on 03/01/2021  Star Rating Drug: Lisinopril 5mg  - last filled on 03/06/2021 90[facility] at [facility] Simvastatin 20mg  - last filled on 04/25/2021 90DS at [facility]  Any gaps in medications fill history?  [place] Pharmacist Assistant [phone]

## 2021-05-24 ENCOUNTER — Ambulatory Visit (INDEPENDENT_AMBULATORY_CARE_PROVIDER_SITE_OTHER): Payer: Medicare HMO | Admitting: Pharmacist

## 2021-05-24 DIAGNOSIS — I483 Typical atrial flutter: Secondary | ICD-10-CM

## 2021-05-24 DIAGNOSIS — I10 Essential (primary) hypertension: Secondary | ICD-10-CM

## 2021-05-24 NOTE — Patient Instructions (Signed)
Hi Jaramie,  It was great to catch up with you on the phone!  Please reach out to me if you have any questions or need anything before our follow up!  Best, Maddie  [name], PharmD, [facility]   Visit Information   Goals Addressed   None    Patient Care Plan: CCM Pharmacy Care Plan     Problem Identified: Problem: Hypertension, Hyperlipidemia, Atrial Fibrillation, BPH, Gout, Allergic Rhinitis and Rosacea      Long-Range Goal: Patient-Specific Goal   Start Date: 09/08/2020  Expected End Date: 09/08/2021  Recent Progress: On track  Priority: High  Note:   Current Barriers:  Unable to independently monitor therapeutic efficacy  Pharmacist Clinical Goal(s):  Patient will achieve adherence to monitoring guidelines and medication adherence to achieve therapeutic efficacy through collaboration with PharmD and provider.   Interventions: 1:1 collaboration with Caren Macadam, MD regarding development and update of comprehensive plan of care as evidenced by provider attestation and co-signature Inter-disciplinary care team collaboration (see longitudinal plan of care) Comprehensive medication review performed; medication list updated in electronic medical record  Hypertension (BP goal <140/90) -Controlled (per home readings) -Current treatment: Lisinopril 5 mg 1.5 tablets daily - Appropriate, Effective, Safe, Accessible -Medications previously tried: none -Current home readings: 120/60-70s (trying to check every day or every 2-3 days) -Current dietary habits: limiting added salt and switching out for a salt substitute if possible -Current exercise habits: works out with a trainer twice a week; working with the trainer -Denies hypotensive/hypertensive symptoms other than orthostatic hypotension -Educated on Exercise goal of 150 minutes per week; Importance of home blood pressure monitoring; Proper BP  monitoring technique; -Counseled to monitor BP at home a few times a week, document, and provide log at future appointments -Counseled on diet and exercise extensively Recommended to continue current medication Plan to repeat blood pressure assessment in a few weeks  Hyperlipidemia: (LDL goal < 70) -Controlled -Current treatment: Simvastatin 20 mg 1 tablet every other day - Appropriate, Effective, Safe, Accessible -Medications previously tried: none  -Current dietary patterns: eats out a few days a week -Current exercise habits: working out with a Physiological scientist -Educated on Cholesterol goals;  Benefits of statin for ASCVD risk reduction; Importance of limiting foods high in cholesterol; Exercise goal of 150 minutes per week; -Counseled on diet and exercise extensively Recommended to continue current medication Recommended getting an updated prescription for every other day.  Atrial Flutter (Goal: prevent stroke and major bleeding) -Controlled -CHADSVASC: 3 -Current treatment: Rate control: none Anticoagulation: Eliquis 5 mg 1 tablet twice daily - Appropriate, Effective, Safe, Accessible -Medications previously tried: none -Home BP and HR readings: 54-55 -Counseled on increased risk of stroke due to Afib and benefits of anticoagulation for stroke prevention; avoidance of NSAIDs due to increased bleeding risk with anticoagulants; -Recommended to continue current medication  BPH (Goal: minimize symptoms of enlarged prostate) -Controlled -Current treatment  tamsulosin 0.4 mg 1 capsule daily - Appropriate, Effective, Safe, Accessible -Medications previously tried: none  -Recommended to continue current medication  Elevated uric acid (Goal: uric acid < 6) -Uncontrolled -Current treatment  Allopurinol 200 mg 1 tablet daily - Appropriate, Query effective, Safe, Accessible -Medications previously tried: none  -Educated on limiting alcohol intake and avoiding foods that can  cause gout flare ups (shellfish, organ meats, soda, etc.); patient currently drinks 4-5 glasses of wine per week  Allergic rhinitis (Goal: minimize symptoms) -Controlled -Current treatment  Fluticasone 50 mcg/act 1  spray in nostril every other day - Appropriate, Effective, Safe, Accessible -Medications previously tried: none  -Recommended to continue current medication Educated on avoidance of allergy triggers  Rosacea (Goal: minimize symptoms) -Controlled -Current treatment  Minocycline 100 mg 1 capsule every other day - Appropriate, Effective, Safe, Accessible -Medications previously tried: none  -Recommended to continue current medication   Health Maintenance -Vaccine gaps: COVID booster -Current therapy:  CoQ10 30 mg 1 capsule every other day - taking same day as simvastatin  Multivitamin 1 tablet every day -Educated on Cost vs benefit of each product must be carefully weighed by individual consumer -Patient is satisfied with current therapy and denies issues -Recommended to continue current medication  Patient Goals/Self-Care Activities Patient will:  - check blood pressure a few times a week, document, and provide at future appointments target a minimum of 150 minutes of moderate intensity exercise weekly  Follow Up Plan: Telephone follow up appointment with care management team member scheduled for: 1 year       Patient verbalizes understanding of instructions and care plan provided today and agrees to view in [place]. Active MyChart status confirmed with patient.   Telephone follow up appointment with pharmacy team member scheduled for: 1 year  Viona Gilmore, [facility]

## 2021-05-24 NOTE — Progress Notes (Signed)
Chronic Care Management Pharmacy Note  05/24/2021 Name:  Gary [name] MRN:  [id number] DOB:  1935/11/24  Summary: Uric acid not at goal < 6  Recommendations/Changes made from today's visit: -Recommend repeat uric acid level -Recommended routine BP monitoring at home  Plan: Follow up BP assessment in 6 months  Subjective: Gary [name] is an 86 y.o. year old male who is a primary patient of Koberlein, Steele Berg, MD.  The CCM team was consulted for assistance with disease management and care coordination needs.    Engaged with patient by telephone for follow up visit in response to provider referral for pharmacy case management and/or care coordination services.   Consent to Services:  The patient was given information about Chronic Care Management services, agreed to services, and gave verbal consent prior to initiation of services.  Please see initial visit note for detailed documentation.   Patient Care Team: Caren Macadam, MD as PCP - General (Family Medicine) Croitoru, Dani Gobble, MD as PCP - Cardiology (Cardiology) Jarome Matin, MD as Consulting Physician (Dermatology) Troy Sine, MD as Consulting Physician (Cardiology) Desma Maxim, MD as Referring Physician (Ophthalmology) Viona Gilmore, [facility] as Pharmacist (Pharmacist)  Recent office visits: 05/20/21 Rolene Arbour, LPN: Patient presented for AWV.  02/02/21 [name], MD: Patient presented for chronic conditions follow up. Increased allopurinol to 200 mg daily.  Recent consult visits: 03/09/21 [name] (ophthalmology): Unable to access notes.  03/01/21 Coletta Memos, MD (cardiology): Patient presented for Aflutter follow up.   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 Received aflibercept injection of eye in office. No medication changes or follow up noted.    10/19/20 Jarome Matin (Dermatology) - seen for history of  malignant neoplasm and other skin related issues. No medication changes or follow up noted.   Hospital visits: 02/27/21 Patient presented to [facility] ED for palpitations.  Objective:  Lab Results  Component Value Date   CREATININE 1.36 (H) 02/27/2021   BUN 42 (H) 02/27/2021   GFR 53.47 (L) 02/14/2021   GFRNONAA 52 (L) 02/27/2021   GFRAA >60 08/16/2017   NA 137 02/27/2021   K 4.6 02/27/2021   CALCIUM 8.8 (L) 02/27/2021   CO2 25 02/27/2021   GLUCOSE 98 02/27/2021    Lab Results  Component Value Date/Time   HGBA1C 6.3 02/02/2021 10:07 AM   HGBA1C 6.0 07/26/2020 09:48 AM   GFR 53.47 (L) 02/14/2021 11:25 AM   GFR 52.48 (L) 02/02/2021 10:07 AM   MICROALBUR 60.1 (H) 04/12/2018 02:11 PM    Last diabetic Eye exam: No results found for: HMDIABEYEEXA  Last diabetic Foot exam: No results found for: HMDIABFOOTEX   Lab Results  Component Value Date   CHOL 143 02/02/2021   HDL 41.20 02/02/2021   LDLCALC 86 02/02/2021   LDLDIRECT 143.5 01/22/2012   TRIG 81.0 02/02/2021   CHOLHDL 3 02/02/2021    Hepatic Function Latest Ref Rng & Units 02/02/2021 07/26/2020 03/08/2020  Total Protein 6.0 - 8.3 g/dL 6.2 6.7 6.4  Albumin 3.5 - 5.2 g/dL 3.9 4.2 -  AST 0 - 37 U/L _0 ALT 0 - 53 U/L _1 Alk Phosphatase 39 - 117 U/L 53 70 -  Total Bilirubin 0.2 - 1.2 mg/dL 0.8 1.0 0.6  Bilirubin, Direct 0.0 - 0.3 mg/dL - - -    Lab Results  Component Value Date/Time   TSH 2.19 06/26/2017  10:23 AM   TSH 2.58 06/07/2016 08:20 AM    CBC Latest Ref Rng & Units 02/27/2021 02/02/2021 07/26/2020  WBC 4.0 - 10.5 K/uL 6.3 5.4 6.8  Hemoglobin 13.0 - 17.0 g/dL 15.1 14.4 15.6  Hematocrit 39.0 - 52.0 % 44.9 43.0 46.2  Platelets 150 - 400 K/uL 138(L) 142.0(L) 136.0(L)    No results found for: VD25OH  Clinical ASCVD: No  The ASCVD Risk score (Arnett DK, et al., 2019) failed to calculate for the following reasons:   The 2019 ASCVD risk score is only valid for ages 42 to 50     Depression screen PHQ 2/9 05/20/2021 06/04/2020 07/14/2019  Decreased Interest 0 0 0  Down, Depressed, Hopeless 0 0 0  PHQ - 2 Score 0 0 0  Altered sleeping - 0 -  Tired, decreased energy - 0 -  Change in appetite - 0 -  Feeling bad or failure about yourself  - 0 -  Trouble concentrating - 0 -  Moving slowly or fidgety/restless - 0 -  Suicidal thoughts - 0 -  PHQ-9 Score - 0 -  Difficult doing work/chores - Not difficult at all -     CHA2DS2/VAS Stroke Risk Points  Current as of a minute ago     4 >= 2 Points: High Risk  1 - 1.99 Points: Medium Risk  0 Points: Low Risk    Last Change: N/A      Details    This score determines the patient's risk of having a stroke if the  patient has atrial fibrillation.       Points Metrics  0 Has Congestive Heart Failure:  No    Current as of a minute ago  0 Has Vascular Disease:  No    Current as of a minute ago  1 Has Hypertension:  Yes    Current as of a minute ago  2 Age:  72    Current as of a minute ago  1 Has Diabetes:  Yes    Current as of a minute ago  0 Had Stroke:  No  Had TIA:  No  Had Thromboembolism:  No    Current as of a minute ago  0 Male:  No    Current as of a minute ago     Social History   Tobacco Use  Smoking Status Former  Smokeless Tobacco Never   BP Readings from Last 3 Encounters:  03/01/21 140/80  02/27/21 (!) 172/90  02/02/21 124/70   Pulse Readings from Last 3 Encounters:  03/01/21 (!) 57  02/27/21 65  02/02/21 61   Wt Readings from Last 3 Encounters:  05/20/21 251 lb (113.9 kg)  03/01/21 251 lb 6.4 oz (114 kg)  02/27/21 245 lb (111.1 kg)   BMI Readings from Last 3 Encounters:  05/20/21 31.37 kg/m  03/01/21 31.42 kg/m  02/27/21 30.62 kg/m    Assessment/Interventions: Review of patient past medical history, allergies, medications, health status, including review of consultants reports, laboratory and other test data, was performed as part of comprehensive evaluation and provision  of chronic care management services.   SDOH:  (Social Determinants of Health) assessments and interventions performed: No  SDOH Screenings   Alcohol Screen: Low Risk    Last Alcohol Screening Score (AUDIT): 4  Depression (PHQ2-9): Low Risk    PHQ-2 Score: 0  Financial Resource Strain: Low Risk    Difficulty of Paying Living Expenses: Not hard at all  Food Insecurity: No Food Insecurity  Worried About Charity fundraiser in the Last Year: Never true   Ran Out of Food in the Last Year: Never true  Housing: Low Risk    Last Housing Risk Score: 0  Physical Activity: Insufficiently Active   Days of Exercise per Week: 2 days   Minutes of Exercise per Session: 30 min  Social Connections: Engineer, building services of Communication with Friends and Family: More than three times a week   Frequency of Social Gatherings with Friends and Family: More than three times a week   Attends Religious Services: More than 4 times per year   Active Member of [employer] or Organizations: Yes   Attends Music therapist: More than 4 times per year   Marital Status: Married  Stress: No Stress Concern Present   Feeling of Stress : Not at all  Tobacco Use: Medium Risk   Smoking Tobacco Use: Former   Smokeless Tobacco Use: Never   Passive Exposure: Not on Pensions consultant Needs: No Transportation Needs   Lack of Transportation (Medical): No   Lack of Transportation (Non-Medical): No    CCM Care Plan  No Known Allergies  Medications Reviewed Today     Reviewed by Criselda Peaches, LPN (Licensed Practical Nurse) on 05/20/21 at [place] List Status: <None>   Medication Order Taking? Sig Documenting Provider Last Dose Status Informant  0.9 %  sodium chloride infusion [id number]   Ladene Artist, MD  Active   allopurinol (ZYLOPRIM) 100 MG tablet [id number]  TAKE 2 TABLETS EVERY DAY Koberlein, Junell C, MD  Active   co-enzyme Q-10 30 MG capsule [id number] No Take 30 mg by mouth every  other day.  [provider] Taking Active Self  ELIQUIS 5 MG TABS tablet [id number] No TAKE 1 TABLET TWICE DAILY Croitoru, Mihai, MD Taking Active   fluticasone (FLONASE) 50 MCG/ACT nasal spray [id number]  PLACE 1 SPRAY INTO BOTH NOSTRILS EVERY OTHER DAY. Caren Macadam, MD  Active   lisinopril (ZESTRIL) 5 MG tablet [id number] No Take 2 tablets (10 mg total) by mouth daily. Troy Sine, MD Taking Active   minocycline (MINOCIN,DYNACIN) 100 MG capsule [id number] No Take 100 mg by mouth every other day.  [provider] Taking Active   Multiple Vitamin (MULTIVITAMIN) tablet [id number] No Take 1 tablet by mouth daily. [provider] Taking Active Self  simvastatin (ZOCOR) 20 MG tablet [id number] No Take 1 tablet (20 mg total) by mouth every other day. Caren Macadam, MD Taking Active   tamsulosin (FLOMAX) 0.4 MG CAPS capsule [id number]  TAKE 1 CAPSULE EVERY DAY Caren Macadam, MD  Active             Patient Active Problem List   Diagnosis Date Noted   Rosacea 07/09/2018   Impaired fasting glucose 07/09/2018   S/P total knee replacement 08/27/2017   Essential hypertension 04/05/2016   Obstructive sleep apnea 01/01/2015   Periodic limb movement 01/01/2015   Typical atrial flutter ([place])    Cardiac arrhythmia 05/14/2014   BPH associated with nocturia 05/12/2014   Calculus of parotid gland 09/02/2013   Murmur, heart 01/22/2012   Aortic stenosis 12/20/2010   LIBIDO, DECREASED 10/19/2009   Hypercholesterolemia 10/03/2007   ERECTILE DYSFUNCTION 10/03/2007   ALLERGIC RHINITIS 10/03/2007    Immunization History  Administered Date(s) Administered   Fluad Quad(high Dose 65+) 01/17/2019, 01/26/2020, 02/02/2021   Influenza Split 01/22/2012   Influenza, High Dose Seasonal PF 03/13/2014, 05/31/2015, 03/14/2016,  03/18/2018   Influenza,inj,Quad PF,6+ Mos 04/15/2013   PFIZER(Purple Top)SARS-COV-2 Vaccination 06/02/2019, 06/29/2019   PNEUMOCOCCAL CONJUGATE-20  02/02/2021   Pneumococcal Conjugate-13 04/15/2013, 05/12/2014   Pneumococcal Polysaccharide-23 08/19/2004, 10/19/2009   Td 08/19/2004   Tdap 05/13/2016   Zoster Recombinat (Shingrix) 10/26/2019, 01/12/2020   Patient is waiting for cardiologist appointment and he hasn't been able to schedule with cardiologist until April. He hasn't had any repeat episodes of palpitations but was hoping to see him sooner. He is on the cancellation list.  Patient denies any recent fatigue or dizziness. Patient went to emergency room for palpitations and HR was elevated at 90.  Conditions to be addressed/monitored:  Hypertension, Hyperlipidemia, Atrial Fibrillation, BPH, Gout, Allergic Rhinitis and Rosacea  Conditions addressed this visit: Hypertension, Afib, Gout  Care Plan : CCM Pharmacy Care Plan  Updates made by Viona Gilmore, [place] since 05/24/2021 12:00 AM     Problem: Problem: Hypertension, Hyperlipidemia, Atrial Fibrillation, BPH, Gout, Allergic Rhinitis and Rosacea      Long-Range Goal: Patient-Specific Goal   Start Date: 09/08/2020  Expected End Date: 09/08/2021  Recent Progress: On track  Priority: High  Note:   Current Barriers:  Unable to independently monitor therapeutic efficacy  Pharmacist Clinical Goal(s):  Patient will achieve adherence to monitoring guidelines and medication adherence to achieve therapeutic efficacy through collaboration with PharmD and provider.   Interventions: 1:1 collaboration with Caren Macadam, MD regarding development and update of comprehensive plan of care as evidenced by provider attestation and co-signature Inter-disciplinary care team collaboration (see longitudinal plan of care) Comprehensive medication review performed; medication list updated in electronic medical record  Hypertension (BP goal <140/90) -Controlled (per home readings) -Current treatment: Lisinopril 5 mg 1.5 tablets daily - Appropriate, Effective, Safe,  Accessible -Medications previously tried: none -Current home readings: 120/60-70s (trying to check every day or every 2-3 days) -Current dietary habits: limiting added salt and switching out for a salt substitute if possible -Current exercise habits: works out with a trainer twice a week; working with the trainer -Denies hypotensive/hypertensive symptoms other than orthostatic hypotension -Educated on Exercise goal of 150 minutes per week; Importance of home blood pressure monitoring; Proper BP monitoring technique; -Counseled to monitor BP at home a few times a week, document, and provide log at future appointments -Counseled on diet and exercise extensively Recommended to continue current medication Plan to repeat blood pressure assessment in a few weeks  Hyperlipidemia: (LDL goal < 70) -Controlled -Current treatment: Simvastatin 20 mg 1 tablet every other day - Appropriate, Effective, Safe, Accessible -Medications previously tried: none  -Current dietary patterns: eats out a few days a week -Current exercise habits: working out with a Physiological scientist -Educated on Cholesterol goals;  Benefits of statin for ASCVD risk reduction; Importance of limiting foods high in cholesterol; Exercise goal of 150 minutes per week; -Counseled on diet and exercise extensively Recommended to continue current medication Recommended getting an updated prescription for every other day.  Atrial Flutter (Goal: prevent stroke and major bleeding) -Controlled -CHADSVASC: 3 -Current treatment: Rate control: none Anticoagulation: Eliquis 5 mg 1 tablet twice daily - Appropriate, Effective, Safe, Accessible -Medications previously tried: none -Home BP and HR readings: 54-55 -Counseled on increased risk of stroke due to Afib and benefits of anticoagulation for stroke prevention; avoidance of NSAIDs due to increased bleeding risk with anticoagulants; -Recommended to continue current medication  BPH (Goal:  minimize symptoms of enlarged prostate) -Controlled -Current treatment  tamsulosin 0.4 mg 1 capsule daily - Appropriate, Effective, Safe,  Accessible -Medications previously tried: none  -Recommended to continue current medication  Elevated uric acid (Goal: uric acid < 6) -Uncontrolled -Current treatment  Allopurinol 200 mg 1 tablet daily - Appropriate, Query effective, Safe, Accessible -Medications previously tried: none  -Educated on limiting alcohol intake and avoiding foods that can cause gout flare ups (shellfish, organ meats, soda, etc.); patient currently drinks 4-5 glasses of wine per week  Allergic rhinitis (Goal: minimize symptoms) -Controlled -Current treatment  Fluticasone 50 mcg/act 1 spray in nostril every other day - Appropriate, Effective, Safe, Accessible -Medications previously tried: none  -Recommended to continue current medication Educated on avoidance of allergy triggers  Rosacea (Goal: minimize symptoms) -Controlled -Current treatment  Minocycline 100 mg 1 capsule every other day - Appropriate, Effective, Safe, Accessible -Medications previously tried: none  -Recommended to continue current medication   Health Maintenance -Vaccine gaps: COVID booster -Current therapy:  CoQ10 30 mg 1 capsule every other day - taking same day as simvastatin  Multivitamin 1 tablet every day -Educated on Cost vs benefit of each product must be carefully weighed by individual consumer -Patient is satisfied with current therapy and denies issues -Recommended to continue current medication  Patient Goals/Self-Care Activities Patient will:  - check blood pressure a few times a week, document, and provide at future appointments target a minimum of 150 minutes of moderate intensity exercise weekly  Follow Up Plan: Telephone follow up appointment with care management team member scheduled for: 1 year      Medication Assistance: None required.  Patient affirms current  coverage meets needs.  Compliance/Adherence/Medication fill history: Care Gaps: Last BP - 140/80 on 03/01/2021  Star-Rating Drugs: Lisinopril 81m - [id number]: [phone]: [phone] pill box? No - drawer full of medicines - takes medicines after breakfast and at tonight Pt endorses 90% compliance  We discussed: Current pharmacy is preferred with insurance plan and patient is satisfied with pharmacy services Patient decided to: Continue current medication management strategy  Care Plan and Follow Up Patient Decision:  Patient agrees to Care Plan and Follow-up.  Plan: Telephone follow up appointment with care management team member scheduled for:  1 year  MaJeni SallesPharmD BCNorth Riverharmacist LeOccidental Petroleumt [phone]

## 2021-05-31 DIAGNOSIS — I483 Typical atrial flutter: Secondary | ICD-10-CM | POA: Diagnosis not present

## 2021-05-31 DIAGNOSIS — I10 Essential (primary) hypertension: Secondary | ICD-10-CM

## 2021-06-02 ENCOUNTER — Encounter: Payer: Self-pay | Admitting: Cardiovascular Disease

## 2021-06-02 ENCOUNTER — Other Ambulatory Visit: Payer: Self-pay

## 2021-06-02 ENCOUNTER — Ambulatory Visit: Payer: Medicare HMO | Admitting: Cardiovascular Disease

## 2021-06-02 VITALS — BP 122/58 | HR 50 | Ht 75.0 in | Wt 248.2 lb

## 2021-06-02 DIAGNOSIS — I10 Essential (primary) hypertension: Secondary | ICD-10-CM

## 2021-06-02 DIAGNOSIS — I483 Typical atrial flutter: Secondary | ICD-10-CM

## 2021-06-02 DIAGNOSIS — E7800 Pure hypercholesterolemia, unspecified: Secondary | ICD-10-CM | POA: Diagnosis not present

## 2021-06-02 DIAGNOSIS — G4733 Obstructive sleep apnea (adult) (pediatric): Secondary | ICD-10-CM | POA: Diagnosis not present

## 2021-06-02 NOTE — Patient Instructions (Signed)

## 2021-06-02 NOTE — Progress Notes (Signed)
Cardiology Clinic Note   Patient Name: Gary [name] Date of Encounter: 06/02/2021  Primary Care Provider:  Caren Macadam, MD Primary Cardiologist:  [name], MD  Chief Complaint  Patient presents with   Dizziness           Gary [name] 86 year old male presents to the clinic today for follow-up evaluation of his essential hypertension, and atrial flutter.  Additional medical problems include obstructive sleep apnea, compliant with CPAP and essential hypertension.  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   CARDIAC MURMUR, AORTIC 10/03/2007   ERECTILE DYSFUNCTION 10/03/2007   High blood pressure 03/2016   HYPERLIPIDEMIA 10/03/2007   Rosacea    Sleep apnea    Past Surgical History:  Procedure Laterality Date   ADENOIDECTOMY     CARDIOVERSION N/A 07/01/2014   Procedure: CARDIOVERSION;  Surgeon: [name], MD;  Location: [facility];  Service: Cardiovascular;  Laterality: N/A;   COLONOSCOPY     PALATE SURGERY     TEE WITHOUT CARDIOVERSION N/A 07/01/2014   Procedure: TRANSESOPHAGEAL ECHOCARDIOGRAM (TEE);  Surgeon: [name], MD;  Location: [facility];  Service: Cardiovascular;  Laterality: N/A;   TONSILLECTOMY     TOTAL KNEE ARTHROPLASTY Right 08/27/2017   Procedure: RIGHT TOTAL KNEE ARTHROPLASTY;  Surgeon: Vickey Huger, MD;  Location: [facility];  Service: Orthopedics;  Laterality: Right;    Allergies  No Known Allergies  History of Present Illness    Gary [name] has a PMH of cardiac murmur, aortic stenosis,  He has longstanding asymptomatic atrial flutter with spontaneously controlled ventricular rate.  He was seen several months ago after a single episode of lightheadedness that occurred on a Sunday  morning at church.  This has not happened since, although he has occasional very brief lightheadedness when he bends over and stands up too quickly.  He has not had syncope or falls.  He denies any bleeding problems on Eliquis.  He has not had any focal neurological events.  Occasionally he notices a little lightheadedness if he overexerts: For example if he climbs 2 flights of stairs quickly.  He may have to stop for a second and lean against the wall.  His diastolic blood pressure is 58 mmHg today, but this is unusually low for him.  Usually is in the 60s.  His typical systolic blood pressure is in the 120s-130s and it is occasionally in the low 140s.  In the past, we had to increase his dose of lisinopril gradually to achieve good blood pressure control.  On the day when he was dizzy, his heart rate was faster than usual at about 80-100 bpm.  His typical heart rate is in the 50s.  The patient specifically denies any chest pain at rest or with exertion, dyspnea at rest or with exertion, orthopnea, paroxysmal nocturnal dyspnea, syncope, palpitations, focal neurological deficits, intermittent claudication, lower extremity edema, unexplained weight gain, cough, hemoptysis or wheezing. atrial flutter, HTN, HLD, BPH, ED, and allergic rhinitis.  Home Medications    Prior to Admission medications   Medication Sig Start Date End Date Taking? Authorizing Provider  allopurinol (ZYLOPRIM) 100 MG tablet Take 2 tablets (200 mg total) by mouth  daily. 02/07/21   Caren Macadam, MD  co-enzyme Q-10 30 MG capsule Take 30 mg by mouth every other day.     [provider]  ELIQUIS 5 MG TABS tablet TAKE 1 TABLET TWICE DAILY 01/17/21   Chayah Mckee, MD  fluticasone (FLONASE) 50 MCG/ACT nasal spray Place 1 spray into both nostrils every other day. 02/16/20   Koberlein, Steele Berg, MD  lisinopril (ZESTRIL) 5 MG tablet Take 2 tablets (10 mg total) by mouth daily. 09/16/20   Troy Sine, MD  minocycline  (MINOCIN,DYNACIN) 100 MG capsule Take 100 mg by mouth every other day.     [provider]  Multiple Vitamin (MULTIVITAMIN) tablet Take 1 tablet by mouth daily.    [provider]  simvastatin (ZOCOR) 20 MG tablet Take 1 tablet (20 mg total) by mouth every other day. 09/16/20   Caren Macadam, MD  tamsulosin (FLOMAX) 0.4 MG CAPS capsule TAKE 1 CAPSULE EVERY DAY 03/23/20   Caren Macadam, MD    Family History    Family History  Problem Relation Age of Onset   Diabetes Mother    Cancer Father        liver ca   Liver cancer Father    Hypotension Sister    CAD Brother    Stomach cancer Paternal Aunt    He indicated that his mother is deceased. He indicated that his father is deceased. He indicated that his sister is alive. He indicated that his brother is alive. He indicated that his maternal grandmother is deceased. He indicated that his maternal grandfather is deceased. He indicated that his paternal grandmother is deceased. He indicated that his paternal grandfather is deceased. He indicated that the status of his paternal aunt is unknown.   Social History    Social History   Socioeconomic History   Marital status: Married    Spouse name: Not on file   Number of children: 2   Years of education: Not on file   Highest education level: Not on file  Occupational History   Occupation: IT trainer company    Comment: retired  Tobacco Use   Smoking status: Former   Smokeless tobacco: Never  Scientific laboratory technician Use: Never used  Substance and Sexual Activity   Alcohol use: Yes    Alcohol/week: 8.0 standard drinks    Types: 8 Glasses of wine per week    Comment: ocassionally    Drug use: No   Sexual activity: Not on file  Other Topics Concern   Not on file  Social History Narrative   Lives with wife in two story house, but occupies main level mostly   Has 4 children, two biological, two stepchildren, 9 grandkids   One son local, supportive     Active playing golf with friends, goes to Physiological scientist 2 days/week at [employer]   Enjoys reading, soduku   Social Determinants of Health   Financial Resource Strain: Low Risk    Difficulty of Paying Living Expenses: Not hard at all  Food Insecurity: No Food Insecurity   Worried About Charity fundraiser in the Last Year: Never true   Arboriculturist in the Last Year: Never true  Transportation Needs: No Transportation Needs   Lack of Transportation (Medical): No   Lack of Transportation (Non-Medical): No  Physical Activity: Insufficiently Active   Days of Exercise per Week: 2 days   Minutes of Exercise per Session: 30 min  Stress: No Stress Concern Present   Feeling of Stress : Not at all  Social Connections: Socially Integrated   Frequency of Communication with Friends and Family: More than three times a week   Frequency of Social Gatherings with Friends and Family: More than three times a week   Attends Religious Services: More than 4 times per year   Active Member of [employer] or Organizations: Yes   Attends Music therapist: More than 4 times per year   Marital Status: Married  Human resources officer Violence: Not At Risk   Fear of Current or Ex-Partner: No   Emotionally Abused: No   Physically Abused: No   Sexually Abused: No     Review of Systems    General:  No chills, fever, night sweats or weight changes.  Cardiovascular:  No chest pain, dyspnea on exertion, edema, orthopnea, palpitations, paroxysmal nocturnal dyspnea. Dermatological: No rash, lesions/masses Respiratory: No cough, dyspnea Urologic: No hematuria, dysuria Abdominal:   No nausea, vomiting, diarrhea, bright red blood per rectum, melena, or hematemesis Neurologic:  No visual changes, wkns, changes in mental status. All other systems reviewed and are otherwise negative except as noted above.  Physical Exam    VS:  BP (!) 122/58    Pulse (!) 50    Ht 6\' 3"  (1.905 m)    Wt 248 lb 3.2 oz  (112.6 kg)    SpO2 99%    BMI 31.02 kg/m  , BMI Body mass index is 31.02 kg/m.  General: Alert, oriented x3, no distress, appears well, mildly obese. Head: no evidence of trauma, PERRL, EOMI, no exophtalmos or lid lag, no myxedema, no xanthelasma; normal ears, nose and oropharynx Neck: normal jugular venous pulsations and no hepatojugular reflux; brisk carotid pulses without delay and no carotid bruits Chest: clear to auscultation, no signs of consolidation by percussion or palpation, normal fremitus, symmetrical and full respiratory excursions Cardiovascular: normal position and quality of the apical impulse, irregular rhythm, normal first and second heart sounds, no murmurs, rubs or gallops Abdomen: no tenderness or distention, no masses by palpation, no abnormal pulsatility or arterial bruits, normal bowel sounds, no hepatosplenomegaly Extremities: no clubbing, cyanosis or edema; 2+ radial, ulnar and brachial pulses bilaterally; 2+ right femoral, posterior tibial and dorsalis pedis pulses; 2+ left femoral, posterior tibial and dorsalis pedis pulses; no subclavian or femoral bruits Neurological: grossly nonfocal Psych: Normal mood and affect   Accessory Clinical Findings    Recent Labs: 02/02/2021: ALT 17 02/27/2021: BUN 42; Creatinine, Ser 1.36; Hemoglobin 15.1; Magnesium 1.9; Platelets 138; Potassium 4.6; Sodium 137   Recent Lipid Panel    Component Value Date/Time   CHOL 143 02/02/2021 1007   TRIG 81.0 02/02/2021 1007   HDL 41.20 02/02/2021 1007   CHOLHDL 3 02/02/2021 1007   VLDL 16.2 02/02/2021 1007   LDLCALC 86 02/02/2021 1007   LDLCALC 83 01/26/2020 0913   LDLDIRECT 143.5 01/22/2012 0916    ECG personally reviewed by me today-atrial flutter 57 bpm  Echocardiogram 07/01/2014  Study Conclusions   - Left ventricle: Systolic function was normal. The estimated    ejection fraction was in the range of 55% to 60%. Wall motion was    normal; there were no regional wall motion  abnormalities.  - Aortic valve: There was trivial regurgitation.  - Mitral valve: There was mild regurgitation.  - Left atrium: No evidence of thrombus in the atrial cavity or    appendage. The appendage was morphologically a left appendage,  multilobulated, and of normal size. Emptying velocity was mildly    reduced.  - Right atrium: No evidence of thrombus in the atrial cavity or    appendage.    Assessment & Plan   1. Typical atrial flutter ([place])   2. Essential hypertension   3. OSA (obstructive sleep apnea)   4. Hypercholesterolemia      1.  Atrial flutter: This is spontaneously rate control suggesting he may be at risk for bradycardia with worsening age-related AV block.  Bradycardia has not been documented and at the time of his dizzy spell his heart was actually faster than usual.  If he does develop significant bradycardia he will need a pacemaker, but this does not appear to be imminent.  Asked him to call for episodes of worsening dizziness or syncope.[name] CHADSVasc 3 (age, HTN).  Appropriately anticoagulated Eliquis: No bleeding complications. OSA: Denies daytime hypersomnolence.  Compliant with CPAP. HLP: LDL in target range.  HDL chronically low. HTN: Borderline low diastolic blood pressure today, but usually in target range.  No changes made to his medications.  Some symptoms of orthostatic hypotension and may have to decrease his lisinopril in the future.   02/02/2021: Cholesterol 143; HDL 41.20; LDL Cholesterol 86; Triglycerides 81.0; VLDL 16.2   Patient Instructions  Medication Instructions:  No changes *If you need a refill on your cardiac medications before your next appointment, please call your pharmacy*   Lab Work: None ordered If you have labs (blood work) drawn today and your tests are completely normal, you will receive your results only by: [place] (if you have MyChart) OR A paper copy in the mail If you have any lab test that is abnormal or we  need to change your treatment, we will call you to review the results.   Testing/Procedures: None ordered   Follow-Up: At [facility], you and your health needs are our priority.  As part of our continuing mission to provide you with exceptional heart care, we have created designated Provider Care Teams.  These Care Teams include your primary Cardiologist (physician) and Advanced Practice Providers (APPs -  Physician Assistants and Nurse Practitioners) who all work together to provide you with the care you need, when you need it.  We recommend signing up for the patient portal called "MyChart".  Sign up information is provided on this After Visit Summary.  MyChart is used to connect with patients for Virtual Visits (Telemedicine).  Patients are able to view lab/test results, encounter notes, upcoming appointments, etc.  Non-urgent messages can be sent to your provider as well.   To learn more about what you can do with MyChart, go to [email or web address].    Your next appointment:   12 month(s)  The format for your next appointment:   In Person  Provider:   [name], MD      [name], MD, [facility] [phone] office [phone] pager

## 2021-06-09 DIAGNOSIS — H35352 Cystoid macular degeneration, left eye: Secondary | ICD-10-CM | POA: Diagnosis not present

## 2021-06-30 DIAGNOSIS — H35361 Drusen (degenerative) of macula, right eye: Secondary | ICD-10-CM | POA: Diagnosis not present

## 2021-06-30 DIAGNOSIS — H35033 Hypertensive retinopathy, bilateral: Secondary | ICD-10-CM | POA: Diagnosis not present

## 2021-06-30 DIAGNOSIS — H34832 Tributary (branch) retinal vein occlusion, left eye, with macular edema: Secondary | ICD-10-CM | POA: Diagnosis not present

## 2021-06-30 DIAGNOSIS — H43813 Vitreous degeneration, bilateral: Secondary | ICD-10-CM | POA: Diagnosis not present

## 2021-07-27 ENCOUNTER — Telehealth: Payer: Self-pay | Admitting: Family Medicine

## 2021-07-27 NOTE — Telephone Encounter (Signed)
Patient's wife is asking if patient can do a TOC from [place] to Dr.Burchette. ? ? ? ? ? ?Please advise  ?

## 2021-07-27 NOTE — Telephone Encounter (Signed)
ok 

## 2021-07-28 NOTE — Telephone Encounter (Signed)
Spoke with the patient's wife and scheduled an appt on 4/24 to arrive at 2:15pm. ?

## 2021-08-03 ENCOUNTER — Ambulatory Visit: Payer: Medicare HMO | Admitting: Cardiovascular Disease

## 2021-08-15 ENCOUNTER — Other Ambulatory Visit: Payer: Self-pay | Admitting: Cardiovascular Disease

## 2021-08-22 ENCOUNTER — Encounter: Payer: Self-pay | Admitting: Family Medicine

## 2021-08-22 ENCOUNTER — Ambulatory Visit (INDEPENDENT_AMBULATORY_CARE_PROVIDER_SITE_OTHER): Payer: Medicare HMO | Admitting: Family Medicine

## 2021-08-22 VITALS — BP 136/60 | HR 70 | Temp 97.8°F | Ht 75.0 in | Wt 249.2 lb

## 2021-08-22 DIAGNOSIS — R351 Nocturia: Secondary | ICD-10-CM

## 2021-08-22 DIAGNOSIS — N401 Enlarged prostate with lower urinary tract symptoms: Secondary | ICD-10-CM

## 2021-08-22 DIAGNOSIS — L719 Rosacea, unspecified: Secondary | ICD-10-CM | POA: Diagnosis not present

## 2021-08-22 DIAGNOSIS — R7301 Impaired fasting glucose: Secondary | ICD-10-CM

## 2021-08-22 DIAGNOSIS — I483 Typical atrial flutter: Secondary | ICD-10-CM | POA: Diagnosis not present

## 2021-08-22 DIAGNOSIS — I10 Essential (primary) hypertension: Secondary | ICD-10-CM

## 2021-08-22 NOTE — Progress Notes (Signed)
?Subjective:  ?  ? Patient ID: Gary [name], male   DOB: 1936/04/22, 86 y.o.   MRN: [id number] ? ?HPI ? ?Here for transition of care.  Has seen Dr Ethlyn Gallery in the past and she will be leaving our practice.  Patient's history reviewed.  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  Recent labs reviewed.  His last A1c was 6.3%.  He states that his blood pressures at home have been consistently well controlled.  He is on lisinopril 10 mg daily.  Also takes Eliquis.  Has had bradycardia in the past and thus is not on beta-blocker for his A-fib.  He takes simvastatin for hyperlipidemia.  BPH controlled with Flomax. ? ?Generally feels well.  No appetite or weight changes.  No dyspnea.  No chest pains.  He is dealing with the stress that his wife of 58 years recently diagnosed with lung cancer. ? ?Past Medical History:  ?Diagnosis Date  ? Adenomatous colon polyp 06/2000  ? ALLERGIC RHINITIS 10/03/2007  ? Allergy   ? seasonal  ? Arthritis   ? knees  ? Atrial flutter ([place]) 06/26/2014  ? Cancer [facility])   ? 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 CARDIAC MURMUR, AORTIC 10/03/2007  ? ERECTILE DYSFUNCTION 10/03/2007  ? High blood pressure 03/2016  ? HYPERLIPIDEMIA 10/03/2007  ? Rosacea   ? Sleep apnea   ? ?Past Surgical History:  ?Procedure Laterality Date  ? ADENOIDECTOMY    ? CARDIOVERSION N/A 07/01/2014  ? Procedure: CARDIOVERSION;  Surgeon: [name], MD;  Location: [facility];  Service: Cardiovascular;  Laterality: N/A;  ? COLONOSCOPY    ? PALATE SURGERY    ? TEE WITHOUT CARDIOVERSION N/A 07/01/2014  ? Procedure: TRANSESOPHAGEAL ECHOCARDIOGRAM (TEE);  Surgeon: [name], MD;  [facility] ENDOSCOPY;  Service: Cardiovascular;  Laterality: N/A;  ? TONSILLECTOMY    ? TOTAL KNEE ARTHROPLASTY Right 08/27/2017  ? Procedure: RIGHT TOTAL KNEE ARTHROPLASTY;  Surgeon:  Vickey Huger, MD;  Location: [facility] [place];  Service: Orthopedics;  Laterality: Right;  ? ? reports that he has quit smoking. He has never used smokeless tobacco. He reports current alcohol use of about 8.0 standard drinks per week. He reports that he does not use drugs. ?family history includes CAD in his brother; Cancer in his father; Diabetes in his mother; Hypotension in his sister; Liver cancer in his father; Stomach cancer in his paternal aunt. ?No Known Allergies ? ? ?Review of Systems  ?Constitutional:  Negative for fatigue.  ?Eyes:  Negative for visual disturbance.  ?Respiratory:  Negative for cough, chest tightness and shortness of breath.   ?Cardiovascular:  Negative for chest pain, palpitations and leg swelling.  ?Neurological:  Negative for dizziness, syncope, weakness, light-headedness and headaches.  ? ?   ?Objective:  ? Physical Exam ?Constitutional:   ?   Appearance: He is well-developed.  ?HENT:  ?   Right Ear: External ear normal.  ?   Left Ear: External ear normal.  ?Eyes:  ?   Pupils: Pupils are equal, round, and reactive to light.  ?Neck:  ?   Thyroid: No thyromegaly.  ?Cardiovascular:  ?   Rate and Rhythm: Normal rate.  ?Pulmonary:  ?   Effort: Pulmonary effort is normal. No respiratory distress.  ?   Breath sounds: Normal breath sounds. No wheezing or rales.  ?Musculoskeletal:  ?   Cervical  back: Neck supple.  ?   Right lower leg: No edema.  ?   Left lower leg: No edema.  ?Neurological:  ?   Mental Status: He is alert and oriented to person, place, and time.  ? ? ?   ?Assessment:  ?   ?#1 atrial fibrillation.  Rate controlled.  Patient on Eliquis. ? ?#2 hypertension stable by multiple home readings.  Repeat here today after rest improved. ? ?#3 history of rosacea treated by dermatology with low-dose minocycline ? ?#4 obstructive sleep apnea treated with CPAP ? ?#5 history of BPH symptomatically stable on Flomax 0.4 mg nightly ? ?#6 prediabetes with last A1c 6.3% ? ?#7 gout stable on allopurinol 200  mg daily ?   ?Plan:  ?   ?-Continue current medications including lisinopril 5 mg 2 tablets daily, allopurinol 100 mg 2 tablets daily, tamsulosin 0.4 mg 1 daily, Eliquis 5 mg twice daily, and simvastatin 20 mg daily. ?-Plan 31-monthfollow-up and get follow-up labs at that time ?-Follow-up sooner as needed ? ?BEulas PostMD ?[facility]? ?   ?

## 2021-08-24 DIAGNOSIS — Z85828 Personal history of other malignant neoplasm of skin: Secondary | ICD-10-CM | POA: Diagnosis not present

## 2021-08-24 DIAGNOSIS — C44629 Squamous cell carcinoma of skin of left upper limb, including shoulder: Secondary | ICD-10-CM | POA: Diagnosis not present

## 2021-08-24 DIAGNOSIS — D485 Neoplasm of uncertain behavior of skin: Secondary | ICD-10-CM | POA: Diagnosis not present

## 2021-08-24 DIAGNOSIS — C44622 Squamous cell carcinoma of skin of right upper limb, including shoulder: Secondary | ICD-10-CM | POA: Diagnosis not present

## 2021-09-06 ENCOUNTER — Other Ambulatory Visit: Payer: Self-pay | Admitting: Cardiovascular Disease

## 2021-09-06 ENCOUNTER — Other Ambulatory Visit: Payer: Self-pay | Admitting: Family Medicine

## 2021-09-07 NOTE — Telephone Encounter (Signed)
Prescription refill request for Eliquis received. ?Indication:Aflutter ?Last office visit:2/23 ?Scr:1.3 ?Age: 86 ?Weight:113 kg ? ?Prescription refilled ? ?

## 2021-10-20 ENCOUNTER — Other Ambulatory Visit: Payer: Self-pay

## 2021-10-20 DIAGNOSIS — H4323 Crystalline deposits in vitreous body, bilateral: Secondary | ICD-10-CM | POA: Diagnosis not present

## 2021-10-20 DIAGNOSIS — H43813 Vitreous degeneration, bilateral: Secondary | ICD-10-CM | POA: Diagnosis not present

## 2021-10-20 DIAGNOSIS — N400 Enlarged prostate without lower urinary tract symptoms: Secondary | ICD-10-CM

## 2021-10-20 DIAGNOSIS — H35361 Drusen (degenerative) of macula, right eye: Secondary | ICD-10-CM | POA: Diagnosis not present

## 2021-10-20 DIAGNOSIS — H34832 Tributary (branch) retinal vein occlusion, left eye, with macular edema: Secondary | ICD-10-CM | POA: Diagnosis not present

## 2021-10-20 MED ORDER — TAMSULOSIN HCL 0.4 MG PO CAPS: 0.4000 mg | ORAL_CAPSULE | Freq: Every day | ORAL | 3 refills | Status: DC

## 2021-11-05 DIAGNOSIS — I10 Essential (primary) hypertension: Secondary | ICD-10-CM | POA: Diagnosis not present

## 2021-11-05 DIAGNOSIS — I700 Atherosclerosis of aorta: Secondary | ICD-10-CM | POA: Diagnosis not present

## 2021-11-05 DIAGNOSIS — Z87891 Personal history of nicotine dependence: Secondary | ICD-10-CM | POA: Diagnosis not present

## 2021-11-05 DIAGNOSIS — Z7901 Long term (current) use of anticoagulants: Secondary | ICD-10-CM | POA: Diagnosis not present

## 2021-11-07 ENCOUNTER — Telehealth: Payer: Self-pay | Admitting: Cardiovascular Disease

## 2021-11-07 ENCOUNTER — Other Ambulatory Visit: Payer: Self-pay | Admitting: Family Medicine

## 2021-11-07 ENCOUNTER — Other Ambulatory Visit: Payer: Self-pay

## 2021-11-07 DIAGNOSIS — I483 Typical atrial flutter: Secondary | ICD-10-CM

## 2021-11-07 MED ORDER — ALLOPURINOL 100 MG PO TABS: 200.0000 mg | ORAL_TABLET | Freq: Every day | ORAL | 0 refills | Status: DC

## 2021-11-07 MED ORDER — APIXABAN 5 MG PO TABS: 5.0000 mg | ORAL_TABLET | Freq: Two times a day (BID) | ORAL | 0 refills | Status: DC

## 2021-11-07 MED ORDER — LISINOPRIL 5 MG PO TABS: 10.0000 mg | ORAL_TABLET | Freq: Every day | ORAL | 0 refills | Status: DC

## 2021-11-07 NOTE — Telephone Encounter (Signed)
*  STAT* If patient is at the pharmacy, call can be transferred to refill team.   1. Which medications need to be refilled? (please list name of each medication and dose if known)  ELIQUIS 5 MG TABS tablet lisinopril (ZESTRIL) 5 MG tablet  2. Which pharmacy/location (including street and city if local pharmacy) is medication to be sent to? [place], [place], [place] [address] Phone: [phone] Fax: [phone]  3. Do they need a 30 day or 90 day supply? An emergency week supply    Patient forgot meds on vacation only has one days worth of medication left.

## 2021-11-07 NOTE — Telephone Encounter (Signed)
Lisinopril sent to desired pharmacy Eliquis Rx sent to anti coag.

## 2021-11-07 NOTE — Telephone Encounter (Signed)
Rx sent for Allopurinol as Elliquis and Lisinopril was prescribed by Cardiology. Pt informed pt contact cardiology for refills.

## 2021-11-07 NOTE — Telephone Encounter (Signed)
Patient is only asking for a week supply and sent to [place], [place]

## 2021-11-07 NOTE — Telephone Encounter (Signed)
Prescription refill request for Eliquis received. Indication: Aflutter Last office visit: 06/02/21 ([place])  Scr: 1.36 (02/27/21)  Age: 86 Weight: 113kg  Appropriate dose and refill sent to requested pharmacy.

## 2021-11-07 NOTE — Telephone Encounter (Signed)
Pt on vacation, did not take enough medication with him to last his vacation requests an emergency fill of ELIQUIS 5 MG TABS tablet, lisinopril (ZESTRIL) 5 MG tablet,allopurinol (ZYLOPRIM) 100 MG tablet  [facility]  [address] [place]   [phone] is the phone, [phone] is the fax

## 2021-11-08 ENCOUNTER — Telehealth: Payer: Self-pay | Admitting: Pharmacist

## 2021-11-08 ENCOUNTER — Telehealth: Payer: Self-pay

## 2021-11-08 ENCOUNTER — Other Ambulatory Visit: Payer: Self-pay | Admitting: Family Medicine

## 2021-11-08 NOTE — Chronic Care Management (AMB) (Signed)
Chronic Care Management Pharmacy Assistant   Name: Gary [name]  MRN: [id number] DOB: [birth year]  Reason for Encounter: Disease State / Hypertension Assessment Call    Conditions to be addressed/monitored: HTN  Recent office visits:  08/22/2021 Carolann Littler MD - Patient was seen for hypertension and additional issues. No medication changes. Follow up in 6 months.  Recent consult visits:  08/24/2021 Jarome Matin MD (dermatology) - Patient was seen for personal history of other malignant neoplasm of skin and additional issues. No other chart notes.  06/30/2021 [name] (opthalmology) - Patient was seen for Tributary (branch) retinal vein occlusion, left eye, with macular edema and additional issues. No other chart notes.  06/09/2021 Alois Cliche (optometry) - Patient was seen for Cystoid macular degeneration, left eye. No other chart notes.   06/02/2021 Mihai Croitoru MD (cardiology) - Patient was seen for typical atrial flutter and additional issues. No medication changes. Follow up in 12 months.  Hospital visits:  None  Medications: Outpatient Encounter Medications as of 11/08/2021  Medication Sig   allopurinol (ZYLOPRIM) 100 MG tablet Take 2 tablets (200 mg total) by mouth daily.   apixaban (ELIQUIS) 5 MG TABS tablet Take 1 tablet (5 mg total) by mouth 2 (two) times daily.   co-enzyme Q-10 30 MG capsule Take 30 mg by mouth every other day.    fluticasone (FLONASE) 50 MCG/ACT nasal spray PLACE 1 SPRAY INTO BOTH NOSTRILS EVERY OTHER DAY.   lisinopril (ZESTRIL) 5 MG tablet Take 2 tablets (10 mg total) by mouth daily.   minocycline (MINOCIN,DYNACIN) 100 MG capsule Take 100 mg by mouth every other day.    Multiple Vitamin (MULTIVITAMIN) tablet Take 1 tablet by mouth daily.   simvastatin (ZOCOR) 20 MG tablet TAKE 1 TABLET EVERY OTHER DAY   tamsulosin (FLOMAX) 0.4 MG CAPS capsule Take 1 capsule (0.4 mg total) by mouth daily.   Facility-Administered Encounter Medications as  of 11/08/2021  Medication   0.9 %  sodium chloride infusion  Fill History: allopurinol 100 mg tablet 09/10/2021 90   Eliquis 5 mg tablet 09/08/2021 90   fluticasone propionate 50 mcg/actuation nasal spray,suspension 09/10/2021 90   lisinopril 5 mg tablet 10/23/2021 90   simvastatin 20 mg tablet 09/08/2021 90   tamsulosin 0.4 mg capsule 10/24/2021 90   Reviewed chart prior to disease state call. Spoke with patient regarding BP  Recent Office Vitals: BP Readings from Last 3 Encounters:  08/22/21 136/60  06/02/21 (!) 122/58  03/01/21 140/80   Pulse Readings from Last 3 Encounters:  08/22/21 70  06/02/21 (!) 50  03/01/21 (!) 57    Wt Readings from Last 3 Encounters:  08/22/21 249 lb 3.2 oz (113 kg)  06/02/21 248 lb 3.2 oz (112.6 kg)  05/20/21 251 lb (113.9 kg)     Kidney Function Lab Results  Component Value Date/Time   CREATININE 1.36 (H) 02/27/2021 03:17 PM   CREATININE 1.25 02/14/2021 11:25 AM   CREATININE 1.14 (H) 03/08/2020 09:57 AM   CREATININE 1.64 (H) 01/26/2020 09:13 AM   GFR 53.47 (L) 02/14/2021 11:25 AM   GFRNONAA 52 (L) 02/27/2021 03:17 PM   GFRAA >60 08/16/2017 09:36 AM       Latest Ref Rng & Units 02/27/2021    3:17 PM 02/14/2021   11:25 AM 02/02/2021   10:07 AM  BMP  Glucose 70 - 99 mg/dL 98  108  96   BUN 8 - 23 mg/dL 42  33  32   Creatinine 0.61 -  1.24 mg/dL 1.36  1.25  1.27   Sodium 135 - 145 mmol/L 137  139  138   Potassium 3.5 - 5.1 mmol/L 4.6  4.8  5.3 No hemolysis seen   Chloride 98 - 111 mmol/L 104  108  105   CO2 22 - 32 mmol/L '25  26  27   '$ Calcium 8.9 - 10.3 mg/dL 8.8  8.6  8.9     Current antihypertensive regimen:  Lisinopril 5 mg 2 tablets daily  How often are you checking your Blood Pressure? Patient was checking blood pressure 2-3 times per week, over the past week he has checked his blood pressures several times per day.   Current home BP readings: Patient states today's most recent reading was 153/85 and this is the where  most of his readings are. He did say his recent readings have been between 137/71 and 185/90.  What recent interventions/DTPs have been made by any provider to improve Blood Pressure control since last CPP Visit: No recent interventions  Any recent hospitalizations or ED visits since last visit with CPP? Patient was seen at [facility] ED for elevated blood pressure on 11/05/2021.  Patient states they did labs, EKG and CXR and told all looked normal, advised to follow up with his PCP.  Patient is scheduled with Dr. Elease Hashimoto on 11/09/2021 for follow up. Patient has all his blood pressures in his monitor, he will write them down to have available for his virtual visit tomorrow.   What diet changes have been made to improve Blood Pressure Control?  Patient follows no specific diet Breakfast - patient will have eggs and toast Lunch - patient will have a sandwich Dinner - patient will have a variety, he eats out twice a week or he will have a salad with chicken or fish Patient states for the past week his son was visiting and doing all the cooking. He was eating a lot more fried food and desserts.  What exercise is being done to improve your Blood Pressure Control?  Patient trains with a trainer twice weekly and some golf  Adherence Review: Is the patient currently on ACE/ARB medication? Yes Does the patient have >5 day gap between last estimated fill dates? No   Care Gaps: AWV - scheduled 05/22/2022 Last BP - 136/60 on 08/22/2021 Covid booster - overdue Eye exam - overdue HGA1C - overdue  Star Rating Drugs: Lisinopril '5mg'$  - last fill 10/23/2021 90 DS at [facility] Simvastatin '20mg'$  - last fill 09/08/2021 90 DS at [facility] Pharmacist Assistant [phone]

## 2021-11-08 NOTE — Telephone Encounter (Signed)
---  caller states he went to the ER saturday. had ekg, blood work, Insurance account manager. nothing abnormal found. did not get anything for the bp. out of town right. bp this morning 158/88 and last night 180?/90? it was higher than that when he went to the ER  11/08/2021 10:53:49 AM See PCP within 24 Hours [place], RN, Estill B[place]berg  Comments User: Rozelle Logan, RN Date/Time Eilene Ghazi Time): 11/08/2021 10:55:05 AM was possibly over 200 sys on sunday. patient states he has taken an extra '5mg'$  lisinopril tab to see if that would help.  Referrals REFERRED TO PCP OFFICE  Pt has appt with PCP on 11/09/21

## 2021-11-08 NOTE — Telephone Encounter (Signed)
  Pt is calling back, he got his eliquis but the lisinoril was sent to [facility]. Pt needs to send it to [facility], [place],  [address] Phone: [phone] Fax: [phone] he is there for vacation

## 2021-11-09 ENCOUNTER — Encounter: Payer: Self-pay | Admitting: Family Medicine

## 2021-11-09 ENCOUNTER — Telehealth (INDEPENDENT_AMBULATORY_CARE_PROVIDER_SITE_OTHER): Payer: Medicare HMO | Admitting: Family Medicine

## 2021-11-09 VITALS — BP 164/101 | HR 67 | Ht 75.0 in | Wt 249.2 lb

## 2021-11-09 DIAGNOSIS — I10 Essential (primary) hypertension: Secondary | ICD-10-CM

## 2021-11-09 MED ORDER — LISINOPRIL 5 MG PO TABS: 10.0000 mg | ORAL_TABLET | Freq: Every day | ORAL | 1 refills | Status: DC

## 2021-11-09 NOTE — Addendum Note (Signed)
Addended by: Wonda Horner on: 11/09/2021 12:53 PM   Modules accepted: Orders

## 2021-11-09 NOTE — Telephone Encounter (Signed)
Refill for lisinopril sent to [facility] on [address].

## 2021-11-09 NOTE — Progress Notes (Signed)
Patient ID: PEDROHENRIQUE MCCONVILLE, male   DOB: 1935/08/03, 86 y.o.   MRN: [id number]   Virtual Visit via Telephone Note  I connected with Judee Clara on 11/09/21 at  4:00 PM EDT by telephone and verified that I am speaking with the correct person using two identifiers.   I discussed the limitations, risks, security and privacy concerns of performing an evaluation and management service by telephone and the availability of in person appointments. I also discussed with the patient that there may be a patient responsible charge related to this service. The patient expressed understanding and agreed to proceed.  Location patient: home Location provider: work or home office Participants present for the call: patient, provider Patient did not have a visit in the prior 7 days to address this/these issue(s).   History of Present Illness: Spoke with patient by phone.  [place] blood pressure on there and has several readings initially 536 systolic which is high for him.  He has history of hypertension and takes lisinopril 10 mg daily.  He got a new home blood pressure cuff and had several readings in the 644 systolic and as high as 034.  He went to local ER there and had blood pressure 163/83.  He apparently had a EKG, chest x-ray, and labs which were unremarkable.  He has been compliant with his lisinopril.  Occasional alcohol use but no binging.  Non-smoker.  No recent headaches.  No chest pains.  No peripheral edema.  He states his heart rate at baseline is usually low 50s.  He does have history of atrial flutter and is on Eliquis.  Past Medical History:  Diagnosis Date   Adenomatous colon polyp 06/2000   ALLERGIC RHINITIS 10/03/2007   Allergy    seasonal   Arthritis    knees   Atrial flutter ([place]) 06/26/2014   Cancer ([place])    skin cancers removed in the past basal cell and squamous cell   CARDIAC MURMUR, AORTIC 10/03/2007   ERECTILE DYSFUNCTION 10/03/2007    High blood pressure 03/2016   HYPERLIPIDEMIA 10/03/2007   Rosacea    Sleep apnea    Past Surgical History:  Procedure Laterality Date   ADENOIDECTOMY     CARDIOVERSION N/A 07/01/2014   Procedure: CARDIOVERSION;  Surgeon: [name], MD;  Location: [facility];  Service: Cardiovascular;  Laterality: N/A;   COLONOSCOPY     PALATE SURGERY     TEE WITHOUT CARDIOVERSION N/A 07/01/2014   Procedure: TRANSESOPHAGEAL ECHOCARDIOGRAM (TEE);  Surgeon: [name], MD;  Location: [facility];  Service: Cardiovascular;  Laterality: N/A;   TONSILLECTOMY     TOTAL KNEE ARTHROPLASTY Right 08/27/2017   Procedure: RIGHT TOTAL KNEE ARTHROPLASTY;  Surgeon: Vickey Huger, MD;  Location: [facility];  Service: Orthopedics;  Laterality: Right;    reports that he has quit smoking. He has never used smokeless tobacco. He reports current alcohol use of about 8.0 standard drinks of alcohol per week. He reports that he does not use drugs. family history includes CAD in his brother; Cancer in his father; Diabetes in his mother; Hypotension in his sister; Liver cancer in his father; Stomach cancer in his paternal aunt. No Known Allergies    Observations/Objective: Patient sounds cheerful and well on the phone. I do not appreciate any SOB. Speech and thought processing are grossly intact. Patient reported vitals:  Assessment and Plan:  Hypertension.  Poorly controlled.  Patient currently on lisinopril 10 mg daily.  -  We discussed nonpharmacologic management.  Try to keep sodium intake less than 2500 mg daily -Regular aerobic exercise such as walking as tolerated -Stay well-hydrated -Continue lisinopril 10 mg daily -We discussed addition of amlodipine 5 mg daily.  We did consider nondihydropyridine calcium channel blocker but with already slow heart rate will start Norvasc. -Set up follow-up in a few weeks after he gets back here for office follow-up  Follow Up Instructions:    99441 5-10 99442 11-20 99443  21-30 I did not refer this patient for an OV in the next 24 hours for this/these issue(s).  I discussed the assessment and treatment plan with the patient. The patient was provided an opportunity to ask questions and all were answered. The patient agreed with the plan and demonstrated an understanding of the instructions.   The patient was advised to call back or seek an in-person evaluation if the symptoms worsen or if the condition fails to improve as anticipated.  I provided 23 minutes of non-face-to-face time during this encounter.   Carolann Littler, MD

## 2021-11-10 ENCOUNTER — Telehealth: Payer: Self-pay | Admitting: Family Medicine

## 2021-11-10 MED ORDER — AMLODIPINE BESYLATE 5 MG PO TABS: 5.0000 mg | ORAL_TABLET | Freq: Every day | ORAL | 3 refills | Status: DC

## 2021-11-10 NOTE — Telephone Encounter (Signed)
Pt had virtual visit yesterday and he is still waiting for amlodipine 5 mg to be send to  [facility], [place] Phone:  [phone]  Fax:  [phone]    Pt is aware md out of office today and will be back tomorrow

## 2021-11-14 DIAGNOSIS — D485 Neoplasm of uncertain behavior of skin: Secondary | ICD-10-CM | POA: Diagnosis not present

## 2021-11-14 DIAGNOSIS — L570 Actinic keratosis: Secondary | ICD-10-CM | POA: Diagnosis not present

## 2021-11-14 DIAGNOSIS — L812 Freckles: Secondary | ICD-10-CM | POA: Diagnosis not present

## 2021-11-14 DIAGNOSIS — Z85828 Personal history of other malignant neoplasm of skin: Secondary | ICD-10-CM | POA: Diagnosis not present

## 2021-11-14 DIAGNOSIS — L820 Inflamed seborrheic keratosis: Secondary | ICD-10-CM | POA: Diagnosis not present

## 2021-11-14 DIAGNOSIS — L821 Other seborrheic keratosis: Secondary | ICD-10-CM | POA: Diagnosis not present

## 2021-11-23 ENCOUNTER — Encounter: Payer: Self-pay | Admitting: Family Medicine

## 2021-11-23 ENCOUNTER — Telehealth: Payer: Self-pay | Admitting: Pharmacist

## 2021-11-23 ENCOUNTER — Other Ambulatory Visit: Payer: Self-pay | Admitting: *Deleted

## 2021-11-23 ENCOUNTER — Ambulatory Visit (INDEPENDENT_AMBULATORY_CARE_PROVIDER_SITE_OTHER): Payer: Medicare HMO | Admitting: Family Medicine

## 2021-11-23 VITALS — BP 118/60 | HR 59 | Temp 97.7°F | Ht 75.0 in | Wt 258.5 lb

## 2021-11-23 DIAGNOSIS — I10 Essential (primary) hypertension: Secondary | ICD-10-CM | POA: Diagnosis not present

## 2021-11-23 MED ORDER — ALLOPURINOL 100 MG PO TABS: 200.0000 mg | ORAL_TABLET | Freq: Every day | ORAL | 0 refills | Status: DC

## 2021-11-23 NOTE — Progress Notes (Signed)
Established Patient Office Visit  Subjective   Patient ID: Gary [name], male    DOB: February 08, 1936  Age: 86 y.o. MRN: [id number]  Chief Complaint  Patient presents with   Follow-up   Hypertension    HPI   Here for follow-up hypertension.  He was recently at the [facility] and had called [place] with blood pressures as high as 160/101.  He has several weeks of elevation.  He is already taking lisinopril 10 mg daily.  We added amlodipine 5 mg daily at home blood pressures are improved since then.  He had several readings 403K and 742V systolic with occasional readings 956L and 875I systolic.  Diastolics well controlled.  No dizziness.  No headaches.  No peripheral edema.  He knows that he needs to lose some weight.  Drinks red wine usually 3 to 4 days/week usually 2 to 3 glasses/day  His wife recently passed away from aggressive lung cancer.  He naturally has had difficulty coping but has very supportive family and friends.  [place] [facility] where he has second home  Past Medical History:  Diagnosis Date   Adenomatous colon polyp 06/2000   ALLERGIC RHINITIS 10/03/2007   Allergy    seasonal   Arthritis    knees   Atrial flutter ([place]) 06/26/2014   Cancer ([place])    skin cancers removed in the past basal cell and squamous cell   CARDIAC MURMUR, AORTIC 10/03/2007   ERECTILE DYSFUNCTION 10/03/2007   High blood pressure 03/2016   HYPERLIPIDEMIA 10/03/2007   Rosacea    Sleep apnea    Past Surgical History:  Procedure Laterality Date   ADENOIDECTOMY     CARDIOVERSION N/A 07/01/2014   Procedure: CARDIOVERSION;  Surgeon: [name], MD;  Location: [facility];  Service: Cardiovascular;  Laterality: N/A;   COLONOSCOPY     PALATE SURGERY     TEE WITHOUT CARDIOVERSION N/A 07/01/2014   Procedure: TRANSESOPHAGEAL ECHOCARDIOGRAM (TEE);  Surgeon: [name], MD;  Location: [facility];  Service: Cardiovascular;  Laterality: N/A;    TONSILLECTOMY     TOTAL KNEE ARTHROPLASTY Right 08/27/2017   Procedure: RIGHT TOTAL KNEE ARTHROPLASTY;  Surgeon: Vickey Huger, MD;  Location: [facility];  Service: Orthopedics;  Laterality: Right;    reports that he has quit smoking. He has never used smokeless tobacco. He reports current alcohol use of about 8.0 standard drinks of alcohol per week. He reports that he does not use drugs. family history includes CAD in his brother; Cancer in his father; Diabetes in his mother; Hypotension in his sister; Liver cancer in his father; Stomach cancer in his paternal aunt. No Known Allergies  Review of Systems  Constitutional:  Negative for malaise/fatigue.  Eyes:  Negative for blurred vision.  Respiratory:  Negative for shortness of breath.   Cardiovascular:  Negative for chest pain.  Gastrointestinal:  Negative for abdominal pain.  Neurological:  Negative for dizziness, weakness and headaches.      Objective:     BP 118/60 (BP Location: Left Arm, Cuff Size: Normal)   Pulse (!) 59   Temp 97.7 F (36.5 C) (Oral)   Ht '6\' 3"'$  (1.905 m)   Wt 258 lb 8 oz (117.3 kg)   SpO2 98%   BMI 32.31 kg/m    Physical Exam Constitutional:      Appearance: He is well-developed.  HENT:     Right Ear: External ear normal.  Left Ear: External ear normal.  Eyes:     Pupils: Pupils are equal, round, and reactive to light.  Neck:     Thyroid: No thyromegaly.  Cardiovascular:     Rate and Rhythm: Normal rate and regular rhythm.  Pulmonary:     Effort: Pulmonary effort is normal. No respiratory distress.     Breath sounds: Normal breath sounds. No wheezing or rales.  Musculoskeletal:     Cervical back: Neck supple.     Right lower leg: No edema.     Left lower leg: No edema.  Neurological:     Mental Status: He is alert and oriented to person, place, and time.      No results found for any visits on 11/23/21.    The ASCVD Risk score (Arnett DK, et al., 2019) failed to calculate for the  following reasons:   The 2019 ASCVD risk score is only valid for ages 28 to 78    Assessment & Plan:   Problem List Items Addressed This Visit       Unprioritized   Essential hypertension - Primary   -Improved with recent addition of amlodipine -Stressed importance of weight loss and establishing more consistent exercise -Try to keep daily sodium intake less than 2500 mg -Alcohol in moderation--- no more than 10- 12 ounces of wine per day -He has follow-up scheduled in October for physical -Continue lisinopril 10 mg daily and amlodipine 5 mg daily  No follow-ups on file.    Carolann Littler, MD

## 2021-11-23 NOTE — Chronic Care Management (AMB) (Signed)
    Chronic Care Management Pharmacy Assistant   Name: IZIAH CATES  MRN: [id number] DOB: [birth year]  Reason for Encounter: Reschedule appointment with [name] Clinical Pharmacist.  Rescheduled with patient.    [facility] Pharmacist Assistant [phone]

## 2021-12-14 ENCOUNTER — Other Ambulatory Visit: Payer: Self-pay | Admitting: Family Medicine

## 2022-01-06 ENCOUNTER — Telehealth: Payer: Self-pay | Admitting: Family Medicine

## 2022-01-06 ENCOUNTER — Telehealth (INDEPENDENT_AMBULATORY_CARE_PROVIDER_SITE_OTHER): Payer: Medicare HMO | Admitting: Adult Health

## 2022-01-06 ENCOUNTER — Encounter: Payer: Self-pay | Admitting: Adult Health

## 2022-01-06 VITALS — Ht 75.0 in | Wt 258.0 lb

## 2022-01-06 DIAGNOSIS — U071 COVID-19: Secondary | ICD-10-CM

## 2022-01-06 MED ORDER — MOLNUPIRAVIR EUA 200MG CAPSULE: 4.0000 | ORAL_CAPSULE | Freq: Two times a day (BID) | ORAL | 0 refills | Status: AC

## 2022-01-06 NOTE — Progress Notes (Signed)
Virtual Visit via Video Note  I connected with [name]  on 01/06/22 at  4:00 PM EDT by a video enabled telemedicine application and verified that I am speaking with the correct person using two identifiers.  Location patient: home Location provider:work or home office Persons participating in the virtual visit: patient, provider  I discussed the limitations of evaluation and management by telemedicine and the availability of in person appointments. The patient expressed understanding and agreed to proceed.   HPI: 86 year old male who is being evaluated today for COVID-19 infection.  He reports that his symptoms started last night with chills.  He tested positive this morning.  Current symptoms include a somewhat productive cough, headache, and generalized body aches.  He denies fevers, or chills.  He is interested in antiviral therapy   ROS: See pertinent positives and negatives per HPI.  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   CARDIAC MURMUR, AORTIC 10/03/2007   ERECTILE DYSFUNCTION 10/03/2007   High blood pressure 03/2016   HYPERLIPIDEMIA 10/03/2007   Rosacea    Sleep apnea     Past Surgical History:  Procedure Laterality Date   ADENOIDECTOMY     CARDIOVERSION N/A 07/01/2014   Procedure: CARDIOVERSION;  Surgeon: [name], MD;  Location: [facility];  Service: Cardiovascular;  Laterality: N/A;   COLONOSCOPY     PALATE SURGERY     TEE WITHOUT CARDIOVERSION N/A 07/01/2014   Procedure: TRANSESOPHAGEAL ECHOCARDIOGRAM (TEE);  Surgeon: [name], MD;  Location: [facility];  Service: Cardiovascular;  Laterality: N/A;   TONSILLECTOMY     TOTAL KNEE ARTHROPLASTY Right 08/27/2017   Procedure: RIGHT TOTAL KNEE ARTHROPLASTY;  Surgeon: Vickey Huger, MD;   Location: [facility];  Service: Orthopedics;  Laterality: Right;    Family History  Problem Relation Age of Onset   Diabetes Mother    Cancer Father        liver ca   Liver cancer Father    Hypotension Sister    CAD Brother    Stomach cancer Paternal Aunt        Current Outpatient Medications:    allopurinol (ZYLOPRIM) 100 MG tablet, TAKE 2 TABLETS (200 MG TOTAL) BY MOUTH DAILY., Disp: 60 tablet, Rfl: 0   amLODipine (NORVASC) 5 MG tablet, Take 1 tablet (5 mg total) by mouth daily., Disp: 90 tablet, Rfl: 3   apixaban (ELIQUIS) 5 MG TABS tablet, Take 1 tablet (5 mg total) by mouth 2 (two) times daily., Disp: 14 tablet, Rfl: 0   co-enzyme Q-10 30 MG capsule, Take 30 mg by mouth every other day. , Disp: , Rfl:    fluticasone (FLONASE) 50 MCG/ACT nasal spray, PLACE 1 SPRAY INTO BOTH NOSTRILS EVERY OTHER DAY., Disp: 16 g, Rfl: 2   lisinopril (ZESTRIL) 5 MG tablet, Take 2 tablets (10 mg total) by mouth daily., Disp: 180 tablet, Rfl: 1   minocycline (MINOCIN,DYNACIN) 100 MG capsule, Take 100 mg by mouth every other day. , Disp: , Rfl:    molnupiravir EUA (LAGEVRIO) 200 mg CAPS capsule, Take 4 capsules (800 mg total) by mouth 2 (two) times daily for 5 days., Disp: 40 capsule, Rfl: 0   Multiple Vitamin (MULTIVITAMIN) tablet, Take 1 tablet by mouth daily., Disp: ,  Rfl:    simvastatin (ZOCOR) 20 MG tablet, TAKE 1 TABLET EVERY OTHER DAY, Disp: 45 tablet, Rfl: 3   tamsulosin (FLOMAX) 0.4 MG CAPS capsule, Take 1 capsule (0.4 mg total) by mouth daily., Disp: 90 capsule, Rfl: 3  Current Facility-Administered Medications:    0.9 %  sodium chloride infusion, 500 mL, Intravenous, Once, [place] Plan, Pricilla [place], MD  EXAM:  VITALS per patient if applicable:  GENERAL: alert, oriented, appears well and in no acute distress  HEENT: atraumatic, conjunttiva clear, no obvious abnormalities on inspection of external nose and ears  NECK: normal movements of the head and neck  LUNGS: on inspection no signs of  respiratory distress, breathing rate appears normal, no obvious gross SOB, gasping or wheezing  CV: no obvious cyanosis  MS: moves all visible extremities without noticeable abnormality  PSYCH/NEURO: pleasant and cooperative, no obvious depression or anxiety, speech and thought processing grossly intact  ASSESSMENT AND PLAN:  Discussed the following assessment and plan:  1. COVID-19 virus infection Will place on Lagevrio due to being on Xarelto.  - Reviewed side effects - Follow up if not improving in the next 2-3 days  - molnupiravir EUA (LAGEVRIO) 200 mg CAPS capsule; Take 4 capsules (800 mg total) by mouth 2 (two) times daily for 5 days.  Dispense: 40 capsule; Refill: 0      I discussed the assessment and treatment plan with the patient. The patient was provided an opportunity to ask questions and all were answered. The patient agreed with the plan and demonstrated an understanding of the instructions.   The patient was advised to call back or seek an in-person evaluation if the symptoms worsen or if the condition fails to improve as anticipated.   Dorothyann Peng, NP

## 2022-01-06 NOTE — Telephone Encounter (Signed)
Patient calling in with respiratory symptoms: Shortness of breath, chest pain, palpitations or other red words send to Triage  Does the patient have a fever over 100, cough, congestion, sore throat, runny nose, lost of taste/smell (please list symptoms that patient has)?cough,sore throat  What date did symptoms start?today (If over 5 days ago, pt may be scheduled for in person visit)  Have you tested for Covid in the last 5 days? No   If yes, was it positive '[x]'$  OR negative '[]'$ ? If positive in the last 5 days, please schedule virtual visit now. If negative, schedule for an in person OV with the next available provider if PCP has no openings. Please also let patient know they will be tested again (follow the script below)  "you will have to arrive 1mns prior to your appt time to be Covid tested. Please park in back of office at the [facility] & call [phone] let the staff know you have arrived. A staff member will meet you at your car to do a rapid covid test. Once the test has resulted you will be notified by phone of your results to determine if appt will remain an in person visit or be converted to a virtual/phone visit. If you arrive less than 332ms before your appt time, your visit will be automatically converted to virtual & any recommended testing will happen AFTER the visit." Pt has virtual with cory today 01-06-2022 4 pm THINGS TO REMEMBER  If no availability for virtual visit in office,  please schedule another [place] office  [facility], please instruct patient that they can schedule an evisit or virtual visit through their mychart account. Visits up to 8pm  patients can be seen in office 5 days after positive COVID test

## 2022-01-17 IMAGING — CR DG CHEST 2V
2 series · 2 of 2 positions shown · non-contrast
Comparison: Chest CT 06/06/2005

CLINICAL DATA: Chest pain.

EXAM:
CHEST - 2 VIEW

[w chest pa]
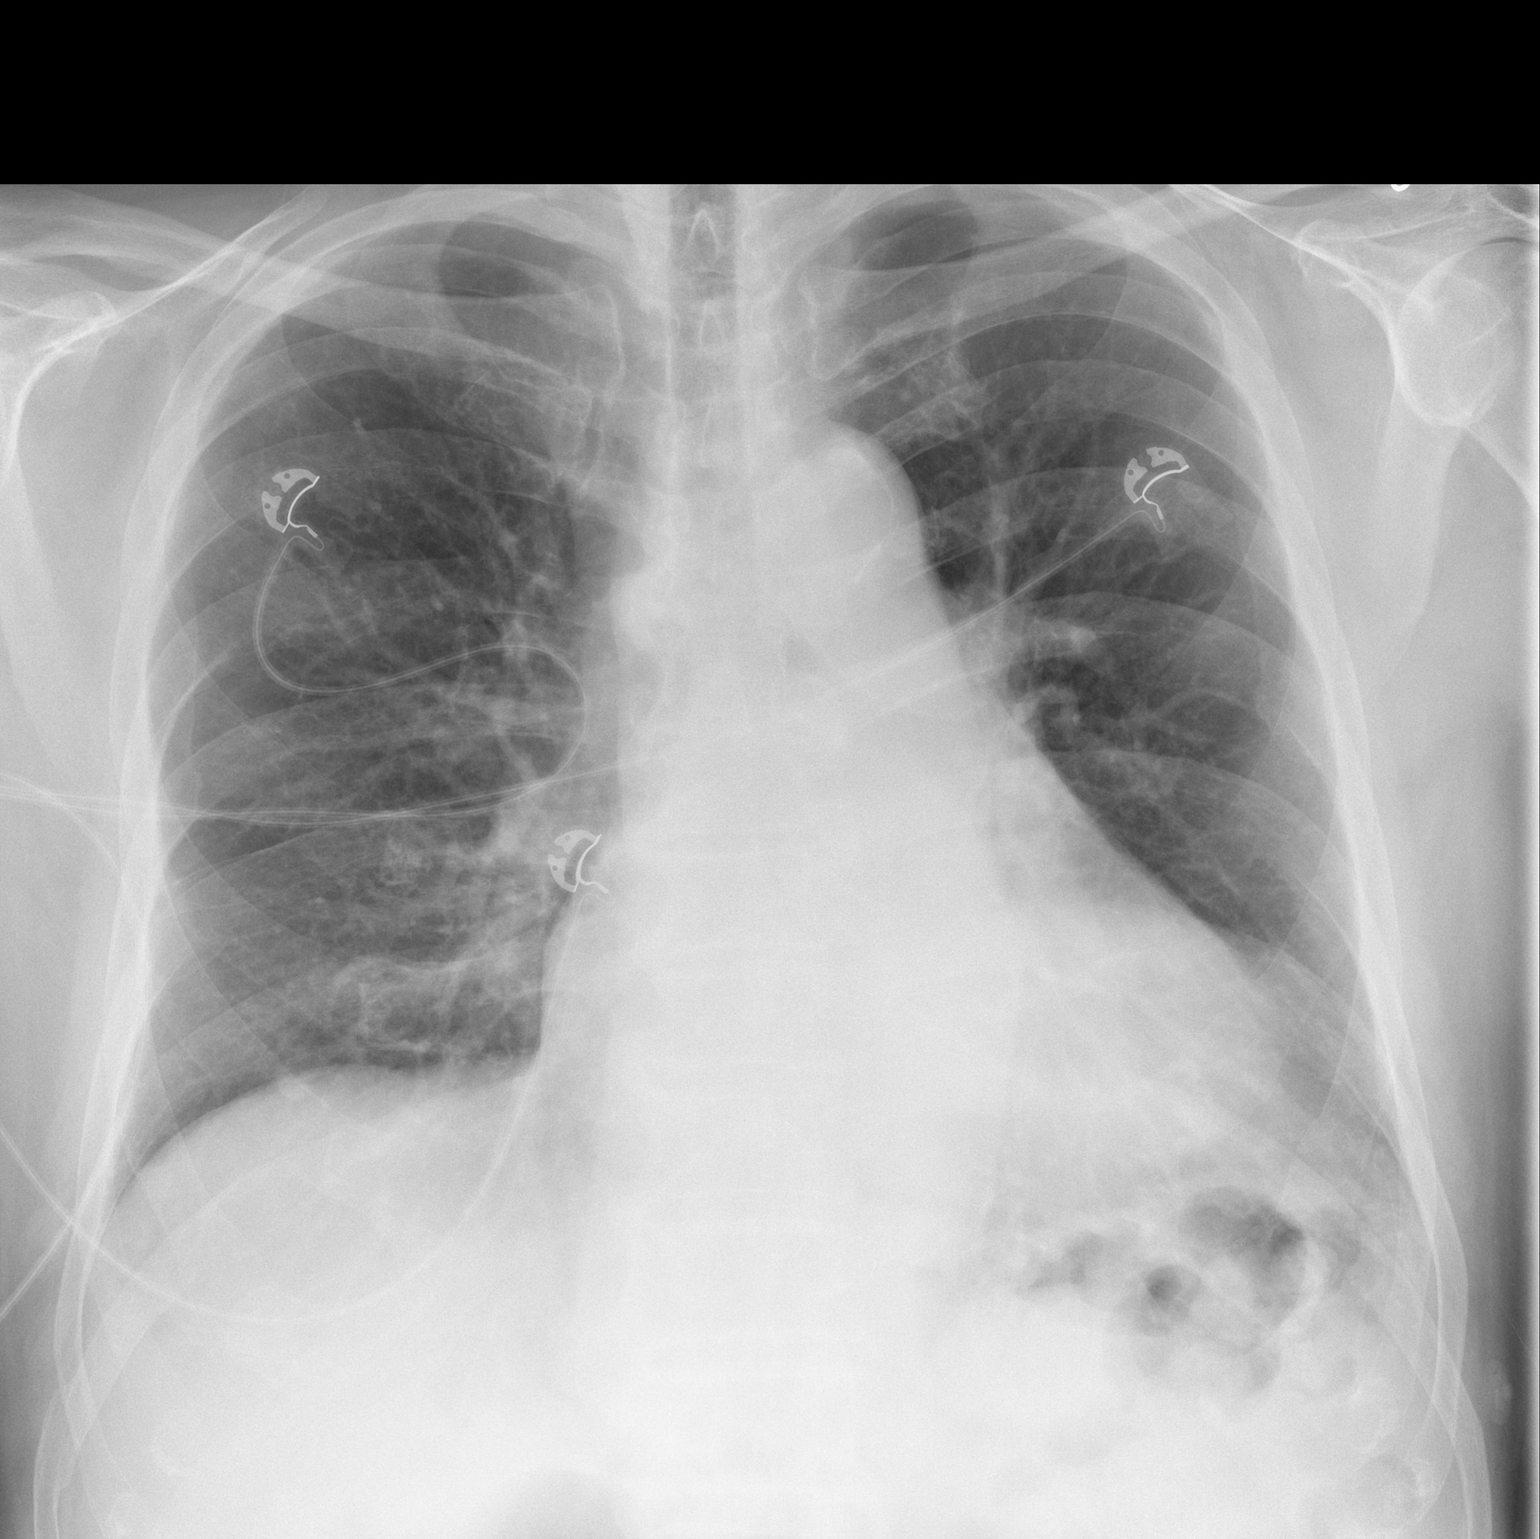

[w chest lat]
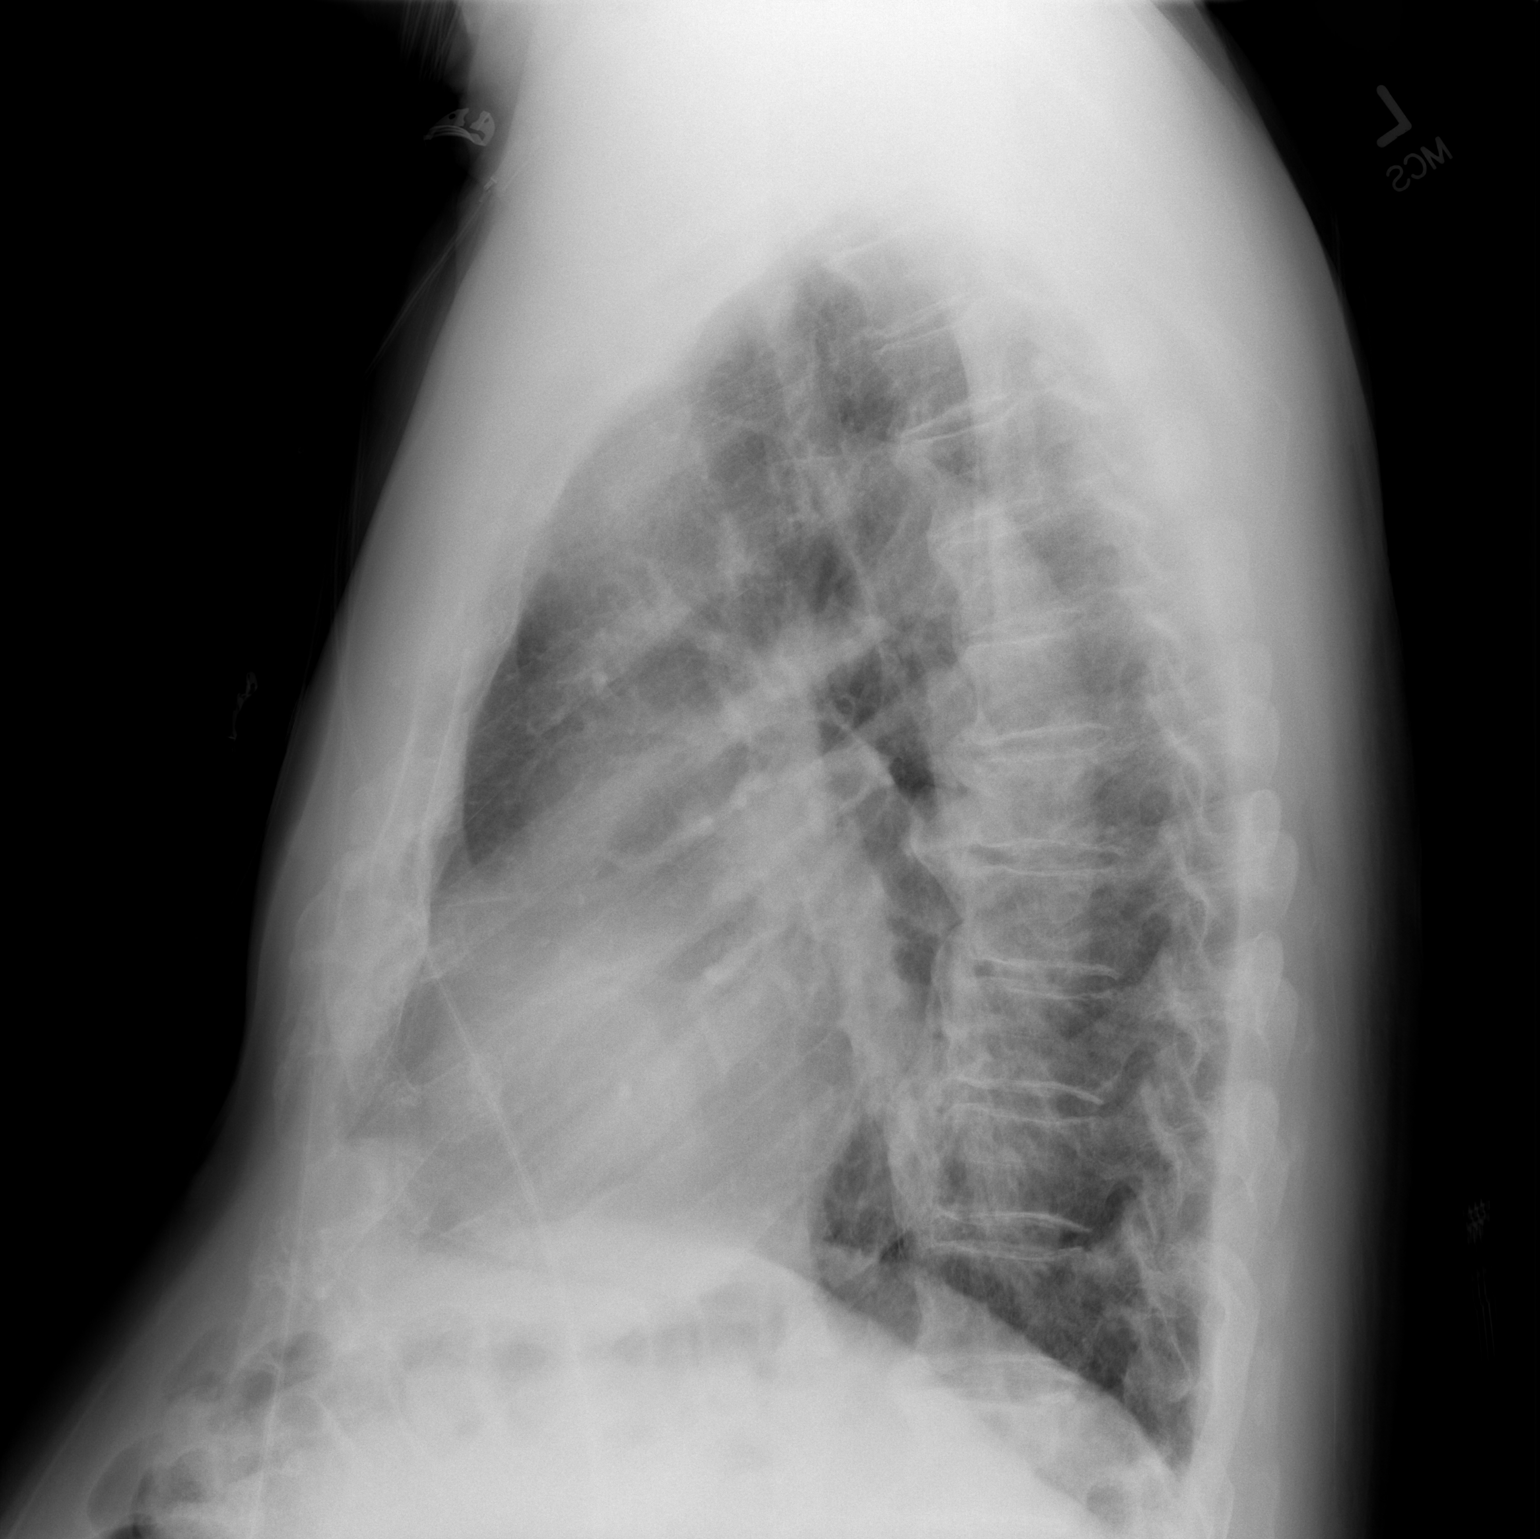

[2 of 2 positions shown; findings below may reference images not displayed]

FINDINGS: Upper normal heart size. Normal mediastinal contours with aortic
atherosclerosis. Minor left lung base atelectasis. No confluent
airspace disease. No pulmonary edema or pleural effusion. No
pneumothorax. Thoracic spondylosis without acute osseous
abnormality.
IMPRESSION: Upper normal heart size. Minor left lung base atelectasis.

## 2022-01-30 ENCOUNTER — Telehealth: Payer: Self-pay | Admitting: Family Medicine

## 2022-01-30 MED ORDER — AMLODIPINE BESYLATE 5 MG PO TABS: 5.0000 mg | ORAL_TABLET | Freq: Every day | ORAL | 3 refills | Status: DC

## 2022-01-30 NOTE — Telephone Encounter (Signed)
Refill amLODipine (NORVASC) 5 MG tablet   [id number] - [place], [place] - [address] AT [facility] OF [address] Phone:  [phone]  Fax:  [phone]

## 2022-01-30 NOTE — Telephone Encounter (Signed)
Rx sent 

## 2022-02-04 ENCOUNTER — Other Ambulatory Visit: Payer: Self-pay | Admitting: Family Medicine

## 2022-02-06 ENCOUNTER — Other Ambulatory Visit: Payer: Self-pay

## 2022-02-06 MED ORDER — FLUTICASONE PROPIONATE 50 MCG/ACT NA SUSP: 1.0000 | NASAL | 4 refills | Status: DC

## 2022-02-07 ENCOUNTER — Encounter: Payer: Medicare HMO | Admitting: Family Medicine

## 2022-02-07 ENCOUNTER — Other Ambulatory Visit: Payer: Self-pay | Admitting: *Deleted

## 2022-02-07 MED ORDER — FLUTICASONE PROPIONATE 50 MCG/ACT NA SUSP: 1.0000 | NASAL | 4 refills | Status: DC

## 2022-02-07 NOTE — Telephone Encounter (Signed)
Rx done. 

## 2022-02-09 DIAGNOSIS — H3582 Retinal ischemia: Secondary | ICD-10-CM | POA: Diagnosis not present

## 2022-02-09 DIAGNOSIS — H43813 Vitreous degeneration, bilateral: Secondary | ICD-10-CM | POA: Diagnosis not present

## 2022-02-09 DIAGNOSIS — H35033 Hypertensive retinopathy, bilateral: Secondary | ICD-10-CM | POA: Diagnosis not present

## 2022-02-09 DIAGNOSIS — H34832 Tributary (branch) retinal vein occlusion, left eye, with macular edema: Secondary | ICD-10-CM | POA: Diagnosis not present

## 2022-02-13 ENCOUNTER — Telehealth: Payer: Self-pay

## 2022-02-13 ENCOUNTER — Encounter: Payer: Self-pay | Admitting: Family Medicine

## 2022-02-13 ENCOUNTER — Ambulatory Visit (INDEPENDENT_AMBULATORY_CARE_PROVIDER_SITE_OTHER): Payer: Medicare HMO | Admitting: Family Medicine

## 2022-02-13 VITALS — BP 120/70 | HR 63 | Temp 97.7°F | Ht 75.2 in | Wt 253.1 lb

## 2022-02-13 DIAGNOSIS — R7301 Impaired fasting glucose: Secondary | ICD-10-CM

## 2022-02-13 DIAGNOSIS — Z23 Encounter for immunization: Secondary | ICD-10-CM | POA: Diagnosis not present

## 2022-02-13 DIAGNOSIS — E7800 Pure hypercholesterolemia, unspecified: Secondary | ICD-10-CM

## 2022-02-13 DIAGNOSIS — I10 Essential (primary) hypertension: Secondary | ICD-10-CM

## 2022-02-13 DIAGNOSIS — M1A9XX0 Chronic gout, unspecified, without tophus (tophi): Secondary | ICD-10-CM

## 2022-02-13 DIAGNOSIS — M109 Gout, unspecified: Secondary | ICD-10-CM | POA: Insufficient documentation

## 2022-02-13 DIAGNOSIS — Z125 Encounter for screening for malignant neoplasm of prostate: Secondary | ICD-10-CM

## 2022-02-13 DIAGNOSIS — Z0000 Encounter for general adult medical examination without abnormal findings: Secondary | ICD-10-CM | POA: Diagnosis not present

## 2022-02-13 DIAGNOSIS — R809 Proteinuria, unspecified: Secondary | ICD-10-CM | POA: Diagnosis not present

## 2022-02-13 LAB — LIPID PANEL
Cholesterol: 146 mg/dL (ref 0–200)
HDL: 38.7 mg/dL — ABNORMAL LOW (ref >39.00)
LDL Cholesterol: 92 mg/dL (ref 0–99)
NonHDL: 107.33
Total CHOL/HDL Ratio: 4
Triglycerides: 77 mg/dL (ref 0.0–149.0)
VLDL: 15.4 mg/dL (ref 0.0–40.0)

## 2022-02-13 LAB — MICROALBUMIN / CREATININE URINE RATIO
Creatinine,U: 107.3 mg/dL
Microalb Creat Ratio: 113.3 mg/g — ABNORMAL HIGH (ref 0.0–30.0)
Microalb, Ur: 121.5 mg/dL — ABNORMAL HIGH (ref 0.0–1.9)

## 2022-02-13 LAB — CBC WITH DIFFERENTIAL/PLATELET
Basophils Absolute: 0 10*3/uL (ref 0.0–0.1)
Basophils Relative: 0.5 % (ref 0.0–3.0)
Eosinophils Absolute: 0.1 10*3/uL (ref 0.0–0.7)
Eosinophils Relative: 1.5 % (ref 0.0–5.0)
HCT: 43 % (ref 39.0–52.0)
Hemoglobin: 14.3 g/dL (ref 13.0–17.0)
Lymphocytes Relative: 16.5 % (ref 12.0–46.0)
Lymphs Abs: 1 10*3/uL (ref 0.7–4.0)
MCHC: 33.3 g/dL (ref 30.0–36.0)
MCV: 99.1 fl (ref 78.0–100.0)
Monocytes Absolute: 0.7 10*3/uL (ref 0.1–1.0)
Monocytes Relative: 11.8 % (ref 3.0–12.0)
Neutro Abs: 4 10*3/uL (ref 1.4–7.7)
Neutrophils Relative %: 69.7 % (ref 43.0–77.0)
Platelets: 137 10*3/uL — ABNORMAL LOW (ref 150.0–400.0)
RBC: 4.34 Mil/uL (ref 4.22–5.81)
RDW: 15.2 % (ref 11.5–15.5)
WBC: 5.8 10*3/uL (ref 4.0–10.5)

## 2022-02-13 LAB — BASIC METABOLIC PANEL
BUN: 38 mg/dL — ABNORMAL HIGH (ref 6–23)
CO2: 25 mEq/L (ref 19–32)
Calcium: 9 mg/dL (ref 8.4–10.5)
Chloride: 106 mEq/L (ref 96–112)
Creatinine, Ser: 1.28 mg/dL (ref 0.40–1.50)
GFR: 51.61 mL/min — ABNORMAL LOW (ref >60.00)
Glucose, Bld: 108 mg/dL — ABNORMAL HIGH (ref 70–99)
Potassium: 4.6 mEq/L (ref 3.5–5.1)
Sodium: 140 mEq/L (ref 135–145)

## 2022-02-13 LAB — HEPATIC FUNCTION PANEL
ALT: 19 U/L (ref 0–53)
AST: 18 U/L (ref 0–37)
Albumin: 4 g/dL (ref 3.5–5.2)
Alkaline Phosphatase: 61 U/L (ref 39–117)
Bilirubin, Direct: 0.2 mg/dL (ref 0.0–0.3)
Total Bilirubin: 1 mg/dL (ref 0.2–1.2)
Total Protein: 6.7 g/dL (ref 6.0–8.3)

## 2022-02-13 LAB — HEMOGLOBIN A1C: Hgb A1c MFr Bld: 6.4 % (ref 4.6–6.5)

## 2022-02-13 LAB — URIC ACID: Uric Acid, Serum: 6.9 mg/dL (ref 4.0–7.8)

## 2022-02-13 LAB — PSA, MEDICARE: PSA: 1.29 ng/ml (ref 0.10–4.00)

## 2022-02-13 MED ORDER — LISINOPRIL 20 MG PO TABS: 20.0000 mg | ORAL_TABLET | Freq: Every day | ORAL | 3 refills | Status: DC

## 2022-02-13 NOTE — Addendum Note (Signed)
Addended by: Nilda Riggs on: 02/13/2022 01:20 PM   Modules accepted: Orders

## 2022-02-13 NOTE — Addendum Note (Signed)
Addended by: Nilda Riggs on: 02/13/2022 01:39 PM   Modules accepted: Orders

## 2022-02-13 NOTE — Addendum Note (Signed)
Addended by: Nilda Riggs on: 02/13/2022 09:24 AM   Modules accepted: Orders

## 2022-02-13 NOTE — Telephone Encounter (Signed)
Patient had OV today and requested add on labs for PSA. Per discussion with PCP PSA was not recommended for this patient due to national guidelines but if patient insists lab orders can be placed. Patient requested labs be added on as he want to know his results. Labs added and add on sheet faxed per pt request.

## 2022-02-13 NOTE — Progress Notes (Signed)
Established Patient Office Visit  Subjective   Patient ID: Gary [name], male    DOB: 1935-10-04  Age: 86 y.o. MRN: [id number]  Chief Complaint  Patient presents with   Annual Exam    HPI   Mr. Brawn is seen for physical.  Generally doing well.  He has a place at [employer] and goes there frequently.  His wife's daughter is getting married there this coming weekend and they have planned to have the wedding at his residence.  His wife passed away complications of cancer earlier this year and he is coping fairly well.  Very supportive family.  Health maintenance reviewed.  He does need flu vaccine otherwise up-to-date.  His chronic problems include history of atrial flutter, hypertension, obstructive sleep apnea, impaired fasting glucose, rosacea, hyperlipidemia, BPH, gout.  No recent gout flareups.  Takes low-dose allopurinol.  Blood pressure currently stable.  He does remain on Eliquis.  No recent chest pains or dizziness.  No falls.  Past Medical History:  Diagnosis Date   Adenomatous colon polyp 06/2000   ALLERGIC RHINITIS 10/03/2007   Allergy    seasonal   Arthritis    knees   Atrial flutter ([place]) 06/26/2014   Cancer ([place])    skin cancers removed in the past basal cell and squamous cell   CARDIAC MURMUR, AORTIC 10/03/2007   ERECTILE DYSFUNCTION 10/03/2007   High blood pressure 03/2016   HYPERLIPIDEMIA 10/03/2007   Rosacea    Sleep apnea    Past Surgical History:  Procedure Laterality Date   ADENOIDECTOMY     CARDIOVERSION N/A 07/01/2014   Procedure: CARDIOVERSION;  Surgeon: [name], MD;  Location: [place];  Service: Cardiovascular;  Laterality: N/A;   COLONOSCOPY     PALATE SURGERY     TEE WITHOUT CARDIOVERSION N/A 07/01/2014   Procedure: TRANSESOPHAGEAL ECHOCARDIOGRAM (TEE);  Surgeon: [name], MD;  Location: [place];  Service: Cardiovascular;  Laterality: N/A;   TONSILLECTOMY     TOTAL KNEE ARTHROPLASTY Right 08/27/2017   Procedure: RIGHT TOTAL  KNEE ARTHROPLASTY;  Surgeon: Vickey Huger, MD;  Location: [place];  Service: Orthopedics;  Laterality: Right;    reports that he has quit smoking. He has never used smokeless tobacco. He reports current alcohol use of about 8.0 standard drinks of alcohol per week. He reports that he does not use drugs. family history includes CAD in his brother; Cancer in his father; Diabetes in his mother; Hypotension in his sister; Liver cancer in his father; Stomach cancer in his paternal aunt. No Known Allergies    Review of Systems  Constitutional:  Negative for chills, fever and malaise/fatigue.  Eyes:  Negative for blurred vision.  Respiratory:  Negative for shortness of breath.   Cardiovascular:  Negative for chest pain.  Gastrointestinal:  Negative for abdominal pain.  Genitourinary:  Negative for dysuria.  Neurological:  Negative for dizziness, weakness and headaches.      Objective:     BP 120/70 (BP Location: Left Arm, Patient Position: Sitting, Cuff Size: Large)   Pulse 63   Temp 97.7 F (36.5 C) (Oral)   Ht 6' 3.2" (1.91 m)   Wt 253 lb 1.6 oz (114.8 kg)   SpO2 98%   BMI 31.47 kg/m    Physical Exam Vitals reviewed.  Constitutional:      Appearance: He is well-developed.  HENT:     Right Ear: External ear normal.     Left Ear: External ear normal.  Eyes:  Pupils: Pupils are equal, round, and reactive to light.  Neck:     Thyroid: No thyromegaly.  Cardiovascular:     Rate and Rhythm: Normal rate and regular rhythm.  Pulmonary:     Effort: Pulmonary effort is normal. No respiratory distress.     Breath sounds: Normal breath sounds. No wheezing or rales.  Abdominal:     Palpations: Abdomen is soft.     Tenderness: There is no abdominal tenderness.  Musculoskeletal:     Cervical back: Neck supple.     Comments: He has trace nonpitting edema ankles feet lower legs bilaterally  Neurological:     Mental Status: He is alert and oriented to person, place, and time.       No results found for any visits on 02/13/22.    The ASCVD Risk score (Arnett DK, et al., 2019) failed to calculate for the following reasons:   The 2019 ASCVD risk score is only valid for ages 23 to 51    Assessment & Plan:   Physical exam.  Generally doing well.  Needs flu vaccine.  Other vaccines up-to-date.  -He has hyperlipidemia we will check lipid and hepatic panel.  Patient on simvastatin 20 mg daily  -History of gout stable on low-dose allopurinol.  Check uric acid level  -He is degree of impaired fasting glucose.  Recheck A1c.  Continue low glycemic diet.  Check urine microalbumin screen  Hypertension stable on amlodipine and lisinopril.  We had recently added amlodipine and blood pressures have improved.  Continue close monitoring.   No follow-ups on file.    Carolann Littler, MD

## 2022-02-16 ENCOUNTER — Ambulatory Visit: Payer: Medicare HMO | Admitting: Cardiovascular Disease

## 2022-02-24 ENCOUNTER — Telehealth: Payer: Self-pay | Admitting: Family Medicine

## 2022-02-24 ENCOUNTER — Telehealth: Payer: Self-pay | Admitting: Cardiovascular Disease

## 2022-02-24 NOTE — Telephone Encounter (Signed)
Patient calling the office for samples of medication:   1.  What medication and dosage are you requesting samples for? apixaban (ELIQUIS) 5 MG TABS tablet  2.  Are you currently out of this medication? Has enough for tonight and morning, but then he will be out. He states that he had ordered some through [facility], but his credit card was compromised and now his medication is being delayed. Requesting samples to get him through until he is able to get his.

## 2022-02-24 NOTE — Telephone Encounter (Signed)
Spoke with patient,  he has questions as why the  Lisinopril 10 mg was increased  to Lisinopril 20 mg, see lab results message from 02/13/22.    Patient  requested to be scheduled for a colonoscopy he stated that his last colonoscopy  was 5 years and at that time  several polyps were discovered.    Office visit appointment scheduled for 05/29/22.   Please advise.

## 2022-02-24 NOTE — Telephone Encounter (Signed)
Ok to give 1 week of samples of Eliquis '5mg'$  BID

## 2022-02-24 NOTE — Telephone Encounter (Signed)
Returned call to patient and made him aware that 2 boxes of Eliquis '5mg'$  samples are at the front desk for pick up.   Advised patient to call back to office with any issues, questions, or concerns. Patient verbalized understanding.

## 2022-02-24 NOTE — Telephone Encounter (Signed)
Pt called to say MD raised the dosage of the: lisinopril (ZESTRIL) 20 MG tablet From 10 mg to 20 mg  Pt would like a call back to discuss why.  [phone]

## 2022-02-27 NOTE — Telephone Encounter (Signed)
Lvm for patient to return call to office.   Last colonoscopy 2019

## 2022-02-27 NOTE — Telephone Encounter (Signed)
Spoke with patient about below message.  Voiced understanding

## 2022-05-05 NOTE — Progress Notes (Signed)
Patient ID: Gary [name], male   DOB: Aug 31, 1935, 87 y.o.   MRN: [id number]       HPI: Gary [name], is a 87 y.o. male who presents for a 78 month F/U sleep evaluation   [name]  is a patient of Dr. Sallyanne Kuster.  He is status post cardioversion for atrial flutter, and has a history of hyperlipidemia and aortic valve sclerosis.  He was referred for a sleep study which was done in May 2016 which revealed moderate obstructive sleep apnea with an AHI of 15.9 overall and 20.5 per hour during REM sleep.  He had reduced sleep efficiency at 54.8%.  Latency to sleep onset was prolonged at 36 minutes.  There was mild snoring.  Oxygen desaturated to 89%.  He underwent a CPAP titration trial and adnexal response to CPAP therapy with the initial recommended CPAP pressure 9 cm water pressure.  Due to oral venting, a nasal pillow mask with chinstrap was recommended.  Since initiating CPAP therapy, he has felt well.  He typically goes to bed at 10:30 and wakes up the proximal he 7:30 AM.  A download was obtained from 11/22/2014 through 12/21/2014.  Compliance was excellent with 100% days used and 100% of days used greater than 4 hours.  He is averaging 7 hours and 30 minutes of sleep per night.  On his 78 m water pressure, AHI is excellent at 0.8.  There is no significant mask leak and he has a ResMed AirFit P10 medium size mask.  A download was obtained from 11/28/2015 through 12/27/2015.  This confirms 100% compliance both with usage stays and greater than 4 hours of use.  He is averaging 7 hours and 22 minutes of use per night.  He is set at a 9 cm water pressure.  AHI remains excellent at 1.2 per hour.  He does have increased leak with 95th percent average at 30.0.  Despite this mild leak newborn nasal pillows.  His AHI remains excellent and undoubtedly is probably slightly less than its current reading.  He denied residual snoring.  Sleep is restorative.  He denied daytime sleepiness or bruxism.[name]   He was  found to have increased periodic limb movements on his initial sleep study and had increased PLMS index of 19.7, but denies painful restless legs or the "urge to move."  Epworth Sleepiness Scale: Situation   Chance of Dozing/Sleeping (0 = never , 1 = slight chance , 2 = moderate chance , 3 = high chance )   sitting and reading 1   watching TV 1   sitting inactive in a public place 0   being a passenger in a motor vehicle for an hour or more 1   lying down in the afternoon 2   sitting and talking to someone 0   sitting quietly after lunch (no alcohol) 0   while stopped for a few minutes in traffic as the driver 0   Total Score  5   I saw him in October 2018 at which time he continued to have 100% compliance was CPAP therapy.  He was averaging 6 hours and 26 minutes per night of use.  At a set pressure of 9 cm, AHI was excellent at 0.7.  He has a Special educational needs teacher P50mdium-size with chinstrap mask.  There was minimal leak intermittently.  An Epworth Sleepiness Scale score was recalculated and this endorsed at 4.   He was levaluated by me in November 2019 at which time he continued  to do well with reference to his sleep apnea. A new download was obtained in the office today from October 11 through March 09, 2018.  He continued to be compliant and is averaging 7 hours and 36 minutes of CPAP use per night.  AHI remains excellent at 0.7.  At times he has had some irritation from his mask.  His sleep is restorative.  He is unaware of breakthrough snoring.  He continues to have atrial flutter which is chronic and is followed by Dr. Sallyanne Kuster.    He developed COVID-19 infection in December, 2020 and was last seen in our office in January 2021 by [name], PA.  He was having some palpitations.  I last saw him in November 2019.  At that time he continued to be on CPAP therapy with excellent compliane. A download was obtained from August 18, 2019 through Sep 16, 2019 which confirms 100% compliance with average  usage 7 hours and 14 minutes.  At 9 cm set pressure, AHI is excellent at 0.5.  He has a DreamWear nasal mask.  He is sleeping well.  He wasunaware of breakthrough snoring.  He typically goes to bed between 11 and 1130 and wakes up at 7:30 AM.  Blood pressure is recently been slightly increased.    He saw Dr. Sallyanne Kuster November 2021 and remained stable with asymptomatic and spontaneously rate controlled atrial flutter.  He was not having symptoms of bradycardia and it was not felt he needed a pacemaker.  He had continued to be on Eliquis.  I last saw him on Sep 16, 2020.  He was continuing to use CPAP.  His CPAP unit is in ResMed air sense 10 auto unit at 9 cm set pressure.  With his older unit having only 3G capability, effective mid April, we are not able to access his data wirelessly since only 5G units are now able to be accessed.  However, download was obtained from March 1 through August 08, 2020 which shows excellent compliance with average sleep at 7 hours and 31 minutes with CPAP and 98% of usage days.  At 9 cm set pressure, AHI is 0.5.  He has a DreamWear mask.  He is exercising 2 days/week with a trainer.  He has noticed his blood pressure being elevated at home.    Since I last saw him, he has continued to be followed by Dr. Sallyanne Kuster and last saw him in February 2023.  From a sleep perspective, he has continued to use CPAP.  His machine is no longer wireless since his set up date was in June 2016 and technology is now 5G.  He is in need for a new machine and also will need to change his DME company from [place] to another company based on his insurance.  He brought his machine to the office today.  A download was obtained from December 7 through May 05, 2022.  Usage is 100%.  At 9 cm water pressure AHI is excellent at 0.6 events per hour.  He typically goes to bed between 1130 and midnight and wakes up between 8 and 8:30.  Average use was 7 hours and 3 minutes.  He presents for  reevaluation.  Past Medical History:  Diagnosis Date   Adenomatous colon polyp 06/2000   ALLERGIC RHINITIS 10/03/2007   Allergy    seasonal   Arthritis    knees   Atrial flutter ([place]) 06/26/2014   Cancer ([place])    skin cancers removed in  the past basal cell and squamous cell   CARDIAC MURMUR, AORTIC 10/03/2007   ERECTILE DYSFUNCTION 10/03/2007   High blood pressure 03/2016   HYPERLIPIDEMIA 10/03/2007   Rosacea    Sleep apnea     Past Surgical History:  Procedure Laterality Date   ADENOIDECTOMY     CARDIOVERSION N/A 07/01/2014   Procedure: CARDIOVERSION;  Surgeon: [name], MD;  Location: [place];  Service: Cardiovascular;  Laterality: N/A;   COLONOSCOPY     PALATE SURGERY     TEE WITHOUT CARDIOVERSION N/A 07/01/2014   Procedure: TRANSESOPHAGEAL ECHOCARDIOGRAM (TEE);  Surgeon: [name], MD;  Location: [place];  Service: Cardiovascular;  Laterality: N/A;   TONSILLECTOMY     TOTAL KNEE ARTHROPLASTY Right 08/27/2017   Procedure: RIGHT TOTAL KNEE ARTHROPLASTY;  Surgeon: Vickey Huger, MD;  Location: [place];  Service: Orthopedics;  Laterality: Right;    No Known Allergies  Current Outpatient Medications  Medication Sig Dispense Refill   allopurinol (ZYLOPRIM) 100 MG tablet TAKE 2 TABLETS EVERY DAY 120 tablet 5   amLODipine (NORVASC) 5 MG tablet Take 1 tablet (5 mg total) by mouth daily. 90 tablet 3   apixaban (ELIQUIS) 5 MG TABS tablet Take 1 tablet (5 mg total) by mouth 2 (two) times daily. 14 tablet 0   co-enzyme Q-10 30 MG capsule Take 30 mg by mouth every other day.      fluticasone (FLONASE) 50 MCG/ACT nasal spray Place 1 spray into both nostrils every other day. 16 g 4   lisinopril (ZESTRIL) 20 MG tablet Take 1 tablet (20 mg total) by mouth daily. 90 tablet 3   minocycline (MINOCIN,DYNACIN) 100 MG capsule Take 100 mg by mouth every other day.      Multiple Vitamin (MULTIVITAMIN) tablet Take 1 tablet by mouth daily.     simvastatin (ZOCOR) 20 MG tablet TAKE 1 TABLET  EVERY OTHER DAY 45 tablet 3   tamsulosin (FLOMAX) 0.4 MG CAPS capsule Take 1 capsule (0.4 mg total) by mouth daily. 90 capsule 3   Current Facility-Administered Medications  Medication Dose Route Frequency Provider Last Rate Last Admin   0.9 %  sodium chloride infusion  500 mL Intravenous Once Ladene Artist, MD        Social History   Socioeconomic History   Marital status: Married    Spouse name: Not on file   Number of children: 2   Years of education: Not on file   Highest education level: Not on file  Occupational History   Occupation: IT trainer company    Comment: retired  Tobacco Use   Smoking status: Former   Smokeless tobacco: Never  Scientific laboratory technician Use: Never used  Substance and Sexual Activity   Alcohol use: Yes    Alcohol/week: 8.0 standard drinks of alcohol    Types: 8 Glasses of wine per week    Comment: ocassionally    Drug use: No   Sexual activity: Not on file  Other Topics Concern   Not on file  Social History Narrative   Lives with wife in two story house, but occupies main level mostly   Has 4 children, two biological, two stepchildren, 9 grandkids   One son local, supportive    Active playing golf with friends, goes to Physiological scientist 2 days/week at [employer]   Enjoys reading, soduku   Social Determinants of Health   Financial Resource Strain: Low Risk  (05/20/2021)   Overall Financial Resource Strain (CARDIA)  Difficulty of Paying Living Expenses: Not hard at all  Food Insecurity: No Food Insecurity (05/20/2021)   Hunger Vital Sign    Worried About Running Out of Food in the Last Year: Never true    Ran Out of Food in the Last Year: Never true  Transportation Needs: No Transportation Needs (05/20/2021)   PRAPARE - Hydrologist (Medical): No    Lack of Transportation (Non-Medical): No  Physical Activity: Insufficiently Active (05/20/2021)   Exercise Vital Sign    Days of Exercise per Week: 2  days    Minutes of Exercise per Session: 30 min  Stress: No Stress Concern Present (05/20/2021)   [place]    Feeling of Stress : Not at all  Social Connections: [place] (05/20/2021)   Social Connection and Isolation Panel [NHANES]    Frequency of Communication with Friends and Family: More than three times a week    Frequency of Social Gatherings with Friends and Family: More than three times a week    Attends Religious Services: More than 4 times per year    Active Member of [employer] or Organizations: Yes    Attends Music therapist: More than 4 times per year    Marital Status: Married  Human resources officer Violence: Not At Risk (05/20/2021)   Humiliation, Afraid, Rape, and Kick questionnaire    Fear of Current or Ex-Partner: No    Emotionally Abused: No    Physically Abused: No    Sexually Abused: No    Family History  Problem Relation Age of Onset   Diabetes Mother    Cancer Father        liver ca   Liver cancer Father    Hypotension Sister    CAD Brother    Stomach cancer Paternal Aunt      ROS General: Negative; No fevers, chills, or night sweats HEENT: Negative; No changes in vision or hearing, sinus congestion, difficulty swallowing Pulmonary: Negative; No cough, wheezing, shortness of breath, hemoptysis Cardiovascular: History of atrial flutter GI: Negative; No nausea, vomiting, diarrhea, or abdominal pain GU: Negative; No dysuria, hematuria, or difficulty voiding Musculoskeletal: Negative; no myalgias, joint pain, or weakness Hematologic: Negative; no easy bruising, bleeding Endocrine: Negative; no heat/cold intolerance Neuro: Negative; no changes in balance, headaches Skin: Negative; No rashes or skin lesions Psychiatric: Negative; No behavioral problems, depression Sleep: See history of present illness   Physical Exam BP 138/64 (BP Location: Left Arm, Patient Position:  Sitting, Cuff Size: Large)   Pulse (!) 58   Ht '6\' 3"'$  (1.905 m)   Wt 259 lb 3.2 oz (117.6 kg)   SpO2 95%   BMI 32.40 kg/m    Repeat blood pressure by me was 130/72  Wt Readings from Last 3 Encounters:  05/08/22 259 lb 3.2 oz (117.6 kg)  02/13/22 253 lb 1.6 oz (114.8 kg)  01/06/22 258 lb (117 kg)   General: Alert, oriented, no distress.  Skin: normal turgor, no rashes, warm and dry HEENT: Normocephalic, atraumatic. Pupils equal round and reactive to light; sclera anicteric; extraocular muscles intact; Nose without nasal septal hypertrophy Mouth/Parynx benign; Mallinpatti scale s/p UPPP Neck: No JVD, no carotid bruits; normal carotid upstroke Lungs: clear to ausculatation and percussion; no wheezing or rales Chest wall: without tenderness to palpitation Heart: PMI not displaced,bradycardiac in the 50s, s1 s2 normal, 1/6 systolic murmur, no diastolic murmur, no rubs, gallops, thrills, or heaves Abdomen: soft, nontender;  no hepatosplenomehaly, BS+; abdominal aorta nontender and not dilated by palpation. Back: no CVA tenderness Pulses 2+ Musculoskeletal: full range of motion, normal strength, no joint deformities Extremities: 1+ bilsteral ankle swelling with support socks; no clubbing cyanosis or edema, Homan's sign negative  Neurologic: grossly nonfocal; Cranial nerves grossly wnl Psychologic: Normal mood and affect    May 08, 2022 ECG (independently read by me): Atrial flutter at 54 with variable block  Sep 16, 2020 ECG (independently read by me): Atrial flutter at 59, variable block, predominantly 4:1  May 13, 2019 ECG (independently read by me): Atrial flutter with variable block at 56  November 2019 ECG (independently read by me): Atrial flutter at 56 bpm with variable block.  October 2018 ECG (independently read by me): Atrial flutter with a ventricular rate at 70 bpm.  Nonspecific ST changes.  QTc interval 4o6 ms.  LABS:     Latest Ref Rng & Units 02/13/2022     9:16 AM 02/27/2021    3:17 PM 02/14/2021   11:25 AM  BMP  Glucose 70 - 99 mg/dL 108  98  108   BUN 6 - 23 mg/dL 38  42  33   Creatinine 0.40 - 1.50 mg/dL 1.28  1.36  1.25   Sodium 135 - 145 mEq/L 140  137  139   Potassium 3.5 - 5.1 mEq/L 4.6  4.6  4.8   Chloride 96 - 112 mEq/L 106  104  108   CO2 19 - 32 mEq/L '25  25  26   '$ Calcium 8.4 - 10.5 mg/dL 9.0  8.8  8.6         Latest Ref Rng & Units 02/13/2022    9:16 AM 02/02/2021   10:07 AM 07/26/2020    9:48 AM  Hepatic Function  Total Protein 6.0 - 8.3 g/dL 6.7  6.2  6.7   Albumin 3.5 - 5.2 g/dL 4.0  3.9  4.2   AST 0 - 37 U/L '18  17  15   '$ ALT 0 - 53 U/L '19  17  18   '$ Alk Phosphatase 39 - 117 U/L 61  53  70   Total Bilirubin 0.2 - 1.2 mg/dL 1.0  0.8  1.0   Bilirubin, Direct 0.0 - 0.3 mg/dL 0.2           Latest Ref Rng & Units 02/13/2022    9:16 AM 02/27/2021    3:17 PM 02/02/2021   10:07 AM  CBC  WBC 4.0 - 10.5 K/uL 5.8  6.3  5.4   Hemoglobin 13.0 - 17.0 g/dL 14.3  15.1  14.4   Hematocrit 39.0 - 52.0 % 43.0  44.9  43.0   Platelets 150.0 - 400.0 K/uL 137.0  138  142.0      Lipid Panel     Component Value Date/Time   CHOL 146 02/13/2022 0916   TRIG 77.0 02/13/2022 0916   HDL 38.70 (L) 02/13/2022 0916   CHOLHDL 4 02/13/2022 0916   VLDL 15.4 02/13/2022 0916   LDLCALC 92 02/13/2022 0916   LDLCALC 83 01/26/2020 0913   LDLDIRECT 143.5 01/22/2012 0916     RADIOLOGY: No results found.   IMPRESSION:  1. OSA (obstructive sleep apnea)   2. Essential hypertension   3. Typical atrial flutter ([place])   4. Long term current use of anticoagulant     ASSESSMENT AND PLAN: Mr. Hollenbeck is a 87 year-old gentleman who is a history of atrial flutter and is status post cardioversion.  He developed recurrent flutter with spontaneous rate control and has remained asymptomatic followed by Dr. Sallyanne Kuster.   He continues to be on anticoagulation with apixaban.  He was diagnosed with sleep apnea in 2016 at which time he received a ResMed  air sense 10 auto CPAP unit, currently set at 9 cm water pressure.  Over the last several years he is consistently been compliant with CPAP use.  Since April 2022, his CPAP unit is no longer wireless due to upgrade to [employer].  His machine is now 85 years old.  He continues to be compliant with CPAP use and at his current 9 cm set pressure AHI is 0.6 from a download of November 7 through May 05, 2022.  [facility] is no longer in the CPAP business and as result we will need to transition him to a new DME company based on his insurance.  He qualifies for a new machine and we will schedule him to have a new ResMed AirSense 11 CPAP auto unit.  His blood pressure today is stable.  He continues to be permanent atrial flutter with ventricular rate in the 50s.  He remains active and is working out at least 2 days/week walking on a treadmill.  I will need to see him within 90 days of him getting his new CPAP unit.  I will tentatively set him up for follow-up evaluation in 4 months.   Troy Sine, MD, [facility]  05/12/2022 10:31 AM

## 2022-05-08 ENCOUNTER — Encounter: Payer: Self-pay | Admitting: Cardiovascular Disease

## 2022-05-08 ENCOUNTER — Ambulatory Visit: Payer: Medicare HMO | Attending: Cardiovascular Disease | Admitting: Cardiovascular Disease

## 2022-05-08 DIAGNOSIS — I483 Typical atrial flutter: Secondary | ICD-10-CM | POA: Diagnosis not present

## 2022-05-08 DIAGNOSIS — G4733 Obstructive sleep apnea (adult) (pediatric): Secondary | ICD-10-CM

## 2022-05-08 DIAGNOSIS — Z7901 Long term (current) use of anticoagulants: Secondary | ICD-10-CM

## 2022-05-08 DIAGNOSIS — I10 Essential (primary) hypertension: Secondary | ICD-10-CM | POA: Diagnosis not present

## 2022-05-08 NOTE — Patient Instructions (Signed)
Medication Instructions:  No changes *If you need a refill on your cardiac medications before your next appointment, please call your pharmacy*   Lab Work: No labs If you have labs (blood work) drawn today and your tests are completely normal, you will receive your results only by: [place] (if you have MyChart) OR A paper copy in the mail If you have any lab test that is abnormal or we need to change your treatment, we will call you to review the results.   Testing/Procedures: None   Follow-Up: At [facility] you and your health needs are our priority.  As part of our continuing mission to provide you with exceptional heart care, we have created designated Provider Care Teams.  These Care Teams include your primary Cardiologist (physician) and Advanced Practice Providers (APPs -  Physician Assistants and Nurse Practitioners) who all work together to provide you with the care you need, when you need it.  We recommend signing up for the patient portal called "MyChart".  Sign up information is provided on this After Visit Summary.  MyChart is used to connect with patients for Virtual Visits (Telemedicine).  Patients are able to view lab/test results, encounter notes, upcoming appointments, etc.  Non-urgent messages can be sent to your provider as well.   To learn more about what you can do with MyChart, go to [email or web address].    Your next appointment:   4-5 months [facility]   The format for your next appointment:   In Person  Provider:   Dr Shelva Majestic    Other Instructions None  Important Information About [employer]

## 2022-05-12 ENCOUNTER — Encounter: Payer: Self-pay | Admitting: Cardiovascular Disease

## 2022-05-17 ENCOUNTER — Telehealth: Payer: Self-pay | Admitting: *Deleted

## 2022-05-17 DIAGNOSIS — L821 Other seborrheic keratosis: Secondary | ICD-10-CM | POA: Diagnosis not present

## 2022-05-17 DIAGNOSIS — L720 Epidermal cyst: Secondary | ICD-10-CM | POA: Diagnosis not present

## 2022-05-17 DIAGNOSIS — L820 Inflamed seborrheic keratosis: Secondary | ICD-10-CM | POA: Diagnosis not present

## 2022-05-17 DIAGNOSIS — L570 Actinic keratosis: Secondary | ICD-10-CM | POA: Diagnosis not present

## 2022-05-17 DIAGNOSIS — Z85828 Personal history of other malignant neoplasm of skin: Secondary | ICD-10-CM | POA: Diagnosis not present

## 2022-05-17 DIAGNOSIS — D485 Neoplasm of uncertain behavior of skin: Secondary | ICD-10-CM | POA: Diagnosis not present

## 2022-05-17 DIAGNOSIS — D692 Other nonthrombocytopenic purpura: Secondary | ICD-10-CM | POA: Diagnosis not present

## 2022-05-17 DIAGNOSIS — C44612 Basal cell carcinoma of skin of right upper limb, including shoulder: Secondary | ICD-10-CM | POA: Diagnosis not present

## 2022-05-17 NOTE — Telephone Encounter (Signed)
Replacement CPAP order sent to [facility] via [facility] portal.

## 2022-05-23 ENCOUNTER — Telehealth: Payer: Self-pay

## 2022-05-23 ENCOUNTER — Telehealth: Payer: Medicare HMO

## 2022-05-23 NOTE — Telephone Encounter (Signed)
-----  Message from Eulas Post, MD sent at 05/23/2022 12:22 PM EST ----- Patient complaining of low blood pressure.  I would have him hold his amlodipine for now and continue to monitor blood pressure closely.  Set up an office follow-up within the next couple months to reassess ----- Message ----- From: Nilda Riggs, CMA Sent: 05/23/2022  12:04 PM EST To: Eulas Post, MD   ----- Message ----- From: Jacqlyn Krauss Sent: 05/23/2022  11:57 AM EST To: [facility]  Please abstract and route to provider.

## 2022-05-23 NOTE — Telephone Encounter (Signed)
Patient informed of the message from PCP and patient expressed understanding

## 2022-05-23 NOTE — Telephone Encounter (Signed)
Patient called into the office this morning and spoke with a triage nurse. Patient reported he has been experiencing lightheadedness. Patient reported his blood pressure has been running low and has gotten readings around 99/56 and 109/56. Patient has upcoming appointment scheduled for 05/26/2022

## 2022-05-23 NOTE — Telephone Encounter (Signed)
Patient informed of the message below and verbalized understanding.  

## 2022-05-26 ENCOUNTER — Ambulatory Visit (INDEPENDENT_AMBULATORY_CARE_PROVIDER_SITE_OTHER): Payer: Medicare HMO | Admitting: Family Medicine

## 2022-05-26 ENCOUNTER — Encounter: Payer: Self-pay | Admitting: Family Medicine

## 2022-05-26 ENCOUNTER — Ambulatory Visit (INDEPENDENT_AMBULATORY_CARE_PROVIDER_SITE_OTHER): Payer: Medicare HMO

## 2022-05-26 VITALS — Ht 75.0 in | Wt 246.0 lb

## 2022-05-26 VITALS — BP 112/60 | HR 64 | Temp 97.9°F | Ht 75.0 in | Wt 246.3 lb

## 2022-05-26 DIAGNOSIS — Z0000 Encounter for general adult medical examination without abnormal findings: Secondary | ICD-10-CM | POA: Diagnosis not present

## 2022-05-26 DIAGNOSIS — I10 Essential (primary) hypertension: Secondary | ICD-10-CM

## 2022-05-26 DIAGNOSIS — R809 Proteinuria, unspecified: Secondary | ICD-10-CM | POA: Diagnosis not present

## 2022-05-26 DIAGNOSIS — R42 Dizziness and giddiness: Secondary | ICD-10-CM

## 2022-05-26 DIAGNOSIS — R7301 Impaired fasting glucose: Secondary | ICD-10-CM

## 2022-05-26 LAB — HEMOGLOBIN A1C: Hgb A1c MFr Bld: 6.3 % (ref 4.6–6.5)

## 2022-05-26 LAB — MICROALBUMIN / CREATININE URINE RATIO
Creatinine,U: 107.4 mg/dL
Microalb Creat Ratio: 46.3 mg/g — ABNORMAL HIGH (ref 0.0–30.0)
Microalb, Ur: 49.7 mg/dL — ABNORMAL HIGH (ref 0.0–1.9)

## 2022-05-26 NOTE — Patient Instructions (Signed)
[name] , Thank you for taking time to come for your Medicare Wellness Visit. I appreciate your ongoing commitment to your health goals. Please review the following plan we discussed and let me know if I can assist you in the future.   These are the goals we discussed:  Goals      Patient Stated     Continue to lose weight, with goal of 225lb-235lb by next year..        This is a list of the screening recommended for you and due dates:  Health Maintenance  Topic Date Due   COVID-19 Vaccine (3 - 2023-24 season) 12/30/2021   Hemoglobin A1C  08/15/2022   Colon Cancer Screening  01/31/2023   Eye exam for diabetics  02/10/2023   Yearly kidney function blood test for diabetes  02/14/2023   Yearly kidney health urinalysis for diabetes  02/14/2023   Medicare Annual Wellness Visit  05/27/2023   DTaP/Tdap/Td vaccine (3 - Td or Tdap) 05/13/2026   Pneumonia Vaccine  Completed   Flu Shot  Completed   Zoster (Shingles) Vaccine  Completed   HPV Vaccine  Aged Out   Complete foot exam   Discontinued    Advanced directives: yes   Conditions/risks identified: none  Next appointment: Follow up in one year for your annual wellness visit. 05/30/2023 '@10'$ :30 telephone  Preventive Care 65 Years and Older, Male  Preventive care refers to lifestyle choices and visits with your health care provider that can promote health and wellness. What does preventive care include? A yearly physical exam. This is also called an annual well check. Dental exams once or twice a year. Routine eye exams. Ask your health care provider how often you should have your eyes checked. Personal lifestyle choices, including: Daily care of your teeth and gums. Regular physical activity. Eating a healthy diet. Avoiding tobacco and drug use. Limiting alcohol use. Practicing safe sex. Taking low doses of aspirin every day. Taking vitamin and mineral supplements as recommended by your health care provider. What happens  during an annual well check? The services and screenings done by your health care provider during your annual well check will depend on your age, overall health, lifestyle risk factors, and family history of disease. Counseling  Your health care provider may ask you questions about your: Alcohol use. Tobacco use. Drug use. Emotional well-being. Home and relationship well-being. Sexual activity. Eating habits. History of falls. Memory and ability to understand (cognition). Work and work Statistician. Screening  You may have the following tests or measurements: Height, weight, and BMI. Blood pressure. Lipid and cholesterol levels. These may be checked every 5 years, or more frequently if you are over 52 years old. Skin check. Lung cancer screening. You may have this screening every year starting at age 58 if you have a 30-pack-year history of smoking and currently smoke or have quit within the past 15 years. Fecal occult blood test (FOBT) of the stool. You may have this test every year starting at age 67. Flexible sigmoidoscopy or colonoscopy. You may have a sigmoidoscopy every 5 years or a colonoscopy every 10 years starting at age 38. Prostate cancer screening. Recommendations will vary depending on your family history and other risks. Hepatitis C blood test. Hepatitis B blood test. Sexually transmitted disease (STD) testing. Diabetes screening. This is done by checking your blood sugar (glucose) after you have not eaten for a while (fasting). You may have this done every 1-3 years. Abdominal aortic aneurysm (AAA) screening.  You may need this if you are a current or former smoker. Osteoporosis. You may be screened starting at age 66 if you are at high risk. Talk with your health care provider about your test results, treatment options, and if necessary, the need for more tests. Vaccines  Your health care provider may recommend certain vaccines, such as: Influenza vaccine. This is  recommended every year. Tetanus, diphtheria, and acellular pertussis (Tdap, Td) vaccine. You may need a Td booster every 10 years. Zoster vaccine. You may need this after age 45. Pneumococcal 13-valent conjugate (PCV13) vaccine. One dose is recommended after age 32. Pneumococcal polysaccharide (PPSV23) vaccine. One dose is recommended after age 71. Talk to your health care provider about which screenings and vaccines you need and how often you need them. This information is not intended to replace advice given to you by your health care provider. Make sure you discuss any questions you have with your health care provider. Document Released: 05/14/2015 Document Revised: 01/05/2016 Document Reviewed: 02/16/2015 Elsevier Interactive Patient Education  2017 [employer] Prevention in the Home Falls can cause injuries. They can happen to people of all ages. There are many things you can do to make your home safe and to help prevent falls. What can I do on the outside of my home? Regularly fix the edges of walkways and driveways and fix any cracks. Remove anything that might make you trip as you walk through a door, such as a raised step or threshold. Trim any bushes or trees on the path to your home. Use bright outdoor lighting. Clear any walking paths of anything that might make someone trip, such as rocks or tools. Regularly check to see if handrails are loose or broken. Make sure that both sides of any steps have handrails. Any raised decks and porches should have guardrails on the edges. Have any leaves, snow, or ice cleared regularly. Use sand or salt on walking paths during winter. Clean up any spills in your garage right away. This includes oil or grease spills. What can I do in the bathroom? Use night lights. Install grab bars by the toilet and in the tub and shower. Do not use towel bars as grab bars. Use non-skid mats or decals in the tub or shower. If you need to sit down in  the shower, use a plastic, non-slip stool. Keep the floor dry. Clean up any water that spills on the floor as soon as it happens. Remove soap buildup in the tub or shower regularly. Attach bath mats securely with double-sided non-slip rug tape. Do not have throw rugs and other things on the floor that can make you trip. What can I do in the bedroom? Use night lights. Make sure that you have a light by your bed that is easy to reach. Do not use any sheets or blankets that are too big for your bed. They should not hang down onto the floor. Have a firm chair that has side arms. You can use this for support while you get dressed. Do not have throw rugs and other things on the floor that can make you trip. What can I do in the kitchen? Clean up any spills right away. Avoid walking on wet floors. Keep items that you use a lot in easy-to-reach places. If you need to reach something above you, use a strong step stool that has a grab bar. Keep electrical cords out of the way. Do not use floor polish or wax  that makes floors slippery. If you must use wax, use non-skid floor wax. Do not have throw rugs and other things on the floor that can make you trip. What can I do with my stairs? Do not leave any items on the stairs. Make sure that there are handrails on both sides of the stairs and use them. Fix handrails that are broken or loose. Make sure that handrails are as long as the stairways. Check any carpeting to make sure that it is firmly attached to the stairs. Fix any carpet that is loose or worn. Avoid having throw rugs at the top or bottom of the stairs. If you do have throw rugs, attach them to the floor with carpet tape. Make sure that you have a light switch at the top of the stairs and the bottom of the stairs. If you do not have them, ask someone to add them for you. What else can I do to help prevent falls? Wear shoes that: Do not have high heels. Have rubber bottoms. Are comfortable  and fit you well. Are closed at the toe. Do not wear sandals. If you use a stepladder: Make sure that it is fully opened. Do not climb a closed stepladder. Make sure that both sides of the stepladder are locked into place. Ask someone to hold it for you, if possible. Clearly mark and make sure that you can see: Any grab bars or handrails. First and last steps. Where the edge of each step is. Use tools that help you move around (mobility aids) if they are needed. These include: Canes. Walkers. Scooters. Crutches. Turn on the lights when you go into a dark area. Replace any light bulbs as soon as they burn out. Set up your furniture so you have a clear path. Avoid moving your furniture around. If any of your floors are uneven, fix them. If there are any pets around you, be aware of where they are. Review your medicines with your doctor. Some medicines can make you feel dizzy. This can increase your chance of falling. Ask your doctor what other things that you can do to help prevent falls. This information is not intended to replace advice given to you by your health care provider. Make sure you discuss any questions you have with your health care provider. Document Released: 02/11/2009 Document Revised: 09/23/2015 Document Reviewed: 05/22/2014 Elsevier Interactive Patient Education  2017 [employer].

## 2022-05-26 NOTE — Progress Notes (Signed)
Virtual Visit via Telephone Note  I connected with  Gary [name] on 05/26/22 at 11:00 AM EST by telephone and verified that I am speaking with the correct person using two identifiers.  Location: Patient: home Provider: [facility] Persons participating in the virtual visit: patient/Nurse Health Advisor   I discussed the limitations, risks, security and privacy concerns of performing an evaluation and management service by telephone and the availability of in person appointments. The patient expressed understanding and agreed to proceed.  Interactive audio and video telecommunications were attempted between this nurse and patient, however failed, due to patient having technical difficulties OR patient did not have access to video capability.  We continued and completed visit with audio only.  Some vital signs may be absent or patient reported.   Roger Shelter, LPN  Subjective:   Gary [name] is a 86 y.o. male who presents for Medicare Annual/Subsequent preventive examination.  Review of Systems     Cardiac Risk Factors include: advanced age (>70mn, >>53women);male gender;hypertension;dyslipidemia     Objective:    Today's Vitals   05/26/22 1107  Weight: 246 lb (111.6 kg)  Height: '6\' 3"'$  (1.905 m)   Body mass index is 30.75 kg/m.     05/26/2022   11:25 AM 05/20/2021    8:25 AM 02/27/2021    3:00 PM 06/04/2020    2:16 PM 11/26/2019   10:47 AM 07/10/2018    8:38 AM 08/27/2017    3:37 PM  Advanced Directives  Does Patient Have a Medical Advance Directive? Yes Yes Yes Yes Yes Yes Yes  Type of AParamedicof AArrowsmithLiving will HCheritonLiving will HWalworthLiving will Living will;Healthcare Power of AOak LevelLiving will HZellwoodLiving will  Does patient want to make changes to medical advance directive?  No - Patient declined    No -  Patient declined No - Patient declined  Copy of HPort [place]in Chart? No - copy requested No - copy requested  Yes - validated most recent copy scanned in chart (See row information) Yes - validated most recent copy scanned in chart (See row information) No - copy requested No - copy requested  Would patient like information on creating a medical advance directive?       No - Patient declined    Current Medications (verified) Outpatient Encounter Medications as of 05/26/2022  Medication Sig   allopurinol (ZYLOPRIM) 100 MG tablet TAKE 2 TABLETS EVERY DAY   amLODipine (NORVASC) 5 MG tablet Take 1 tablet (5 mg total) by mouth daily.   apixaban (ELIQUIS) 5 MG TABS tablet Take 1 tablet (5 mg total) by mouth 2 (two) times daily.   co-enzyme Q-10 30 MG capsule Take 30 mg by mouth every other day.    fluticasone (FLONASE) 50 MCG/ACT nasal spray Place 1 spray into both nostrils every other day.   lisinopril (ZESTRIL) 20 MG tablet Take 1 tablet (20 mg total) by mouth daily.   minocycline (MINOCIN,DYNACIN) 100 MG capsule Take 100 mg by mouth every other day.    Multiple Vitamin (MULTIVITAMIN) tablet Take 1 tablet by mouth daily.   simvastatin (ZOCOR) 20 MG tablet TAKE 1 TABLET EVERY OTHER DAY   tamsulosin (FLOMAX) 0.4 MG CAPS capsule Take 1 capsule (0.4 mg total) by mouth daily.   Facility-Administered Encounter Medications as of 05/26/2022  Medication   0.9 %  sodium chloride infusion  Allergies (verified) Patient has no known allergies.   History: Past Medical History:  Diagnosis Date   Adenomatous colon polyp 06/2000   ALLERGIC RHINITIS 10/03/2007   Allergy    seasonal   Arthritis    knees   Atrial flutter ([place]) 06/26/2014   Cancer ([place])    skin cancers removed in the past basal cell and squamous cell   CARDIAC MURMUR, AORTIC 10/03/2007   ERECTILE DYSFUNCTION 10/03/2007   High blood pressure 03/2016   HYPERLIPIDEMIA 10/03/2007   Rosacea    Sleep apnea    Past Surgical  History:  Procedure Laterality Date   ADENOIDECTOMY     CARDIOVERSION N/A 07/01/2014   Procedure: CARDIOVERSION;  Surgeon: [name], MD;  Location: [place];  Service: Cardiovascular;  Laterality: N/A;   COLONOSCOPY     PALATE SURGERY     TEE WITHOUT CARDIOVERSION N/A 07/01/2014   Procedure: TRANSESOPHAGEAL ECHOCARDIOGRAM (TEE);  Surgeon: [name], MD;  Location: [place];  Service: Cardiovascular;  Laterality: N/A;   TONSILLECTOMY     TOTAL KNEE ARTHROPLASTY Right 08/27/2017   Procedure: RIGHT TOTAL KNEE ARTHROPLASTY;  Surgeon: Vickey Huger, MD;  Location: [place];  Service: Orthopedics;  Laterality: Right;   Family History  Problem Relation Age of Onset   Diabetes Mother    Cancer Father        liver ca   Liver cancer Father    Hypotension Sister    CAD Brother    Stomach cancer Paternal Aunt    Social History   Socioeconomic History   Marital status: Widowed    Spouse name: Not on file   Number of children: 2   Years of education: Not on file   Highest education level: Not on file  Occupational History   Occupation: IT trainer company    Comment: retired  Tobacco Use   Smoking status: Former   Smokeless tobacco: Never  Scientific laboratory technician Use: Never used  Substance and Sexual Activity   Alcohol use: Yes    Alcohol/week: 8.0 standard drinks of alcohol    Types: 8 Glasses of wine per week    Comment: ocassionally    Drug use: No   Sexual activity: Not Currently  Other Topics Concern   Not on file  Social History Narrative   Lives alone;wife deceased in the last year;two story house, but occupies main level mostly   Has 4 children, two biological, two stepchildren, 9 grandkids   One son local, supportive    Active playing golf with friends, goes to Physiological scientist 2 days/week at [employer]   Enjoys reading, soduku   Social Determinants of Health   Financial Resource Strain: Low Risk  (05/20/2021)   Overall Financial Resource  Strain (CARDIA)    Difficulty of Paying Living Expenses: Not hard at all  Food Insecurity: No Food Insecurity (05/20/2021)   Hunger Vital Sign    Worried About Running Out of Food in the Last Year: Never true    [place] in the Last Year: Never true  Transportation Needs: No Transportation Needs (05/20/2021)   PRAPARE - Hydrologist (Medical): No    Lack of Transportation (Non-Medical): No  Physical Activity: Insufficiently Active (05/26/2022)   Exercise Vital Sign    Days of Exercise per Week: 2 days    Minutes of Exercise per Session: 30 min  Stress: No Stress Concern Present (05/26/2022)   [place] -  Occupational Stress Questionnaire    Feeling of Stress : Not at all  Social Connections: Socially Integrated (05/20/2021)   Social Connection and Isolation Panel [NHANES]    Frequency of Communication with Friends and Family: More than three times a week    Frequency of Social Gatherings with Friends and Family: More than three times a week    Attends Religious Services: More than 4 times per year    Active Member of [employer] or Organizations: Yes    Attends Music therapist: More than 4 times per year    Marital Status: Married    Tobacco Counseling Counseling given: Not Answered   Clinical Intake:  Pre-visit preparation completed: Yes  Pain : No/denies pain     BMI - recorded: 30.75 Nutritional Status: BMI > 30  Obese Diabetes: No  How often do you need to have someone help you when you read instructions, pamphlets, or other written materials from your doctor or pharmacy?: 1 - Never  Diabetic?no  Interpreter Needed?: No  Information entered by :: B.Deberah Adolf,LPN   Activities of Daily Living    05/26/2022   11:26 AM  In your present state of health, do you have any difficulty performing the following activities:  Hearing? 0  Vision? 0  Difficulty concentrating or making decisions? 0   Walking or climbing stairs? 0  Dressing or bathing? 0  Doing errands, shopping? 0  Preparing Food and eating ? N  Using the Toilet? N  In the past six months, have you accidently leaked urine? N  Do you have problems with loss of bowel control? N  Managing your Medications? N  Managing your Finances? N  Housekeeping or managing your Housekeeping? N    Patient Care Team: Eulas Post, MD as PCP - General (Family Medicine) Croitoru, Dani Gobble, MD as PCP - Cardiology (Cardiology) Jarome Matin, MD as Consulting Physician (Dermatology) Troy Sine, MD as Consulting Physician (Cardiology) Desma Maxim, MD as Referring Physician (Ophthalmology) Viona Gilmore, [facility] (Inactive) as Pharmacist (Pharmacist)  Indicate any recent Medical Services you may have received from other than [facility] providers in the past year (date may be approximate).     Assessment:   This is a routine wellness examination for [place].  Hearing/Vision screen Hearing Screening - Comments:: Adequate hearing Vision Screening - Comments:: Readers:see adequate;Cataract surgery 41yr ago Dr SPrescott Gum Dietary issues and exercise activities discussed: Current Exercise Habits: Structured exercise class, Type of exercise: strength training/weights, Time (Minutes): 30, Frequency (Times/Week): 2, Weekly Exercise (Minutes/Week): 60, Intensity: Moderate, Exercise limited by: None identified   Goals Addressed   None    Depression Screen    02/13/2022    8:43 AM 08/22/2021    2:55 PM 05/20/2021    8:19 AM 06/04/2020    2:18 PM 07/14/2019    8:34 AM 07/10/2018    8:10 AM 06/14/2016   10:01 AM  PHQ 2/9 Scores  PHQ - 2 Score 0 0 0 0 0 0 0  PHQ- 9 Score  0  0  0     Fall Risk    05/26/2022   11:12 AM 02/13/2022    8:43 AM 08/22/2021    2:55 PM 05/20/2021    8:21 AM 06/04/2020    2:18 PM  FFruitvalein the past year? 0 0 0 0 0  Number falls in past yr: 0 0 0 0 0  Injury with Fall? 0 0 0 0 0  Risk for  fall  due to : No Fall Risks No Fall Risks No Fall Risks  No Fall Risks  Follow up Education provided;Falls prevention discussed Falls evaluation completed Falls evaluation completed  Falls evaluation completed;Falls prevention discussed    FALL RISK PREVENTION PERTAINING TO THE HOME:  Any stairs in or around the home? Yes  If so, are there any without handrails? Yes  Home free of loose throw rugs in walkways, pet beds, electrical cords, etc? Yes  Adequate lighting in your home to reduce risk of falls? Yes   ASSISTIVE DEVICES UTILIZED TO PREVENT FALLS:  Life alert? Yes  apple watch Use of a cane, walker or w/c? No  Grab bars in the bathroom? No  Shower chair or bench in shower? No  Elevated toilet seat or a handicapped toilet? No       05/26/2022   11:20 AM 05/20/2021    8:24 AM  6CIT Screen  What Year? 0 points 0 points  What month? 0 points 0 points  What time? 0 points 0 points  Count back from 20 0 points 0 points  Months in reverse 0 points 0 points  Repeat phrase 2 points 0 points  Total Score 2 points 0 points    Immunizations Immunization History  Administered Date(s) Administered   Fluad Quad(high Dose 65+) 01/17/2019, 01/26/2020, 02/02/2021, 02/13/2022   Influenza Split 01/22/2012   Influenza, High Dose Seasonal PF 03/13/2014, 05/31/2015, 03/14/2016, 03/18/2018   Influenza,inj,Quad PF,6+ Mos 04/15/2013   PFIZER(Purple Top)SARS-COV-2 Vaccination 06/02/2019, 06/29/2019   PNEUMOCOCCAL CONJUGATE-20 02/02/2021   Pneumococcal Conjugate-13 04/15/2013, 05/12/2014   Pneumococcal Polysaccharide-23 08/19/2004, 10/19/2009   Td 08/19/2004   Tdap 05/13/2016   Zoster Recombinat (Shingrix) 10/26/2019, 01/12/2020    TDAP status: Up to date  Flu Vaccine status: Up to date  Pneumococcal vaccine status: Up to date  Covid-19 vaccine status: Completed vaccines  Qualifies for Shingles Vaccine? Yes   Zostavax completed Yes   Shingrix Completed?: Yes  Screening Tests Health  Maintenance  Topic Date Due   COVID-19 Vaccine (3 - 2023-24 season) 12/30/2021   HEMOGLOBIN A1C  08/15/2022   COLONOSCOPY (Pts 45-53yr Insurance coverage will need to be confirmed)  01/31/2023   OPHTHALMOLOGY EXAM  02/10/2023   Diabetic kidney evaluation - eGFR measurement  02/14/2023   Diabetic kidney evaluation - Urine ACR  02/14/2023   Medicare Annual Wellness (AWV)  05/27/2023   DTaP/Tdap/Td (3 - Td or Tdap) 05/13/2026   Pneumonia Vaccine 85 Years old  Completed   INFLUENZA VACCINE  Completed   Zoster Vaccines- Shingrix  Completed   HPV VACCINES  Aged Out   FOOT EXAM  Discontinued    Health Maintenance  Health Maintenance Due  Topic Date Due   COVID-19 Vaccine (3 - 2023-24 season) 12/30/2021    Colorectal cancer screening: Type of screening: Colonoscopy. Completed 01/2018. Repeat every 5 years  Lung Cancer Screening: (Low Dose CT Chest recommended if Age 85-80years, 30 pack-year currently smoking OR have quit w/in 15years.) does not qualify.   Lung Cancer Screening Referral: no  Additional Screening:  Hepatitis C Screening: does not qualify; Completed no  Vision Screening: Recommended annual ophthalmology exams for early detection of glaucoma and other disorders of the eye. Is the patient up to date with their annual eye exam?  Yes  Who is the provider or what is the name of the office in which the patient attends annual eye exams? Dr SLucita FerraraIf pt is not established with a provider, would they like  to be referred to a provider to establish care? No .   Dental Screening: Recommended annual dental exams for proper oral hygiene  Community Resource Referral / Chronic Care Management: CRR required this visit?  No   CCM required this visit?  No      Plan:     I have personally reviewed and noted the following in the patient's chart:   Medical and social history Use of alcohol, tobacco or illicit drugs  Current medications and supplements including opioid  prescriptions. Patient is not currently taking opioid prescriptions. Functional ability and status Nutritional status Physical activity Advanced directives List of other physicians Hospitalizations, surgeries, and ER visits in previous 12 months Vitals Screenings to include cognitive, depression, and falls Referrals and appointments  In addition, I have reviewed and discussed with patient certain preventive protocols, quality metrics, and best practice recommendations. A written personalized care plan for preventive services as well as general preventive health recommendations were provided to patient.     Roger Shelter, LPN   9/92/[id number]   Nurse Notes: none

## 2022-05-26 NOTE — Progress Notes (Signed)
Established Patient Office Visit  Subjective   Patient ID: Gary [name], male    DOB: 16-Oct-1935  Age: 87 y.o. MRN: [id number]  Chief Complaint  Patient presents with   Medical Management of Chronic Issues   Dizziness    HPI   [name] had called earlier this week with episodes of dizziness.  He had blood pressures dipping below 626 systolic.  His recent history is that he went on a [employer] right after Christmas and states he is lost about 20 pounds.  We also had recently bumped his lisinopril from 10 to 20 mg because of some proteinuria on urine microalbumin screen.  We had also added amlodipine to his regimen last year because of persistent elevated blood pressure.  We had him hold his amlodipine 2 days ago and blood pressure since then have been much better and dizziness has resolved.  He is drinking about 24 ounces of fluid a day.  He has history of hyperglycemia.  Last A1c 6.4%.  No polyuria or polydipsia.  His other medical problems include history of atrial flutter, hypertension, obstructive sleep apnea, rosacea, hyperlipidemia  Past Medical History:  Diagnosis Date   Adenomatous colon polyp 06/2000   ALLERGIC RHINITIS 10/03/2007   Allergy    seasonal   Arthritis    knees   Atrial flutter ([place]) 06/26/2014   Cancer ([place])    skin cancers removed in the past basal cell and squamous cell   CARDIAC MURMUR, AORTIC 10/03/2007   ERECTILE DYSFUNCTION 10/03/2007   High blood pressure 03/2016   HYPERLIPIDEMIA 10/03/2007   Rosacea    Sleep apnea    Past Surgical History:  Procedure Laterality Date   ADENOIDECTOMY     CARDIOVERSION N/A 07/01/2014   Procedure: CARDIOVERSION;  Surgeon: [name], MD;  Location: [place];  Service: Cardiovascular;  Laterality: N/A;   COLONOSCOPY     PALATE SURGERY     TEE WITHOUT CARDIOVERSION N/A 07/01/2014   Procedure: TRANSESOPHAGEAL ECHOCARDIOGRAM (TEE);  Surgeon: [name], MD;  Location: [place];  Service:  Cardiovascular;  Laterality: N/A;   TONSILLECTOMY     TOTAL KNEE ARTHROPLASTY Right 08/27/2017   Procedure: RIGHT TOTAL KNEE ARTHROPLASTY;  Surgeon: Vickey Huger, MD;  Location: [place];  Service: Orthopedics;  Laterality: Right;    reports that he has quit smoking. He has never used smokeless tobacco. He reports current alcohol use of about 8.0 standard drinks of alcohol per week. He reports that he does not use drugs. family history includes CAD in his brother; Cancer in his father; Diabetes in his mother; Hypotension in his sister; Liver cancer in his father; Stomach cancer in his paternal aunt. No Known Allergies  Review of Systems  Constitutional:  Negative for chills, fever and malaise/fatigue.  Eyes:  Negative for blurred vision.  Respiratory:  Negative for shortness of breath.   Cardiovascular:  Negative for chest pain.  Gastrointestinal:  Negative for abdominal pain.  Neurological:  Positive for dizziness. Negative for focal weakness, loss of consciousness, weakness and headaches.      Objective:     BP 112/60 (BP Location: Left Arm, Patient Position: Sitting, Cuff Size: Normal)   Pulse 64   Temp 97.9 F (36.6 C) (Oral)   Ht '6\' 3"'$  (1.905 m)   Wt 246 lb 4.8 oz (111.7 kg)   SpO2 98%   BMI 30.79 kg/m    Physical Exam Vitals reviewed.  Constitutional:      Appearance: He is well-developed.  HENT:     Right Ear: External ear normal.     Left Ear: External ear normal.  Eyes:     Pupils: Pupils are equal, round, and reactive to light.  Neck:     Thyroid: No thyromegaly.  Cardiovascular:     Rate and Rhythm: Normal rate and regular rhythm.  Pulmonary:     Effort: Pulmonary effort is normal. No respiratory distress.     Breath sounds: Normal breath sounds. No wheezing or rales.  Musculoskeletal:     Cervical back: Neck supple.     Right lower leg: No edema.     Left lower leg: No edema.  Neurological:     Mental Status: He is alert and oriented to person, place, and  time.      No results found for any visits on 05/26/22.    The ASCVD Risk score (Arnett DK, et al., 2019) failed to calculate for the following reasons:   The 2019 ASCVD risk score is only valid for ages 22 to 22    Assessment & Plan:   #1 hypertension currently well-controlled on lisinopril 20 mg daily.  Continue close monitoring.  Continue to hold amlodipine for now  #2 recent orthostatic symptoms.  Combination of increasing his lisinopril and his weight loss likely contributed.  Orthostatic symptoms have resolved with holding amlodipine.  Continue to stay well-hydrated and change positions slowly.  Be in touch for any recurrent symptoms  #3 hyperglycemia/glucose intolerance.  Recheck A1c today.  Continue low glycemic diet.  Recheck urine microalbumin with recent increased microalbuminuria.  We did titrate up his lisinopril to 20 mg  Return in about 3 months (around 08/25/2022).    Carolann Littler, MD

## 2022-05-26 NOTE — Patient Instructions (Signed)
Stay well hydrated  Continue to HOLD the Amlodipine for now  Let me know if you continue to have any further episodes of dizziness.

## 2022-05-29 ENCOUNTER — Ambulatory Visit: Payer: Medicare HMO | Admitting: Family Medicine

## 2022-06-05 ENCOUNTER — Ambulatory Visit: Payer: Medicare HMO | Attending: Cardiovascular Disease | Admitting: Cardiovascular Disease

## 2022-06-05 ENCOUNTER — Encounter: Payer: Self-pay | Admitting: Cardiovascular Disease

## 2022-06-05 VITALS — BP 100/54 | HR 57 | Ht 75.0 in | Wt 249.6 lb

## 2022-06-05 DIAGNOSIS — I483 Typical atrial flutter: Secondary | ICD-10-CM

## 2022-06-05 DIAGNOSIS — G4733 Obstructive sleep apnea (adult) (pediatric): Secondary | ICD-10-CM

## 2022-06-05 DIAGNOSIS — E7800 Pure hypercholesterolemia, unspecified: Secondary | ICD-10-CM

## 2022-06-05 DIAGNOSIS — I10 Essential (primary) hypertension: Secondary | ICD-10-CM

## 2022-06-05 DIAGNOSIS — D6869 Other thrombophilia: Secondary | ICD-10-CM

## 2022-06-05 MED ORDER — LISINOPRIL 10 MG PO TABS: 10.0000 mg | ORAL_TABLET | Freq: Every day | ORAL | 3 refills | Status: DC

## 2022-06-05 NOTE — Progress Notes (Signed)
Cardiology Clinic Note   Patient Name: Gary [name] Date of Encounter: 06/07/2022  Primary Care Provider:  Eulas Post, MD Primary Cardiologist:  [name], MD  Chief Complaint  Patient presents with   Atrial Flutter      Gary [name] 87 year old male presents to the clinic today for follow-up evaluation of his essential hypertension, and atrial flutter.  Additional medical problems include obstructive sleep apnea, compliant with CPAP and essential hypertension.  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 and squamous cell   CARDIAC MURMUR, AORTIC 10/03/2007   ERECTILE DYSFUNCTION 10/03/2007   High blood pressure 03/2016   HYPERLIPIDEMIA 10/03/2007   Rosacea    Sleep apnea    Past Surgical History:  Procedure Laterality Date   ADENOIDECTOMY     CARDIOVERSION N/A 07/01/2014   Procedure: CARDIOVERSION;  Surgeon: [name], MD;  Location: [place];  Service: Cardiovascular;  Laterality: N/A;   COLONOSCOPY     PALATE SURGERY     TEE WITHOUT CARDIOVERSION N/A 07/01/2014   Procedure: TRANSESOPHAGEAL ECHOCARDIOGRAM (TEE);  Surgeon: [name], MD;  Location: [place];  Service: Cardiovascular;  Laterality: N/A;   TONSILLECTOMY     TOTAL KNEE ARTHROPLASTY Right 08/27/2017   Procedure: RIGHT TOTAL KNEE ARTHROPLASTY;  Surgeon: Vickey Huger, MD;  Location: [place];  Service: Orthopedics;  Laterality: Right;    Allergies  No Known Allergies  History of Present Illness    Gary [name] has a PMH of cardiac murmur, aortic stenosis, longstanding asymptomatic atrial flutter with spontaneously controlled ventricular rate.    He has not had any major health problems over the last year, but he did lose his wife in May.  He is still  living in the same house by himself.  He was admitted group of friends that offered support.  He has been exercising at the gym on a regular basis.  He has lost 20 pounds since presents.  His blood pressure is low today 100/54, but at home over reading is around 130/70.  When he exercises his smart watch has recorded he heart rates around 94 bpm.  His resting heart rate is around 50.  He has not had near-syncope or syncope.  He has reasonably good stamina.  He denies exertional dyspnea or chest pain, orthopnea, PND, lower extremity edema, focal neurological events, falls or serious bleeding.  He does have occasional orthostatic lightheadedness if he changes position quickly.  He stopped his amlodipine completely about 3 weeks ago for low blood pressure.  Home Medications    Prior to Admission medications   Medication Sig Start Date End Date Taking? Authorizing Provider  allopurinol (ZYLOPRIM) 100 MG tablet Take 2 tablets (200 mg total) by mouth daily. 02/07/21   Caren Macadam, MD  co-enzyme Q-10 30 MG capsule Take 30 mg by mouth every other day.     [provider]  ELIQUIS 5 MG TABS tablet TAKE 1 TABLET TWICE DAILY 01/17/21   Chrystal Zeimet, MD  fluticasone (FLONASE) 50 MCG/ACT nasal spray Place 1 spray into both nostrils every other day. 02/16/20   Koberlein, Steele Berg, MD  lisinopril (ZESTRIL) 5 MG tablet Take 2 tablets (10 mg total) by  mouth daily. 09/16/20   Troy Sine, MD  minocycline (MINOCIN,DYNACIN) 100 MG capsule Take 100 mg by mouth every other day.     [provider]  Multiple Vitamin (MULTIVITAMIN) tablet Take 1 tablet by mouth daily.    [provider]  simvastatin (ZOCOR) 20 MG tablet Take 1 tablet (20 mg total) by mouth every other day. 09/16/20   Caren Macadam, MD  tamsulosin (FLOMAX) 0.4 MG CAPS capsule TAKE 1 CAPSULE EVERY DAY 03/23/20   Caren Macadam, MD    Family History    Family History  Problem Relation Age of Onset    Diabetes Mother    Cancer Father        liver ca   Liver cancer Father    Hypotension Sister    CAD Brother    Stomach cancer Paternal Aunt    He indicated that his mother is deceased. He indicated that his father is deceased. He indicated that his sister is alive. He indicated that his brother is alive. He indicated that his maternal grandmother is deceased. He indicated that his maternal grandfather is deceased. He indicated that his paternal grandmother is deceased. He indicated that his paternal grandfather is deceased. He indicated that the status of his paternal aunt is unknown.   Social History    Social History   Socioeconomic History   Marital status: Widowed    Spouse name: Not on file   Number of children: 2   Years of education: Not on file   Highest education level: Not on file  Occupational History   Occupation: IT trainer company    Comment: retired  Tobacco Use   Smoking status: Former   Smokeless tobacco: Never  Scientific laboratory technician Use: Never used  Substance and Sexual Activity   Alcohol use: Yes    Alcohol/week: 8.0 standard drinks of alcohol    Types: 8 Glasses of wine per week    Comment: ocassionally    Drug use: No   Sexual activity: Not Currently  Other Topics Concern   Not on file  Social History Narrative   Lives alone;wife deceased in the last year;two story house, but occupies main level mostly   Has 4 children, two biological, two stepchildren, 9 grandkids   One son local, supportive    Active playing golf with friends, goes to Physiological scientist 2 days/week at [employer]   Enjoys reading, soduku   Social Determinants of Health   Financial Resource Strain: Low Risk  (05/20/2021)   Overall Financial Resource Strain (CARDIA)    Difficulty of Paying Living Expenses: Not hard at all  Food Insecurity: No Food Insecurity (05/20/2021)   Hunger Vital Sign    Worried About Running Out of Food in the Last Year: Never true    Bodfish in the Last Year: Never true  Transportation Needs: No Transportation Needs (05/20/2021)   PRAPARE - Hydrologist (Medical): No    Lack of Transportation (Non-Medical): No  Physical Activity: Insufficiently Active (05/26/2022)   Exercise Vital Sign    Days of Exercise per Week: 2 days    Minutes of Exercise per Session: 30 min  Stress: No Stress Concern Present (05/26/2022)   [place]    Feeling of Stress : Not at all  Social Connections: [place] (05/20/2021)   Social Connection and Isolation Panel [NHANES]    Frequency  of Communication with Friends and Family: More than three times a week    Frequency of Social Gatherings with Friends and Family: More than three times a week    Attends Religious Services: More than 4 times per year    Active Member of [employer] or Organizations: Yes    Attends Music therapist: More than 4 times per year    Marital Status: Married  Human resources officer Violence: Not At Risk (05/20/2021)   Humiliation, Afraid, Rape, and Kick questionnaire    Fear of Current or Ex-Partner: No    Emotionally Abused: No    Physically Abused: No    Sexually Abused: No     Review of Systems    General:  No chills, fever, night sweats or weight changes.  Cardiovascular:  No chest pain, dyspnea on exertion, edema, orthopnea, palpitations, paroxysmal nocturnal dyspnea. Dermatological: No rash, lesions/masses Respiratory: No cough, dyspnea Urologic: No hematuria, dysuria Abdominal:   No nausea, vomiting, diarrhea, bright red blood per rectum, melena, or hematemesis Neurologic:  No visual changes, wkns, changes in mental status. All other systems reviewed and are otherwise negative except as noted above.  Physical Exam    VS:  BP (!) 100/54 (BP Location: Left Arm, Patient Position: Sitting, Cuff Size: Large)   Pulse (!) 57   Ht '6\' 3"'$  (1.905 m)   Wt 249  lb 9.6 oz (113.2 kg)   SpO2 97%   BMI 31.20 kg/m  , BMI Body mass index is 31.2 kg/m.   General: Alert, oriented x3, no distress, mildly obese Head: no evidence of trauma, PERRL, EOMI, no exophtalmos or lid lag, no myxedema, no xanthelasma; normal ears, nose and oropharynx Neck: normal jugular venous pulsations and no hepatojugular reflux; brisk carotid pulses without delay and no carotid bruits Chest: clear to auscultation, no signs of consolidation by percussion or palpation, normal fremitus, symmetrical and full respiratory excursions Cardiovascular: normal position and quality of the apical impulse, irregular rhythm, normal first and second heart sounds, faint aortic ejection murmur, no diastolic murmurs, rubs or gallops Abdomen: no tenderness or distention, no masses by palpation, no abnormal pulsatility or arterial bruits, normal bowel sounds, no hepatosplenomegaly Extremities: no clubbing, cyanosis or edema; 2+ radial, ulnar and brachial pulses bilaterally; 2+ right femoral, posterior tibial and dorsalis pedis pulses; 2+ left femoral, posterior tibial and dorsalis pedis pulses; no subclavian or femoral bruits Neurological: grossly nonfocal Psych: Normal mood and affect    Accessory Clinical Findings    Recent Labs: 02/13/2022: ALT 19; BUN 38; Creatinine, Ser 1.28; Hemoglobin 14.3; Platelets 137.0; Potassium 4.6; Sodium 140   Recent Lipid Panel    Component Value Date/Time   CHOL 146 02/13/2022 0916   TRIG 77.0 02/13/2022 0916   HDL 38.70 (L) 02/13/2022 0916   CHOLHDL 4 02/13/2022 0916   VLDL 15.4 02/13/2022 0916   LDLCALC 92 02/13/2022 0916   LDLCALC 83 01/26/2020 0913   LDLDIRECT 143.5 01/22/2012 0916  02/13/2022: Cholesterol 146; HDL 38.70; LDL Cholesterol 92; Triglycerides 77.0; VLDL 15.4  ECG personally reviewed by me today-atrial flutter 57 bpm  Echocardiogram 07/01/2014  Study Conclusions   - Left ventricle: Systolic function was normal. The estimated     ejection fraction was in the range of 55% to 60%. Wall motion was    normal; there were no regional wall motion abnormalities.  - Aortic valve: There was trivial regurgitation.  - Mitral valve: There was mild regurgitation.  - Left atrium: No evidence of thrombus in the atrial  cavity or    appendage. The appendage was morphologically a left appendage,    multilobulated, and of normal size. Emptying velocity was mildly    reduced.  - Right atrium: No evidence of thrombus in the atrial cavity or    appendage.    Assessment & Plan   1. Typical atrial flutter ([place])   2. Acquired thrombophilia ([place])   3. OSA (obstructive sleep apnea)   4. Hypercholesterolemia   5. Essential hypertension       Atrial flutter: He has spontaneously slow ventricular rate at rest and good ventricular rate control with activity.  To date, he has not had severe symptomatic bradycardia.  He will almost certainly eventually need a pacemaker if he develops symptoms.  Asked him to call for episodes of worsening dizziness or syncope.[name] CHADSVasc 3 (age, HTN).  Appropriately anticoagulated Eliquis: Anticoagulation is well-tolerated without bleeding problems. OSA: Denies daytime hypersomnolence.  Compliant with CPAP. HLP: He does not have known CAD or PAD.  LDL in target range (92).  Borderline low HDL (39).  Advised continued exercise and weight loss. HTN: Borderline low blood pressure today but he is asymptomatic. Has lost weight.  He has been off amlodipine now for about 3 weeks. Will also decrease his dose of lisinopril.      Patient Instructions  Medication Instructions:  Decrease Lisinopril to '10mg'$  daily *If you need a refill on your cardiac medications before your next appointment, please call your pharmacy*  Follow-Up: At [facility], you and your health needs are our priority.  As part of our continuing mission to provide you with exceptional heart care, we have created designated Provider Care  Teams.  These Care Teams include your primary Cardiologist (physician) and Advanced Practice Providers (APPs -  Physician Assistants and Nurse Practitioners) who all work together to provide you with the care you need, when you need it.  We recommend signing up for the patient portal called "MyChart".  Sign up information is provided on this After Visit Summary.  MyChart is used to connect with patients for Virtual Visits (Telemedicine).  Patients are able to view lab/test results, encounter notes, upcoming appointments, etc.  Non-urgent messages can be sent to your provider as well.   To learn more about what you can do with MyChart, go to [email or web address].    Your next appointment:   1 year(s)  Provider:   [name], MD      [name], MD, [facility] [phone] office [phone] pager

## 2022-06-05 NOTE — Patient Instructions (Signed)
Medication Instructions:  Decrease Lisinopril to '10mg'$  daily *If you need a refill on your cardiac medications before your next appointment, please call your pharmacy*  Follow-Up: At [facility], you and your health needs are our priority.  As part of our continuing mission to provide you with exceptional heart care, we have created designated Provider Care Teams.  These Care Teams include your primary Cardiologist (physician) and Advanced Practice Providers (APPs -  Physician Assistants and Nurse Practitioners) who all work together to provide you with the care you need, when you need it.  We recommend signing up for the patient portal called "MyChart".  Sign up information is provided on this After Visit Summary.  MyChart is used to connect with patients for Virtual Visits (Telemedicine).  Patients are able to view lab/test results, encounter notes, upcoming appointments, etc.  Non-urgent messages can be sent to your provider as well.   To learn more about what you can do with MyChart, go to [email or web address].    Your next appointment:   1 year(s)  Provider:   [name], MD

## 2022-06-07 ENCOUNTER — Encounter: Payer: Self-pay | Admitting: Cardiovascular Disease

## 2022-06-15 DIAGNOSIS — H3582 Retinal ischemia: Secondary | ICD-10-CM | POA: Diagnosis not present

## 2022-06-15 DIAGNOSIS — H43813 Vitreous degeneration, bilateral: Secondary | ICD-10-CM | POA: Diagnosis not present

## 2022-06-15 DIAGNOSIS — H35361 Drusen (degenerative) of macula, right eye: Secondary | ICD-10-CM | POA: Diagnosis not present

## 2022-06-15 DIAGNOSIS — H35033 Hypertensive retinopathy, bilateral: Secondary | ICD-10-CM | POA: Diagnosis not present

## 2022-06-15 DIAGNOSIS — H34832 Tributary (branch) retinal vein occlusion, left eye, with macular edema: Secondary | ICD-10-CM | POA: Diagnosis not present

## 2022-06-27 DIAGNOSIS — H35352 Cystoid macular degeneration, left eye: Secondary | ICD-10-CM | POA: Diagnosis not present

## 2022-06-30 ENCOUNTER — Telehealth: Payer: Self-pay

## 2022-06-30 NOTE — Progress Notes (Signed)
Care Management & Coordination Services Pharmacy Team  Reason for Encounter: Appointment Reminder  Contacted patient to confirm telephone appointment with Burman Riis, PharmD on 07/03/2022 at 11:15. Spoke with patient on 06/30/2022   Do you have any problems getting your medications? Patient denies If yes what types of problems are you experiencing? Patient denies  What is your top health concern you would like to discuss at your upcoming visit?   Have you seen any other providers since your last visit with PCP? Patient denies any provider visits not noted in epic  Care Gaps: AWV - completed on 05/26/2022,  scheduled 05/30/2023 Last BP - 100/54 on 06/05/2022 Covid - overdue   Star Rating Drug: Lisinopril 10 mg - last filled on 06/05/2022 90DS at [facility] Simvastatin 20 mg - last filled on 04/17/2022 90DS at [place] Pharmacist Assistant [phone]

## 2022-07-02 NOTE — Progress Notes (Unsigned)
Care Management & Coordination Services Pharmacy Note  07/02/2022 Name:  Gary [name] MRN:  [id number] DOB:  06-Jun-1935  Summary: BP at goal <130/80, taking lisinopril '20mg'$  Denies any signs of bleed with Eliquis, or issues obtaining medication  Recommendations/Changes made from today's visit: -Continue medication therapy and regular BP/HR checks at home -Notify office of any signs of bleed  Follow up plan: Pharmacist visit in 6 months   Subjective: Gary [name] is an 87 y.o. year old male who is a primary patient of Burchette, Alinda Sierras, MD.  The care coordination team was consulted for assistance with disease management and care coordination needs.    Engaged with patient by telephone for follow up visit.  Recent office visits: 05/26/22 Laqueta Jean LPN, For AWV [id number] Carolann Littler, MD, For orthostatic dizziness, no medication changes 02/13/22 Carolann Littler, MD, Physical Exam, INCREASE lisinopril to '20mg'$   Recent consult visits: 06/05/22 [name], MD (Cardio) For atrial flutter. Amlodipine '5mg'$  discontinued (pt reported not taking) and DECREASE Lisinopril to '10mg'$  05/08/22 Troy Sine MD (Cardio), For OSA, No med changes 02/09/22 [name] (Ophthalmology), For retinal ischemia, no other details  Hospital visits: None in previous 6 months   Objective:  Lab Results  Component Value Date   CREATININE 1.28 02/13/2022   BUN 38 (H) 02/13/2022   GFR 51.61 (L) 02/13/2022   GFRNONAA 52 (L) 02/27/2021   GFRAA >60 08/16/2017   NA 140 02/13/2022   K 4.6 02/13/2022   CALCIUM 9.0 02/13/2022   CO2 25 02/13/2022   GLUCOSE 108 (H) 02/13/2022    Lab Results  Component Value Date/Time   HGBA1C 6.3 05/26/2022 09:43 AM   HGBA1C 6.4 02/13/2022 09:16 AM   GFR 51.61 (L) 02/13/2022 09:16 AM   GFR 53.47 (L) 02/14/2021 11:25 AM   MICROALBUR 49.7 (H) 05/26/2022 09:43 AM   MICROALBUR 121.5 (H) 02/13/2022 09:16 AM    Last diabetic Eye exam: No results found for:  "HMDIABEYEEXA"  Last diabetic Foot exam: No results found for: "HMDIABFOOTEX"   Lab Results  Component Value Date   CHOL 146 02/13/2022   HDL 38.70 (L) 02/13/2022   LDLCALC 92 02/13/2022   LDLDIRECT 143.5 01/22/2012   TRIG 77.0 02/13/2022   CHOLHDL 4 02/13/2022       Latest Ref Rng & Units 02/13/2022    9:16 AM 02/02/2021   10:07 AM 07/26/2020    9:48 AM  Hepatic Function  Total Protein 6.0 - 8.3 g/dL 6.7  6.2  6.7   Albumin 3.5 - 5.2 g/dL 4.0  3.9  4.2   AST 0 - 37 U/L '18  17  15   '$ ALT 0 - 53 U/L '19  17  18   '$ Alk Phosphatase 39 - 117 U/L 61  53  70   Total Bilirubin 0.2 - 1.2 mg/dL 1.0  0.8  1.0   Bilirubin, Direct 0.0 - 0.3 mg/dL 0.2       Lab Results  Component Value Date/Time   TSH 2.19 06/26/2017 10:23 AM   TSH 2.58 06/07/2016 08:20 AM       Latest Ref Rng & Units 02/13/2022    9:16 AM 02/27/2021    3:17 PM 02/02/2021   10:07 AM  CBC  WBC 4.0 - 10.5 K/uL 5.8  6.3  5.4   Hemoglobin 13.0 - 17.0 g/dL 14.3  15.1  14.4   Hematocrit 39.0 - 52.0 % 43.0  44.9  43.0   Platelets 150.0 - 400.0 K/uL 137.0  138  142.0     No results found for: "VD25OH", "VITAMINB12"  Clinical ASCVD: No The ASCVD Risk score (Arnett DK, et al., 2019) failed to calculate for the following reasons:   The 2019 ASCVD risk score is only valid for ages 24 to 27        02/13/2022    8:43 AM 08/22/2021    2:55 PM 05/20/2021    8:19 AM  Depression screen PHQ 2/9  Decreased Interest 0 0 0  Down, Depressed, Hopeless 0 0 0  PHQ - 2 Score 0 0 0  Altered sleeping  0   Tired, decreased energy  0   Change in appetite  0   Feeling bad or failure about yourself   0   Trouble concentrating  0   Moving slowly or fidgety/restless  0   Suicidal thoughts  0   PHQ-9 Score  0      Social History   Tobacco Use  Smoking Status Former  Smokeless Tobacco Never   BP Readings from Last 3 Encounters:  06/05/22 (!) 100/54  05/26/22 112/60  05/08/22 138/64   Pulse Readings from Last 3 Encounters:   06/05/22 (!) 57  05/26/22 64  05/08/22 (!) 58   Wt Readings from Last 3 Encounters:  06/05/22 249 lb 9.6 oz (113.2 kg)  05/26/22 246 lb (111.6 kg)  05/26/22 246 lb 4.8 oz (111.7 kg)   BMI Readings from Last 3 Encounters:  06/05/22 31.20 kg/m  05/26/22 30.75 kg/m  05/26/22 30.79 kg/m    No Known Allergies  Medications Reviewed Today     Reviewed by [name], MD (Physician) on 06/07/22 at [phone]  Med List Status: <None>   Medication Order Taking? Sig Documenting Provider Last Dose Status Informant  0.9 %  sodium chloride infusion [id number]   Ladene Artist, MD  Active   allopurinol (ZYLOPRIM) 100 MG tablet [id number] Yes TAKE 2 TABLETS EVERY DAY Burchette, Alinda Sierras, MD Taking Active   amLODipine (NORVASC) 5 MG tablet [id number] No Take 1 tablet (5 mg total) by mouth daily.  Patient not taking: Reported on 06/05/2022   Eulas Post, MD Not Taking Consider Medication Status and Discontinue (Discontinued by provider)   apixaban (ELIQUIS) 5 MG TABS tablet [id number] Yes Take 1 tablet (5 mg total) by mouth 2 (two) times daily. Croitoru, Mihai, MD Taking Active   co-enzyme Q-10 30 MG capsule [id number] Yes Take 30 mg by mouth every other day.  [provider] Taking Active Self  fluticasone (FLONASE) 50 MCG/ACT nasal spray [id number] Yes Place 1 spray into both nostrils every other day. Eulas Post, MD Taking Active   lisinopril (ZESTRIL) 10 MG tablet [id number]  Take 1 tablet (10 mg total) by mouth daily. Croitoru, Mihai, MD  Active   minocycline (MINOCIN,DYNACIN) 100 MG capsule [id number] Yes Take 100 mg by mouth every other day.  [provider] Taking Active   Multiple Vitamin (MULTIVITAMIN) tablet [id number] Yes Take 1 tablet by mouth daily. [provider] Taking Active Self  simvastatin (ZOCOR) 20 MG tablet [id number] Yes TAKE 1 TABLET EVERY OTHER DAY Koberlein, Junell C, MD Taking Active   tamsulosin (FLOMAX) 0.4 MG CAPS capsule [id number] Yes  Take 1 capsule (0.4 mg total) by mouth daily. Burchette, Alinda Sierras, MD Taking Active             SDOH:  (Social Determinants of Health) assessments and interventions performed: Yes SDOH Interventions    Flowsheet Row Clinical Support from  05/26/2022 in [place] at [facility] from 06/04/2020 in [place] at [facility] Management from 02/05/2020 in [place] at [facility] from 07/10/2018 in [place] at [facility] Interventions -- Intervention Not Indicated -- --  Housing Interventions -- Intervention Not Indicated -- --  Transportation Interventions -- Intervention Not Indicated Intervention Not Indicated --  Depression Interventions/Treatment  -- PHQ2-9 Score <4 Follow-up Not Indicated -- PHQ2-9 Score <4 Follow-up Not Indicated  Financial Strain Interventions -- Intervention Not Indicated Intervention Not Indicated --  Physical Activity Interventions Intervention Not Indicated Intervention Not Indicated -- --  Stress Interventions Intervention Not Indicated Intervention Not Indicated -- --  Social Connections Interventions -- Intervention Not Indicated -- --       Medication Assistance: None required.  Patient affirms current coverage meets needs.  Medication Access: Within the past 30 days, how often has patient missed a dose of medication? None Is a pillbox or other method used to improve adherence? Yes  Factors that may affect medication adherence? no barriers identified Are meds synced by current pharmacy? No  Are meds delivered by current pharmacy? Yes  Does patient experience delays in picking up medications due to transportation concerns? No   [facility] Reviewed: Is patient disadvantaged to use [facility]?: Yes  Current Rx insurance plan: [employer] Name and location of Current pharmacy:   [facility] - [place], [address] [address] Phone: [phone] Fax: [phone]  [facility] [id number] - [address], [place] AT [facility] OF [place] [address] Phone: [phone] Fax: [phone]  [facility] services reviewed with patient today?: No  Patient requests to transfer care to [facility]?: No  Reason patient declined to change pharmacies: Disadvantaged due to insurance/mail order  Compliance/Adherence/Medication fill history: Care Gaps: COVID [place]  Star-Rating Drugs: Lisinopril '10mg'$  PDC 100%   Assessment/Plan   Hypertension (BP goal <130/80) -Controlled -Current treatment: Lisinopril '20mg'$  1 qd Appropriate, Effective, Safe, Accessible -Medications previously tried: Amlodipine, HCTZ -Current home readings: 120s-130/65-72 -Current dietary habits: mindful of salt intake -Current exercise habits: Not discussed -Denies hypotensive/hypertensive symptoms -Educated on BP goals and benefits of medications for prevention of heart attack, stroke and kidney damage; Daily salt intake goal < 2300 mg; Exercise goal of 150 minutes per week; Importance of home blood pressure monitoring; Proper BP monitoring technique; Symptoms of hypotension and importance of maintaining adequate hydration; -Counseled to monitor BP at home every other day, document, and provide log at future appointments -Recommended to continue current medication  Atrial Fibrillation (Goal: prevent stroke and major bleeding) -Controlled -CHADSVASC: 4 -Current treatment: Rate control: None Anticoagulation: Eliquis '5mg'$  BID Appropriate, Effective, Safe, Accessible -Medications previously tried: None -Home BP and HR readings: see above, HR WNL  -Counseled on increased risk of stroke due to Afib and benefits of anticoagulation for stroke prevention; importance of  adherence to anticoagulant exactly as prescribed; bleeding risk associated with Eliquis and importance of self-monitoring for signs/symptoms of bleeding; avoidance of NSAIDs due to increased bleeding risk with anticoagulants; importance of regular laboratory monitoring; seeking medical attention after a head injury or if there is blood in the urine/stool; -Recommended to continue current medication  [place] Pharmacist [phone]

## 2022-07-03 ENCOUNTER — Ambulatory Visit: Payer: Medicare HMO

## 2022-08-04 ENCOUNTER — Other Ambulatory Visit: Payer: Self-pay | Admitting: Cardiovascular Disease

## 2022-08-04 DIAGNOSIS — I483 Typical atrial flutter: Secondary | ICD-10-CM

## 2022-08-04 NOTE — Telephone Encounter (Signed)
Prescription refill request for Eliquis received. Indication: Aflutter Last office visit: 06/05/22 (Croitoru)  Scr: 1.28 (02/13/22)  Age: 87 Weight: 113.2kg  Appropriate dose. Refill sent.

## 2022-08-21 DIAGNOSIS — L820 Inflamed seborrheic keratosis: Secondary | ICD-10-CM | POA: Diagnosis not present

## 2022-08-21 DIAGNOSIS — C44729 Squamous cell carcinoma of skin of left lower limb, including hip: Secondary | ICD-10-CM | POA: Diagnosis not present

## 2022-08-21 DIAGNOSIS — D485 Neoplasm of uncertain behavior of skin: Secondary | ICD-10-CM | POA: Diagnosis not present

## 2022-08-21 DIAGNOSIS — C44722 Squamous cell carcinoma of skin of right lower limb, including hip: Secondary | ICD-10-CM | POA: Diagnosis not present

## 2022-08-21 DIAGNOSIS — Z85828 Personal history of other malignant neoplasm of skin: Secondary | ICD-10-CM | POA: Diagnosis not present

## 2022-08-21 DIAGNOSIS — L570 Actinic keratosis: Secondary | ICD-10-CM | POA: Diagnosis not present

## 2022-08-25 ENCOUNTER — Ambulatory Visit: Payer: Medicare HMO | Admitting: Family Medicine

## 2022-09-07 ENCOUNTER — Emergency Department (HOSPITAL_COMMUNITY): Payer: Medicare HMO

## 2022-09-07 ENCOUNTER — Other Ambulatory Visit: Payer: Self-pay

## 2022-09-07 ENCOUNTER — Encounter (HOSPITAL_COMMUNITY): Payer: Self-pay

## 2022-09-07 ENCOUNTER — Inpatient Hospital Stay (HOSPITAL_COMMUNITY)
Admission: EM | Admit: 2022-09-07 | Discharge: 2022-09-11 | DRG: 287 | Disposition: A | Discharge status: Home / Self Care | Payer: Medicare HMO | Attending: Internal Medicine | Admitting: Internal Medicine

## 2022-09-07 DIAGNOSIS — I9589 Other hypotension: Secondary | ICD-10-CM | POA: Diagnosis present

## 2022-09-07 DIAGNOSIS — R0902 Hypoxemia: Secondary | ICD-10-CM | POA: Diagnosis not present

## 2022-09-07 DIAGNOSIS — I272 Pulmonary hypertension, unspecified: Secondary | ICD-10-CM | POA: Diagnosis present

## 2022-09-07 DIAGNOSIS — Z79899 Other long term (current) drug therapy: Secondary | ICD-10-CM

## 2022-09-07 DIAGNOSIS — G4733 Obstructive sleep apnea (adult) (pediatric): Secondary | ICD-10-CM | POA: Diagnosis present

## 2022-09-07 DIAGNOSIS — Z800 Family history of malignant neoplasm of digestive organs: Secondary | ICD-10-CM | POA: Diagnosis not present

## 2022-09-07 DIAGNOSIS — Z833 Family history of diabetes mellitus: Secondary | ICD-10-CM

## 2022-09-07 DIAGNOSIS — E7800 Pure hypercholesterolemia, unspecified: Secondary | ICD-10-CM | POA: Diagnosis present

## 2022-09-07 DIAGNOSIS — R55 Syncope and collapse: Secondary | ICD-10-CM | POA: Diagnosis present

## 2022-09-07 DIAGNOSIS — L719 Rosacea, unspecified: Secondary | ICD-10-CM | POA: Diagnosis present

## 2022-09-07 DIAGNOSIS — I3139 Other pericardial effusion (noninflammatory): Secondary | ICD-10-CM

## 2022-09-07 DIAGNOSIS — Z8249 Family history of ischemic heart disease and other diseases of the circulatory system: Secondary | ICD-10-CM | POA: Diagnosis not present

## 2022-09-07 DIAGNOSIS — I10 Essential (primary) hypertension: Secondary | ICD-10-CM | POA: Diagnosis present

## 2022-09-07 DIAGNOSIS — D7589 Other specified diseases of blood and blood-forming organs: Secondary | ICD-10-CM | POA: Diagnosis present

## 2022-09-07 DIAGNOSIS — Z7901 Long term (current) use of anticoagulants: Secondary | ICD-10-CM

## 2022-09-07 DIAGNOSIS — I483 Typical atrial flutter: Secondary | ICD-10-CM | POA: Diagnosis present

## 2022-09-07 DIAGNOSIS — I4892 Unspecified atrial flutter: Secondary | ICD-10-CM | POA: Diagnosis not present

## 2022-09-07 DIAGNOSIS — R0789 Other chest pain: Secondary | ICD-10-CM | POA: Diagnosis not present

## 2022-09-07 DIAGNOSIS — M109 Gout, unspecified: Secondary | ICD-10-CM | POA: Diagnosis present

## 2022-09-07 DIAGNOSIS — Z87891 Personal history of nicotine dependence: Secondary | ICD-10-CM | POA: Diagnosis not present

## 2022-09-07 DIAGNOSIS — Z85828 Personal history of other malignant neoplasm of skin: Secondary | ICD-10-CM

## 2022-09-07 DIAGNOSIS — D696 Thrombocytopenia, unspecified: Secondary | ICD-10-CM | POA: Diagnosis present

## 2022-09-07 DIAGNOSIS — R001 Bradycardia, unspecified: Secondary | ICD-10-CM | POA: Diagnosis present

## 2022-09-07 DIAGNOSIS — R404 Transient alteration of awareness: Secondary | ICD-10-CM | POA: Diagnosis not present

## 2022-09-07 DIAGNOSIS — J9811 Atelectasis: Secondary | ICD-10-CM | POA: Diagnosis not present

## 2022-09-07 DIAGNOSIS — R079 Chest pain, unspecified: Secondary | ICD-10-CM | POA: Diagnosis not present

## 2022-09-07 DIAGNOSIS — I472 Ventricular tachycardia, unspecified: Secondary | ICD-10-CM | POA: Diagnosis not present

## 2022-09-07 DIAGNOSIS — Z96651 Presence of right artificial knee joint: Secondary | ICD-10-CM | POA: Diagnosis present

## 2022-09-07 DIAGNOSIS — I4891 Unspecified atrial fibrillation: Secondary | ICD-10-CM | POA: Diagnosis present

## 2022-09-07 DIAGNOSIS — I700 Atherosclerosis of aorta: Secondary | ICD-10-CM | POA: Diagnosis not present

## 2022-09-07 LAB — CBC WITH DIFFERENTIAL/PLATELET
Abs Immature Granulocytes: 0.02 10*3/uL (ref 0.00–0.07)
Basophils Absolute: 0 10*3/uL (ref 0.0–0.1)
Basophils Relative: 0 %
Eosinophils Absolute: 0.2 10*3/uL (ref 0.0–0.5)
Eosinophils Relative: 3 %
HCT: 40.6 % (ref 39.0–52.0)
Hemoglobin: 13.3 g/dL (ref 13.0–17.0)
Immature Granulocytes: 0 %
Lymphocytes Relative: 22 %
Lymphs Abs: 1.6 10*3/uL (ref 0.7–4.0)
MCH: 33.7 pg (ref 26.0–34.0)
MCHC: 32.8 g/dL (ref 30.0–36.0)
MCV: 102.8 fL — ABNORMAL HIGH (ref 80.0–100.0)
Monocytes Absolute: 0.8 10*3/uL (ref 0.1–1.0)
Monocytes Relative: 11 %
Neutro Abs: 4.4 10*3/uL (ref 1.7–7.7)
Neutrophils Relative %: 64 %
Platelets: 140 10*3/uL — ABNORMAL LOW (ref 150–400)
RBC: 3.95 MIL/uL — ABNORMAL LOW (ref 4.22–5.81)
RDW: 13.3 % (ref 11.5–15.5)
WBC: 7 10*3/uL (ref 4.0–10.5)
nRBC: 0 % (ref 0.0–0.2)

## 2022-09-07 LAB — I-STAT BETA HCG BLOOD, ED (MC, WL, AP ONLY): I-stat hCG, quantitative: 8.6 m[IU]/mL — ABNORMAL HIGH (ref <5)

## 2022-09-07 NOTE — ED Triage Notes (Signed)
Pt sitting at dinner had a sudden syncopal episode. On EMS arrival pt unresponsive, HR 40, BP 70/40. EMS laid pt down and pt became responsive, with improved HR and BP. Pt received an 500 ml NS bolus. Pt did have some ETOH with dinner

## 2022-09-07 NOTE — ED Notes (Signed)
Pt does not recall tonight's event. Denies any symptoms at this time. States that this has never happened before

## 2022-09-08 ENCOUNTER — Encounter (HOSPITAL_COMMUNITY): Payer: Self-pay | Admitting: Internal Medicine

## 2022-09-08 ENCOUNTER — Inpatient Hospital Stay (HOSPITAL_BASED_OUTPATIENT_CLINIC_OR_DEPARTMENT_OTHER)
Admit: 2022-09-08 | Discharge: 2022-09-08 | Disposition: A | Discharge status: Home / Self Care | Payer: Medicare HMO | Attending: Physician Assistant | Admitting: Physician Assistant

## 2022-09-08 ENCOUNTER — Observation Stay (HOSPITAL_COMMUNITY): Payer: Medicare HMO

## 2022-09-08 ENCOUNTER — Other Ambulatory Visit: Payer: Self-pay | Admitting: Physician Assistant

## 2022-09-08 DIAGNOSIS — Z96651 Presence of right artificial knee joint: Secondary | ICD-10-CM | POA: Diagnosis present

## 2022-09-08 DIAGNOSIS — L719 Rosacea, unspecified: Secondary | ICD-10-CM | POA: Diagnosis present

## 2022-09-08 DIAGNOSIS — G4733 Obstructive sleep apnea (adult) (pediatric): Secondary | ICD-10-CM | POA: Diagnosis present

## 2022-09-08 DIAGNOSIS — R55 Syncope and collapse: Secondary | ICD-10-CM

## 2022-09-08 DIAGNOSIS — I9589 Other hypotension: Secondary | ICD-10-CM | POA: Diagnosis present

## 2022-09-08 DIAGNOSIS — D696 Thrombocytopenia, unspecified: Secondary | ICD-10-CM | POA: Diagnosis present

## 2022-09-08 DIAGNOSIS — Z833 Family history of diabetes mellitus: Secondary | ICD-10-CM | POA: Diagnosis not present

## 2022-09-08 DIAGNOSIS — I4892 Unspecified atrial flutter: Secondary | ICD-10-CM | POA: Diagnosis not present

## 2022-09-08 DIAGNOSIS — I272 Pulmonary hypertension, unspecified: Secondary | ICD-10-CM | POA: Diagnosis present

## 2022-09-08 DIAGNOSIS — Z87891 Personal history of nicotine dependence: Secondary | ICD-10-CM | POA: Diagnosis not present

## 2022-09-08 DIAGNOSIS — Z8249 Family history of ischemic heart disease and other diseases of the circulatory system: Secondary | ICD-10-CM | POA: Diagnosis not present

## 2022-09-08 DIAGNOSIS — I4891 Unspecified atrial fibrillation: Secondary | ICD-10-CM | POA: Diagnosis present

## 2022-09-08 DIAGNOSIS — M109 Gout, unspecified: Secondary | ICD-10-CM | POA: Diagnosis present

## 2022-09-08 DIAGNOSIS — Z79899 Other long term (current) drug therapy: Secondary | ICD-10-CM | POA: Diagnosis not present

## 2022-09-08 DIAGNOSIS — Z800 Family history of malignant neoplasm of digestive organs: Secondary | ICD-10-CM | POA: Diagnosis not present

## 2022-09-08 DIAGNOSIS — I10 Essential (primary) hypertension: Secondary | ICD-10-CM

## 2022-09-08 DIAGNOSIS — D7589 Other specified diseases of blood and blood-forming organs: Secondary | ICD-10-CM | POA: Insufficient documentation

## 2022-09-08 DIAGNOSIS — E7800 Pure hypercholesterolemia, unspecified: Secondary | ICD-10-CM | POA: Diagnosis present

## 2022-09-08 DIAGNOSIS — I3139 Other pericardial effusion (noninflammatory): Secondary | ICD-10-CM | POA: Diagnosis present

## 2022-09-08 DIAGNOSIS — Z7901 Long term (current) use of anticoagulants: Secondary | ICD-10-CM | POA: Diagnosis not present

## 2022-09-08 DIAGNOSIS — R001 Bradycardia, unspecified: Secondary | ICD-10-CM | POA: Diagnosis present

## 2022-09-08 DIAGNOSIS — I472 Ventricular tachycardia, unspecified: Secondary | ICD-10-CM | POA: Diagnosis not present

## 2022-09-08 DIAGNOSIS — I483 Typical atrial flutter: Secondary | ICD-10-CM | POA: Diagnosis not present

## 2022-09-08 DIAGNOSIS — Z85828 Personal history of other malignant neoplasm of skin: Secondary | ICD-10-CM | POA: Diagnosis not present

## 2022-09-08 LAB — BASIC METABOLIC PANEL
Anion gap: 9 (ref 5–15)
Anion gap: 12 (ref 5–15)
BUN: 45 mg/dL — ABNORMAL HIGH (ref 8–23)
BUN: 46 mg/dL — ABNORMAL HIGH (ref 8–23)
CO2: 21 mmol/L — ABNORMAL LOW (ref 22–32)
CO2: 19 mmol/L — ABNORMAL LOW (ref 22–32)
Calcium: 8.2 mg/dL — ABNORMAL LOW (ref 8.9–10.3)
Calcium: 8.2 mg/dL — ABNORMAL LOW (ref 8.9–10.3)
Chloride: 107 mmol/L (ref 98–111)
Chloride: 107 mmol/L (ref 98–111)
Creatinine, Ser: 1.46 mg/dL — ABNORMAL HIGH (ref 0.61–1.24)
Creatinine, Ser: 1.4 mg/dL — ABNORMAL HIGH (ref 0.61–1.24)
GFR, Estimated: 47 mL/min — ABNORMAL LOW (ref >60)
GFR, Estimated: 50 mL/min — ABNORMAL LOW (ref >60)
Glucose, Bld: 100 mg/dL — ABNORMAL HIGH (ref 70–99)
Glucose, Bld: 109 mg/dL — ABNORMAL HIGH (ref 70–99)
Potassium: 4.7 mmol/L (ref 3.5–5.1)
Potassium: 4.8 mmol/L (ref 3.5–5.1)
Sodium: 137 mmol/L (ref 135–145)
Sodium: 138 mmol/L (ref 135–145)

## 2022-09-08 LAB — ECHOCARDIOGRAM COMPLETE
AR max vel: 3.42 cm2
AV Area VTI: 2.78 cm2
AV Area mean vel: 3.4 cm2
AV Mean grad: 2.7 mmHg
AV Peak grad: 4.6 mmHg
AV Vena cont: 0.4 cm
Ao pk vel: 1.07 m/s
Area-P 1/2: 4.06 cm2
Height: 75 in
S' Lateral: 3.6 cm
Weight: 3920 oz

## 2022-09-08 LAB — CBC WITH DIFFERENTIAL/PLATELET
Abs Immature Granulocytes: 0.03 10*3/uL (ref 0.00–0.07)
Basophils Absolute: 0 10*3/uL (ref 0.0–0.1)
Basophils Relative: 1 %
Eosinophils Absolute: 0.1 10*3/uL (ref 0.0–0.5)
Eosinophils Relative: 2 %
HCT: 36.3 % — ABNORMAL LOW (ref 39.0–52.0)
Hemoglobin: 12.5 g/dL — ABNORMAL LOW (ref 13.0–17.0)
Immature Granulocytes: 1 %
Lymphocytes Relative: 23 %
Lymphs Abs: 1.4 10*3/uL (ref 0.7–4.0)
MCH: 34 pg (ref 26.0–34.0)
MCHC: 34.4 g/dL (ref 30.0–36.0)
MCV: 98.6 fL (ref 80.0–100.0)
Monocytes Absolute: 0.6 10*3/uL (ref 0.1–1.0)
Monocytes Relative: 10 %
Neutro Abs: 3.8 10*3/uL (ref 1.7–7.7)
Neutrophils Relative %: 63 %
Platelets: 133 10*3/uL — ABNORMAL LOW (ref 150–400)
RBC: 3.68 MIL/uL — ABNORMAL LOW (ref 4.22–5.81)
RDW: 13.3 % (ref 11.5–15.5)
WBC: 6.1 10*3/uL (ref 4.0–10.5)
nRBC: 0 % (ref 0.0–0.2)

## 2022-09-08 LAB — TROPONIN I (HIGH SENSITIVITY)
Troponin I (High Sensitivity): 16 ng/L (ref <18)
Troponin I (High Sensitivity): 16 ng/L (ref <18)

## 2022-09-08 LAB — URINALYSIS, ROUTINE W REFLEX MICROSCOPIC
Bacteria, UA: NONE SEEN
Bilirubin Urine: NEGATIVE
Glucose, UA: NEGATIVE mg/dL
Hgb urine dipstick: NEGATIVE
Ketones, ur: NEGATIVE mg/dL
Leukocytes,Ua: NEGATIVE
Nitrite: NEGATIVE
Protein, ur: 30 mg/dL — AB
Specific Gravity, Urine: 1.005 (ref 1.005–1.030)
pH: 5 (ref 5.0–8.0)

## 2022-09-08 LAB — VITAMIN B12: Vitamin B-12: 385 pg/mL (ref 180–914)

## 2022-09-08 LAB — TSH: TSH: 3.626 u[IU]/mL (ref 0.350–4.500)

## 2022-09-08 MED ORDER — LISINOPRIL 10 MG PO TABS: 10.0000 mg | ORAL_TABLET | Freq: Every day | ORAL | Status: DC

## 2022-09-08 MED ORDER — MINOCYCLINE HCL 50 MG PO CAPS
100.0000 mg | ORAL_CAPSULE | ORAL | Status: DC
  Administered 2022-09-08 – 2022-09-11 (×2): 100 mg via ORAL
  Filled 2022-09-08: qty 1
  Filled 2022-09-08 (×2): qty 2

## 2022-09-08 MED ORDER — ALLOPURINOL 100 MG PO TABS
200.0000 mg | ORAL_TABLET | Freq: Every day | ORAL | Status: DC
  Administered 2022-09-08 – 2022-09-11 (×4): 200 mg via ORAL
  Filled 2022-09-08 (×4): qty 2

## 2022-09-08 MED ORDER — SIMVASTATIN 20 MG PO TABS
20.0000 mg | ORAL_TABLET | ORAL | Status: DC
  Administered 2022-09-08: 20 mg via ORAL
  Filled 2022-09-08 (×4): qty 1

## 2022-09-08 MED ORDER — HYDRALAZINE HCL 20 MG/ML IJ SOLN: 5.0000 mg | INTRAMUSCULAR | Status: DC | PRN

## 2022-09-08 MED ORDER — APIXABAN 5 MG PO TABS
5.0000 mg | ORAL_TABLET | Freq: Two times a day (BID) | ORAL | Status: DC
  Administered 2022-09-08 – 2022-09-09 (×3): 5 mg via ORAL
  Filled 2022-09-08 (×3): qty 1

## 2022-09-08 MED ORDER — PERFLUTREN LIPID MICROSPHERE
1.0000 mL | INTRAVENOUS | Status: AC | PRN
  Administered 2022-09-08: 2 mL via INTRAVENOUS

## 2022-09-08 MED ORDER — TAMSULOSIN HCL 0.4 MG PO CAPS
0.4000 mg | ORAL_CAPSULE | Freq: Every day | ORAL | Status: DC
  Administered 2022-09-08 – 2022-09-11 (×4): 0.4 mg via ORAL
  Filled 2022-09-08 (×4): qty 1

## 2022-09-08 NOTE — Consult Note (Addendum)
Cardiology Consultation   Patient ID: ROE OKERSON MRN: [id number]; DOB: 08-30-1935  Admit date: 09/07/2022 Date of Consult: 09/08/2022  PCP:  Gary Covey, MD   [facility] Providers Cardiologist:  Gary Fair, MD       Patient Profile:   Gary [name] is a 87 y.o. male with a hx of longstanding atrial flutter with spontaneous slow ventricular rate, hypertension, hyperlipidemia, OSA and history of heart murmur who is being seen 09/08/2022 for the evaluation of syncope at the request of Dr. Frederick [name].  History of Present Illness:   Gary [name] is a 87 year old male with past medical history of longstanding atrial flutter with spontaneous slow ventricular rate, hypertension, hyperlipidemia, OSA and history of heart murmur.  TEE obtained in March 2016 showed EF 55 to 60%, moderate MR.  He had a cardioversion on the same day.  Carotid Doppler in March 2018 showed mild bilateral carotid artery disease.  He was last seen by Gary [name] in February 2024 at which time she was doing well.  His blood pressure was borderline low that day at 100/54, lisinopril was reduced to 10 mg daily.  It was mentioned that his heart rate has good response to activity.  Gary [name] suspected patient will eventually require pacemaker, however with the lack of documented symptomatic bradycardia, there was no indication for pacemaker at this time.   According to patient, he has been in his usual state of health recently.  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  He was able to walk on a treadmill for 12 minutes yesterday without any exertional chest pain or shortness of breath.  Although he had occasional orthostatic lightheadedness last year, symptom resolved after blood pressure medication was reduced.  He usually can just sit up and go in the morning.  He had finished eating supper last night with friends when he spontaneously lost consciousness while sitting at the  dinner table.  He did not have any prodromal symptom such as chest pain, shortness of breath or dizziness.  Total duration is roughly 20 minutes according to his guess.  Patient remembers sitting at the dinner table and the next moment he was in the EMS truck.  On arrival at his residence by EMS around 10:10 PM, blood pressure was 70/40 on manual check.  Heart rate 40.  Respiratory rate 16.  O2 saturation 91% on room air.  GSC 3.  Repeat vital sign at 10:16 PM showed 109/58, heart rate 57.  GSC 15 by that point.  On arrival at the [facility], ED.  He was in rate controlled atrial flutter.  Blood pressure 101/63.  O2 saturation 97%.  Heart rate 86.  While on telemetry, patient briefly converted back to sinus rhythm around 2 AM this morning but quickly returned back to rate controlled atrial flutter.  Overnight, there was no significant pauses or bradycardia on telemetry.  Home lisinopril has been held.  He was given 500 cc of IV fluid.  His systolic blood pressure improved to 150s this afternoon.  Cardiology service consulted for syncope.  At this time, patient is back to his baseline without any discomfort.   Past Medical History:  Diagnosis Date   Adenomatous colon polyp 06/2000   ALLERGIC RHINITIS 10/03/2007   Allergy    seasonal   Arthritis    knees   Atrial flutter (HCC) 06/26/2014   Cancer (HCC)    skin cancers removed in the past  basal cell and squamous cell   CARDIAC MURMUR, AORTIC 10/03/2007   ERECTILE DYSFUNCTION 10/03/2007   High blood pressure 03/2016   HYPERLIPIDEMIA 10/03/2007   Rosacea    Sleep apnea     Past Surgical History:  Procedure Laterality Date   ADENOIDECTOMY     CARDIOVERSION N/A 07/01/2014   Procedure: CARDIOVERSION;  Surgeon: Gary Fair, MD;  Location: [facility] ENDOSCOPY;  Service: Cardiovascular;  Laterality: N/A;   COLONOSCOPY     PALATE SURGERY     TEE WITHOUT CARDIOVERSION N/A 07/01/2014   Procedure: TRANSESOPHAGEAL ECHOCARDIOGRAM (TEE);  Surgeon: Gary Fair, MD;   Location: [facility] ENDOSCOPY;  Service: Cardiovascular;  Laterality: N/A;   TONSILLECTOMY     TOTAL KNEE ARTHROPLASTY Right 08/27/2017   Procedure: RIGHT TOTAL KNEE ARTHROPLASTY;  Surgeon: Gary Huh, MD;  Location: [facility] OR;  Service: Orthopedics;  Laterality: Right;     Home Medications:  Prior to Admission medications   Medication Sig Start Date End Date Taking? Authorizing Provider  allopurinol (ZYLOPRIM) 100 MG tablet TAKE 2 TABLETS EVERY DAY 02/06/22  Yes Burchette, Elberta Fortis, MD  co-enzyme Q-10 30 MG capsule Take 30 mg by mouth every Monday, Wednesday, and Friday.   Yes [provider]  ELIQUIS 5 MG TABS tablet TAKE 1 TABLET TWICE DAILY 08/04/22  Yes Croitoru, Mihai, MD  fluticasone (FLONASE) 50 MCG/ACT nasal spray Place 1 spray into both nostrils every other day. Patient taking differently: Place 1 spray into both nostrils every Monday, Wednesday, and Friday. Nightly 02/07/22  Yes Burchette, Elberta Fortis, MD  lisinopril (ZESTRIL) 10 MG tablet Take 1 tablet (10 mg total) by mouth daily. 06/05/22  Yes Croitoru, Mihai, MD  minocycline (MINOCIN,DYNACIN) 100 MG capsule Take 100 mg by mouth every Monday, Wednesday, and Friday.   Yes [provider]  Multiple Vitamin (MULTIVITAMIN) tablet Take 1 tablet by mouth daily.   Yes [provider]  simvastatin (ZOCOR) 20 MG tablet TAKE 1 TABLET EVERY OTHER DAY Patient taking differently: Take 20 mg by mouth every Monday, Wednesday, and Friday. 09/07/21  Yes Koberlein, Paris Lore, MD  tamsulosin (FLOMAX) 0.4 MG CAPS capsule Take 1 capsule (0.4 mg total) by mouth daily. 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    Comment: retired  Tobacco Use   Smoking status: Former   Smokeless tobacco: Never  Building services engineer Use: Never used  Substance and Sexual Activity   Alcohol use: Yes    Alcohol/week: 8.0 standard drinks of alcohol    Types: 8 Glasses of wine per week    Comment: ocassionally    Drug use: No   Sexual activity: Not Currently  Other Topics Concern   Not on file  Social History Narrative   Lives alone;wife deceased in the last year;two story house, but occupies main  level mostly   Has 4 children, two biological, two stepchildren, 9 grandkids   One son local, supportive    Active playing golf with friends, goes to Systems analyst 2 days/week at [employer]   Enjoys reading, soduku   Social Determinants of Health   Financial Resource Strain: Low Risk  (07/03/2022)   Overall Financial Resource Strain (CARDIA)    Difficulty of Paying Living Expenses: Not very hard  Food Insecurity: Unknown (07/03/2022)   Hunger Vital Sign    Worried About Running Out of Food in the Last Year: Never true    Ran Out of Food in the Last Year: Not on file  Transportation Needs: No Transportation Needs (07/03/2022)   PRAPARE - Administrator, Civil Service (Medical): No    Lack of Transportation (Non-Medical): No  Physical Activity: Insufficiently Active (05/26/2022)   Exercise Vital Sign    Days of Exercise per Week: 2 days    Minutes of Exercise per Session: 30 min  Stress: No Stress Concern Present (05/26/2022)   [employer] of Occupational Health - Occupational Stress Questionnaire    Feeling of Stress : Not at all  Social Connections: Socially Integrated (05/20/2021)   Social Connection and Isolation Panel [NHANES]    Frequency of Communication with  Friends and Family: More than three times a week    Frequency of Social Gatherings with Friends and Family: More than three times a week    Attends Religious Services: More than 4 times per year    Active Member of [employer] or Organizations: Yes    Attends Engineer, structural: More than 4 times per year    Marital Status: Married  Catering manager Violence: Not At Risk (05/20/2021)   Humiliation, Afraid, Rape, and Kick questionnaire    Fear of Current or Ex-Partner: No    Emotionally Abused: No    Physically Abused: No    Sexually Abused: No    Family History:    Family History  Problem Relation Age of Onset   Diabetes Mother    Cancer Father        liver ca   Liver cancer Father    Hypotension Sister    CAD Brother    Stomach cancer Paternal Aunt      ROS:  Please see the history of present illness.   All other ROS reviewed and negative.     Physical Exam/Data:   Vitals:   09/08/22 0312 09/08/22 0330 09/08/22 0430 09/08/22 0511  BP:  115/65 124/69 (!) 153/85  Pulse:  64 69   Resp:  16 20 19   Temp: 97.6 F (36.4 C)   97.7 F (36.5 C)  TempSrc: Oral     SpO2:  99% 100%   Weight:      Height:        Intake/Output Summary (Last 24 hours) at 09/08/2022 1359 Last data filed at 09/07/2022 2304 Gross per 24 hour  Intake 500 ml  Output --  Net 500 ml      09/07/2022   11:07 PM 06/05/2022   11:48 AM 05/26/2022   11:07 AM  Last 3 Weights  Weight (lbs) 245 lb 249 lb 9.6 oz 246 lb  Weight (kg) 111.131 kg 113.218 kg 111.585 kg     Body mass index is 30.62 kg/m.  General:  Well nourished, well developed, in no acute distress HEENT: normal Neck: no JVD Vascular: No carotid bruits; Distal pulses 2+ bilaterally Cardiac:  normal S1, S2;  RRR; no murmur  Lungs:  clear to auscultation bilaterally, no wheezing, rhonchi or rales  Abd: soft, nontender, no hepatomegaly  Ext: no edema Musculoskeletal:  No deformities, BUE and BLE strength normal and equal Skin: warm and  dry  Neuro:  CNs 2-12 intact, no focal abnormalities noted Psych:  Normal affect   EKG:  The EKG was personally reviewed and demonstrates: Normal sinus rhythm Telemetry:  Telemetry was personally reviewed and demonstrates: Atrial flutter, previous conversion to sinus rhythm around 2 to 3 AM this morning before converting back to atrial flutter.  No prolonged pauses or significant bradycardia  Relevant CV Studies:  TEE 07/01/2014 LV EF: 55% -   60%  Study Conclusions   - Left ventricle: Systolic function was normal. The estimated   ejection fraction was in the range of 55% to 60%. Wall motion was   normal; there were no regional wall motion abnormalities.  - Aortic valve: There was trivial regurgitation.  - Mitral valve: There was mild regurgitation.  - Left atrium: No evidence of thrombus in the atrial cavity or   appendage. The appendage was morphologically a left appendage, multilobulated, and of normal size. Emptying velocity was mildly reduced.  - Right atrium: No evidence of thrombus in the atrial cavity or appendage.    Laboratory Data:  High Sensitivity Troponin:   Recent Labs  Lab 09/07/22 2304 09/08/22 0111  TROPONINIHS 16 16     Chemistry Recent Labs  Lab 09/07/22 2304 09/08/22 0547  NA 137 138  K 4.7 4.8  CL 107 107  CO2 21* 19*  GLUCOSE 100* 109*  BUN 45* 46*  CREATININE 1.46* 1.40*  CALCIUM 8.2* 8.2*  GFRNONAA 47* 50*  ANIONGAP 9 12    No results for input(s): "PROT", "ALBUMIN", "AST", "ALT", "ALKPHOS", "BILITOT" in the last 168 hours. Lipids No results for input(s): "CHOL", "TRIG", "HDL", "LABVLDL", "LDLCALC", "CHOLHDL" in the last 168 hours.  Hematology Recent Labs  Lab 09/07/22 2304 09/08/22 0547  WBC 7.0 6.1  RBC 3.95* 3.68*  HGB 13.3 12.5*  HCT 40.6 36.3*  MCV 102.8* 98.6  MCH 33.7 34.0  MCHC 32.8 34.4  RDW 13.3 13.3  PLT 140* 133*   Thyroid  Recent Labs  Lab 09/08/22 0547  TSH 3.626    BNPNo results for input(s): "BNP", "PROBNP" in  the last 168 hours.  DDimer No results for input(s): "DDIMER" in the last 168 hours.   Radiology/Studies:  CT Head Wo Contrast  Result Date: 09/07/2022 CLINICAL DATA:  Recent syncopal episode EXAM: CT HEAD WITHOUT CONTRAST TECHNIQUE: Contiguous axial images were obtained from the base of the skull through the vertex without intravenous contrast. RADIATION DOSE REDUCTION: This exam was performed according to the departmental dose-optimization program which includes automated exposure control, adjustment of the mA and/or kV according to patient size and/or use of iterative reconstruction technique. COMPARISON:  None Available. FINDINGS: Brain: No evidence of acute infarction, hemorrhage, hydrocephalus, extra-axial collection or mass lesion/mass effect. Mild atrophic changes are noted. Vascular: No hyperdense vessel or unexpected calcification. Skull: Normal. Negative for fracture or focal lesion. Sinuses/Orbits: Orbits and their contents are within normal limits. Paranasal sinuses demonstrate diffuse mucosal thickening within the left maxillary, ethmoid and frontal sinuses. Other: None. IMPRESSION: Atrophic changes without acute intracranial abnormality. Mucosal thickening within the paranasal sinuses. Electronically Signed   By: Alcide Clever M.D.   On: 09/07/2022 23:33   DG Chest Portable 1 View  Result Date: 09/07/2022 CLINICAL DATA:  Chest pain. EXAM: PORTABLE  CHEST 1 VIEW COMPARISON:  February 27, 2021 FINDINGS: There is mild to moderate severity enlargement of the cardiac silhouette. Moderate to marked severity calcification of the aortic arch is seen. Mild atelectasis is seen within the left lung base. There is no evidence of a pleural effusion or pneumothorax. The visualized skeletal structures are unremarkable. IMPRESSION: Stable cardiomegaly with mild left basilar atelectasis. Electronically Signed   By: Aram Candela M.D.   On: 09/07/2022 23:21     Assessment and Plan:   Syncope  -Occurred  around 10 PM last night while the patient was sitting at the dinner table.  There was no prodromal symptoms such as chest pain, shortness of breath or dizziness.  There was no reported seizure.  Initial manual blood pressure obtained by EMS was 70/40.  Heart rate 40.  O2 saturation 91%.  Repeat vital signs 6 minutes later showed blood pressure improved to 109/58, pulse 57.  -Overnight, other than brief conversion back to sinus rhythm and then back to rate controlled atrial flutter, patient did not have any significant slow ventricular rate or pauses.  Gary [name] did mention that patient has a history of spontaneous slow heart rate and likely will require pacemaker in the future.  Unclear if his symptom was triggered by spontaneous lower heart rate.  Will discuss with MD to see if need EP evaluation versus heart monitor. Pending echo  Longstanding atrial flutter with history of spontaneous slow heart rate: self rate controlled. On Eliquis  Hypertension: hypotension around 70/40 immediately after syncope. Home lisinopril stopped, now SBP 150s  Hyperlipidemia  History of heart murmur: Mild MR on echocardiogram in 2016  OSA   Risk Assessment/Risk Scores:          CHA2DS2-VASc Score = 3   This indicates a 3.2% annual risk of stroke. The patient's score is based upon: CHF History: 0 HTN History: 1 Diabetes History: 0 Stroke History: 0 Vascular Disease History: 0 Age Score: 2 Gender Score: 0         For questions or updates, please contact [facility] Please consult [email or web address] for contact info under    Signed, Azalee Course, [place]  09/08/2022 1:59 PM  Patient seen and examined and agree with Azalee Course, PA as detailed above.  In brief, the patient is a  87 y.o. male with a hx of longstanding atrial flutter with spontaneous slow ventricular rate, hypertension, hyperlipidemia, and OSA who presented with syncope for which Cardiology was consulted.  Patient with known history  of persistent atrial flutter with episodes of slow ventricular response but was previously asymptomatic. Presented on this admission with sudden onset of LOC while sitting at a table after dinner. He denies any prodromal symptoms as detailed above. He is not on nodal agents and no recent medication changes, dehydration, illnesses, or symptoms to suggest vagal/orthostatic episode. Trop negative with no ischemic symptoms.Given known history of slow ventricular response as well as episodes of sinus during admission (conversion not caught on telemetry), this is concerning for cardiogenic related syncope (? Conversion pause). Discussed with Gary [name] and will plan for live monitor on discharge with close follow-up with him in clinic. Will follow-up TTE as well.  GEN: No acute distress.   Neck: No JVD Cardiac: Irregular, bradycardic, 2/6 systolic murmur Respiratory: Clear to auscultation bilaterally. GI: Soft, nontender, non-distended  MS: No edema; No deformity. Neuro:  Nonfocal  Psych: Normal affect    Plan: -Findings concerning for cardiogenic syncope in the setting of known conduction  disease -Plan for live 2 week monitor on discharge; may consider loop if monitor unrevealing -Follow-up TTE -If TTE reassuring, okay to discharge home from a CV standpoint  Laurance Flatten, MD

## 2022-09-08 NOTE — ED Notes (Signed)
ED TO INPATIENT HANDOFF REPORT  ED Nurse Name and Phone #: Alycia Rossetti [phone]   S Name/Age/Gender Gary [name] 87 y.o. male Room/Bed: 003C/003C  Code Status   Code Status: Prior  Home/SNF/Other Home Patient oriented to: self, place, time, situation  Is this baseline? Yes   Triage Complete: Triage complete  Chief Complaint Syncope [R55]  Triage Note Pt sitting at dinner had a sudden syncopal episode. On EMS arrival pt unresponsive, HR 40, BP 70/40. EMS laid pt down and pt became responsive, with improved HR and BP. Pt received an 500 ml NS bolus. Pt did have some ETOH with dinner   Allergies No Known Allergies  Level of Care/Admitting Diagnosis ED Disposition     ED Disposition  Admit   Condition  --   Comment  Hospital Area: [facility] [[id number]]  Level of Care: Telemetry Cardiac [[id number]]  May place patient in observation at [facility] or [facility] if equivalent level of care is available:: No  Covid Evaluation: Asymptomatic - no recent exposure (last 10 days) testing not required  Diagnosis: Syncope [206001]  Admitting Physician: Eduard Clos [[id number]]  Attending Physician: Eduard Clos [phone]          B Medical/Surgery History Past Medical History:  Diagnosis Date   Adenomatous colon polyp 06/2000   ALLERGIC RHINITIS 10/03/2007   Allergy    seasonal   Arthritis    knees   Atrial flutter (HCC) 06/26/2014   Cancer (HCC)    skin cancers removed in the past basal cell and squamous cell   CARDIAC MURMUR, AORTIC 10/03/2007   ERECTILE DYSFUNCTION 10/03/2007   High blood pressure 03/2016   HYPERLIPIDEMIA 10/03/2007   Rosacea    Sleep apnea    Past Surgical History:  Procedure Laterality Date   ADENOIDECTOMY     CARDIOVERSION N/A 07/01/2014   Procedure: CARDIOVERSION;  Surgeon: Thurmon Fair, MD;  Location: [facility] ENDOSCOPY;  Service: Cardiovascular;  Laterality: N/A;   COLONOSCOPY     PALATE SURGERY     TEE WITHOUT CARDIOVERSION N/A  07/01/2014   Procedure: TRANSESOPHAGEAL ECHOCARDIOGRAM (TEE);  Surgeon: Thurmon Fair, MD;  Location: [facility] ENDOSCOPY;  Service: Cardiovascular;  Laterality: N/A;   TONSILLECTOMY     TOTAL KNEE ARTHROPLASTY Right 08/27/2017   Procedure: RIGHT TOTAL KNEE ARTHROPLASTY;  Surgeon: Dannielle Huh, MD;  Location: [facility] OR;  Service: Orthopedics;  Laterality: Right;     A IV Location/Drains/Wounds Patient Lines/Drains/Airways Status     Active Line/Drains/Airways     Name Placement date Placement time Site Days   Peripheral IV 09/07/22 18 G Left Forearm 09/07/22  2259  Forearm  1   Peripheral IV 09/07/22 18 G Right Antecubital 09/07/22  2304  Antecubital  1   Incision (Closed) 08/27/17 Leg Right 08/27/17  0806  -- 1838            Intake/Output Last 24 hours  Intake/Output Summary (Last 24 hours) at 09/08/2022 0415 Last data filed at 09/07/2022 2304 Gross per 24 hour  Intake 500 ml  Output --  Net 500 ml    Labs/Imaging Results for orders placed or performed during the hospital encounter of 09/07/22 (from the past 48 hour(s))  Basic metabolic panel     Status: Abnormal   Collection Time: 09/07/22 11:04 PM  Result Value Ref Range   Sodium 137 135 - 145 mmol/L   Potassium 4.7 3.5 - 5.1 mmol/L   Chloride 107 98 - 111 mmol/L   CO2  21 (L) 22 - 32 mmol/L   Glucose, Bld 100 (H) 70 - 99 mg/dL    Comment: Glucose reference range applies only to samples taken after fasting for at least 8 hours.   BUN 45 (H) 8 - 23 mg/dL   Creatinine, Ser 4.78 (H) 0.61 - 1.24 mg/dL   Calcium 8.2 (L) 8.9 - 10.3 mg/dL   GFR, Estimated 47 (L) >60 mL/min    Comment: (NOTE) Calculated using the CKD-EPI Creatinine Equation (2021)    Anion gap 9 5 - 15    Comment: Performed at [facility], [address] [place], [place] [address]  CBC WITH DIFFERENTIAL     Status: Abnormal   Collection Time: 09/07/22 11:04 PM  Result Value Ref Range   WBC 7.0 4.0 - 10.5 K/uL   RBC 3.95 (L) 4.22 - 5.81 MIL/uL   Hemoglobin  13.3 13.0 - 17.0 g/dL   HCT 13.0 86.5 - 78.4 %   MCV 102.8 (H) 80.0 - 100.0 fL   MCH 33.7 26.0 - 34.0 pg   MCHC 32.8 30.0 - 36.0 g/dL   RDW 69.6 29.5 - 28.4 %   Platelets 140 (L) 150 - 400 K/uL   nRBC 0.0 0.0 - 0.2 %   Neutrophils Relative % 64 %   Neutro Abs 4.4 1.7 - 7.7 K/uL   Lymphocytes Relative 22 %   Lymphs Abs 1.6 0.7 - 4.0 K/uL   Monocytes Relative 11 %   Monocytes Absolute 0.8 0.1 - 1.0 K/uL   Eosinophils Relative 3 %   Eosinophils Absolute 0.2 0.0 - 0.5 K/uL   Basophils Relative 0 %   Basophils Absolute 0.0 0.0 - 0.1 K/uL   Immature Granulocytes 0 %   Abs Immature Granulocytes 0.02 0.00 - 0.07 K/uL    Comment: Performed at [facility], [address] [place], [place] [address]  Troponin I (High Sensitivity)     Status: None   Collection Time: 09/07/22 11:04 PM  Result Value Ref Range   Troponin I (High Sensitivity) 16 <18 ng/L    Comment: (NOTE) Elevated high sensitivity troponin I (hsTnI) values and significant  changes across serial measurements may suggest ACS but many other  chronic and acute conditions are known to elevate hsTnI results.  Refer to the "Links" section for chest pain algorithms and additional  guidance. Performed at [facility], [address], [place], [place] [address]   I-Stat beta hCG blood, ED ([facility], [facility], [facility] only)     Status: Abnormal   Collection Time: 09/07/22 11:19 PM  Result Value Ref Range   I-stat hCG, quantitative 8.6 (H) <5 mIU/mL   Comment 3            Comment:   GEST. AGE      CONC.  (mIU/mL)   <=1 WEEK        5 - 50     2 WEEKS       50 - 500     3 WEEKS       100 - 10,000     4 WEEKS     1,000 - 30,000        MALE AND NON-PREGNANT MALE:     LESS THAN 5 mIU/mL   Troponin I (High Sensitivity)     Status: None   Collection Time: 09/08/22  1:11 AM  Result Value Ref Range   Troponin I (High Sensitivity) 16 <18 ng/L    Comment: (NOTE)  Elevated high sensitivity troponin I (hsTnI) values and significant   changes across serial measurements may suggest ACS but many other  chronic and acute conditions are known to elevate hsTnI results.  Refer to the "Links" section for chest pain algorithms and additional  guidance. Performed at [facility], [address], [place], [place] [address]   Urinalysis, Routine w reflex microscopic -Urine, Clean Catch     Status: Abnormal   Collection Time: 09/08/22  2:03 AM  Result Value Ref Range   Color, Urine YELLOW YELLOW   APPearance CLEAR CLEAR   Specific Gravity, Urine 1.005 1.005 - 1.030   pH 5.0 5.0 - 8.0   Glucose, UA NEGATIVE NEGATIVE mg/dL   Hgb urine dipstick NEGATIVE NEGATIVE   Bilirubin Urine NEGATIVE NEGATIVE   Ketones, ur NEGATIVE NEGATIVE mg/dL   Protein, ur 30 (A) NEGATIVE mg/dL   Nitrite NEGATIVE NEGATIVE   Leukocytes,Ua NEGATIVE NEGATIVE   RBC / HPF 0-5 0 - 5 RBC/hpf   WBC, UA 0-5 0 - 5 WBC/hpf   Bacteria, UA NONE SEEN NONE SEEN   Squamous Epithelial / HPF 0-5 0 - 5 /HPF   Hyaline Casts, UA PRESENT     Comment: Performed at [facility], [address] [place], [place] [address]   CT Head Wo Contrast  Result Date: 09/07/2022 CLINICAL DATA:  Recent syncopal episode EXAM: CT HEAD WITHOUT CONTRAST TECHNIQUE: Contiguous axial images were obtained from the base of the skull through the vertex without intravenous contrast. RADIATION DOSE REDUCTION: This exam was performed according to the departmental dose-optimization program which includes automated exposure control, adjustment of the mA and/or kV according to patient size and/or use of iterative reconstruction technique. COMPARISON:  None Available. FINDINGS: Brain: No evidence of acute infarction, hemorrhage, hydrocephalus, extra-axial collection or mass lesion/mass effect. Mild atrophic changes are noted. Vascular: No hyperdense vessel or unexpected calcification. Skull: Normal. Negative for fracture or focal lesion. Sinuses/Orbits: Orbits and their contents are within normal  limits. Paranasal sinuses demonstrate diffuse mucosal thickening within the left maxillary, ethmoid and frontal sinuses. Other: None. IMPRESSION: Atrophic changes without acute intracranial abnormality. Mucosal thickening within the paranasal sinuses. Electronically Signed   By: Alcide Clever M.D.   On: 09/07/2022 23:33   DG Chest Portable 1 View  Result Date: 09/07/2022 CLINICAL DATA:  Chest pain. EXAM: PORTABLE CHEST 1 VIEW COMPARISON:  February 27, 2021 FINDINGS: There is mild to moderate severity enlargement of the cardiac silhouette. Moderate to marked severity calcification of the aortic arch is seen. Mild atelectasis is seen within the left lung base. There is no evidence of a pleural effusion or pneumothorax. The visualized skeletal structures are unremarkable. IMPRESSION: Stable cardiomegaly with mild left basilar atelectasis. Electronically Signed   By: Aram Candela M.D.   On: 09/07/2022 23:21    Pending Labs Unresulted Labs (From admission, onward)     Start     Ordered   09/07/22 2305  CBC with Differential  Once,   STAT        09/07/22 2307            Vitals/Pain Today's Vitals   09/07/22 2307 09/08/22 0100 09/08/22 0300 09/08/22 0312  BP:  103/61 126/68   Pulse:  66 62   Resp:   17   Temp:    97.6 F (36.4 C)  TempSrc:    Oral  SpO2:  97% 99%   Weight: 111.1 kg     Height: 6\' 3"  (1.905  m)     PainSc: 0-No pain       Isolation Precautions No active isolations  Medications Medications  apixaban (ELIQUIS) tablet 5 mg (5 mg Oral Given 09/08/22 0356)    Mobility walks     Focused Assessments Cardiac Assessment Handoff:  Cardiac Rhythm: Atrial fibrillation No results found for: "CKTOTAL", "CKMB", "CKMBINDEX", "TROPONINI" No results found for: "DDIMER" Does the Patient currently have chest pain? No    R Recommendations: See Admitting Provider Note  Report given to:   Additional Notes: Very pleasant gentleman, alert and oriented x4, ambulatory. Was  at a gathering having a couple cocktails when he had a syncopal event. No issues here, room air, sinus/occasionally A-fib on the monitor, slightly hypertensive. Here for observation, he doesn't really want to be here but he is pleasant about it.

## 2022-09-08 NOTE — Assessment & Plan Note (Signed)
-  Continue nightly CPAP °

## 2022-09-08 NOTE — Assessment & Plan Note (Signed)
Continue minocycline 

## 2022-09-08 NOTE — ED Provider Notes (Signed)
[place] [facility] DE[facility]MENT AT [facility] Provider Note   CSN: [id number] Arrival date & time: 09/07/22  2303     History  Chief Complaint  Patient presents with   Loss of Consciousness    Gary [name] is a 87 y.o. male.  The history is provided by the EMS personnel. The history is limited by the condition of the patient.  Loss of Consciousness Episode history:  Single Most recent episode:  Today Timing:  Constant Progression:  Unchanged Chronicity:  New Context: not standing up and not urination   Context comment:  Eating dinner Witnessed: yes   Relieved by:  Nothing Worsened by:  Nothing Ineffective treatments:  None tried Associated symptoms: no fever, no shortness of breath, no visual change and no vomiting   Risk factors: no congenital heart disease   Patient with hypertension and AFIB presents with syncope hypotension bradycardia     Past Medical History:  Diagnosis Date   Adenomatous colon polyp 06/2000   ALLERGIC RHINITIS 10/03/2007   Allergy    seasonal   Arthritis    knees   Atrial flutter (HCC) 06/26/2014   Cancer (HCC)    skin cancers removed in the past basal cell and squamous cell   CARDIAC MURMUR, AORTIC 10/03/2007   ERECTILE DYSFUNCTION 10/03/2007   High blood pressure 03/2016   HYPERLIPIDEMIA 10/03/2007   Rosacea    Sleep apnea      Home Medications Prior to Admission medications   Medication Sig Start Date End Date Taking? Authorizing Provider  allopurinol (ZYLOPRIM) 100 MG tablet TAKE 2 TABLETS EVERY DAY 02/06/22  Yes Burchette, Elberta Fortis, MD  co-enzyme Q-10 30 MG capsule Take 30 mg by mouth every other day.    Yes [provider]  ELIQUIS 5 MG TABS tablet TAKE 1 TABLET TWICE DAILY 08/04/22  Yes Croitoru, Mihai, MD  fluticasone (FLONASE) 50 MCG/ACT nasal spray Place 1 spray into both nostrils every other day. 02/07/22  Yes Burchette, Elberta Fortis, MD  lisinopril (ZESTRIL) 10 MG tablet Take 1 tablet (10 mg total) by mouth daily.  06/05/22  Yes Croitoru, Mihai, MD  minocycline (MINOCIN,DYNACIN) 100 MG capsule Take 100 mg by mouth every Monday, Wednesday, and Friday.   Yes [provider]  Multiple Vitamin (MULTIVITAMIN) tablet Take 1 tablet by mouth daily.   Yes [provider]  simvastatin (ZOCOR) 20 MG tablet TAKE 1 TABLET EVERY OTHER DAY Patient taking differently: Take 20 mg by mouth every Monday, Wednesday, and Friday. 09/07/21  Yes Koberlein, Paris Lore, MD  tamsulosin (FLOMAX) 0.4 MG CAPS capsule Take 1 capsule (0.4 mg total) by mouth daily. 10/20/21  Yes Burchette, Elberta Fortis, MD      Allergies    Patient has no known allergies.    Review of Systems   Review of Systems  Constitutional:  Negative for fever.  HENT:  Negative for facial swelling.   Eyes:  Negative for photophobia.  Respiratory:  Negative for shortness of breath.   Cardiovascular:  Positive for syncope.  Gastrointestinal:  Negative for vomiting.  All other systems reviewed and are negative.   Physical Exam Updated Vital Signs BP 103/61   Pulse 66   Temp 97.6 F (36.4 C) (Oral)   Resp 12   Ht 6\' 3"  (1.905 m)   Wt 111.1 kg   SpO2 97%   BMI 30.62 kg/m  Physical Exam Vitals and nursing note reviewed.  Constitutional:      General: He is not in  acute distress.    Appearance: Normal appearance. He is well-developed. He is not diaphoretic.  HENT:     Head: Normocephalic and atraumatic.     Nose: Nose normal.  Eyes:     Conjunctiva/sclera: Conjunctivae normal.     Pupils: Pupils are equal, round, and reactive to light.  Cardiovascular:     Rate and Rhythm: Normal rate. Rhythm irregular.     Pulses: Normal pulses.     Heart sounds: Normal heart sounds.  Pulmonary:     Effort: Pulmonary effort is normal.     Breath sounds: Normal breath sounds. No wheezing or rales.  Abdominal:     General: Bowel sounds are normal.     Palpations: Abdomen is soft.     Tenderness: There is no abdominal tenderness. There is no guarding  or rebound.  Musculoskeletal:        General: Normal range of motion.     Cervical back: Normal range of motion and neck supple.  Skin:    General: Skin is warm and dry.     Capillary Refill: Capillary refill takes less than 2 seconds.  Neurological:     General: No focal deficit present.     Mental Status: He is alert and oriented to person, place, and time.     Deep Tendon Reflexes: Reflexes normal.  Psychiatric:        Mood and Affect: Mood normal.        Behavior: Behavior normal.     ED Results / Procedures / Treatments   Labs (all labs ordered are listed, but only abnormal results are displayed) Results for orders placed or performed during the hospital encounter of 09/07/22  Basic metabolic panel  Result Value Ref Range   Sodium 137 135 - 145 mmol/L   Potassium 4.7 3.5 - 5.1 mmol/L   Chloride 107 98 - 111 mmol/L   CO2 21 (L) 22 - 32 mmol/L   Glucose, Bld 100 (H) 70 - 99 mg/dL   BUN 45 (H) 8 - 23 mg/dL   Creatinine, Ser 1.61 (H) 0.61 - 1.24 mg/dL   Calcium 8.2 (L) 8.9 - 10.3 mg/dL   GFR, Estimated 47 (L) >60 mL/min   Anion gap 9 5 - 15  CBC WITH DIFFERENTIAL  Result Value Ref Range   WBC 7.0 4.0 - 10.5 K/uL   RBC 3.95 (L) 4.22 - 5.81 MIL/uL   Hemoglobin 13.3 13.0 - 17.0 g/dL   HCT 09.6 04.5 - 40.9 %   MCV 102.8 (H) 80.0 - 100.0 fL   MCH 33.7 26.0 - 34.0 pg   MCHC 32.8 30.0 - 36.0 g/dL   RDW 81.1 91.4 - 78.2 %   Platelets 140 (L) 150 - 400 K/uL   nRBC 0.0 0.0 - 0.2 %   Neutrophils Relative % 64 %   Neutro Abs 4.4 1.7 - 7.7 K/uL   Lymphocytes Relative 22 %   Lymphs Abs 1.6 0.7 - 4.0 K/uL   Monocytes Relative 11 %   Monocytes Absolute 0.8 0.1 - 1.0 K/uL   Eosinophils Relative 3 %   Eosinophils Absolute 0.2 0.0 - 0.5 K/uL   Basophils Relative 0 %   Basophils Absolute 0.0 0.0 - 0.1 K/uL   Immature Granulocytes 0 %   Abs Immature Granulocytes 0.02 0.00 - 0.07 K/uL  I-Stat beta hCG blood, ED ([facility], [facility], [facility] only)  Result Value Ref Range   I-stat hCG, quantitative  8.6 (H) <5 mIU/mL   Comment 3  Troponin I (High Sensitivity)  Result Value Ref Range   Troponin I (High Sensitivity) 16 <18 ng/L   CT Head Wo Contrast  Result Date: 09/07/2022 CLINICAL DATA:  Recent syncopal episode EXAM: CT HEAD WITHOUT CONTRAST TECHNIQUE: Contiguous axial images were obtained from the base of the skull through the vertex without intravenous contrast. RADIATION DOSE REDUCTION: This exam was performed according to the departmental dose-optimization program which includes automated exposure control, adjustment of the mA and/or kV according to patient size and/or use of iterative reconstruction technique. COMPARISON:  None Available. FINDINGS: Brain: No evidence of acute infarction, hemorrhage, hydrocephalus, extra-axial collection or mass lesion/mass effect. Mild atrophic changes are noted. Vascular: No hyperdense vessel or unexpected calcification. Skull: Normal. Negative for fracture or focal lesion. Sinuses/Orbits: Orbits and their contents are within normal limits. Paranasal sinuses demonstrate diffuse mucosal thickening within the left maxillary, ethmoid and frontal sinuses. Other: None. IMPRESSION: Atrophic changes without acute intracranial abnormality. Mucosal thickening within the paranasal sinuses. Electronically Signed   By: Alcide Clever M.D.   On: 09/07/2022 23:33   DG Chest Portable 1 View  Result Date: 09/07/2022 CLINICAL DATA:  Chest pain. EXAM: PORTABLE CHEST 1 VIEW COMPARISON:  February 27, 2021 FINDINGS: There is mild to moderate severity enlargement of the cardiac silhouette. Moderate to marked severity calcification of the aortic arch is seen. Mild atelectasis is seen within the left lung base. There is no evidence of a pleural effusion or pneumothorax. The visualized skeletal structures are unremarkable. IMPRESSION: Stable cardiomegaly with mild left basilar atelectasis. Electronically Signed   By: Aram Candela M.D.   On: 09/07/2022 23:21    EKG EKG  Interpretation  Date/Time:  Thursday Sep 07 2022 40:98:11 EDT Ventricular Rate:  63 PR Interval:    QRS Duration: 128 QT Interval:  449 QTC Calculation: 449 R Axis:   36 Text Interpretation: Atrial fibrillation Nonspecific intraventricular conduction delay Artifact in lead(s) I II III aVR aVL aVF V1 V2 Confirmed by Nicanor Alcon, Addyson Traub ([id number]) on 09/08/2022 1:08:40 AM  Radiology CT Head Wo Contrast  Result Date: 09/07/2022 CLINICAL DATA:  Recent syncopal episode EXAM: CT HEAD WITHOUT CONTRAST TECHNIQUE: Contiguous axial images were obtained from the base of the skull through the vertex without intravenous contrast. RADIATION DOSE REDUCTION: This exam was performed according to the departmental dose-optimization program which includes automated exposure control, adjustment of the mA and/or kV according to patient size and/or use of iterative reconstruction technique. COMPARISON:  None Available. FINDINGS: Brain: No evidence of acute infarction, hemorrhage, hydrocephalus, extra-axial collection or mass lesion/mass effect. Mild atrophic changes are noted. Vascular: No hyperdense vessel or unexpected calcification. Skull: Normal. Negative for fracture or focal lesion. Sinuses/Orbits: Orbits and their contents are within normal limits. Paranasal sinuses demonstrate diffuse mucosal thickening within the left maxillary, ethmoid and frontal sinuses. Other: None. IMPRESSION: Atrophic changes without acute intracranial abnormality. Mucosal thickening within the paranasal sinuses. Electronically Signed   By: Alcide Clever M.D.   On: 09/07/2022 23:33   DG Chest Portable 1 View  Result Date: 09/07/2022 CLINICAL DATA:  Chest pain. EXAM: PORTABLE CHEST 1 VIEW COMPARISON:  February 27, 2021 FINDINGS: There is mild to moderate severity enlargement of the cardiac silhouette. Moderate to marked severity calcification of the aortic arch is seen. Mild atelectasis is seen within the left lung base. There is no evidence of a  pleural effusion or pneumothorax. The visualized skeletal structures are unremarkable. IMPRESSION: Stable cardiomegaly with mild left basilar atelectasis. Electronically Signed   By: Waylan Rocher  [place] M.D.   On: 09/07/2022 23:21    Procedures Procedures    Medications Ordered in ED Medications - No data to display  ED Course/ Medical Decision Making/ A&P                             Medical Decision Making Witnessed syncope with hypotension and bradycardia  Amount and/or Complexity of Data Reviewed Independent Historian: EMS    Details: See above  External Data Reviewed: notes.    Details: Previous notes reviewed  Labs: ordered.    Details: Normal sodium 137, normal potassium 4.7, creatinine slightly elevated 1.45, white count normal 7, hemoglobin normal 13.3, normal platelets. Troponin normal 16 Radiology: ordered and independent interpretation performed.    Details: Negative cxr by me no acute finding on head CT by me  ECG/medicine tests: ordered and independent interpretation performed. Decision-making details documented in ED Course.  Risk Decision regarding hospitalization.   Final Clinical Impression(s) / ED Diagnoses Final diagnoses:  Syncope and collapse   The patient appears reasonably stabilized for admission considering the current resources, flow, and capabilities available in the ED at this time, and I doubt any other [facility] requiring further screening and/or treatment in the ED prior to admission.  Rx / DC Orders ED Discharge Orders     None         Keithan Dileonardo, MD 09/08/22 1610

## 2022-09-08 NOTE — H&P (Addendum)
History and Physical    Gary [name] ZOX:[id number] DOB: [birth year] DOA: 09/07/2022  Patient coming from: Home.  Chief Complaint: Loss of consciousness.  HPI: Gary [name] is a 87 y.o. male with history of typical atrial flutter with slow response, hypertension, hyperlipidemia, sleep apnea, gout, rosacea was brought to the ER after patient had a brief episode of loss of consciousness.  Patient states he was having supper with his friends when he said he lost consciousness.  He did not have any prodromal symptoms.  Did not have any chest pain or shortness of breath.  He does not exactly remember the duration of his loss of consciousness but he thinks it was not long.  He did not have any focal deficits.  EMS on arrival found patient was bradycardic in the 40s and hypotensive with systolic in the 70s.  Was given 500 cc fluid bolus was brought to the ER.  ED Course: In the ER patient is found to be in atrial fibrillation rate around 60 bpm CT head unremarkable.  Labs are largely at baseline with CBC showing macrocytic which creatinine 1.4.  Troponins were negative.  Patient admitted for syncope.  Review of Systems: As per HPI, rest all negative.   Past Medical History:  Diagnosis Date   Adenomatous colon polyp 06/2000   ALLERGIC RHINITIS 10/03/2007   Allergy    seasonal   Arthritis    knees   Atrial flutter (HCC) 06/26/2014   Cancer (HCC)    skin cancers removed in the past basal cell and squamous cell   CARDIAC MURMUR, AORTIC 10/03/2007   ERECTILE DYSFUNCTION 10/03/2007   High blood pressure 03/2016   HYPERLIPIDEMIA 10/03/2007   Rosacea    Sleep apnea     Past Surgical History:  Procedure Laterality Date   ADENOIDECTOMY     CARDIOVERSION N/A 07/01/2014   Procedure: CARDIOVERSION;  Surgeon: Thurmon Fair, MD;  Location: [facility] ENDOSCOPY;  Service: Cardiovascular;  Laterality: N/A;   COLONOSCOPY     PALATE SURGERY     TEE WITHOUT CARDIOVERSION N/A 07/01/2014   Procedure: TRANSESOPHAGEAL  ECHOCARDIOGRAM (TEE);  Surgeon: Thurmon Fair, MD;  Location: [facility] ENDOSCOPY;  Service: Cardiovascular;  Laterality: N/A;   TONSILLECTOMY     TOTAL KNEE ARTHROPLASTY Right 08/27/2017   Procedure: RIGHT TOTAL KNEE ARTHROPLASTY;  Surgeon: Dannielle Huh, MD;  Location: [facility] OR;  Service: Orthopedics;  Laterality: Right;     reports that he has quit smoking. He has never used smokeless tobacco. He reports current alcohol use of about 8.0 standard drinks of alcohol per week. He reports that he does not use drugs.  No Known Allergies  Family History  Problem Relation Age of Onset   Diabetes Mother    Cancer Father        liver ca   Liver cancer Father    Hypotension Sister    CAD Brother    Stomach cancer Paternal Aunt     Prior to Admission medications   Medication Sig Start Date End Date Taking? Authorizing Provider  allopurinol (ZYLOPRIM) 100 MG tablet TAKE 2 TABLETS EVERY DAY 02/06/22  Yes Burchette, Elberta Fortis, MD  co-enzyme Q-10 30 MG capsule Take 30 mg by mouth every Monday, Wednesday, and Friday.   Yes [provider]  ELIQUIS 5 MG TABS tablet TAKE 1 TABLET TWICE DAILY 08/04/22  Yes Croitoru, Mihai, MD  fluticasone (FLONASE) 50 MCG/ACT nasal spray Place 1 spray into both nostrils every other day. Patient taking differently: Place 1 spray  into both nostrils every Monday, Wednesday, and Friday. Nightly 02/07/22  Yes Burchette, Elberta Fortis, MD  lisinopril (ZESTRIL) 10 MG tablet Take 1 tablet (10 mg total) by mouth daily. 06/05/22  Yes Croitoru, Mihai, MD  minocycline (MINOCIN,DYNACIN) 100 MG capsule Take 100 mg by mouth every Monday, Wednesday, and Friday.   Yes [provider]  Multiple Vitamin (MULTIVITAMIN) tablet Take 1 tablet by mouth daily.   Yes [provider]  simvastatin (ZOCOR) 20 MG tablet TAKE 1 TABLET EVERY OTHER DAY Patient taking differently: Take 20 mg by mouth every Monday, Wednesday, and Friday. 09/07/21  Yes Koberlein, Paris Lore, MD  tamsulosin (FLOMAX) 0.4  MG CAPS capsule Take 1 capsule (0.4 mg total) by mouth daily. 10/20/21  Yes Burchette, Elberta Fortis, MD    Physical Exam: Constitutional: Moderately built and nourished. Vitals:   09/08/22 0300 09/08/22 0312 09/08/22 0330 09/08/22 0430  BP: 126/68  115/65 124/69  Pulse: 62  64 69  Resp: 17  16 20   Temp:  97.6 F (36.4 C)    TempSrc:  Oral    SpO2: 99%  99% 100%  Weight:      Height:       Eyes: Anicteric no pallor. ENMT: No discharge from the ears eyes nose or mouth. Neck: No mass felt.  No neck rigidity. Respiratory: No rhonchi or crepitations. Cardiovascular: S1-S2 heard. Abdomen: Soft nontender bowel sounds present. Musculoskeletal: No edema. Skin: No rash. Neurologic: Alert awake oriented to time place and person.  Moves all extremities. Psychiatric: Appears normal.  Normal affect.   Labs on Admission: I have personally reviewed following labs and imaging studies  CBC: Recent Labs  Lab 09/07/22 2304  WBC 7.0  NEUTROABS 4.4  HGB 13.3  HCT 40.6  MCV 102.8*  PLT 140*   Basic Metabolic Panel: Recent Labs  Lab 09/07/22 2304  NA 137  K 4.7  CL 107  CO2 21*  GLUCOSE 100*  BUN 45*  CREATININE 1.46*  CALCIUM 8.2*   GFR: Estimated Creatinine Clearance: 50.7 mL/min (A) (by C-G formula based on SCr of 1.46 mg/dL (H)). Liver Function Tests: No results for input(s): "AST", "ALT", "ALKPHOS", "BILITOT", "PROT", "ALBUMIN" in the last 168 hours. No results for input(s): "LIPASE", "AMYLASE" in the last 168 hours. No results for input(s): "AMMONIA" in the last 168 hours. Coagulation Profile: No results for input(s): "INR", "PROTIME" in the last 168 hours. Cardiac Enzymes: No results for input(s): "CKTOTAL", "CKMB", "CKMBINDEX", "TROPONINI" in the last 168 hours. BNP (last 3 results) No results for input(s): "PROBNP" in the last 8760 hours. HbA1C: No results for input(s): "HGBA1C" in the last 72 hours. CBG: No results for input(s): "GLUCAP" in the last 168 hours. Lipid  Profile: No results for input(s): "CHOL", "HDL", "LDLCALC", "TRIG", "CHOLHDL", "LDLDIRECT" in the last 72 hours. Thyroid Function Tests: No results for input(s): "TSH", "T4TOTAL", "FREET4", "T3FREE", "THYROIDAB" in the last 72 hours. Anemia Panel: No results for input(s): "VITAMINB12", "FOLATE", "FERRITIN", "TIBC", "IRON", "RETICCTPCT" in the last 72 hours. Urine analysis:    Component Value Date/Time   COLORURINE YELLOW 09/08/2022 0203   APPEARANCEUR CLEAR 09/08/2022 0203   LABSPEC 1.005 09/08/2022 0203   PHURINE 5.0 09/08/2022 0203   GLUCOSEU NEGATIVE 09/08/2022 0203   HGBUR NEGATIVE 09/08/2022 0203   HGBUR negative 10/19/2009 0000   BILIRUBINUR NEGATIVE 09/08/2022 0203   BILIRUBINUR n 01/10/2019 0944   KETONESUR NEGATIVE 09/08/2022 0203   PROTEINUR 30 (A) 09/08/2022 0203   UROBILINOGEN 0.2 01/10/2019 0944   UROBILINOGEN  0.2 10/19/2009 0000   NITRITE NEGATIVE 09/08/2022 0203   LEUKOCYTESUR NEGATIVE 09/08/2022 0203   Sepsis Labs: @LABRCNTIP (procalcitonin:4,lacticidven:4) )No results found for this or any previous visit (from the past 240 hour(s)).   Radiological Exams on Admission: CT Head Wo Contrast  Result Date: 09/07/2022 CLINICAL DATA:  Recent syncopal episode EXAM: CT HEAD WITHOUT CONTRAST TECHNIQUE: Contiguous axial images were obtained from the base of the skull through the vertex without intravenous contrast. RADIATION DOSE REDUCTION: This exam was performed according to the departmental dose-optimization program which includes automated exposure control, adjustment of the mA and/or kV according to patient size and/or use of iterative reconstruction technique. COMPARISON:  None Available. FINDINGS: Brain: No evidence of acute infarction, hemorrhage, hydrocephalus, extra-axial collection or mass lesion/mass effect. Mild atrophic changes are noted. Vascular: No hyperdense vessel or unexpected calcification. Skull: Normal. Negative for fracture or focal lesion. Sinuses/Orbits:  Orbits and their contents are within normal limits. Paranasal sinuses demonstrate diffuse mucosal thickening within the left maxillary, ethmoid and frontal sinuses. Other: None. IMPRESSION: Atrophic changes without acute intracranial abnormality. Mucosal thickening within the paranasal sinuses. Electronically Signed   By: Alcide Clever M.D.   On: 09/07/2022 23:33   DG Chest Portable 1 View  Result Date: 09/07/2022 CLINICAL DATA:  Chest pain. EXAM: PORTABLE CHEST 1 VIEW COMPARISON:  February 27, 2021 FINDINGS: There is mild to moderate severity enlargement of the cardiac silhouette. Moderate to marked severity calcification of the aortic arch is seen. Mild atelectasis is seen within the left lung base. There is no evidence of a pleural effusion or pneumothorax. The visualized skeletal structures are unremarkable. IMPRESSION: Stable cardiomegaly with mild left basilar atelectasis. Electronically Signed   By: Aram Candela M.D.   On: 09/07/2022 23:21    EKG: Independently reviewed.  A-fib rate around 60 bpm.  Assessment/Plan Principal Problem:   Syncope Active Problems:   Hypercholesterolemia   Typical atrial flutter (HCC)   Obstructive sleep apnea   Essential hypertension   Rosacea   Gout   Macrocytosis   Thrombocytopenia (HCC)    Syncope -    patient was found to be bradycardic in the 40s with hypotension when EMS arrived.  Since then heart rate is improving patient is in A-fib.  Patient has known history of typical atrial flutter with slow response.  Recent cardiology notes indicate that if patient becomes symptomatic may need pacemaker.  Will need to consult cardiology in the morning.  Will continue to monitor telemetry. History of Typical atrial flutter with slow response not on any rate limiting medication continue Eliquis.  See #1.  Will check TSH. History of hypertension takes lisinopril since patient's blood pressure was low at presentation holding lisinopril for now.  Follow blood  pressure trends.  As needed IV hydralazine. History of gout on allopurinol. History of sleep apnea on CPAP at bedtime. History of rosacea on minocycline. Chronic thrombocytopenia. Possible chronic kidney disease stage III creatinine at times is elevated.  Will follow closely.   Macrocytosis we will check B12 and folate levels.   DVT prophylaxis: Eliquis. Code Status: Full code. Family Communication: Discussed with patient. Disposition Plan: Monitored bed.  Admission status: Observation.

## 2022-09-08 NOTE — Assessment & Plan Note (Signed)
-   B12 level 385

## 2022-09-08 NOTE — Assessment & Plan Note (Signed)
Continue allopurinol 

## 2022-09-08 NOTE — Hospital Course (Addendum)
Gary [name] is an 87 y.o. male with history of typical atrial flutter with slow response, hypertension, hyperlipidemia, sleep apnea, gout, rosacea was brought to the ER after patient had a brief episode of loss of consciousness.   Patient stated he was having supper with his friends when he lost consciousness.  It was the 1 year anniversary of his wife passing away 1 year ago.  He did endorse feeling upset to some degree but was not panicking or tearful prior to the event.   He did not have any prodromal symptoms.  Did not have any chest pain or shortness of breath.  He does not exactly remember the duration of his loss of consciousness but he thinks it was not long.  He did not have any focal deficits.  EMS on arrival found patient was bradycardic in the 40s and hypotensive with systolic in the 70s.  Was given 500 cc fluid bolus was brought to the ER.   In the ER patient is found to be in atrial fibrillation rate around 60 bpm. CT head unremarkable.  Labs are largely at baseline. Troponins were negative.  Patient admitted for syncope.

## 2022-09-08 NOTE — Assessment & Plan Note (Signed)
Continue simvastatin. 

## 2022-09-08 NOTE — Progress Notes (Signed)
Progress Note    ABEM STPAUL   ZOX:[id number]  DOB: 1935-05-07  DOA: 09/07/2022     0 PCP: Kristian Covey, MD  Initial CC: syncope  Hospital Course: Gary MALEN is an 87 y.o. male with history of typical atrial flutter with slow response, hypertension, hyperlipidemia, sleep apnea, gout, rosacea was brought to the ER after patient had a brief episode of loss of consciousness.   Patient stated he was having supper with his friends when he lost consciousness.  It was the 1 year anniversary of his wife passing away 1 year ago.  He did endorse feeling upset to some degree but was not panicking or tearful prior to the event.   He did not have any prodromal symptoms.  Did not have any chest pain or shortness of breath.  He does not exactly remember the duration of his loss of consciousness but he thinks it was not long.  He did not have any focal deficits.  EMS on arrival found patient was bradycardic in the 40s and hypotensive with systolic in the 70s.  Was given 500 cc fluid bolus was brought to the ER.   In the ER patient is found to be in atrial fibrillation rate around 60 bpm. CT head unremarkable.  Labs are largely at baseline. Troponins were negative.  Patient admitted for syncope.  Interval History:  Seen this morning resting in bed.  He felt normal and back to baseline.  Denied any chest pain, shortness of breath, dizziness, palpitations. Does not recall the events leading up to passing out nor does he remember how long episode lasted.  He mostly remembers being at the dinner table talking about his wife and then woke up in the ambulance next thing he knew.  Assessment and Plan: * Syncope - Patient was already sitting down eating when episode happened. He was upset thinking about his wife passing 1 year prior and a vasovagal event is possible but other considered differential includes symptomatic bradycardia given worsened bradycardia from baseline and hypotension with event -  has followed with cardiology prior for aflutter with slow VR and has been told may need PPM in the future  - continue on tele -TSH normal - follow up echo  - cardiology consulted   Macrocytosis - B12 level 385  Gout - Continue allopurinol  Rosacea Continue minocycline  Essential hypertension - Lisinopril on hold in setting of soft blood pressure on admission  Obstructive sleep apnea - Continue nightly CPAP  Typical atrial flutter (HCC) - Continue Eliquis  Hypercholesterolemia - Continue simvastatin   Old records reviewed in assessment of this patient  Antimicrobials:   DVT prophylaxis:   apixaban (ELIQUIS) tablet 5 mg   Code Status:   Code Status: Full Code  Mobility Assessment (last 72 hours)     Mobility Assessment     Row Name 09/08/22 1000 09/08/22 0618         Does patient have an order for bedrest or is patient medically unstable No - Continue assessment No - Continue assessment      What is the highest level of mobility based on the progressive mobility assessment? Level 5 (Walks with assist in room/hall) - Balance while stepping forward/back and can walk in room with assist - Complete Level 5 (Walks with assist in room/hall) - Balance while stepping forward/back and can walk in room with assist - Complete               Barriers to  discharge: none Disposition Plan:  Home Status is: Inpt  Objective: Blood pressure (!) 153/85, pulse 69, temperature 97.7 F (36.5 C), resp. rate 19, height 6\' 3"  (1.905 m), weight 111.1 kg, SpO2 100 %.  Examination:  Physical Exam Constitutional:      General: He is not in acute distress.    Appearance: Normal appearance.  HENT:     Head: Normocephalic and atraumatic.     Mouth/Throat:     Mouth: Mucous membranes are moist.  Eyes:     Extraocular Movements: Extraocular movements intact.  Cardiovascular:     Rate and Rhythm: Bradycardia present. Rhythm irregular.  Pulmonary:     Effort: Pulmonary effort is  normal. No respiratory distress.     Breath sounds: Normal breath sounds. No wheezing.  Abdominal:     General: Bowel sounds are normal. There is no distension.     Palpations: Abdomen is soft.     Tenderness: There is no abdominal tenderness.  Musculoskeletal:        General: Normal range of motion.     Cervical back: Normal range of motion and neck supple.  Skin:    General: Skin is warm and dry.  Neurological:     General: No focal deficit present.     Mental Status: He is alert.  Psychiatric:        Mood and Affect: Mood normal.        Behavior: Behavior normal.      Consultants:  Cardiology  Procedures:    Data Reviewed: Results for orders placed or performed during the hospital encounter of 09/07/22 (from the past 24 hour(s))  Basic metabolic panel     Status: Abnormal   Collection Time: 09/07/22 11:04 PM  Result Value Ref Range   Sodium 137 135 - 145 mmol/L   Potassium 4.7 3.5 - 5.1 mmol/L   Chloride 107 98 - 111 mmol/L   CO2 21 (L) 22 - 32 mmol/L   Glucose, Bld 100 (H) 70 - 99 mg/dL   BUN 45 (H) 8 - 23 mg/dL   Creatinine, Ser 1.61 (H) 0.61 - 1.24 mg/dL   Calcium 8.2 (L) 8.9 - 10.3 mg/dL   GFR, Estimated 47 (L) >60 mL/min   Anion gap 9 5 - 15  CBC WITH DIFFERENTIAL     Status: Abnormal   Collection Time: 09/07/22 11:04 PM  Result Value Ref Range   WBC 7.0 4.0 - 10.5 K/uL   RBC 3.95 (L) 4.22 - 5.81 MIL/uL   Hemoglobin 13.3 13.0 - 17.0 g/dL   HCT 09.6 04.5 - 40.9 %   MCV 102.8 (H) 80.0 - 100.0 fL   MCH 33.7 26.0 - 34.0 pg   MCHC 32.8 30.0 - 36.0 g/dL   RDW 81.1 91.4 - 78.2 %   Platelets 140 (L) 150 - 400 K/uL   nRBC 0.0 0.0 - 0.2 %   Neutrophils Relative % 64 %   Neutro Abs 4.4 1.7 - 7.7 K/uL   Lymphocytes Relative 22 %   Lymphs Abs 1.6 0.7 - 4.0 K/uL   Monocytes Relative 11 %   Monocytes Absolute 0.8 0.1 - 1.0 K/uL   Eosinophils Relative 3 %   Eosinophils Absolute 0.2 0.0 - 0.5 K/uL   Basophils Relative 0 %   Basophils Absolute 0.0 0.0 - 0.1 K/uL    Immature Granulocytes 0 %   Abs Immature Granulocytes 0.02 0.00 - 0.07 K/uL  Troponin I (High Sensitivity)     Status: None  Collection Time: 09/07/22 11:04 PM  Result Value Ref Range   Troponin I (High Sensitivity) 16 <18 ng/L  I-Stat beta hCG blood, ED ([facility], [facility], [facility] only)     Status: Abnormal   Collection Time: 09/07/22 11:19 PM  Result Value Ref Range   I-stat hCG, quantitative 8.6 (H) <5 mIU/mL   Comment 3          Troponin I (High Sensitivity)     Status: None   Collection Time: 09/08/22  1:11 AM  Result Value Ref Range   Troponin I (High Sensitivity) 16 <18 ng/L  Urinalysis, Routine w reflex microscopic -Urine, Clean Catch     Status: Abnormal   Collection Time: 09/08/22  2:03 AM  Result Value Ref Range   Color, Urine YELLOW YELLOW   APPearance CLEAR CLEAR   Specific Gravity, Urine 1.005 1.005 - 1.030   pH 5.0 5.0 - 8.0   Glucose, UA NEGATIVE NEGATIVE mg/dL   Hgb urine dipstick NEGATIVE NEGATIVE   Bilirubin Urine NEGATIVE NEGATIVE   Ketones, ur NEGATIVE NEGATIVE mg/dL   Protein, ur 30 (A) NEGATIVE mg/dL   Nitrite NEGATIVE NEGATIVE   Leukocytes,Ua NEGATIVE NEGATIVE   RBC / HPF 0-5 0 - 5 RBC/hpf   WBC, UA 0-5 0 - 5 WBC/hpf   Bacteria, UA NONE SEEN NONE SEEN   Squamous Epithelial / HPF 0-5 0 - 5 /HPF   Hyaline Casts, UA PRESENT   Basic metabolic panel     Status: Abnormal   Collection Time: 09/08/22  5:47 AM  Result Value Ref Range   Sodium 138 135 - 145 mmol/L   Potassium 4.8 3.5 - 5.1 mmol/L   Chloride 107 98 - 111 mmol/L   CO2 19 (L) 22 - 32 mmol/L   Glucose, Bld 109 (H) 70 - 99 mg/dL   BUN 46 (H) 8 - 23 mg/dL   Creatinine, Ser 1.61 (H) 0.61 - 1.24 mg/dL   Calcium 8.2 (L) 8.9 - 10.3 mg/dL   GFR, Estimated 50 (L) >60 mL/min   Anion gap 12 5 - 15  CBC with Differential/Platelet     Status: Abnormal   Collection Time: 09/08/22  5:47 AM  Result Value Ref Range   WBC 6.1 4.0 - 10.5 K/uL   RBC 3.68 (L) 4.22 - 5.81 MIL/uL   Hemoglobin 12.5 (L) 13.0 - 17.0 g/dL    HCT 09.6 (L) 04.5 - 52.0 %   MCV 98.6 80.0 - 100.0 fL   MCH 34.0 26.0 - 34.0 pg   MCHC 34.4 30.0 - 36.0 g/dL   RDW 40.9 81.1 - 91.4 %   Platelets 133 (L) 150 - 400 K/uL   nRBC 0.0 0.0 - 0.2 %   Neutrophils Relative % 63 %   Neutro Abs 3.8 1.7 - 7.7 K/uL   Lymphocytes Relative 23 %   Lymphs Abs 1.4 0.7 - 4.0 K/uL   Monocytes Relative 10 %   Monocytes Absolute 0.6 0.1 - 1.0 K/uL   Eosinophils Relative 2 %   Eosinophils Absolute 0.1 0.0 - 0.5 K/uL   Basophils Relative 1 %   Basophils Absolute 0.0 0.0 - 0.1 K/uL   Immature Granulocytes 1 %   Abs Immature Granulocytes 0.03 0.00 - 0.07 K/uL  Vitamin B12     Status: None   Collection Time: 09/08/22  5:47 AM  Result Value Ref Range   Vitamin B-12 385 180 - 914 pg/mL  TSH     Status: None   Collection Time: 09/08/22  5:47 AM  Result Value Ref Range   TSH 3.626 0.350 - 4.500 uIU/mL    I have reviewed pertinent nursing notes, vitals, labs, and images as necessary. I have ordered labwork to follow up on as indicated.  I have reviewed the last notes from staff over past 24 hours. I have discussed patient's care plan and test results with nursing staff, CM/SW, and other staff as appropriate.  Time spent: Greater than 50% of the 55 minute visit was spent in counseling/coordination of care for the patient as laid out in the A&P.   LOS: 0 days   Lewie Chamber, MD [facility] 09/08/2022, 2:09 PM

## 2022-09-08 NOTE — Assessment & Plan Note (Addendum)
-   Patient was already sitting down eating when episode happened. He was upset thinking about his wife passing 1 year prior and a vasovagal event is possible but other considered differential includes symptomatic bradycardia given worsened bradycardia from baseline and hypotension with event; large pericardial effusion may also be contributing and/or combo of above  - has followed with cardiology prior for aflutter with slow VR and has been told may need PPM in the future  -TSH normal - cardiology and EP following - continue tele/zio - multiple considered etiologies at this time, see specialists notes - s/p RHC on 5/13: normal LV systolic function and no compromise/tamponade from a pericardial effusion statndpoint - patient cleared for discharge home after discussion with cardiology

## 2022-09-08 NOTE — ED Notes (Signed)
[phone] Gary [name] (son)

## 2022-09-08 NOTE — Assessment & Plan Note (Signed)
-   Lisinopril resumed at discharge

## 2022-09-08 NOTE — Assessment & Plan Note (Signed)
-   eliquis resumed at discharge

## 2022-09-09 DIAGNOSIS — I3139 Other pericardial effusion (noninflammatory): Secondary | ICD-10-CM

## 2022-09-09 DIAGNOSIS — R55 Syncope and collapse: Secondary | ICD-10-CM | POA: Diagnosis not present

## 2022-09-09 DIAGNOSIS — I483 Typical atrial flutter: Secondary | ICD-10-CM

## 2022-09-09 MED ORDER — FUROSEMIDE 10 MG/ML IJ SOLN: 40.0000 mg | Freq: Two times a day (BID) | INTRAMUSCULAR | Status: DC

## 2022-09-09 MED ORDER — FUROSEMIDE 10 MG/ML IJ SOLN
40.0000 mg | Freq: Once | INTRAMUSCULAR | Status: AC
  Administered 2022-09-09: 40 mg via INTRAVENOUS
  Filled 2022-09-09: qty 4

## 2022-09-09 MED ORDER — HEPARIN (PORCINE) 25000 UT/250ML-% IV SOLN
1550.0000 [IU]/h | INTRAVENOUS | Status: AC
  Administered 2022-09-09: 1100 [IU]/h via INTRAVENOUS
  Administered 2022-09-10: 1400 [IU]/h via INTRAVENOUS
  Administered 2022-09-11: 1550 [IU]/h via INTRAVENOUS
  Filled 2022-09-09 (×3): qty 250

## 2022-09-09 NOTE — Progress Notes (Signed)
Rounding Note    Patient Name: Gary [name] Date of Encounter: 09/09/2022  [facility] Cardiologist: Thurmon Fair, MD   Subjective   Patient denies dizziness   No SOB  no CP     Recounts that he was sitting with friends   Next thing woke up to EMS   No dizzienss prior or after     Inpatient Medications    Scheduled Meds:  allopurinol  200 mg Oral Daily   apixaban  5 mg Oral BID   minocycline  100 mg Oral Q M,W,F   simvastatin  20 mg Oral Q M,W,F   tamsulosin  0.4 mg Oral Daily   Continuous Infusions:  PRN Meds: hydrALAZINE   Vital Signs    Vitals:   09/08/22 0911 09/08/22 1642 09/08/22 2059 09/09/22 0354  BP: (!) 148/79 (!) 163/80 (!) 160/79 (!) 145/79  Pulse:  83 (!) 55 (!) 54  Resp:  20    Temp:  98.5 F (36.9 C)  98.3 F (36.8 C)  TempSrc:  Oral  Oral  SpO2: 100%  98% 97%  Weight:      Height:       No intake or output data in the 24 hours ending 09/09/22 0709    09/07/2022   11:07 PM 06/05/2022   11:48 AM 05/26/2022   11:07 AM  Last 3 Weights  Weight (lbs) 245 lb 249 lb 9.6 oz 246 lb  Weight (kg) 111.131 kg 113.218 kg 111.585 kg      Telemetry    Atrial flutter   50s    - Personally Reviewed   Physical Exam   GEN: No acute distress.   Neck: JVP is increased    Cardiac: Irreg irreg  No S3    Respiratory: CTA  GI: Soft, nontender, non-distended  MS: Tr  edema   Labs    High Sensitivity Troponin:   Recent Labs  Lab 09/07/22 2304 09/08/22 0111  TROPONINIHS 16 16     Chemistry Recent Labs  Lab 09/07/22 2304 09/08/22 0547  NA 137 138  K 4.7 4.8  CL 107 107  CO2 21* 19*  GLUCOSE 100* 109*  BUN 45* 46*  CREATININE 1.46* 1.40*  CALCIUM 8.2* 8.2*  GFRNONAA 47* 50*  ANIONGAP 9 12    Lipids No results for input(s): "CHOL", "TRIG", "HDL", "LABVLDL", "LDLCALC", "CHOLHDL" in the last 168 hours.  Hematology Recent Labs  Lab 09/07/22 2304 09/08/22 0547  WBC 7.0 6.1  RBC 3.95* 3.68*  HGB 13.3 12.5*  HCT 40.6 36.3*   MCV 102.8* 98.6  MCH 33.7 34.0  MCHC 32.8 34.4  RDW 13.3 13.3  PLT 140* 133*   Thyroid  Recent Labs  Lab 09/08/22 0547  TSH 3.626    BNPNo results for input(s): "BNP", "PROBNP" in the last 168 hours.  DDimer No results for input(s): "DDIMER" in the last 168 hours.   Radiology    ECHOCARDIOGRAM COMPLETE  Result Date: 09/08/2022    ECHOCARDIOGRAM REPORT   Patient Name:   Gary [name] Date of Exam: 09/08/2022 Medical Rec #:  [id number]       Height:       75.0 in Accession #:    [id number]      Weight:       245.0 lb Date of Birth:  [birth year]      BSA:          2.392 m Patient Age:    87 years  BP:           148/79 mmHg Patient Gender: M               HR:           58 bpm. Exam Location:  Inpatient Procedure: 2D Echo, Cardiac Doppler, Color Doppler and Intracardiac            Opacification Agent Indications:    Syncope R55  History:        Patient has no prior history of Echocardiogram examinations.                 Risk Factors:Hypertension, Dyslipidemia and Former Smoker.  Sonographer:    Dondra Prader RVT RCS Referring Phys: [phone] Meryle Ready [facility]  Sonographer Comments: Suboptimal apical window. Image acquisition challenging due to respiratory motion. IMPRESSIONS  1. Left ventricular ejection fraction, by estimation, is 50 to 55%. The left ventricle has low normal function. The left ventricle has no regional wall motion abnormalities. There is mild left ventricular hypertrophy. Left ventricular diastolic parameters are indeterminate.  2. Right ventricular systolic function was not well visualized. The right ventricular size is not well visualized. There is mildly elevated pulmonary artery systolic pressure. The estimated right ventricular systolic pressure is 40.6 mmHg.  3. Left atrial size was moderately dilated.  4. Right atrial size was severely dilated.  5. The mitral valve is normal in structure. Trivial mitral valve regurgitation.  6. The aortic valve is tricuspid. Aortic valve  regurgitation is trivial. No aortic stenosis is present.  7. The inferior vena cava is dilated in size with <50% respiratory variability, suggesting right atrial pressure of 15 mmHg.  8. Large pericardial effusion, primarily localized around LV lateral wall measuring up to 2.2cm. IVC fixed/dilated, but no RA/RV collapse seen to suggest tamponade FINDINGS  Left Ventricle: Left ventricular ejection fraction, by estimation, is 50 to 55%. The left ventricle has low normal function. The left ventricle has no regional wall motion abnormalities. Definity contrast agent was given IV to delineate the left ventricular endocardial borders. The left ventricular internal cavity size was normal in size. There is mild left ventricular hypertrophy. Left ventricular diastolic parameters are indeterminate. Right Ventricle: The right ventricular size is not well visualized. Right vetricular wall thickness was not well visualized. Right ventricular systolic function was not well visualized. There is mildly elevated pulmonary artery systolic pressure. The tricuspid regurgitant velocity is 2.53 m/s, and with an assumed right atrial pressure of 15 mmHg, the estimated right ventricular systolic pressure is 40.6 mmHg. Left Atrium: Left atrial size was moderately dilated. Right Atrium: Right atrial size was severely dilated. Pericardium: A large pericardial effusion is present. Mitral Valve: The mitral valve is normal in structure. Trivial mitral valve regurgitation. Tricuspid Valve: The tricuspid valve is normal in structure. Tricuspid valve regurgitation is mild. Aortic Valve: The aortic valve is tricuspid. Aortic valve regurgitation is trivial. No aortic stenosis is present. Aortic valve mean gradient measures 2.7 mmHg. Aortic valve peak gradient measures 4.6 mmHg. Aortic valve area, by VTI measures 2.78 cm. Pulmonic Valve: The pulmonic valve was not well visualized. Pulmonic valve regurgitation is not visualized. Aorta: The aortic root  and ascending aorta are structurally normal, with no evidence of dilitation. Venous: The inferior vena cava is dilated in size with less than 50% respiratory variability, suggesting right atrial pressure of 15 mmHg. IAS/Shunts: The interatrial septum was not well visualized.  LEFT VENTRICLE PLAX 2D LVIDd:  4.70 cm LVIDs:         3.60 cm LV PW:         1.40 cm LV IVS:        1.40 cm LVOT diam:     2.30 cm LV SV:         65 LV SV Index:   27 LVOT Area:     4.15 cm  RIGHT VENTRICLE          IVC RV Basal diam:  4.30 cm  IVC diam: 3.00 cm TAPSE (M-mode): 2.2 cm LEFT ATRIUM              Index        RIGHT ATRIUM           Index LA diam:        4.70 cm  1.96 cm/m   RA Area:     33.30 cm LA Vol (A2C):   101.0 ml 42.22 ml/m  RA Volume:   125.00 ml 52.26 ml/m LA Vol (A4C):   121.0 ml 50.58 ml/m LA Biplane Vol: 111.0 ml 46.40 ml/m  AORTIC VALVE                    PULMONIC VALVE AV Area (Vmax):    3.42 cm     PV Vmax:       0.72 m/s AV Area (Vmean):   3.40 cm     PV Peak grad:  2.1 mmHg AV Area (VTI):     2.78 cm AV Vmax:           107.43 cm/s AV Vmean:          76.300 cm/s AV VTI:            0.234 m AV Peak Grad:      4.6 mmHg AV Mean Grad:      2.7 mmHg LVOT Vmax:         88.35 cm/s LVOT Vmean:        62.375 cm/s LVOT VTI:          0.156 m LVOT/AV VTI ratio: 0.67 AR Vena Contracta: 0.40 cm  AORTA Ao Root diam: 3.50 cm Ao Asc diam:  3.70 cm MITRAL VALVE               TRICUSPID VALVE MV Area (PHT): 4.06 cm    TR Peak grad:   25.6 mmHg MV Decel Time: 187 msec    TR Vmax:        253.00 cm/s MV E velocity: 90.63 cm/s MV A velocity: 61.57 cm/s  SHUNTS MV E/A ratio:  1.47        Systemic VTI:  0.16 m                            Systemic Diam: 2.30 cm Epifanio Lesches MD Electronically signed by Epifanio Lesches MD Signature Date/Time: 09/08/2022/4:52:33 PM    Final    CT Head Wo Contrast  Result Date: 09/07/2022 CLINICAL DATA:  Recent syncopal episode EXAM: CT HEAD WITHOUT CONTRAST TECHNIQUE: Contiguous  axial images were obtained from the base of the skull through the vertex without intravenous contrast. RADIATION DOSE REDUCTION: This exam was performed according to the departmental dose-optimization program which includes automated exposure control, adjustment of the mA and/or kV according to patient size and/or use of iterative reconstruction technique. COMPARISON:  None Available. FINDINGS: Brain: No evidence of acute infarction, hemorrhage, hydrocephalus,  extra-axial collection or mass lesion/mass effect. Mild atrophic changes are noted. Vascular: No hyperdense vessel or unexpected calcification. Skull: Normal. Negative for fracture or focal lesion. Sinuses/Orbits: Orbits and their contents are within normal limits. Paranasal sinuses demonstrate diffuse mucosal thickening within the left maxillary, ethmoid and frontal sinuses. Other: None. IMPRESSION: Atrophic changes without acute intracranial abnormality. Mucosal thickening within the paranasal sinuses. Electronically Signed   By: Alcide Clever M.D.   On: 09/07/2022 23:33   DG Chest Portable 1 View  Result Date: 09/07/2022 CLINICAL DATA:  Chest pain. EXAM: PORTABLE CHEST 1 VIEW COMPARISON:  February 27, 2021 FINDINGS: There is mild to moderate severity enlargement of the cardiac silhouette. Moderate to marked severity calcification of the aortic arch is seen. Mild atelectasis is seen within the left lung base. There is no evidence of a pleural effusion or pneumothorax. The visualized skeletal structures are unremarkable. IMPRESSION: Stable cardiomegaly with mild left basilar atelectasis. Electronically Signed   By: Aram Candela M.D.   On: 09/07/2022 23:21    Cardiac Studies   1. Left ventricular ejection fraction, by estimation, is 50 to 55%. The  left ventricle has low normal function. The left ventricle has no regional  wall motion abnormalities. There is mild left ventricular hypertrophy.  Left ventricular diastolic  parameters are  indeterminate.   2. Right ventricular systolic function was not well visualized. The right  ventricular size is not well visualized. There is mildly elevated  pulmonary artery systolic pressure. The estimated right ventricular  systolic pressure is 40.6 mmHg.   3. Left atrial size was moderately dilated.   4. Right atrial size was severely dilated.   5. The mitral valve is normal in structure. Trivial mitral valve  regurgitation.   6. The aortic valve is tricuspid. Aortic valve regurgitation is trivial.  No aortic stenosis is present.   7. The inferior vena cava is dilated in size with <50% respiratory  variability, suggesting right atrial pressure of 15 mmHg.   8. Large pericardial effusion, primarily localized around LV lateral wall  measuring up to 2.2cm. IVC fixed/dilated, but no RA/RV collapse seen to  suggest tamponade    Patient Profile     Gary [name] is a 87 y.o. male with a hx of longstanding atrial flutter with spontaneous slow ventricular rate, hypertension, hyperlipidemia, OSA and history of heart murmur who is being seen 09/08/2022 for the evaluation of syncope at the request of Dr. Frederick Peers.    Assessment & Plan    1  Syncope   Pt has had one spell   Occurred while sitting   No prodrome   Remembers waking up in EMS     HE has been bradycardic here but not severely  No documented  pauses   I have reviewed echo.  His RV is prominent, function is probably normal   RV was prominent in echo from 2016, may be more prominent    The IVC is also dilated. He now has a pericardial effusion (large in some areas), that is increased in size (was small in 2016)   Does not meet all criteria for tamponade by echo.  CLinically I am not convinced hemodynamically signficiant   His BP is high at times      Etiology of syncope   ? Pause         ? Could he have pulmonary HTN   (has OSA)     I have contacted EP    I would like them to  see/evaluate Pt just hooked up to [facility] Keep on  telemetry for now     Check orthostatics as I do not see done           2  Pericardial effusion  Circumferential, large in some places     TSH normal  Hgb minimally decreased  CXR without gross abnormalities Normal renal function   Overall volume appears to be up some(IVC dilated, JVP increased)  Would Rx with IV lasix once   Follow response, renal function  Check albumin in AM) LImited echo in future to follow      3  Atrial flutter  I would hold Eliquis and can Rx with IV heparin as needed pending possible procedure   4  Hx OSA   uses CPAP   For questions or updates, please contact [facility] Please consult [email or web address] for contact info under        Signed, Dietrich Pates, MD  09/09/2022, 7:09 AM

## 2022-09-09 NOTE — Assessment & Plan Note (Addendum)
-   Echo notes "large pericardial effusion" -Possible confounding variables presenting with hypotension, bradycardia, syncope - cardiology following, appreciate assistance - IV lasix x 1 - RHC performed on 5/13: normal LV function and no impact from pericardial effusion - he will have repeat echo outpatient for monitoring - follow up ESR/CRP

## 2022-09-09 NOTE — Progress Notes (Signed)
ANTICOAGULATION CONSULT NOTE - Follow Up Consult  Pharmacy Consult for Heparin Indication: atrial fibrillation  No Known Allergies  Patient Measurements: Height: 6\' 3"  (190.5 cm) Weight: 111.1 kg (245 lb) IBW/kg (Calculated) : 84.5 Heparin Dosing Weight: 91 kg  Vital Signs: Temp: 97.7 F (36.5 C) (05/11 1224) Temp Source: Oral (05/11 1224) BP: 154/81 (05/11 1224) Pulse Rate: 47 (05/11 1224)  Labs: Recent Labs    09/07/22 2304 09/08/22 0111 09/08/22 0547  HGB 13.3  --  12.5*  HCT 40.6  --  36.3*  PLT 140*  --  133*  CREATININE 1.46*  --  1.40*  TROPONINIHS 16 16  --     Estimated Creatinine Clearance: 52.8 mL/min (A) (by C-G formula based on SCr of 1.4 mg/dL (H)).  Assessment: 87 year old male to begin heparin for Aflutter Last dose of Eliquis given at 9 am  Planning pacemaker / procedure  Goal of Therapy:  aPTT 66-102 seconds Monitor platelets by anticoagulation protocol: Yes Heparin level 0.3-0.7   Plan:  Heparin at 1100 units / hr starting at 10 pm PTT and heparin level 8 hours after heparin begins Daily heparin level, PTT, CBC  Thank you Okey Regal, PharmD  09/09/2022,3:37 PM

## 2022-09-09 NOTE — Progress Notes (Signed)
Progress Note    DETROIT FADELEY   BJY:[id number]  DOB: [birth year]  DOA: 09/07/2022     1 PCP: Kristian Covey, MD  Initial CC: syncope  Hospital Course: Gary [name] is an 87 y.o. male with history of typical atrial flutter with slow response, hypertension, hyperlipidemia, sleep apnea, gout, rosacea was brought to the ER after patient had a brief episode of loss of consciousness.   Patient stated he was having supper with his friends when he lost consciousness.  It was the 1 year anniversary of his wife passing away 1 year ago.  He did endorse feeling upset to some degree but was not panicking or tearful prior to the event.   He did not have any prodromal symptoms.  Did not have any chest pain or shortness of breath.  He does not exactly remember the duration of his loss of consciousness but he thinks it was not long.  He did not have any focal deficits.  EMS on arrival found patient was bradycardic in the 40s and hypotensive with systolic in the 70s.  Was given 500 cc fluid bolus was brought to the ER.   In the ER patient is found to be in atrial fibrillation rate around 60 bpm. CT head unremarkable.  Labs are largely at baseline. Troponins were negative.  Patient admitted for syncope.  Interval History:  No events overnight. Feels okay today still. No CP, palpitations, SOB.   Assessment and Plan: * Syncope - Patient was already sitting down eating when episode happened. He was upset thinking about his wife passing 1 year prior and a vasovagal event is possible but other considered differential includes symptomatic bradycardia given worsened bradycardia from baseline and hypotension with event - has followed with cardiology prior for aflutter with slow VR and has been told may need PPM in the future  - continue on tele -TSH normal - cardiology following - for now monitoring on tele/zio patch placed 5/10 as well - further EP workup pending   Pericardial effusion - Echo notes  "large pericardial effusion" -Possible confounding variables presenting with hypotension, bradycardia, syncope - cardiology following, appreciate assistance - follow up orthostatics - IV lasix x 1 ordered today per cardiology - follow up albumin in AM  Typical atrial flutter (HCC) - Eliquis on hold in case of procedure - heparin bridge for now  Macrocytosis - B12 level 385  Gout - Continue allopurinol  Rosacea Continue minocycline  Essential hypertension - Lisinopril on hold in setting of soft blood pressure on admission  Obstructive sleep apnea - Continue nightly CPAP  Hypercholesterolemia - Continue simvastatin   Old records reviewed in assessment of this patient  Antimicrobials:   DVT prophylaxis:   Heparin drip  Code Status:   Code Status: Full Code  Mobility Assessment (last 72 hours)     Mobility Assessment     Row Name 09/09/22 0755 09/08/22 1000 09/08/22 0618       Does patient have an order for bedrest or is patient medically unstable No - Continue assessment No - Continue assessment No - Continue assessment     What is the highest level of mobility based on the progressive mobility assessment? Level 5 (Walks with assist in room/hall) - Balance while stepping forward/back and can walk in room with assist - Complete Level 5 (Walks with assist in room/hall) - Balance while stepping forward/back and can walk in room with assist - Complete Level 5 (Walks with assist in room/hall) - Balance  while stepping forward/back and can walk in room with assist - Complete              Barriers to discharge: none Disposition Plan:  Home Status is: Inpt  Objective: Blood pressure (!) 154/81, pulse (!) 47, temperature 97.7 F (36.5 C), temperature source Oral, resp. rate 20, height 6\' 3"  (1.905 m), weight 111.1 kg, SpO2 98 %.  Examination:  Physical Exam Constitutional:      General: He is not in acute distress.    Appearance: Normal appearance.  HENT:      Head: Normocephalic and atraumatic.     Mouth/Throat:     Mouth: Mucous membranes are moist.  Eyes:     Extraocular Movements: Extraocular movements intact.  Cardiovascular:     Rate and Rhythm: Bradycardia present. Rhythm irregular.  Pulmonary:     Effort: Pulmonary effort is normal. No respiratory distress.     Breath sounds: Normal breath sounds. No wheezing.  Abdominal:     General: Bowel sounds are normal. There is no distension.     Palpations: Abdomen is soft.     Tenderness: There is no abdominal tenderness.  Musculoskeletal:        General: Normal range of motion.     Cervical back: Normal range of motion and neck supple.  Skin:    General: Skin is warm and dry.  Neurological:     General: No focal deficit present.     Mental Status: He is alert.  Psychiatric:        Mood and Affect: Mood normal.        Behavior: Behavior normal.      Consultants:  Cardiology  Procedures:    Data Reviewed: No results found for this or any previous visit (from the past 24 hour(s)).   I have reviewed pertinent nursing notes, vitals, labs, and images as necessary. I have ordered labwork to follow up on as indicated.  I have reviewed the last notes from staff over past 24 hours. I have discussed patient's care plan and test results with nursing staff, CM/SW, and other staff as appropriate.  Time spent: Greater than 50% of the 55 minute visit was spent in counseling/coordination of care for the patient as laid out in the A&P.   LOS: 1 day   Lewie Chamber, MD [facility] 09/09/2022, 3:29 PM

## 2022-09-10 DIAGNOSIS — I3139 Other pericardial effusion (noninflammatory): Secondary | ICD-10-CM | POA: Diagnosis not present

## 2022-09-10 DIAGNOSIS — I483 Typical atrial flutter: Secondary | ICD-10-CM | POA: Diagnosis not present

## 2022-09-10 DIAGNOSIS — R55 Syncope and collapse: Secondary | ICD-10-CM | POA: Diagnosis not present

## 2022-09-10 LAB — CBC
HCT: 39.9 % (ref 39.0–52.0)
Hemoglobin: 13.3 g/dL (ref 13.0–17.0)
MCH: 32.8 pg (ref 26.0–34.0)
MCHC: 33.3 g/dL (ref 30.0–36.0)
MCV: 98.5 fL (ref 80.0–100.0)
Platelets: 128 10*3/uL — ABNORMAL LOW (ref 150–400)
RBC: 4.05 MIL/uL — ABNORMAL LOW (ref 4.22–5.81)
RDW: 13.4 % (ref 11.5–15.5)
WBC: 6.1 10*3/uL (ref 4.0–10.5)
nRBC: 0 % (ref 0.0–0.2)

## 2022-09-10 LAB — COMPREHENSIVE METABOLIC PANEL
ALT: 20 U/L (ref 0–44)
AST: 18 U/L (ref 15–41)
Albumin: 3 g/dL — ABNORMAL LOW (ref 3.5–5.0)
Alkaline Phosphatase: 56 U/L (ref 38–126)
Anion gap: 7 (ref 5–15)
BUN: 47 mg/dL — ABNORMAL HIGH (ref 8–23)
CO2: 22 mmol/L (ref 22–32)
Calcium: 8.5 mg/dL — ABNORMAL LOW (ref 8.9–10.3)
Chloride: 109 mmol/L (ref 98–111)
Creatinine, Ser: 1.35 mg/dL — ABNORMAL HIGH (ref 0.61–1.24)
GFR, Estimated: 52 mL/min — ABNORMAL LOW (ref >60)
Glucose, Bld: 119 mg/dL — ABNORMAL HIGH (ref 70–99)
Potassium: 4.6 mmol/L (ref 3.5–5.1)
Sodium: 138 mmol/L (ref 135–145)
Total Bilirubin: 0.8 mg/dL (ref 0.3–1.2)
Total Protein: 5.6 g/dL — ABNORMAL LOW (ref 6.5–8.1)

## 2022-09-10 LAB — HEPARIN LEVEL (UNFRACTIONATED): Heparin Unfractionated: >1.1 IU/mL — ABNORMAL HIGH (ref 0.30–0.70)

## 2022-09-10 LAB — APTT
aPTT: 50 seconds — ABNORMAL HIGH (ref 24–36)
aPTT: 62 seconds — ABNORMAL HIGH (ref 24–36)

## 2022-09-10 LAB — SEDIMENTATION RATE: Sed Rate: 8 mm/hr (ref 0–16)

## 2022-09-10 LAB — MAGNESIUM: Magnesium: 1.9 mg/dL (ref 1.7–2.4)

## 2022-09-10 MED ORDER — SODIUM CHLORIDE 0.9% FLUSH
3.0000 mL | Freq: Two times a day (BID) | INTRAVENOUS | Status: DC
  Administered 2022-09-11: 3 mL via INTRAVENOUS

## 2022-09-10 MED ORDER — SODIUM CHLORIDE 0.9 % IV SOLN: INTRAVENOUS | Status: DC

## 2022-09-10 MED ORDER — ASPIRIN 81 MG PO CHEW
81.0000 mg | CHEWABLE_TABLET | ORAL | Status: AC
  Administered 2022-09-11: 81 mg via ORAL
  Filled 2022-09-10: qty 1

## 2022-09-10 MED ORDER — SODIUM CHLORIDE 0.9% FLUSH: 3.0000 mL | INTRAVENOUS | Status: DC | PRN

## 2022-09-10 MED ORDER — SODIUM CHLORIDE 0.9 % IV SOLN: 250.0000 mL | INTRAVENOUS | Status: DC | PRN

## 2022-09-10 NOTE — Progress Notes (Signed)
Rounding Note    Patient Name: Gary [name] Date of Encounter: 09/10/2022  [facility] Cardiologist: Thurmon Fair, MD   Subjective  Breathing OK   No dizziness  No CP   Inpatient Medications    Scheduled Meds:  allopurinol  200 mg Oral Daily   minocycline  100 mg Oral Q M,W,F   simvastatin  20 mg Oral Q M,W,F   tamsulosin  0.4 mg Oral Daily   Continuous Infusions:  heparin 1,400 Units/hr (09/10/22 1232)   PRN Meds: hydrALAZINE   Vital Signs    Vitals:   09/09/22 1224 09/09/22 1804 09/09/22 1923 09/10/22 0422  BP: (!) 154/81 (!) 170/80 (!) 149/86 (!) 153/82  Pulse: (!) 47 83 64 63  Resp: 20 18 18    Temp: 97.7 F (36.5 C) 98.3 F (36.8 C) 97.9 F (36.6 C) 97.8 F (36.6 C)  TempSrc: Oral Oral Oral Oral  SpO2: 98% 99% 97% 97%  Weight:      Height:        Intake/Output Summary (Last 24 hours) at 09/10/2022 1411 Last data filed at 09/10/2022 0815 Gross per 24 hour  Intake 316.98 ml  Output 2400 ml  Net -2083.02 ml      09/07/2022   11:07 PM 06/05/2022   11:48 AM 05/26/2022   11:07 AM  Last 3 Weights  Weight (lbs) 245 lb 249 lb 9.6 oz 246 lb  Weight (kg) 111.131 kg 113.218 kg 111.585 kg      Telemetry    Atrial flutter  40s to 60s    Personally Reviewed   Physical Exam   GEN: No acute distress.  Cardiac: Irreg irreg  No S3    Respiratory: CTA  GI: Soft, nontender, non-distended  MS: Tr  edema   Labs    High Sensitivity Troponin:   Recent Labs  Lab 09/07/22 2304 09/08/22 0111  TROPONINIHS 16 16     Chemistry Recent Labs  Lab 09/07/22 2304 09/08/22 0547 09/10/22 0548  NA 137 138 138  K 4.7 4.8 4.6  CL 107 107 109  CO2 21* 19* 22  GLUCOSE 100* 109* 119*  BUN 45* 46* 47*  CREATININE 1.46* 1.40* 1.35*  CALCIUM 8.2* 8.2* 8.5*  MG  --   --  1.9  PROT  --   --  5.6*  ALBUMIN  --   --  3.0*  AST  --   --  18  ALT  --   --  20  ALKPHOS  --   --  56  BILITOT  --   --  0.8  GFRNONAA 47* 50* 52*  ANIONGAP 9 12 7      Lipids No results for input(s): "CHOL", "TRIG", "HDL", "LABVLDL", "LDLCALC", "CHOLHDL" in the last 168 hours.  Hematology Recent Labs  Lab 09/07/22 2304 09/08/22 0547 09/10/22 0548  WBC 7.0 6.1 6.1  RBC 3.95* 3.68* 4.05*  HGB 13.3 12.5* 13.3  HCT 40.6 36.3* 39.9  MCV 102.8* 98.6 98.5  MCH 33.7 34.0 32.8  MCHC 32.8 34.4 33.3  RDW 13.3 13.3 13.4  PLT 140* 133* 128*   Thyroid  Recent Labs  Lab 09/08/22 0547  TSH 3.626    BNPNo results for input(s): "BNP", "PROBNP" in the last 168 hours.  DDimer No results for input(s): "DDIMER" in the last 168 hours.   Radiology    ECHOCARDIOGRAM COMPLETE  Result Date: 09/08/2022    ECHOCARDIOGRAM REPORT   Patient Name:   Gary [name] Date of  Exam: 09/08/2022 Medical Rec #:  [id number]       Height:       75.0 in Accession #:    [id number]      Weight:       245.0 lb Date of Birth:  01-22-1936      BSA:          2.392 m Patient Age:    86 years        BP:           148/79 mmHg Patient Gender: M               HR:           58 bpm. Exam Location:  Inpatient Procedure: 2D Echo, Cardiac Doppler, Color Doppler and Intracardiac            Opacification Agent Indications:    Syncope R55  History:        Patient has no prior history of Echocardiogram examinations.                 Risk Factors:Hypertension, Dyslipidemia and Former Smoker.  Sonographer:    Dondra Prader RVT RCS Referring Phys: [phone] Meryle Ready [facility]  Sonographer Comments: Suboptimal apical window. Image acquisition challenging due to respiratory motion. IMPRESSIONS  1. Left ventricular ejection fraction, by estimation, is 50 to 55%. The left ventricle has low normal function. The left ventricle has no regional wall motion abnormalities. There is mild left ventricular hypertrophy. Left ventricular diastolic parameters are indeterminate.  2. Right ventricular systolic function was not well visualized. The right ventricular size is not well visualized. There is mildly elevated pulmonary artery  systolic pressure. The estimated right ventricular systolic pressure is 40.6 mmHg.  3. Left atrial size was moderately dilated.  4. Right atrial size was severely dilated.  5. The mitral valve is normal in structure. Trivial mitral valve regurgitation.  6. The aortic valve is tricuspid. Aortic valve regurgitation is trivial. No aortic stenosis is present.  7. The inferior vena cava is dilated in size with <50% respiratory variability, suggesting right atrial pressure of 15 mmHg.  8. Large pericardial effusion, primarily localized around LV lateral wall measuring up to 2.2cm. IVC fixed/dilated, but no RA/RV collapse seen to suggest tamponade FINDINGS  Left Ventricle: Left ventricular ejection fraction, by estimation, is 50 to 55%. The left ventricle has low normal function. The left ventricle has no regional wall motion abnormalities. Definity contrast agent was given IV to delineate the left ventricular endocardial borders. The left ventricular internal cavity size was normal in size. There is mild left ventricular hypertrophy. Left ventricular diastolic parameters are indeterminate. Right Ventricle: The right ventricular size is not well visualized. Right vetricular wall thickness was not well visualized. Right ventricular systolic function was not well visualized. There is mildly elevated pulmonary artery systolic pressure. The tricuspid regurgitant velocity is 2.53 m/s, and with an assumed right atrial pressure of 15 mmHg, the estimated right ventricular systolic pressure is 40.6 mmHg. Left Atrium: Left atrial size was moderately dilated. Right Atrium: Right atrial size was severely dilated. Pericardium: A large pericardial effusion is present. Mitral Valve: The mitral valve is normal in structure. Trivial mitral valve regurgitation. Tricuspid Valve: The tricuspid valve is normal in structure. Tricuspid valve regurgitation is mild. Aortic Valve: The aortic valve is tricuspid. Aortic valve regurgitation is  trivial. No aortic stenosis is present. Aortic valve mean gradient measures 2.7 mmHg. Aortic valve peak gradient measures 4.6 mmHg. Aortic valve area, by  VTI measures 2.78 cm. Pulmonic Valve: The pulmonic valve was not well visualized. Pulmonic valve regurgitation is not visualized. Aorta: The aortic root and ascending aorta are structurally normal, with no evidence of dilitation. Venous: The inferior vena cava is dilated in size with less than 50% respiratory variability, suggesting right atrial pressure of 15 mmHg. IAS/Shunts: The interatrial septum was not well visualized.  LEFT VENTRICLE PLAX 2D LVIDd:         4.70 cm LVIDs:         3.60 cm LV PW:         1.40 cm LV IVS:        1.40 cm LVOT diam:     2.30 cm LV SV:         65 LV SV Index:   27 LVOT Area:     4.15 cm  RIGHT VENTRICLE          IVC RV Basal diam:  4.30 cm  IVC diam: 3.00 cm TAPSE (M-mode): 2.2 cm LEFT ATRIUM              Index        RIGHT ATRIUM           Index LA diam:        4.70 cm  1.96 cm/m   RA Area:     33.30 cm LA Vol (A2C):   101.0 ml 42.22 ml/m  RA Volume:   125.00 ml 52.26 ml/m LA Vol (A4C):   121.0 ml 50.58 ml/m LA Biplane Vol: 111.0 ml 46.40 ml/m  AORTIC VALVE                    PULMONIC VALVE AV Area (Vmax):    3.42 cm     PV Vmax:       0.72 m/s AV Area (Vmean):   3.40 cm     PV Peak grad:  2.1 mmHg AV Area (VTI):     2.78 cm AV Vmax:           107.43 cm/s AV Vmean:          76.300 cm/s AV VTI:            0.234 m AV Peak Grad:      4.6 mmHg AV Mean Grad:      2.7 mmHg LVOT Vmax:         88.35 cm/s LVOT Vmean:        62.375 cm/s LVOT VTI:          0.156 m LVOT/AV VTI ratio: 0.67 AR Vena Contracta: 0.40 cm  AORTA Ao Root diam: 3.50 cm Ao Asc diam:  3.70 cm MITRAL VALVE               TRICUSPID VALVE MV Area (PHT): 4.06 cm    TR Peak grad:   25.6 mmHg MV Decel Time: 187 msec    TR Vmax:        253.00 cm/s MV E velocity: 90.63 cm/s MV A velocity: 61.57 cm/s  SHUNTS MV E/A ratio:  1.47        Systemic VTI:  0.16 m                             Systemic Diam: 2.30 cm Epifanio Lesches MD Electronically signed by Epifanio Lesches MD Signature Date/Time: 09/08/2022/4:52:33 PM    Final     Cardiac Studies   1.  Left ventricular ejection fraction, by estimation, is 50 to 55%. The  left ventricle has low normal function. The left ventricle has no regional  wall motion abnormalities. There is mild left ventricular hypertrophy.  Left ventricular diastolic  parameters are indeterminate.   2. Right ventricular systolic function was not well visualized. The right  ventricular size is not well visualized. There is mildly elevated  pulmonary artery systolic pressure. The estimated right ventricular  systolic pressure is 40.6 mmHg.   3. Left atrial size was moderately dilated.   4. Right atrial size was severely dilated.   5. The mitral valve is normal in structure. Trivial mitral valve  regurgitation.   6. The aortic valve is tricuspid. Aortic valve regurgitation is trivial.  No aortic stenosis is present.   7. The inferior vena cava is dilated in size with <50% respiratory  variability, suggesting right atrial pressure of 15 mmHg.   8. Large pericardial effusion, primarily localized around LV lateral wall  measuring up to 2.2cm. IVC fixed/dilated, but no RA/RV collapse seen to  suggest tamponade    Patient Profile     CATHY FURY is a 87 y.o. male with a hx of longstanding atrial flutter with spontaneous slow ventricular rate, hypertension, hyperlipidemia, OSA and history of heart murmur who is being seen 09/08/2022 for the evaluation of syncope at the request of Dr. Frederick Peers.    Assessment & Plan    1  Syncope  Pt admitted after spell that occurred while sitting, after  a meal    No prodrome   Pt remembers waking up in EMS    SInce admit no severe bradycardia or pauses  No presyncope/syncope   ON review of echo his RV is prominent/dilated, function is probably normal   RV was prominent in echo from 2016, TTE  but it appears more so now.  Also echo shows pericardial effusion that is mod to larger and IVC is now  dilated.  Appreciate input from EP  ? Deglutition syncope     But given RV findings and pericardial effusion would recomm RHC to define hemodynamics. Has Zio patch already placed   Rheart cath  Explained risks/benefits of procedure to patient   He understands and agrees to proceed.    2  Pericardial effusion   Pt with small effusion in 2016   Now large   TSH normal  Albumin is a little low (3)   HGb is normal  CXR without signif lung dz    Plan WIll check ANA, ESR (no CP or recent illness) R heart cath as noted above to define hemodynamics Further eval based on findings     Will need to be followed over time    3  Atrial flutter    Had been on ELiquis  Now on heparin prior to cath  REsume after     4  Hx OSA  PT says he is using his CPAP regularly            For questions or updates, please contact [facility] Please consult [email or web address] for contact info under        Signed, Dietrich Pates, MD  09/10/2022, 2:11 PM

## 2022-09-10 NOTE — Progress Notes (Signed)
Progress Note    Gary [name]   ZOX:[id number]  DOB: 1935/10/18  DOA: 09/07/2022     2 PCP: Kristian Covey, MD  Initial CC: syncope  Hospital Course: Gary [name] is an 87 y.o. male with history of typical atrial flutter with slow response, hypertension, hyperlipidemia, sleep apnea, gout, rosacea was brought to the ER after patient had a brief episode of loss of consciousness.   Patient stated he was having supper with his friends when he lost consciousness.  It was the 1 year anniversary of his wife passing away 1 year ago.  He did endorse feeling upset to some degree but was not panicking or tearful prior to the event.   He did not have any prodromal symptoms.  Did not have any chest pain or shortness of breath.  He does not exactly remember the duration of his loss of consciousness but he thinks it was not long.  He did not have any focal deficits.  EMS on arrival found patient was bradycardic in the 40s and hypotensive with systolic in the 70s.  Was given 500 cc fluid bolus was brought to the ER.   In the ER patient is found to be in atrial fibrillation rate around 60 bpm. CT head unremarkable.  Labs are largely at baseline. Troponins were negative.  Patient admitted for syncope.  Interval History:  No events overnight.  Still asymptomatic and denies any chest pain, palpitations, shortness of breath. Seen in conjunction with cardiology this morning.  Tentative plan is for pursuing right heart cath next.   Assessment and Plan: * Syncope - Patient was already sitting down eating when episode happened. He was upset thinking about his wife passing 1 year prior and a vasovagal event is possible but other considered differential includes symptomatic bradycardia given worsened bradycardia from baseline and hypotension with event; large pericardial effusion may also be contributing and/or combo of above  - has followed with cardiology prior for aflutter with slow VR and has been told  may need PPM in the future  - continue on tele -TSH normal - cardiology and EP following - continue tele/zio - multiple considered etiologies at this time, see specialists notes; next step is pursuing RHC - allow BP to run on higher end in setting of hypotension on admission   Pericardial effusion - Echo notes "large pericardial effusion" -Possible confounding variables presenting with hypotension, bradycardia, syncope - cardiology following, appreciate assistance - IV lasix x 1  Typical atrial flutter (HCC) - Eliquis on hold in case of procedure - heparin bridge for now  Macrocytosis - B12 level 385  Gout - Continue allopurinol  Rosacea Continue minocycline  Essential hypertension - Lisinopril on hold in setting of soft blood pressure on admission  Obstructive sleep apnea - Continue nightly CPAP  Hypercholesterolemia - Continue simvastatin   Old records reviewed in assessment of this patient  Antimicrobials:   DVT prophylaxis:   Heparin drip  Code Status:   Code Status: Full Code  Mobility Assessment (last 72 hours)     Mobility Assessment     Row Name 09/09/22 2100 09/09/22 0755 09/08/22 1000 09/08/22 0618     Does patient have an order for bedrest or is patient medically unstable No - Continue assessment No - Continue assessment No - Continue assessment No - Continue assessment    What is the highest level of mobility based on the progressive mobility assessment? Level 6 (Walks independently in room and hall) - Balance while  walking in room without assist - Complete Level 5 (Walks with assist in room/hall) - Balance while stepping forward/back and can walk in room with assist - Complete Level 5 (Walks with assist in room/hall) - Balance while stepping forward/back and can walk in room with assist - Complete Level 5 (Walks with assist in room/hall) - Balance while stepping forward/back and can walk in room with assist - Complete             Barriers to  discharge: none Disposition Plan:  Home Status is: Inpt  Objective: Blood pressure (!) 153/82, pulse 63, temperature 97.8 F (36.6 C), temperature source Oral, resp. rate 18, height 6\' 3"  (1.905 m), weight 111.1 kg, SpO2 97 %.  Examination:  Physical Exam Constitutional:      General: He is not in acute distress.    Appearance: Normal appearance.  HENT:     Head: Normocephalic and atraumatic.     Mouth/Throat:     Mouth: Mucous membranes are moist.  Eyes:     Extraocular Movements: Extraocular movements intact.  Cardiovascular:     Rate and Rhythm: Regular rhythm. Bradycardia present.  Pulmonary:     Effort: Pulmonary effort is normal. No respiratory distress.     Breath sounds: Normal breath sounds. No wheezing.  Abdominal:     General: Bowel sounds are normal. There is no distension.     Palpations: Abdomen is soft.     Tenderness: There is no abdominal tenderness.  Musculoskeletal:        General: Normal range of motion.     Cervical back: Normal range of motion and neck supple.  Skin:    General: Skin is warm and dry.  Neurological:     General: No focal deficit present.     Mental Status: He is alert.  Psychiatric:        Mood and Affect: Mood normal.        Behavior: Behavior normal.      Consultants:  Cardiology  Procedures:    Data Reviewed: Results for orders placed or performed during the hospital encounter of 09/07/22 (from the past 24 hour(s))  Comprehensive metabolic panel     Status: Abnormal   Collection Time: 09/10/22  5:48 AM  Result Value Ref Range   Sodium 138 135 - 145 mmol/L   Potassium 4.6 3.5 - 5.1 mmol/L   Chloride 109 98 - 111 mmol/L   CO2 22 22 - 32 mmol/L   Glucose, Bld 119 (H) 70 - 99 mg/dL   BUN 47 (H) 8 - 23 mg/dL   Creatinine, Ser 8.29 (H) 0.61 - 1.24 mg/dL   Calcium 8.5 (L) 8.9 - 10.3 mg/dL   Total Protein 5.6 (L) 6.5 - 8.1 g/dL   Albumin 3.0 (L) 3.5 - 5.0 g/dL   AST 18 15 - 41 U/L   ALT 20 0 - 44 U/L   Alkaline  Phosphatase 56 38 - 126 U/L   Total Bilirubin 0.8 0.3 - 1.2 mg/dL   GFR, Estimated 52 (L) >60 mL/min   Anion gap 7 5 - 15  Magnesium     Status: None   Collection Time: 09/10/22  5:48 AM  Result Value Ref Range   Magnesium 1.9 1.7 - 2.4 mg/dL  Heparin level (unfractionated)     Status: Abnormal   Collection Time: 09/10/22  5:48 AM  Result Value Ref Range   Heparin Unfractionated >1.10 (H) 0.30 - 0.70 IU/mL  APTT     Status:  Abnormal   Collection Time: 09/10/22  5:48 AM  Result Value Ref Range   aPTT 50 (H) 24 - 36 seconds  CBC     Status: Abnormal   Collection Time: 09/10/22  5:48 AM  Result Value Ref Range   WBC 6.1 4.0 - 10.5 K/uL   RBC 4.05 (L) 4.22 - 5.81 MIL/uL   Hemoglobin 13.3 13.0 - 17.0 g/dL   HCT 40.9 81.1 - 91.4 %   MCV 98.5 80.0 - 100.0 fL   MCH 32.8 26.0 - 34.0 pg   MCHC 33.3 30.0 - 36.0 g/dL   RDW 78.2 95.6 - 21.3 %   Platelets 128 (L) 150 - 400 K/uL   nRBC 0.0 0.0 - 0.2 %     I have reviewed pertinent nursing notes, vitals, labs, and images as necessary. I have ordered labwork to follow up on as indicated.  I have reviewed the last notes from staff over past 24 hours. I have discussed patient's care plan and test results with nursing staff, CM/SW, and other staff as appropriate.  Time spent: Greater than 50% of the 55 minute visit was spent in counseling/coordination of care for the patient as laid out in the A&P.   LOS: 2 days   Lewie Chamber, MD [facility] 09/10/2022, 2:34 PM

## 2022-09-10 NOTE — Progress Notes (Signed)
ANTICOAGULATION CONSULT NOTE - Follow Up Consult  Pharmacy Consult for Heparin Indication: atrial fibrillation  No Known Allergies  Patient Measurements: Height: 6\' 3"  (190.5 cm) Weight: 111.1 kg (245 lb) IBW/kg (Calculated) : 84.5 Heparin Dosing Weight: 91 kg  Vital Signs: Temp: 97.9 F (36.6 C) (05/12 1511) Temp Source: Oral (05/12 1511) BP: 152/90 (05/12 1511) Pulse Rate: 63 (05/12 0422)  Labs: Recent Labs    09/07/22 2304 09/08/22 0111 09/08/22 0547 09/10/22 0548 09/10/22 1448  HGB 13.3  --  12.5* 13.3  --   HCT 40.6  --  36.3* 39.9  --   PLT 140*  --  133* 128*  --   APTT  --   --   --  50* 62*  HEPARINUNFRC  --   --   --  >1.10*  --   CREATININE 1.46*  --  1.40* 1.35*  --   TROPONINIHS 16 16  --   --   --      Estimated Creatinine Clearance: 54.8 mL/min (A) (by C-G formula based on SCr of 1.35 mg/dL (H)).  Assessment: 87 year old male to begin heparin for Aflutter. Last dose of Eliquis given 5/11 at 9 am. Due to recent DOAC use, anticipate elevated heparin levels, will monitor aPTTs until correlation. Planning pacemaker / procedure.   aPTT is subtherapeutic at 62 sec, on 1400 units/hr. CBC stable.  No s/sx of bleeding or infusion issues.   Goal of Therapy:  aPTT 66-102 seconds Monitor platelets by anticoagulation protocol: Yes Heparin level 0.3-0.7   Plan:  Increase heparin to 1550 units / hr  Check aPTT and heparin level in 8 hours with AM labs Daily heparin level, PTT, CBC  Thank you for allowing pharmacy to participate in this patient's care,  Sherron Monday, PharmD, BCCCP Clinical Pharmacist  Phone: [phone] 09/10/2022 4:07 PM  Please check AMION for all [facility] phone numbers After 10:00 PM, call Main Pharmacy [phone]

## 2022-09-10 NOTE — Consult Note (Signed)
ELECTROPHYSIOLOGY CONSULT NOTE    Patient ID: Gary [name] MRN: [id number], DOB/AGE: 06-20-1935 87 y.o.  Admit date: 09/07/2022 Date of Consult: 09/10/2022  Primary Physician: Kristian Covey, MD Primary Cardiologist: [place] Electrophysiologist: not established  Patient Profile: Gary [name] is a 87 y.o. male with a history of hypertension, sleep apnea, atrial flutter who is being seen today for the evaluation of Syncope at the request of Dr. Tenny Craw.  HPI:  Gary [name] is a 87 y.o. male with a history of atrial flutter and bradycardia. He has had spontaneously slow ventricular rates in the past, but events have never been severe, nor has bradycardia been associated with symptoms.  He is now admitted after an episode of syncope.  He is very active, works out at [employer], and can elevate his heart rate.  In the evening, prior to his admission, he was at the dinner table eating supper with friends when he spontaneously lost consciousness.  He has since spoken with his friends who have told him that he was out for at least 15 minutes.  He d does not recall having having any prodromal symptoms --but he also does not recall the conversation or the details of the events in the several moments prior to loss of consciousness.  His dinner companions are both retired Pharmacist, community.  He spoken with him since the event --they did not indicate to him that he needed CPR or was pulseless.  When he came to, he was in the EMS truck.  On EMS arrival, his blood pressure was 70/40, and heart rate 40 bpm.  On arrival to [facility], he was in atrial flutter with controlled ventricular rates.  Overnight, at about 2 AM, he did have a brief conversion to sinus rhythm without a significant pause, according to prior notes.  The telemetry events is not available at present.  H his wife passed away about a year ago, and he has often been having dinner with friends.  In the past year, he has had 2 events during a  dinner with friends and when she is had unexplained dizziness and lightheadedness that was concerning to him as well as his dinner companions.  With those, he did not have any associated nausea, flushing, GI upset or other symptoms often associated with vasovagal activity.  He exercises regularly and easily achieve a heart rate in the 90s.  He denies chest pain, palpitations, dyspnea, PND, orthopnea, nausea, vomiting, dizziness, syncope, edema, or weight gain.  Past Medical History:  Diagnosis Date   Adenomatous colon polyp 06/2000   ALLERGIC RHINITIS 10/03/2007   Allergy    seasonal   Arthritis    knees   Atrial flutter (HCC) 06/26/2014   Cancer (HCC)    skin cancers removed in the past basal cell and squamous cell   CARDIAC MURMUR, AORTIC 10/03/2007   ERECTILE DYSFUNCTION 10/03/2007   High blood pressure 03/2016   HYPERLIPIDEMIA 10/03/2007   Rosacea    Sleep apnea       Home medications Facility-Administered Medications Prior to Admission  Medication Dose Route Frequency Provider Last Rate Last Admin   0.9 %  sodium chloride infusion  500 mL Intravenous Once Meryl Dare, MD       Medications Prior to Admission  Medication Sig Dispense Refill Last Dose   allopurinol (ZYLOPRIM) 100 MG tablet TAKE 2 TABLETS EVERY DAY 120 tablet 5 09/07/2022 at am   co-enzyme Q-10 30 MG capsule Take 30 mg by mouth  every Monday, Wednesday, and Friday.   09/06/2022 at am   ELIQUIS 5 MG TABS tablet TAKE 1 TABLET TWICE DAILY 180 tablet 3 09/07/2022 at 1000   fluticasone (FLONASE) 50 MCG/ACT nasal spray Place 1 spray into both nostrils every other day. (Patient taking differently: Place 1 spray into both nostrils every Monday, Wednesday, and Friday. Nightly) 16 g 4 09/06/2022   lisinopril (ZESTRIL) 10 MG tablet Take 1 tablet (10 mg total) by mouth daily. 90 tablet 3 09/07/2022 at am   minocycline (MINOCIN,DYNACIN) 100 MG capsule Take 100 mg by mouth every Monday, Wednesday, and Friday.   09/07/2022   Multiple Vitamin  (MULTIVITAMIN) tablet Take 1 tablet by mouth daily.   09/07/2022 at am   simvastatin (ZOCOR) 20 MG tablet TAKE 1 TABLET EVERY OTHER DAY (Patient taking differently: Take 20 mg by mouth every Monday, Wednesday, and Friday.) 45 tablet 3 09/06/2022 at pm   tamsulosin (FLOMAX) 0.4 MG CAPS capsule Take 1 capsule (0.4 mg total) by mouth daily. 90 capsule 3 09/07/2022 at am      Physical Exam: Vitals:   09/09/22 1224 09/09/22 1804 09/09/22 1923 09/10/22 0422  BP: (!) 154/81 (!) 170/80 (!) 149/86 (!) 153/82  Pulse: (!) 47 83 64 63  Resp: 20 18 18    Temp: 97.7 F (36.5 C) 98.3 F (36.8 C) 97.9 F (36.6 C) 97.8 F (36.6 C)  TempSrc: Oral Oral Oral Oral  SpO2: 98% 99% 97% 97%  Weight:      Height:        Gen: Appears comfortable, well-nourished CV: RRR (suspect regularized flutter), no dependent edema; no M/R/G Pulm: breathing easily, no rales, rhonchi  PERTINENT STUDIES SUMMARIZED:  Echocardiogram: TEE 06/2014 EF 55-60%, moderate MR TTE 09/08/22: Normal EF, normal valve structure/function.  Right atrium is severely dilated; left atrium is moderately dilated  Heart Cath:    Imaging:   CXR, CT head reviewed   EKG:   Atrial flutter with normal ventricular rates. Normal QRS (personally reviewed)  TELEMETRY:    Atrial flutter with normal rate range (personally reviewed)  DEVICE HISTORY:    NA   ASSESSMENT & PLAN:  Syncope Given the duration of LOC, I suspect there is something other than or in addition to bradycardia -- ie hypotension from a vagal episode Unsure of lack of prodrome is accurate or simply retrograde amnesia I don't think we have met the burden of evidence for pacemaker placement A monitor has already been placed; if this is unrevealing and no other cause of syncope it found, I would recommend loop recorder.  Pericardial effusion I reviewed imaging with Dr. Tenny Craw. The effusion is large but without evidence of tamponade. I wonder if he has some vasodepressive response  from vagal episode that is exacerbated by the effusion Dr. Tenny Craw and I discussed right heart cath to fully assess right heart function and pressures  Atrial flutter Continue eliquis 5mg   Sleep apnea   Hypertension Bps elevated, Per primary  EP will continue to follow remotely. Please contact me if there for any changes or questions.  For questions or updates, please contact [facility] Please consult [email or web address] for contact info under Cardiology/STEMI.  Signed, York Pellant, MD 09/10/2022 8:52 AM

## 2022-09-10 NOTE — Progress Notes (Signed)
ANTICOAGULATION CONSULT NOTE - Follow Up Consult  Pharmacy Consult for Heparin Indication: atrial fibrillation  No Known Allergies  Patient Measurements: Height: 6\' 3"  (190.5 cm) Weight: 111.1 kg (245 lb) IBW/kg (Calculated) : 84.5 Heparin Dosing Weight: 91 kg  Vital Signs: Temp: 97.8 F (36.6 C) (05/12 0422) Temp Source: Oral (05/12 0422) BP: 153/82 (05/12 0422) Pulse Rate: 63 (05/12 0422)  Labs: Recent Labs    09/07/22 2304 09/08/22 0111 09/08/22 0547 09/10/22 0548  HGB 13.3  --  12.5* 13.3  HCT 40.6  --  36.3* 39.9  PLT 140*  --  133* 128*  APTT  --   --   --  50*  HEPARINUNFRC  --   --   --  >1.10*  CREATININE 1.46*  --  1.40* 1.35*  TROPONINIHS 16 16  --   --      Estimated Creatinine Clearance: 54.8 mL/min (A) (by C-G formula based on SCr of 1.35 mg/dL (H)).  Assessment: 87 year old male to begin heparin for Aflutter. Last dose of Eliquis given 5/11 at 9 am. Due to recent DOAC use, anticipate elevated heparin levels, will monitor aPTTs until correlation. Planning pacemaker / procedure.   aPTT sub therapeutic at 50 sec. CBC stable.  No issues with infusion or bleeding noted.   Goal of Therapy:  aPTT 66-102 seconds Monitor platelets by anticoagulation protocol: Yes Heparin level 0.3-0.7   Plan:  Increase heparin to 1400 units / hr  Check aPTT and heparin level in 8 hours  Daily heparin level, PTT, CBC  Andreas Ohm, PharmD Pharmacy Resident  09/10/2022 7:34 AM

## 2022-09-11 ENCOUNTER — Encounter (HOSPITAL_COMMUNITY)
Admission: EM | Disposition: A | Discharge status: Home / Self Care | Payer: Self-pay | Source: Home / Self Care | Attending: Internal Medicine

## 2022-09-11 ENCOUNTER — Other Ambulatory Visit: Payer: Self-pay | Admitting: Medical

## 2022-09-11 DIAGNOSIS — I272 Pulmonary hypertension, unspecified: Secondary | ICD-10-CM

## 2022-09-11 DIAGNOSIS — R55 Syncope and collapse: Secondary | ICD-10-CM | POA: Diagnosis not present

## 2022-09-11 DIAGNOSIS — I483 Typical atrial flutter: Secondary | ICD-10-CM | POA: Diagnosis not present

## 2022-09-11 DIAGNOSIS — I3139 Other pericardial effusion (noninflammatory): Secondary | ICD-10-CM | POA: Diagnosis not present

## 2022-09-11 DIAGNOSIS — I3131 Malignant pericardial effusion in diseases classified elsewhere: Secondary | ICD-10-CM

## 2022-09-11 HISTORY — PX: RIGHT HEART CATH: CATH118263

## 2022-09-11 LAB — POCT I-STAT EG7
Acid-base deficit: 2 mmol/L (ref 0.0–2.0)
Acid-base deficit: 2 mmol/L (ref 0.0–2.0)
Bicarbonate: 23.7 mmol/L (ref 20.0–28.0)
Bicarbonate: 24 mmol/L (ref 20.0–28.0)
Calcium, Ion: 1.27 mmol/L (ref 1.15–1.40)
Calcium, Ion: 1.28 mmol/L (ref 1.15–1.40)
HCT: 42 % (ref 39.0–52.0)
HCT: 42 % (ref 39.0–52.0)
Hemoglobin: 14.3 g/dL (ref 13.0–17.0)
Hemoglobin: 14.3 g/dL (ref 13.0–17.0)
O2 Saturation: 66 %
O2 Saturation: 66 %
Potassium: 4.8 mmol/L (ref 3.5–5.1)
Potassium: 4.8 mmol/L (ref 3.5–5.1)
Sodium: 140 mmol/L (ref 135–145)
Sodium: 140 mmol/L (ref 135–145)
TCO2: 25 mmol/L (ref 22–32)
TCO2: 25 mmol/L (ref 22–32)
pCO2, Ven: 43.4 mmHg — ABNORMAL LOW (ref 44–60)
pCO2, Ven: 43.9 mmHg — ABNORMAL LOW (ref 44–60)
pH, Ven: 7.344 (ref 7.25–7.43)
pH, Ven: 7.346 (ref 7.25–7.43)
pO2, Ven: 36 mmHg (ref 32–45)
pO2, Ven: 36 mmHg (ref 32–45)

## 2022-09-11 LAB — BASIC METABOLIC PANEL
Anion gap: 4 — ABNORMAL LOW (ref 5–15)
BUN: 42 mg/dL — ABNORMAL HIGH (ref 8–23)
CO2: 22 mmol/L (ref 22–32)
Calcium: 8.4 mg/dL — ABNORMAL LOW (ref 8.9–10.3)
Chloride: 108 mmol/L (ref 98–111)
Creatinine, Ser: 1.33 mg/dL — ABNORMAL HIGH (ref 0.61–1.24)
GFR, Estimated: 53 mL/min — ABNORMAL LOW (ref >60)
Glucose, Bld: 141 mg/dL — ABNORMAL HIGH (ref 70–99)
Potassium: 4.4 mmol/L (ref 3.5–5.1)
Sodium: 134 mmol/L — ABNORMAL LOW (ref 135–145)

## 2022-09-11 LAB — APTT: aPTT: 76 seconds — ABNORMAL HIGH (ref 24–36)

## 2022-09-11 LAB — C-REACTIVE PROTEIN: CRP: 0.5 mg/dL (ref <1.0)

## 2022-09-11 LAB — HEPARIN LEVEL (UNFRACTIONATED): Heparin Unfractionated: 0.68 IU/mL (ref 0.30–0.70)

## 2022-09-11 LAB — SEDIMENTATION RATE: Sed Rate: 9 mm/hr (ref 0–16)

## 2022-09-11 LAB — MAGNESIUM: Magnesium: 1.9 mg/dL (ref 1.7–2.4)

## 2022-09-11 SURGERY — RIGHT HEART CATH
Anesthesia: LOCAL

## 2022-09-11 MED ORDER — HEPARIN (PORCINE) IN NACL 1000-0.9 UT/500ML-% IV SOLN
INTRAVENOUS | Status: DC | PRN
  Administered 2022-09-11: 500 mL

## 2022-09-11 MED ORDER — MORPHINE SULFATE (PF) 2 MG/ML IV SOLN: 2.0000 mg | INTRAVENOUS | Status: DC | PRN

## 2022-09-11 MED ORDER — LABETALOL HCL 5 MG/ML IV SOLN: 10.0000 mg | INTRAVENOUS | Status: DC | PRN

## 2022-09-11 MED ORDER — ONDANSETRON HCL 4 MG/2ML IJ SOLN: 4.0000 mg | Freq: Four times a day (QID) | INTRAMUSCULAR | Status: DC | PRN

## 2022-09-11 MED ORDER — SODIUM CHLORIDE 0.9 % IV SOLN: 250.0000 mL | INTRAVENOUS | Status: DC | PRN

## 2022-09-11 MED ORDER — APIXABAN 5 MG PO TABS
5.0000 mg | ORAL_TABLET | Freq: Two times a day (BID) | ORAL | Status: DC
  Administered 2022-09-11: 5 mg via ORAL
  Filled 2022-09-11: qty 1

## 2022-09-11 MED ORDER — ACETAMINOPHEN 325 MG PO TABS: 650.0000 mg | ORAL_TABLET | ORAL | Status: DC | PRN

## 2022-09-11 MED ORDER — HYDRALAZINE HCL 20 MG/ML IJ SOLN: 10.0000 mg | INTRAMUSCULAR | Status: DC | PRN

## 2022-09-11 MED ORDER — LIDOCAINE HCL (PF) 1 % IJ SOLN
INTRAMUSCULAR | Status: DC | PRN
  Administered 2022-09-11: 5 mL

## 2022-09-11 MED ORDER — SODIUM CHLORIDE 0.9 % IV SOLN: INTRAVENOUS | Status: DC

## 2022-09-11 MED ORDER — SODIUM CHLORIDE 0.9% FLUSH: 3.0000 mL | Freq: Two times a day (BID) | INTRAVENOUS | Status: DC

## 2022-09-11 MED ORDER — SODIUM CHLORIDE 0.9% FLUSH: 3.0000 mL | INTRAVENOUS | Status: DC | PRN

## 2022-09-11 SURGICAL SUPPLY — 6 items
CATH BALLN WEDGE 5F 110CM (CATHETERS) IMPLANT
PACK CARDIAC CATHETERIZATION (CUSTOM PROCEDURE TRAY) IMPLANT
SHEATH GLIDE SLENDER 4/5FR (SHEATH) IMPLANT
TRANSDUCER W/STOPCOCK (MISCELLANEOUS) IMPLANT
TUBING ART PRESS 72  MALE/FEM (TUBING) ×1
TUBING ART PRESS 72 MALE/FEM (TUBING) IMPLANT

## 2022-09-11 NOTE — Progress Notes (Addendum)
 Rounding Note    Patient Name: Gary [name] Date of Encounter: 09/11/2022  [facility] Cardiologist: Bonny Egger, MD   Subjective   No episodes of syncope or near syncope. Rhythm remains atrial flutter with slow ventricular response, heart rate dipping down into the high 30s at night, typically in the 50s during the day.  No significant pauses seen. Echo shows a large pericardial effusion without evidence of overt tamponade  Inpatient Medications    Scheduled Meds:  allopurinol  200 mg Oral Daily   minocycline  100 mg Oral Q M,W,F   simvastatin  20 mg Oral Q M,W,F   sodium chloride flush  3 mL Intravenous Q12H   tamsulosin  0.4 mg Oral Daily   Continuous Infusions:  sodium chloride     sodium chloride 10 mL/hr at 09/11/22 0911   heparin 1,550 Units/hr (09/11/22 0600)   PRN Meds: sodium chloride, hydrALAZINE, sodium chloride flush   Vital Signs    Vitals:   09/10/22 0422 09/10/22 1511 09/10/22 2021 09/11/22 0422  BP: (!) 153/82 (!) 152/90 (!) 147/69 139/70  Pulse: 63     Resp:  17    Temp: 97.8 F (36.6 C) 97.9 F (36.6 C) 98.2 F (36.8 C) 97.7 F (36.5 C)  TempSrc: Oral Oral Oral Oral  SpO2: 97%  98% 97%  Weight:      Height:        Intake/Output Summary (Last 24 hours) at 09/11/2022 1149 Last data filed at 09/11/2022 0600 Gross per 24 hour  Intake 786.16 ml  Output 1600 ml  Net -813.84 ml      09/07/2022   11:07 PM 06/05/2022   11:48 AM 05/26/2022   11:07 AM  Last 3 Weights  Weight (lbs) 245 lb 249 lb 9.6 oz 246 lb  Weight (kg) 111.131 kg 113.218 kg 111.585 kg      Telemetry    Atrial flutter with variable AV block- Personally Reviewed  ECG    No new tracing- Personally Reviewed  Physical Exam  Comfortable lying flat in bed GEN: No acute distress.   Neck: No JVD Cardiac: Irregular, no murmurs, rubs, or gallops.  Respiratory: Clear to auscultation bilaterally. GI: Soft, nontender, non-distended  MS: No edema; No  deformity. Neuro:  Nonfocal other than mild hard of hearing Psych: Normal affect   Labs    High Sensitivity Troponin:   Recent Labs  Lab 09/07/22 2304 09/08/22 0111  TROPONINIHS 16 16     Chemistry Recent Labs  Lab 09/08/22 0547 09/10/22 0548 09/11/22 0246  NA 138 138 134*  K 4.8 4.6 4.4  CL 107 109 108  CO2 19* 22 22  GLUCOSE 109* 119* 141*  BUN 46* 47* 42*  CREATININE 1.40* 1.35* 1.33*  CALCIUM 8.2* 8.5* 8.4*  MG  --  1.9 1.9  PROT  --  5.6*  --   ALBUMIN  --  3.0*  --   AST  --  18  --   ALT  --  20  --   ALKPHOS  --  56  --   BILITOT  --  0.8  --   GFRNONAA 50* 52* 53*  ANIONGAP 12 7 4*    Lipids No results for input(s): "CHOL", "TRIG", "HDL", "LABVLDL", "LDLCALC", "CHOLHDL" in the last 168 hours.  Hematology Recent Labs  Lab 09/07/22 2304 09/08/22 0547 09/10/22 0548  WBC 7.0 6.1 6.1  RBC 3.95* 3.68* 4.05*  HGB 13.3 12.5* 13.3  HCT 40.6 36.3*   39.9  MCV 102.8* 98.6 98.5  MCH 33.7 34.0 32.8  MCHC 32.8 34.4 33.3  RDW 13.3 13.3 13.4  PLT 140* 133* 128*   Thyroid  Recent Labs  Lab 09/08/22 0547  TSH 3.626    BNPNo results for input(s): "BNP", "PROBNP" in the last 168 hours.  DDimer No results for input(s): "DDIMER" in the last 168 hours.   Radiology    No results found.  Cardiac Studies   Echocardiogram 09/08/2022   1. Left ventricular ejection fraction, by estimation, is 50 to 55%. The  left ventricle has low normal function. The left ventricle has no regional  wall motion abnormalities. There is mild left ventricular hypertrophy.  Left ventricular diastolic  parameters are indeterminate.   2. Right ventricular systolic function was not well visualized. The right  ventricular size is not well visualized. There is mildly elevated  pulmonary artery systolic pressure. The estimated right ventricular  systolic pressure is 40.6 mmHg.   3. Left atrial size was moderately dilated.   4. Right atrial size was severely dilated.   5. The mitral  valve is normal in structure. Trivial mitral valve  regurgitation.   6. The aortic valve is tricuspid. Aortic valve regurgitation is trivial.  No aortic stenosis is present.   7. The inferior vena cava is dilated in size with <50% respiratory  variability, suggesting right atrial pressure of 15 mmHg.   8. Large pericardial effusion, primarily localized around LV lateral wall  measuring up to 2.2cm. IVC fixed/dilated, but no RA/RV collapse seen to  suggest tamponade     Patient Profile     87 y.o. male longstanding asymptomatic atrial flutter with spontaneously controlled/slow ventricular response, hypertension, hyperlipidemia, obstructive sleep apnea admitted following a syncopal event, found to have a large pericardial effusion by echo.  Has preserved left ventricular systolic function.  Assessment & Plan    Atrial flutter with slow ventricular sponsor is longstanding.  Dr. Pemberton witnessed conversion to sinus rhythm while he was not on recorded telemetry, but he has only had atrial flutter on the tracings that we can review on telemetry.  He has had a relatively slow heart rate but has not had severe pauses or bradycardia that could explain syncope.  He was hypotensive on arrival suggesting a component of hypovolemia or possible vagal response contributing to his syncopal event.  Currently has a wearable ZIO monitor.  If this does not provide diagnostic information we will plan to follow this with a implantable loop recorder.  If significant pauses or severe bradycardia is demonstrated he should receive a pacemaker.  Undergoing right heart catheterization today for hemodynamic evaluation of the impact of his pericardial effusion.  Clinically he does not appear to have tamponade, and by echo with the only supportive finding is the presence of plethoric IVC.  It is unclear whether the effusion had any contribution to the syncopal event.  Etiology of the pericardial effusion is also unclear.  He  has not had preceding pleuritic chest pain to suggest pericarditis.  No obvious source of potential metastatic disease identified.  His TSH is normal.  The presence of the pericardial effusion does not in and of itself justify pericardiocentesis unless it has hemodynamic impact.  If he does not have evidence of increased filling pressures would recommend serial follow-up with limited echo.  Neck ESR and CRP.  If these are elevated, empirical treatment with anti-inflammatory agents may be beneficial.  Currently on IV heparin, resume his oral anticoagulant if no   invasive procedure is planned after his heart catheterization.  Might be ready for DC later today.     For questions or updates, please contact [facility] Please consult [email or web address] for contact info under        Signed, Shalinda Burkholder, MD  09/11/2022, 11:49 AM    

## 2022-09-11 NOTE — Discharge Summary (Signed)
Physician Discharge Summary   Gary [name]:[id number] DOB: 18-Jul-1935 DOA: 09/07/2022  PCP: Gary Covey, MD  Admit date: 09/07/2022 Discharge date: 09/11/2022  Admitted From: Home Disposition:  Home Discharging physician: Lewie Chamber, MD Barriers to discharge: none  Recommendations at discharge: Follow up with cardiology    Discharge Condition: stable CODE STATUS: Full Diet recommendation:  Diet Orders (From admission, onward)     Start     Ordered   09/11/22 1328  Diet regular Room service appropriate? Yes; Fluid consistency: Thin  Diet effective now       Question Answer Comment  Room service appropriate? Yes   Fluid consistency: Thin      09/11/22 1327   09/11/22 0000  Diet - low sodium heart healthy        09/11/22 1707            Hospital Course: Gary [name] is an 87 y.o. male with history of typical atrial flutter with slow response, hypertension, hyperlipidemia, sleep apnea, gout, rosacea was brought to the ER after patient had a brief episode of loss of consciousness.   Patient stated he was having supper with his friends when he lost consciousness.  It was the 1 year anniversary of his wife passing away 1 year ago.  He did endorse feeling upset to some degree but was not panicking or tearful prior to the event.   He did not have any prodromal symptoms.  Did not have any chest pain or shortness of breath.  He does not exactly remember the duration of his loss of consciousness but he thinks it was not long.  He did not have any focal deficits.  EMS on arrival found patient was bradycardic in the 40s and hypotensive with systolic in the 70s.  Was given 500 cc fluid bolus was brought to the ER.   In the ER patient is found to be in atrial fibrillation rate around 60 bpm. CT head unremarkable.  Labs are largely at baseline. Troponins were negative.  Patient admitted for syncope.  Assessment and Plan: * Syncope-resolved as of 09/11/2022 - Patient was  already sitting down eating when episode happened. He was upset thinking about his wife passing 1 year prior and a vasovagal event is possible but other considered differential includes symptomatic bradycardia given worsened bradycardia from baseline and hypotension with event; large pericardial effusion may also be contributing and/or combo of above  - has followed with cardiology prior for aflutter with slow VR and has been told may need PPM in the future  -TSH normal - cardiology and EP following - continue tele/zio - multiple considered etiologies at this time, see specialists notes - s/p RHC on 5/13: normal LV systolic function and no compromise/tamponade from a pericardial effusion statndpoint - patient cleared for discharge home after discussion with cardiology   Pericardial effusion - Echo notes "large pericardial effusion" -Possible confounding variables presenting with hypotension, bradycardia, syncope - cardiology following, appreciate assistance - IV lasix x 1 - RHC performed on 5/13: normal LV function and no impact from pericardial effusion - he will have repeat echo outpatient for monitoring - follow up ESR/CRP  Typical atrial flutter (HCC) - eliquis resumed at discharge   Macrocytosis - B12 level 385  Gout - Continue allopurinol  Rosacea Continue minocycline  Essential hypertension - Lisinopril resumed at discharge   Obstructive sleep apnea - Continue nightly CPAP  Hypercholesterolemia - Continue simvastatin   The patient's chronic medical conditions were treated accordingly  per the patient's home medication regimen except as noted.  On day of discharge, patient was felt deemed stable for discharge. Patient/family member advised to call PCP or come back to ER if needed.   Principal Diagnosis: Syncope  Discharge Diagnoses: Active Hospital Problems   Diagnosis Date Noted   Pericardial effusion 09/09/2022    Priority: 2.   Typical atrial flutter (HCC)      Priority: 3.   Macrocytosis 09/08/2022   Thrombocytopenia (HCC) 09/08/2022   Gout 02/13/2022   Rosacea 07/09/2018   Essential hypertension 04/05/2016   Obstructive sleep apnea 01/01/2015   Hypercholesterolemia 10/03/2007    Resolved Hospital Problems   Diagnosis Date Noted Date Resolved   Syncope 09/08/2022 09/11/2022    Priority: 1.     Discharge Instructions     Diet - low sodium heart healthy   Complete by: As directed    Increase activity slowly   Complete by: As directed       Allergies as of 09/11/2022   No Known Allergies      Medication List     TAKE these medications    allopurinol 100 MG tablet Commonly known as: ZYLOPRIM TAKE 2 TABLETS EVERY DAY   co-enzyme Q-10 30 MG capsule Take 30 mg by mouth every Monday, Wednesday, and Friday.   Eliquis 5 MG Tabs tablet Generic drug: apixaban TAKE 1 TABLET TWICE DAILY   fluticasone 50 MCG/ACT nasal spray Commonly known as: FLONASE Place 1 spray into both nostrils every other day. What changed:  when to take this additional instructions   lisinopril 10 MG tablet Commonly known as: ZESTRIL Take 1 tablet (10 mg total) by mouth daily.   minocycline 100 MG capsule Commonly known as: MINOCIN Take 100 mg by mouth every Monday, Wednesday, and Friday.   multivitamin tablet Take 1 tablet by mouth daily.   simvastatin 20 MG tablet Commonly known as: ZOCOR TAKE 1 TABLET EVERY OTHER DAY What changed: when to take this   tamsulosin 0.4 MG Caps capsule Commonly known as: FLOMAX Take 1 capsule (0.4 mg total) by mouth daily.        Follow-up Information     Cannon Kettle, PA-C Follow up on 10/04/2022.   Specialty: Internal Medicine Contact information: [address] [place] [address] [phone]                No Known Allergies  Consultations: Cardiology EP  Procedures: 09/10/20: RHC  Discharge Exam: BP (!) 152/78 (BP Location: Left Arm)   Pulse 67   Temp 97.8  F (36.6 C) (Axillary)   Resp 18   Ht 6\' 3"  (1.905 m)   Wt 111.1 kg   SpO2 99%   BMI 30.62 kg/m  Physical Exam Constitutional:      General: He is not in acute distress.    Appearance: Normal appearance.  HENT:     Head: Normocephalic and atraumatic.     Mouth/Throat:     Mouth: Mucous membranes are moist.  Eyes:     Extraocular Movements: Extraocular movements intact.  Cardiovascular:     Rate and Rhythm: Regular rhythm. Bradycardia present.  Pulmonary:     Effort: Pulmonary effort is normal. No respiratory distress.     Breath sounds: Normal breath sounds. No wheezing.  Abdominal:     General: Bowel sounds are normal. There is no distension.     Palpations: Abdomen is soft.     Tenderness: There is no abdominal tenderness.  Musculoskeletal:  General: Normal range of motion.     Cervical back: Normal range of motion and neck supple.  Skin:    General: Skin is warm and dry.  Neurological:     General: No focal deficit present.     Mental Status: He is alert.  Psychiatric:        Mood and Affect: Mood normal.        Behavior: Behavior normal.      The results of significant diagnostics from this hospitalization (including imaging, microbiology, ancillary and laboratory) are listed below for reference.   Microbiology: No results found for this or any previous visit (from the past 240 hour(s)).   Labs: BNP (last 3 results) No results for input(s): "BNP" in the last 8760 hours. Basic Metabolic Panel: Recent Labs  Lab 09/07/22 2304 09/08/22 0547 09/10/22 0548 09/11/22 0246  NA 137 138 138 134*  K 4.7 4.8 4.6 4.4  CL 107 107 109 108  CO2 21* 19* 22 22  GLUCOSE 100* 109* 119* 141*  BUN 45* 46* 47* 42*  CREATININE 1.46* 1.40* 1.35* 1.33*  CALCIUM 8.2* 8.2* 8.5* 8.4*  MG  --   --  1.9 1.9   Liver Function Tests: Recent Labs  Lab 09/10/22 0548  AST 18  ALT 20  ALKPHOS 56  BILITOT 0.8  PROT 5.6*  ALBUMIN 3.0*   No results for input(s): "LIPASE",  "AMYLASE" in the last 168 hours. No results for input(s): "AMMONIA" in the last 168 hours. CBC: Recent Labs  Lab 09/07/22 2304 09/08/22 0547 09/10/22 0548  WBC 7.0 6.1 6.1  NEUTROABS 4.4 3.8  --   HGB 13.3 12.5* 13.3  HCT 40.6 36.3* 39.9  MCV 102.8* 98.6 98.5  PLT 140* 133* 128*   Cardiac Enzymes: No results for input(s): "CKTOTAL", "CKMB", "CKMBINDEX", "TROPONINI" in the last 168 hours. BNP: Invalid input(s): "POCBNP" CBG: No results for input(s): "GLUCAP" in the last 168 hours. D-Dimer No results for input(s): "DDIMER" in the last 72 hours. Hgb A1c No results for input(s): "HGBA1C" in the last 72 hours. Lipid Profile No results for input(s): "CHOL", "HDL", "LDLCALC", "TRIG", "CHOLHDL", "LDLDIRECT" in the last 72 hours. Thyroid function studies No results for input(s): "TSH", "T4TOTAL", "T3FREE", "THYROIDAB" in the last 72 hours.  Invalid input(s): "FREET3" Anemia work up No results for input(s): "VITAMINB12", "FOLATE", "FERRITIN", "TIBC", "IRON", "RETICCTPCT" in the last 72 hours. Urinalysis    Component Value Date/Time   COLORURINE YELLOW 09/08/2022 0203   APPEARANCEUR CLEAR 09/08/2022 0203   LABSPEC 1.005 09/08/2022 0203   PHURINE 5.0 09/08/2022 0203   GLUCOSEU NEGATIVE 09/08/2022 0203   HGBUR NEGATIVE 09/08/2022 0203   HGBUR negative 10/19/2009 0000   BILIRUBINUR NEGATIVE 09/08/2022 0203   BILIRUBINUR n 01/10/2019 0944   KETONESUR NEGATIVE 09/08/2022 0203   PROTEINUR 30 (A) 09/08/2022 0203   UROBILINOGEN 0.2 01/10/2019 0944   UROBILINOGEN 0.2 10/19/2009 0000   NITRITE NEGATIVE 09/08/2022 0203   LEUKOCYTESUR NEGATIVE 09/08/2022 0203   Sepsis Labs Recent Labs  Lab 09/07/22 2304 09/08/22 0547 09/10/22 0548  WBC 7.0 6.1 6.1   Microbiology No results found for this or any previous visit (from the past 240 hour(s)).  Procedures/Studies: CARDIAC CATHETERIZATION  Result Date: 09/11/2022 Images from the original result were not included. LEANDREW GRAMS  is a 87 y.o. male  [id number] LOCATION:  FACILITY: [facility] PHYSICIAN: Nanetta Batty, M.D. 1935-12-14 DATE OF PROCEDURE:  09/11/2022 DATE OF DISCHARGE: Right heart cath History obtained from chart review.87 y.o. male longstanding asymptomatic  atrial flutter with spontaneously controlled/slow ventricular response, hypertension, hyperlipidemia, obstructive sleep apnea admitted following a syncopal event, found to have a large pericardial effusion by echo.  Has preserved left ventricular systolic function.  Right heart cath was ordered to assess his hemodynamics. HEMODYNAMICS: 1: Right atrial pressure-9/10, mean 8 2: Right ventricular pressure-45/2 3: Pulmonary pressure-47/9, mean 23 4: Pulmonary wedge pressure-A-wave 23, V wave 23, mean 19 5: Cardiac output-6.3 L/min with an index of 2.6 L/min/m    Mr. Donata Duff has a relatively low right atrial pressure with mild pulmonary hypertension and slightly high filling pressures with preserved cardiac output.  I do not think his hemodynamic support a diagnosis of pericardial tamponade.  The right heart cath was removed as was the sheath and a pressure dressing was placed on the right antecubital vein.  The patient left lab stable condition.  Heparin orders per Dr. Royann Shivers. Nanetta Batty. MD, [facility] 09/11/2022 1:03 PM    ECHOCARDIOGRAM COMPLETE  Result Date: 09/08/2022    ECHOCARDIOGRAM REPORT   Patient Name:   RHET BERNOSKY Date of Exam: 09/08/2022 Medical Rec #:  [id number]       Height:       75.0 in Accession #:    [id number]      Weight:       245.0 lb Date of Birth:  1935/10/16      BSA:          2.392 m Patient Age:    86 years        BP:           148/79 mmHg Patient Gender: M               HR:           58 bpm. Exam Location:  Inpatient Procedure: 2D Echo, Cardiac Doppler, Color Doppler and Intracardiac            Opacification Agent Indications:    Syncope R55  History:        Patient has no prior history of Echocardiogram examinations.                 Risk  Factors:Hypertension, Dyslipidemia and Former Smoker.  Sonographer:    Dondra Prader RVT RCS Referring Phys: [id number] Meryle Ready [facility]  Sonographer Comments: Suboptimal apical window. Image acquisition challenging due to respiratory motion. IMPRESSIONS  1. Left ventricular ejection fraction, by estimation, is 50 to 55%. The left ventricle has low normal function. The left ventricle has no regional wall motion abnormalities. There is mild left ventricular hypertrophy. Left ventricular diastolic parameters are indeterminate.  2. Right ventricular systolic function was not well visualized. The right ventricular size is not well visualized. There is mildly elevated pulmonary artery systolic pressure. The estimated right ventricular systolic pressure is 40.6 mmHg.  3. Left atrial size was moderately dilated.  4. Right atrial size was severely dilated.  5. The mitral valve is normal in structure. Trivial mitral valve regurgitation.  6. The aortic valve is tricuspid. Aortic valve regurgitation is trivial. No aortic stenosis is present.  7. The inferior vena cava is dilated in size with <50% respiratory variability, suggesting right atrial pressure of 15 mmHg.  8. Large pericardial effusion, primarily localized around LV lateral wall measuring up to 2.2cm. IVC fixed/dilated, but no RA/RV collapse seen to suggest tamponade FINDINGS  Left Ventricle: Left ventricular ejection fraction, by estimation, is 50 to 55%. The left ventricle has low normal function. The left ventricle has no regional  wall motion abnormalities. Definity contrast agent was given IV to delineate the left ventricular endocardial borders. The left ventricular internal cavity size was normal in size. There is mild left ventricular hypertrophy. Left ventricular diastolic parameters are indeterminate. Right Ventricle: The right ventricular size is not well visualized. Right vetricular wall thickness was not well visualized. Right ventricular systolic function was  not well visualized. There is mildly elevated pulmonary artery systolic pressure. The tricuspid regurgitant velocity is 2.53 m/s, and with an assumed right atrial pressure of 15 mmHg, the estimated right ventricular systolic pressure is 40.6 mmHg. Left Atrium: Left atrial size was moderately dilated. Right Atrium: Right atrial size was severely dilated. Pericardium: A large pericardial effusion is present. Mitral Valve: The mitral valve is normal in structure. Trivial mitral valve regurgitation. Tricuspid Valve: The tricuspid valve is normal in structure. Tricuspid valve regurgitation is mild. Aortic Valve: The aortic valve is tricuspid. Aortic valve regurgitation is trivial. No aortic stenosis is present. Aortic valve mean gradient measures 2.7 mmHg. Aortic valve peak gradient measures 4.6 mmHg. Aortic valve area, by VTI measures 2.78 cm. Pulmonic Valve: The pulmonic valve was not well visualized. Pulmonic valve regurgitation is not visualized. Aorta: The aortic root and ascending aorta are structurally normal, with no evidence of dilitation. Venous: The inferior vena cava is dilated in size with less than 50% respiratory variability, suggesting right atrial pressure of 15 mmHg. IAS/Shunts: The interatrial septum was not well visualized.  LEFT VENTRICLE PLAX 2D LVIDd:         4.70 cm LVIDs:         3.60 cm LV PW:         1.40 cm LV IVS:        1.40 cm LVOT diam:     2.30 cm LV SV:         65 LV SV Index:   27 LVOT Area:     4.15 cm  RIGHT VENTRICLE          IVC RV Basal diam:  4.30 cm  IVC diam: 3.00 cm TAPSE (M-mode): 2.2 cm LEFT ATRIUM              Index        RIGHT ATRIUM           Index LA diam:        4.70 cm  1.96 cm/m   RA Area:     33.30 cm LA Vol (A2C):   101.0 ml 42.22 ml/m  RA Volume:   125.00 ml 52.26 ml/m LA Vol (A4C):   121.0 ml 50.58 ml/m LA Biplane Vol: 111.0 ml 46.40 ml/m  AORTIC VALVE                    PULMONIC VALVE AV Area (Vmax):    3.42 cm     PV Vmax:       0.72 m/s AV Area  (Vmean):   3.40 cm     PV Peak grad:  2.1 mmHg AV Area (VTI):     2.78 cm AV Vmax:           107.43 cm/s AV Vmean:          76.300 cm/s AV VTI:            0.234 m AV Peak Grad:      4.6 mmHg AV Mean Grad:      2.7 mmHg LVOT Vmax:         88.35 cm/s  LVOT Vmean:        62.375 cm/s LVOT VTI:          0.156 m LVOT/AV VTI ratio: 0.67 AR Vena Contracta: 0.40 cm  AORTA Ao Root diam: 3.50 cm Ao Asc diam:  3.70 cm MITRAL VALVE               TRICUSPID VALVE MV Area (PHT): 4.06 cm    TR Peak grad:   25.6 mmHg MV Decel Time: 187 msec    TR Vmax:        253.00 cm/s MV E velocity: 90.63 cm/s MV A velocity: 61.57 cm/s  SHUNTS MV E/A ratio:  1.47        Systemic VTI:  0.16 m                            Systemic Diam: 2.30 cm Epifanio Lesches MD Electronically signed by Epifanio Lesches MD Signature Date/Time: 09/08/2022/4:52:33 PM    Final    CT Head Wo Contrast  Result Date: 09/07/2022 CLINICAL DATA:  Recent syncopal episode EXAM: CT HEAD WITHOUT CONTRAST TECHNIQUE: Contiguous axial images were obtained from the base of the skull through the vertex without intravenous contrast. RADIATION DOSE REDUCTION: This exam was performed according to the departmental dose-optimization program which includes automated exposure control, adjustment of the mA and/or kV according to patient size and/or use of iterative reconstruction technique. COMPARISON:  None Available. FINDINGS: Brain: No evidence of acute infarction, hemorrhage, hydrocephalus, extra-axial collection or mass lesion/mass effect. Mild atrophic changes are noted. Vascular: No hyperdense vessel or unexpected calcification. Skull: Normal. Negative for fracture or focal lesion. Sinuses/Orbits: Orbits and their contents are within normal limits. Paranasal sinuses demonstrate diffuse mucosal thickening within the left maxillary, ethmoid and frontal sinuses. Other: None. IMPRESSION: Atrophic changes without acute intracranial abnormality. Mucosal thickening within the  paranasal sinuses. Electronically Signed   By: Alcide Clever M.D.   On: 09/07/2022 23:33   DG Chest Portable 1 View  Result Date: 09/07/2022 CLINICAL DATA:  Chest pain. EXAM: PORTABLE CHEST 1 VIEW COMPARISON:  February 27, 2021 FINDINGS: There is mild to moderate severity enlargement of the cardiac silhouette. Moderate to marked severity calcification of the aortic arch is seen. Mild atelectasis is seen within the left lung base. There is no evidence of a pleural effusion or pneumothorax. The visualized skeletal structures are unremarkable. IMPRESSION: Stable cardiomegaly with mild left basilar atelectasis. Electronically Signed   By: Aram Candela M.D.   On: 09/07/2022 23:21     Time coordinating discharge: Over 30 minutes    Lewie Chamber, MD  [facility] 09/11/2022, 5:11 PM

## 2022-09-11 NOTE — Interval H&P Note (Signed)
History and Physical Interval Note:  09/11/2022 12:23 PM  Gary [name]  has presented today for surgery, with the diagnosis of HF.  The various methods of treatment have been discussed with the patient and family. After consideration of risks, benefits and other options for treatment, the patient has consented to  Procedure(s): RIGHT HEART CATH (N/A) as a surgical intervention.  The patient's history has been reviewed, patient examined, no change in status, stable for surgery.  I have reviewed the patient's chart and labs.  Questions were answered to the patient's satisfaction.     Nanetta Batty

## 2022-09-11 NOTE — H&P (View-Only) (Signed)
Rounding Note    Patient Name: Gary [name] Date of Encounter: 09/11/2022  [facility] Cardiologist: Thurmon Fair, MD   Subjective   No episodes of syncope or near syncope. Rhythm remains atrial flutter with slow ventricular response, heart rate dipping down into the high 30s at night, typically in the 50s during the day.  No significant pauses seen. Echo shows a large pericardial effusion without evidence of overt tamponade  Inpatient Medications    Scheduled Meds:  allopurinol  200 mg Oral Daily   minocycline  100 mg Oral Q M,W,F   simvastatin  20 mg Oral Q M,W,F   sodium chloride flush  3 mL Intravenous Q12H   tamsulosin  0.4 mg Oral Daily   Continuous Infusions:  sodium chloride     sodium chloride 10 mL/hr at 09/11/22 0911   heparin 1,550 Units/hr (09/11/22 0600)   PRN Meds: sodium chloride, hydrALAZINE, sodium chloride flush   Vital Signs    Vitals:   09/10/22 0422 09/10/22 1511 09/10/22 2021 09/11/22 0422  BP: (!) 153/82 (!) 152/90 (!) 147/69 139/70  Pulse: 63     Resp:  17    Temp: 97.8 F (36.6 C) 97.9 F (36.6 C) 98.2 F (36.8 C) 97.7 F (36.5 C)  TempSrc: Oral Oral Oral Oral  SpO2: 97%  98% 97%  Weight:      Height:        Intake/Output Summary (Last 24 hours) at 09/11/2022 1149 Last data filed at 09/11/2022 0600 Gross per 24 hour  Intake 786.16 ml  Output 1600 ml  Net -813.84 ml      09/07/2022   11:07 PM 06/05/2022   11:48 AM 05/26/2022   11:07 AM  Last 3 Weights  Weight (lbs) 245 lb 249 lb 9.6 oz 246 lb  Weight (kg) 111.131 kg 113.218 kg 111.585 kg      Telemetry    Atrial flutter with variable AV block- Personally Reviewed  ECG    No new tracing- Personally Reviewed  Physical Exam  Comfortable lying flat in bed GEN: No acute distress.   Neck: No JVD Cardiac: Irregular, no murmurs, rubs, or gallops.  Respiratory: Clear to auscultation bilaterally. GI: Soft, nontender, non-distended  MS: No edema; No  deformity. Neuro:  Nonfocal other than mild hard of hearing Psych: Normal affect   Labs    High Sensitivity Troponin:   Recent Labs  Lab 09/07/22 2304 09/08/22 0111  TROPONINIHS 16 16     Chemistry Recent Labs  Lab 09/08/22 0547 09/10/22 0548 09/11/22 0246  NA 138 138 134*  K 4.8 4.6 4.4  CL 107 109 108  CO2 19* 22 22  GLUCOSE 109* 119* 141*  BUN 46* 47* 42*  CREATININE 1.40* 1.35* 1.33*  CALCIUM 8.2* 8.5* 8.4*  MG  --  1.9 1.9  PROT  --  5.6*  --   ALBUMIN  --  3.0*  --   AST  --  18  --   ALT  --  20  --   ALKPHOS  --  56  --   BILITOT  --  0.8  --   GFRNONAA 50* 52* 53*  ANIONGAP 12 7 4*    Lipids No results for input(s): "CHOL", "TRIG", "HDL", "LABVLDL", "LDLCALC", "CHOLHDL" in the last 168 hours.  Hematology Recent Labs  Lab 09/07/22 2304 09/08/22 0547 09/10/22 0548  WBC 7.0 6.1 6.1  RBC 3.95* 3.68* 4.05*  HGB 13.3 12.5* 13.3  HCT 40.6 36.3*  39.9  MCV 102.8* 98.6 98.5  MCH 33.7 34.0 32.8  MCHC 32.8 34.4 33.3  RDW 13.3 13.3 13.4  PLT 140* 133* 128*   Thyroid  Recent Labs  Lab 09/08/22 0547  TSH 3.626    BNPNo results for input(s): "BNP", "PROBNP" in the last 168 hours.  DDimer No results for input(s): "DDIMER" in the last 168 hours.   Radiology    No results found.  Cardiac Studies   Echocardiogram 09/08/2022   1. Left ventricular ejection fraction, by estimation, is 50 to 55%. The  left ventricle has low normal function. The left ventricle has no regional  wall motion abnormalities. There is mild left ventricular hypertrophy.  Left ventricular diastolic  parameters are indeterminate.   2. Right ventricular systolic function was not well visualized. The right  ventricular size is not well visualized. There is mildly elevated  pulmonary artery systolic pressure. The estimated right ventricular  systolic pressure is 40.6 mmHg.   3. Left atrial size was moderately dilated.   4. Right atrial size was severely dilated.   5. The mitral  valve is normal in structure. Trivial mitral valve  regurgitation.   6. The aortic valve is tricuspid. Aortic valve regurgitation is trivial.  No aortic stenosis is present.   7. The inferior vena cava is dilated in size with <50% respiratory  variability, suggesting right atrial pressure of 15 mmHg.   8. Large pericardial effusion, primarily localized around LV lateral wall  measuring up to 2.2cm. IVC fixed/dilated, but no RA/RV collapse seen to  suggest tamponade     Patient Profile     87 y.o. male longstanding asymptomatic atrial flutter with spontaneously controlled/slow ventricular response, hypertension, hyperlipidemia, obstructive sleep apnea admitted following a syncopal event, found to have a large pericardial effusion by echo.  Has preserved left ventricular systolic function.  Assessment & Plan    Atrial flutter with slow ventricular sponsor is longstanding.  Dr. Shari Prows witnessed conversion to sinus rhythm while he was not on recorded telemetry, but he has only had atrial flutter on the tracings that we can review on telemetry.  He has had a relatively slow heart rate but has not had severe pauses or bradycardia that could explain syncope.  He was hypotensive on arrival suggesting a component of hypovolemia or possible vagal response contributing to his syncopal event.  Currently has a wearable ZIO monitor.  If this does not provide diagnostic information we will plan to follow this with a implantable loop recorder.  If significant pauses or severe bradycardia is demonstrated he should receive a pacemaker.  Undergoing right heart catheterization today for hemodynamic evaluation of the impact of his pericardial effusion.  Clinically he does not appear to have tamponade, and by echo with the only supportive finding is the presence of plethoric IVC.  It is unclear whether the effusion had any contribution to the syncopal event.  Etiology of the pericardial effusion is also unclear.  He  has not had preceding pleuritic chest pain to suggest pericarditis.  No obvious source of potential metastatic disease identified.  His TSH is normal.  The presence of the pericardial effusion does not in and of itself justify pericardiocentesis unless it has hemodynamic impact.  If he does not have evidence of increased filling pressures would recommend serial follow-up with limited echo.  Neck ESR and CRP.  If these are elevated, empirical treatment with anti-inflammatory agents may be beneficial.  Currently on IV heparin, resume his oral anticoagulant if no  invasive procedure is planned after his heart catheterization.  Might be ready for DC later today.     For questions or updates, please contact [facility] Please consult [email or web address] for contact info under        Signed, Thurmon Fair, MD  09/11/2022, 11:49 AM

## 2022-09-11 NOTE — Care Management Important Message (Signed)
Important Message  Patient Details  Name: Gary [name] MRN: [id number] Date of Birth: 02/10/1936   Medicare Important Message Given:  Yes     Renie Ora 09/11/2022, 11:20 AM

## 2022-09-11 NOTE — Progress Notes (Signed)
Progress Note    Gary [name]   ZOX:[id number]  DOB: 25-Dec-1935  DOA: 09/07/2022     3 PCP: Kristian Covey, MD  Initial CC: syncope  Hospital Course: Gary [name] is an 87 y.o. male with history of typical atrial flutter with slow response, hypertension, hyperlipidemia, sleep apnea, gout, rosacea was brought to the ER after patient had a brief episode of loss of consciousness.   Patient stated he was having supper with his friends when he lost consciousness.  It was the 1 year anniversary of his wife passing away 1 year ago.  He did endorse feeling upset to some degree but was not panicking or tearful prior to the event.   He did not have any prodromal symptoms.  Did not have any chest pain or shortness of breath.  He does not exactly remember the duration of his loss of consciousness but he thinks it was not long.  He did not have any focal deficits.  EMS on arrival found patient was bradycardic in the 40s and hypotensive with systolic in the 70s.  Was given 500 cc fluid bolus was brought to the ER.   In the ER patient is found to be in atrial fibrillation rate around 60 bpm. CT head unremarkable.  Labs are largely at baseline. Troponins were negative.  Patient admitted for syncope.  Interval History:  No events overnight. Remains bradycardic but asymptomatic. Planning for RHC today. No questions this morning.   Assessment and Plan: * Syncope - Patient was already sitting down eating when episode happened. He was upset thinking about his wife passing 1 year prior and a vasovagal event is possible but other considered differential includes symptomatic bradycardia given worsened bradycardia from baseline and hypotension with event; large pericardial effusion may also be contributing and/or combo of above  - has followed with cardiology prior for aflutter with slow VR and has been told may need PPM in the future  - continue on tele -TSH normal - cardiology and EP following -  continue tele/zio - multiple considered etiologies at this time, see specialists notes; next step is pursuing RHC - allow BP to run on higher end in setting of hypotension on admission   Pericardial effusion - Echo notes "large pericardial effusion" -Possible confounding variables presenting with hypotension, bradycardia, syncope - cardiology following, appreciate assistance - IV lasix x 1  Typical atrial flutter (HCC) - Eliquis on hold in case of procedure - heparin bridge for now  Macrocytosis - B12 level 385  Gout - Continue allopurinol  Rosacea Continue minocycline  Essential hypertension - Lisinopril on hold in setting of soft blood pressure on admission  Obstructive sleep apnea - Continue nightly CPAP  Hypercholesterolemia - Continue simvastatin   Old records reviewed in assessment of this patient  Antimicrobials:   DVT prophylaxis:   apixaban (ELIQUIS) tablet 5 mg Heparin drip  Code Status:   Code Status: Full Code  Mobility Assessment (last 72 hours)     Mobility Assessment     Row Name 09/10/22 2100 09/09/22 2100 09/09/22 0755       Does patient have an order for bedrest or is patient medically unstable No - Continue assessment No - Continue assessment No - Continue assessment     What is the highest level of mobility based on the progressive mobility assessment? Level 6 (Walks independently in room and hall) - Balance while walking in room without assist - Complete Level 6 (Walks independently in room and hall) -  Balance while walking in room without assist - Complete Level 5 (Walks with assist in room/hall) - Balance while stepping forward/back and can walk in room with assist - Complete              Barriers to discharge: none Disposition Plan:  Home Status is: Inpt  Objective: Blood pressure (!) 165/76, pulse 65, temperature 97.7 F (36.5 C), temperature source Oral, resp. rate 18, height 6\' 3"  (1.905 m), weight 111.1 kg, SpO2 95 %.   Examination:  Physical Exam Constitutional:      General: He is not in acute distress.    Appearance: Normal appearance.  HENT:     Head: Normocephalic and atraumatic.     Mouth/Throat:     Mouth: Mucous membranes are moist.  Eyes:     Extraocular Movements: Extraocular movements intact.  Cardiovascular:     Rate and Rhythm: Regular rhythm. Bradycardia present.  Pulmonary:     Effort: Pulmonary effort is normal. No respiratory distress.     Breath sounds: Normal breath sounds. No wheezing.  Abdominal:     General: Bowel sounds are normal. There is no distension.     Palpations: Abdomen is soft.     Tenderness: There is no abdominal tenderness.  Musculoskeletal:        General: Normal range of motion.     Cervical back: Normal range of motion and neck supple.  Skin:    General: Skin is warm and dry.  Neurological:     General: No focal deficit present.     Mental Status: He is alert.  Psychiatric:        Mood and Affect: Mood normal.        Behavior: Behavior normal.      Consultants:  Cardiology  Procedures:    Data Reviewed: Results for orders placed or performed during the hospital encounter of 09/07/22 (from the past 24 hour(s))  APTT     Status: Abnormal   Collection Time: 09/10/22  2:48 PM  Result Value Ref Range   aPTT 62 (H) 24 - 36 seconds  Sedimentation rate     Status: None   Collection Time: 09/10/22  2:48 PM  Result Value Ref Range   Sed Rate 8 0 - 16 mm/hr  Magnesium     Status: None   Collection Time: 09/11/22  2:46 AM  Result Value Ref Range   Magnesium 1.9 1.7 - 2.4 mg/dL  Heparin level (unfractionated)     Status: None   Collection Time: 09/11/22  2:46 AM  Result Value Ref Range   Heparin Unfractionated 0.68 0.30 - 0.70 IU/mL  APTT     Status: Abnormal   Collection Time: 09/11/22  2:46 AM  Result Value Ref Range   aPTT 76 (H) 24 - 36 seconds  Basic metabolic panel     Status: Abnormal   Collection Time: 09/11/22  2:46 AM  Result Value  Ref Range   Sodium 134 (L) 135 - 145 mmol/L   Potassium 4.4 3.5 - 5.1 mmol/L   Chloride 108 98 - 111 mmol/L   CO2 22 22 - 32 mmol/L   Glucose, Bld 141 (H) 70 - 99 mg/dL   BUN 42 (H) 8 - 23 mg/dL   Creatinine, Ser 1.61 (H) 0.61 - 1.24 mg/dL   Calcium 8.4 (L) 8.9 - 10.3 mg/dL   GFR, Estimated 53 (L) >60 mL/min   Anion gap 4 (L) 5 - 15     I have reviewed  pertinent nursing notes, vitals, labs, and images as necessary. I have ordered labwork to follow up on as indicated.  I have reviewed the last notes from staff over past 24 hours. I have discussed patient's care plan and test results with nursing staff, CM/SW, and other staff as appropriate.    LOS: 3 days   Lewie Chamber, MD [facility] 09/11/2022, 1:44 PM

## 2022-09-11 NOTE — Progress Notes (Signed)
Telemetry reviewed, AFlutter w/CVR 40's-50's overnight, 50's-60's day time  one 5 beat NSVT  Review of chart, pt with Zio AT Large pericardial effusion Planned for RHC EP will be available We can plan out patient loop, +/- PPM should the need become clear while here.  Please recall if needed Francis Dowse, PA-C

## 2022-09-12 ENCOUNTER — Telehealth: Payer: Self-pay

## 2022-09-12 ENCOUNTER — Encounter (HOSPITAL_COMMUNITY): Payer: Self-pay | Admitting: Cardiovascular Disease

## 2022-09-12 ENCOUNTER — Ambulatory Visit: Payer: Self-pay

## 2022-09-12 LAB — ANA W/REFLEX IF POSITIVE: Anti Nuclear Antibody (ANA): NEGATIVE

## 2022-09-12 NOTE — Transitions of Care (Post Inpatient/ED Visit) (Signed)
09/12/2022  Name: Gary [name] MRN: [id number] DOB: [birth year]  Today's TOC FU Call Status: Today's TOC FU Call Status:: Successful TOC FU Call Competed TOC FU Call Complete Date: 09/12/22  Transition Care Management Follow-up Telephone Call Date of Discharge: 09/11/22 Discharge Facility: [facility]) Type of Discharge: Inpatient Admission Primary Inpatient Discharge Diagnosis:: "syncope and collapse" How have you been since you were released from the hospital?: Better (Pt states he rested well last night-has had no issues since returning home. He has been up moving around-no further syncope. Appetite good. LBM was yesterday.) Any questions or concerns?: No  Items Reviewed: Did you receive and understand the discharge instructions provided?: Yes Medications obtained,verified, and reconciled?: Yes (Medications Reviewed) Any new allergies since your discharge?: No Dietary orders reviewed?: Yes Type of Diet Ordered:: low salt/heart healthy Do you have support at home?: Yes Name of Support/Comfort Primary Source: pt states son lives nearby and helps him out as needed  Medications Reviewed Today: Medications Reviewed Today     Reviewed by Charlyn Minerva, RN (Registered Nurse) on 09/12/22 at 1302  Med List Status: <None>   Medication Order Taking? Sig Documenting Provider Last Dose Status Informant  allopurinol (ZYLOPRIM) 100 MG tablet [id number] Yes TAKE 2 TABLETS EVERY DAY Burchette, Elberta Fortis, MD Taking Active Self  co-enzyme Q-10 30 MG capsule [id number] Yes Take 30 mg by mouth every Monday, Wednesday, and Friday. [provider] Taking Active Self  ELIQUIS 5 MG TABS tablet [id number] Yes TAKE 1 TABLET TWICE DAILY Croitoru, Mihai, MD Taking Active Self  fluticasone (FLONASE) 50 MCG/ACT nasal spray [id number] Yes Place 1 spray into both nostrils every other day.  Patient taking differently: Place 1 spray into both nostrils every Monday, Wednesday, and Friday.  Nightly   Burchette, Elberta Fortis, MD Taking Active Self  lisinopril (ZESTRIL) 10 MG tablet [id number] Yes Take 1 tablet (10 mg total) by mouth daily. Croitoru, Mihai, MD Taking Active Self           Med Note Barnie Mort Jul 03, 2022 11:24 AM) Taking differently: 2 tabs daily  minocycline (MINOCIN,DYNACIN) 100 MG capsule [id number] Yes Take 100 mg by mouth every Monday, Wednesday, and Friday. [provider] Taking Active Self           Med Note (WHITE, Elvin So   Fri Sep 08, 2022  1:46 AM) Continuous therapy for rosacea  Multiple Vitamin (MULTIVITAMIN) tablet [id number] Yes Take 1 tablet by mouth daily. [provider] Taking Active Self  simvastatin (ZOCOR) 20 MG tablet [id number] Yes TAKE 1 TABLET EVERY OTHER DAY  Patient taking differently: Take 20 mg by mouth every Monday, Wednesday, and Friday.   Wynn Banker, MD Taking Active Self  tamsulosin (FLOMAX) 0.4 MG CAPS capsule [id number] Yes Take 1 capsule (0.4 mg total) by mouth daily. Kristian Covey, MD Taking Active Self            Home Care and Equipment/Supplies: Were Home Health Services Ordered?: NA Any new equipment or medical supplies ordered?: NA  Functional Questionnaire: Do you need assistance with meal preparation?: No Do you need assistance with eating?: No Do you have difficulty maintaining continence: No Do you need assistance with getting out of bed/getting out of a chair/moving?: No Do you have difficulty managing or taking your medications?: No  Follow up appointments reviewed: PCP Follow-up appointment confirmed?: Yes Date of PCP follow-up appointment?: 10/03/22 (pt w/ PCP appt on 10/03/22 already scheduled prior to  admisison-he declined making sooner hosp f/u appt as provider out of office until June and doesn't want to see anyone else-prefers to keep that appt-care guide changed appt type to hosp f/u for 10/03/22) Follow-up Provider: Dr. Caryl Never Specialist [facility] Follow-up  appointment confirmed?: Yes Date of Specialist follow-up appointment?: 10/04/22 Follow-Up Specialty Provider:: Philipp Ovens Do you need transportation to your follow-up appointment?: No (pt states his son will drive him to appts until he is cleared to resume driving) Do you understand care options if your condition(s) worsen?: Yes-patient verbalized understanding  SDOH Interventions Today    Flowsheet Row Most Recent Value  SDOH Interventions   Transportation Interventions Intervention Not Indicated      TOC Interventions Today    Flowsheet Row Most Recent Value  TOC Interventions   TOC Interventions Discussed/Reviewed TOC Interventions Discussed, Post discharge activity limitations per provider      Interventions Today    Flowsheet Row Most Recent Value  General Interventions   General Interventions Discussed/Reviewed General Interventions Discussed, Doctor Visits  Doctor Visits Discussed/Reviewed Doctor Visits Discussed, PCP, Specialist  PCP/Specialist Visits Compliance with follow-up visit  Education Interventions   Education Provided Provided Education  Provided Verbal Education On Nutrition, When to see the doctor, Medication  Nutrition Interventions   Nutrition Discussed/Reviewed Nutrition Discussed, Decreasing fats, Adding fruits and vegetables, Decreasing salt  Pharmacy Interventions   Pharmacy Dicussed/Reviewed Pharmacy Topics Discussed, Medications and their functions  Safety Interventions   Safety Discussed/Reviewed Safety Discussed  [pt states he is able to ambulate on his own without any DME or assistance]       Antionette Fairy, RN,BSN,CCM [facility] Care Management Care Management Community Coordinator Direct Phone: [phone] Toll Free: [phone] Fax: [phone]

## 2022-09-12 NOTE — Chronic Care Management (AMB) (Signed)
   09/12/2022  Gary [name] 1935/11/30 [id number]   Reason for Encounter: Patient is not currently enrolled in the CCM program. CCM status changed to previously enrolled  Alto Denver RN, MSN, CCM RN Care Manager  Chronic Care Management Direct Number: [phone]

## 2022-09-17 ENCOUNTER — Telehealth: Payer: Self-pay | Admitting: Student

## 2022-09-17 NOTE — Telephone Encounter (Signed)
   Received page from [facility] about an abnormal EKG. Called and spoke with rep. Patient was in slow atrial flutter this morning around 7:15am with average heart rate of 40 bpm. This was an automatic trigger. Reviewed chart. Patient was recently admitted to the hospital after a syncopal episode. He was noted to be in slow atrial flutter during admission with rates in the 40s to 50s overnight and in the 50-60s during the day. He is not on any AV nodal agents. Monitor was placed in the hospital. Called and spoke with patient. He states he is completely asymptomatic this morning. He states he is on his way to church and is feeling fine. No lightheadedness, dizziness, near syncope, chest pain, or shortness of breath. He has not had any recurrent syncope since discharge from the hospital. No acute intervention needed at this time. Can wait for full monitor results and then go from there. Advised patient to let us know if he develops any lightheadedness, dizziness, or near syncope/ syncope in the meantime.  Corrin Parker, PA-C 09/17/2022 9:50 AM

## 2022-09-20 ENCOUNTER — Other Ambulatory Visit: Payer: Self-pay

## 2022-09-21 MED ORDER — SIMVASTATIN 20 MG PO TABS: 20.0000 mg | ORAL_TABLET | ORAL | 3 refills | Status: DC

## 2022-10-03 ENCOUNTER — Encounter: Payer: Self-pay | Admitting: Family Medicine

## 2022-10-03 ENCOUNTER — Ambulatory Visit (INDEPENDENT_AMBULATORY_CARE_PROVIDER_SITE_OTHER): Payer: Medicare HMO | Admitting: Family Medicine

## 2022-10-03 VITALS — BP 130/60 | HR 49 | Temp 98.1°F | Ht 75.0 in | Wt 253.3 lb

## 2022-10-03 DIAGNOSIS — D7589 Other specified diseases of blood and blood-forming organs: Secondary | ICD-10-CM | POA: Diagnosis not present

## 2022-10-03 DIAGNOSIS — R7301 Impaired fasting glucose: Secondary | ICD-10-CM

## 2022-10-03 DIAGNOSIS — I10 Essential (primary) hypertension: Secondary | ICD-10-CM | POA: Diagnosis not present

## 2022-10-03 DIAGNOSIS — R55 Syncope and collapse: Secondary | ICD-10-CM

## 2022-10-03 DIAGNOSIS — I3139 Other pericardial effusion (noninflammatory): Secondary | ICD-10-CM | POA: Diagnosis not present

## 2022-10-03 DIAGNOSIS — I483 Typical atrial flutter: Secondary | ICD-10-CM

## 2022-10-03 NOTE — Progress Notes (Unsigned)
Cardiology Office Note:    Date:  10/05/2022   ID:  Gary [name], DOB 1935-08-28, MRN [id number]  PCP:  Kristian Covey, MD [facility] Cardiologist: Thurmon Fair, MD   Reason for visit: Follow-up post syncope  History of Present Illness:    Gary [name] is a 87 y.o. male with a hx of atrial flutter with intermittent low ventricular rate, sleep apnea on CPAP, hyperlipidemia, hypertension.  He last saw Dr. Royann Shivers in February 2024.  He had borderline blood pressure.  Amlodipine was stopped and lisinopril was decreased.  He was admitted to the hospital May 9 to Sep 11, 2022 for with brief episode of syncope.  He was having supportive friends when he lost consciousness.  He denied prodromal symptoms.  On EMS arrival, he was bradycardic in the 40s and hypotensive with systolics in the 70s.  In the ER he had A-fib with heart rate 60.  CT of the head unremarkable.  Echo showed large pericardial effusion.  RHC on 5/13: normal LV systolic function and no compromise/tamponade.    Today, patient states he is doing well.  He discusses how he had abrupt episode of syncope while having dinner with friends on his wife's 1 year anniversary of passing away.  He does not remember being overwhelmed with emotion at the time.  He states this was a first-time episode and has not had recurrence.  Patient has occasional lightheadedness/dizziness when standing up quickly or going up steps -this is very brief and stable.  He works out with a Psychologist, educational twice per week and does yard work.  He denies chest pain, shortness of breath, PND, orthopnea and lower extremity edema.  He denies bleeding issues with Eliquis.  He denies history of palpitations.  Dr. Royann Shivers has mentioned to him before but that he may eventually need a pacemaker.    Past Medical History:  Diagnosis Date   Adenomatous colon polyp 06/2000   ALLERGIC RHINITIS 10/03/2007   Allergy    seasonal   Arthritis    knees   Atrial flutter  (HCC) 06/26/2014   Cancer (HCC)    skin cancers removed in the past basal cell and squamous cell   CARDIAC MURMUR, AORTIC 10/03/2007   ERECTILE DYSFUNCTION 10/03/2007   High blood pressure 03/2016   HYPERLIPIDEMIA 10/03/2007   Rosacea    Sleep apnea     Past Surgical History:  Procedure Laterality Date   ADENOIDECTOMY     CARDIOVERSION N/A 07/01/2014   Procedure: CARDIOVERSION;  Surgeon: Thurmon Fair, MD;  Location: [facility] ENDOSCOPY;  Service: Cardiovascular;  Laterality: N/A;   COLONOSCOPY     PALATE SURGERY     RIGHT HEART CATH N/A 09/11/2022   Procedure: RIGHT HEART CATH;  Surgeon: Runell Gess, MD;  Location: [facility] INVASIVE CV LAB;  Service: Cardiovascular;  Laterality: N/A;   TEE WITHOUT CARDIOVERSION N/A 07/01/2014   Procedure: TRANSESOPHAGEAL ECHOCARDIOGRAM (TEE);  Surgeon: Thurmon Fair, MD;  Location: [facility] ENDOSCOPY;  Service: Cardiovascular;  Laterality: N/A;   TONSILLECTOMY     TOTAL KNEE ARTHROPLASTY Right 08/27/2017   Procedure: RIGHT TOTAL KNEE ARTHROPLASTY;  Surgeon: Dannielle Huh, MD;  Location: [facility] OR;  Service: Orthopedics;  Laterality: Right;    Current Medications: Current Meds  Medication Sig   allopurinol (ZYLOPRIM) 100 MG tablet TAKE 2 TABLETS EVERY DAY   co-enzyme Q-10 30 MG capsule Take 30 mg by mouth every Monday, Wednesday, and Friday.   ELIQUIS 5 MG TABS tablet TAKE 1 TABLET TWICE  DAILY   fluticasone (FLONASE) 50 MCG/ACT nasal spray Place 1 spray into both nostrils every other day. (Patient taking differently: Place 1 spray into both nostrils every Monday, Wednesday, and Friday. Nightly)   lisinopril (ZESTRIL) 10 MG tablet Take 1 tablet (10 mg total) by mouth daily.   minocycline (MINOCIN,DYNACIN) 100 MG capsule Take 100 mg by mouth every Monday, Wednesday, and Friday.   Multiple Vitamin (MULTIVITAMIN) tablet Take 1 tablet by mouth daily.   simvastatin (ZOCOR) 20 MG tablet Take 1 tablet (20 mg total) by mouth every other day.   tamsulosin (FLOMAX) 0.4 MG CAPS capsule  Take 1 capsule (0.4 mg total) by mouth daily.     Allergies:   Patient has no known allergies.   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    Comment: retired  Tobacco Use   Smoking status: Former   Smokeless tobacco: Never  Building services engineer Use: Never used  Substance and Sexual Activity   Alcohol use: Yes    Alcohol/week: 8.0 standard drinks of alcohol    Types: 8 Glasses of wine per week    Comment: ocassionally    Drug use: No   Sexual activity: Not Currently  Other Topics Concern   Not on file  Social History Narrative   Lives alone;wife deceased in the last year;two story house, but occupies main level mostly   Has 4 children, two biological, two stepchildren, 9 grandkids   One son local, supportive    Active playing golf with friends, goes to Systems analyst 2 days/week at [employer]   Enjoys reading, soduku   Social Determinants of Health   Financial Resource Strain: Low Risk  (07/03/2022)   Overall Financial Resource Strain (CARDIA)    Difficulty of Paying Living Expenses: Not very hard  Food Insecurity: No Food Insecurity (09/12/2022)   Hunger Vital Sign    Worried About Running Out of Food in the Last Year: Never true    Ran Out of Food in the Last Year: Never true  Transportation Needs: No Transportation Needs (09/12/2022)   PRAPARE - Administrator, Civil Service (Medical): No    Lack of Transportation (Non-Medical): No  Physical Activity: Insufficiently Active (05/26/2022)   Exercise Vital Sign    Days of Exercise per Week: 2 days    Minutes of Exercise per Session: 30 min  Stress: No Stress Concern Present (05/26/2022)   [employer] of Occupational Health - Occupational Stress Questionnaire    Feeling of Stress : Not  at all  Social Connections: Socially Integrated (05/20/2021)   Social Connection and Isolation Panel [NHANES]    Frequency of Communication with Friends and Family: More than three times a week    Frequency of Social Gatherings with Friends and Family: More than three times a week    Attends Religious Services: More than 4 times per year    Active Member of [employer] or Organizations: Yes    Attends Engineer, structural: More than 4 times per year    Marital Status: Married     Family History: The patient's family history includes CAD in his brother; Cancer in his father; Diabetes in his mother; Hypotension in his sister; Liver cancer in  his father; Stomach cancer in his paternal aunt.  ROS:   Please see the history of present illness.     EKGs/Labs/Other Studies Reviewed:    Recent Labs: 09/08/2022: TSH 3.626 09/10/2022: ALT 20; Platelets 128 09/11/2022: BUN 42; Creatinine, Ser 1.33; Hemoglobin 14.3; Hemoglobin 14.3; Magnesium 1.9; Potassium 4.8; Potassium 4.8; Sodium 140; Sodium 140   Recent Lipid Panel Lab Results  Component Value Date/Time   CHOL 146 02/13/2022 09:16 AM   TRIG 77.0 02/13/2022 09:16 AM   HDL 38.70 (L) 02/13/2022 09:16 AM   LDLCALC 92 02/13/2022 09:16 AM   LDLCALC 83 01/26/2020 09:13 AM   LDLDIRECT 143.5 01/22/2012 09:16 AM    Physical Exam:    VS:  BP 138/64   Pulse 66   Ht 6\' 3"  (1.905 m)   Wt 253 lb 12.8 oz (115.1 kg)   SpO2 98%   BMI 31.72 kg/m    No data found.   Wt Readings from Last 3 Encounters:  10/05/22 253 lb 12.8 oz (115.1 kg)  10/03/22 253 lb 4.8 oz (114.9 kg)  09/07/22 245 lb (111.1 kg)     GEN:  Well nourished, well developed in no acute distress -looks younger than stated age HEENT: Normal NECK: No JVD; No carotid bruits CARDIAC: Irregular irregular, no murmurs, rubs, gallops RESPIRATORY:  Clear to auscultation without rales, wheezing or rhonchi  ABDOMEN: Soft, non-tender, non-distended MUSCULOSKELETAL: No edema SKIN: Warm  and dry NEUROLOGIC:  Alert and oriented PSYCHIATRIC:  Normal affect    ASSESSMENT AND PLAN   Syncope -08/2022 on wife's 1 year anniversary of passing away, no prodrome.   -Inpatient 08/2022 tele: high 30s at night, typically in the 50s during the day. No significant pauses seen.  -Echo 08/2022: large pericardial effusion without evidence of overt tamponade  -?component of hypovolemia or possible vagal response contributing to his syncopal event.  -Zio patch - worn for 2 weeks, results pending.  If unremarkable, per Dr. Royann Shivers plan Loop recorder implant. -Follow-up echo June 21 -Continue hydration.  Recommend driving restrictions until testing returns. -Follow-up in clinic in 2 months.  Large pericardial effusion -No chest pain to suggest pericarditis -CRP/Sed rate WNL, TSH WNL -Repeat echo June 21  Atrial flutter/fibrillation, asymptomatic -Continue Eliquis 5 mg twice daily; denies bleeding issues  Hypertension -Continue lisinopril 10 mg daily.  Allow some permissive hypertension given history of syncope.  Hyperlipidemia -Lipids followed by PCP.  LDL 92 in October 2023.  On simvastatin.  Disposition - Follow-up in 2 months.  Will follow-up testing in the meanwhile.  He has a appointment with Dr. Tresa Endo later this month for sleep apnea management.    Medication Adjustments/Labs and Tests Ordered: Current medicines are reviewed at length with the patient today.  Concerns regarding medicines are outlined above.  No orders of the defined types were placed in this encounter.  No orders of the defined types were placed in this encounter.   Patient Instructions  Medication Instructions:  No Changes *If you need a refill on your cardiac medications before your next appointment, please call your pharmacy*   Lab Work: No Labs If you have labs (blood work) drawn today and your tests are completely normal, you will receive your results only by: MyChart Message (if you have  MyChart) OR A paper copy in the mail If you have any lab test that is abnormal or we need to change your treatment, we will call you to review the results.   Testing/Procedures: No Testing   Follow-Up:  At [facility], you and your health needs are our priority.  As part of our continuing mission to provide you with exceptional heart care, we have created designated Provider Care Teams.  These Care Teams include your primary Cardiologist (physician) and Advanced Practice Providers (APPs -  Physician Assistants and Nurse Practitioners) who all work together to provide you with the care you need, when you need it.  We recommend signing up for the patient portal called "MyChart".  Sign up information is provided on this After Visit Summary.  MyChart is used to connect with patients for Virtual Visits (Telemedicine).  Patients are able to view lab/test results, encounter notes, upcoming appointments, etc.  Non-urgent messages can be sent to your provider as well.   To learn more about what you can do with MyChart, go to [email or web address].    Your next appointment:   2-3 month(s)  Provider:   Juanda Crumble, PA-C    or, Thurmon Fair, MD    Signed, Cannon Kettle, PA-C  10/05/2022 10:47 AM    [facility]

## 2022-10-03 NOTE — Progress Notes (Signed)
Established Patient Office Visit  Subjective   Patient ID: ARTA BAZ, male    DOB: 07/13/1935  Age: 87 y.o. MRN: [id number]  Chief Complaint  Patient presents with   Hospitalization Follow-up    HPI   [name] is here for hospital follow-up.  His chronic problems include history of hypertension, atrial flutter, obstructive sleep apnea, rosacea, chronic mild thrombocytopenia, gout, hyperlipidemia.  He was admitted May 9 following syncopal episode after eating supper with some friends.  He had completely finished eating and he was sitting down talking when he apparently went out suddenly.  He has no recollection of the event and no prodrome.  Ironically, this is exactly 1 year following the passing of his wife from complications of cancer.  EMS was called and patient had heart rate low 40s with systolic blood pressure in the 70s on arrival.  He was given 500 cc bolus of normal saline and on arrival to the ER heart rate of 60 with atrial fibrillation.  CT head unremarkable.  Echocardiogram revealed large pericardial effusion but was not felt this was contributing to his syncopal event likely.  Low clinical suspicion for vasovagal episode.  No clinical evidence to suggest seizure.  Patient had follow-up echo which was stable.  Right heart catheterization was done and it was felt that his effusion was not contributing to his symptoms.  His sed rate and C-reactive protein were normal.  ANA antibodies negative.  Zio patch ordered at discharge he recently returned that back to cardiology.  He has felt fine since discharge.  No recurrent dizziness or syncope.  No chest pains.  He is not driving any.  Past Medical History:  Diagnosis Date   Adenomatous colon polyp 06/2000   ALLERGIC RHINITIS 10/03/2007   Allergy    seasonal   Arthritis    knees   Atrial flutter (HCC) 06/26/2014   Cancer (HCC)    skin cancers removed in the past basal cell and squamous cell   CARDIAC MURMUR, AORTIC 10/03/2007    ERECTILE DYSFUNCTION 10/03/2007   High blood pressure 03/2016   HYPERLIPIDEMIA 10/03/2007   Rosacea    Sleep apnea    Past Surgical History:  Procedure Laterality Date   ADENOIDECTOMY     CARDIOVERSION N/A 07/01/2014   Procedure: CARDIOVERSION;  Surgeon: Thurmon Fair, MD;  Location: [facility] ENDOSCOPY;  Service: Cardiovascular;  Laterality: N/A;   COLONOSCOPY     PALATE SURGERY     RIGHT HEART CATH N/A 09/11/2022   Procedure: RIGHT HEART CATH;  Surgeon: Runell Gess, MD;  Location: [facility] INVASIVE CV LAB;  Service: Cardiovascular;  Laterality: N/A;   TEE WITHOUT CARDIOVERSION N/A 07/01/2014   Procedure: TRANSESOPHAGEAL ECHOCARDIOGRAM (TEE);  Surgeon: Thurmon Fair, MD;  Location: [facility] ENDOSCOPY;  Service: Cardiovascular;  Laterality: N/A;   TONSILLECTOMY     TOTAL KNEE ARTHROPLASTY Right 08/27/2017   Procedure: RIGHT TOTAL KNEE ARTHROPLASTY;  Surgeon: Dannielle Huh, MD;  Location: [facility] OR;  Service: Orthopedics;  Laterality: Right;    reports that he has quit smoking. He has never used smokeless tobacco. He reports current alcohol use of about 8.0 standard drinks of alcohol per week. He reports that he does not use drugs. family history includes CAD in his brother; Cancer in his father; Diabetes in his mother; Hypotension in his sister; Liver cancer in his father; Stomach cancer in his paternal aunt. No Known Allergies  Review of Systems  Constitutional:  Negative for malaise/fatigue.  Eyes:  Negative for blurred vision.  Respiratory:  Negative for shortness of breath.   Cardiovascular:  Negative for chest pain.  Neurological:  Negative for dizziness, weakness and headaches.      Objective:     BP 130/60 (BP Location: Left Arm, Patient Position: Sitting, Cuff Size: Normal)   Pulse (!) 49   Temp 98.1 F (36.7 C) (Oral)   Ht 6\' 3"  (1.905 m)   Wt 253 lb 4.8 oz (114.9 kg)   SpO2 97%   BMI 31.66 kg/m  BP Readings from Last 3 Encounters:  10/03/22 130/60  09/11/22 (!) 152/78  06/05/22 (!)  100/54   Wt Readings from Last 3 Encounters:  10/03/22 253 lb 4.8 oz (114.9 kg)  09/07/22 245 lb (111.1 kg)  06/05/22 249 lb 9.6 oz (113.2 kg)      Physical Exam Vitals reviewed.  Constitutional:      Appearance: He is well-developed.  HENT:     Right Ear: External ear normal.     Left Ear: External ear normal.  Eyes:     Pupils: Pupils are equal, round, and reactive to light.  Neck:     Thyroid: No thyromegaly.  Cardiovascular:     Rate and Rhythm: Normal rate and regular rhythm.  Pulmonary:     Effort: Pulmonary effort is normal. No respiratory distress.     Breath sounds: Normal breath sounds. No wheezing or rales.  Musculoskeletal:     Cervical back: Neck supple.     Right lower leg: No edema.     Left lower leg: No edema.  Neurological:     Mental Status: He is alert and oriented to person, place, and time.      No results found for any visits on 10/03/22.  Last CBC Lab Results  Component Value Date   WBC 6.1 09/10/2022   HGB 14.3 09/11/2022   HGB 14.3 09/11/2022   HCT 42.0 09/11/2022   HCT 42.0 09/11/2022   MCV 98.5 09/10/2022   MCH 32.8 09/10/2022   RDW 13.4 09/10/2022   PLT 128 (L) 09/10/2022   Last metabolic panel Lab Results  Component Value Date   GLUCOSE 141 (H) 09/11/2022   NA 140 09/11/2022   NA 140 09/11/2022   K 4.8 09/11/2022   K 4.8 09/11/2022   CL 108 09/11/2022   CO2 22 09/11/2022   BUN 42 (H) 09/11/2022   CREATININE 1.33 (H) 09/11/2022   GFRNONAA 53 (L) 09/11/2022   CALCIUM 8.4 (L) 09/11/2022   PROT 5.6 (L) 09/10/2022   ALBUMIN 3.0 (L) 09/10/2022   BILITOT 0.8 09/10/2022   ALKPHOS 56 09/10/2022   AST 18 09/10/2022   ALT 20 09/10/2022   ANIONGAP 4 (L) 09/11/2022   Last lipids Lab Results  Component Value Date   CHOL 146 02/13/2022   HDL 38.70 (L) 02/13/2022   LDLCALC 92 02/13/2022   LDLDIRECT 143.5 01/22/2012   TRIG 77.0 02/13/2022   CHOLHDL 4 02/13/2022   Last hemoglobin A1c Lab Results  Component Value Date    HGBA1C 6.3 05/26/2022   Last thyroid functions Lab Results  Component Value Date   TSH 3.626 09/08/2022   Last vitamin B12 and Folate Lab Results  Component Value Date   VITAMINB12 385 09/08/2022      The ASCVD Risk score (Arnett DK, et al., 2019) failed to calculate for the following reasons:   The 2019 ASCVD risk score is only valid for ages 76 to 2    Assessment & Plan:   #1 syncopal episode  prompting recent hospitalization as above.  Did not sound typical for vasovagal syncope.  Question cardiogenic with recent documented severe bradycardia and atrial flutter with slow ventricular response.  Remains on Eliquis.  Had recent Zio patch and has pending follow-up with cardiology.  No further episodes since hospital discharge.  He is not driving at this time and will inquire of cardiologist regarding when he can drive again  #2 pericardial effusion by recent echo not felt to be contributing to recent syncopal episode.  No evidence for clinically significant tamponade.  C-reactive protein and sed rate normal.  Follow-up repeat echo per cardiology.  #3 hypertension stable and adequately controlled on lisinopril 10 mg daily.  #4 chronic macrocytosis with recent normal B12 level.  #5 gout-stable on allopurinol with no recent flareups.  #6 rosacea treated with minocycline  #7 obstructive sleep apnea-on nightly CPAP  #8 hyperlipidemia treated with simvastatin  #9 history of prediabetes range blood sugars..  A1c 6.3% back in January.  A1c's have been very stable over the past several years  Evelena Peat, MD

## 2022-10-04 ENCOUNTER — Ambulatory Visit: Payer: Medicare HMO | Admitting: Physician Assistant

## 2022-10-05 ENCOUNTER — Ambulatory Visit: Payer: Medicare HMO | Attending: Physician Assistant | Admitting: Physician Assistant

## 2022-10-05 VITALS — BP 138/64 | HR 66 | Ht 75.0 in | Wt 253.8 lb

## 2022-10-05 DIAGNOSIS — I483 Typical atrial flutter: Secondary | ICD-10-CM

## 2022-10-05 DIAGNOSIS — R55 Syncope and collapse: Secondary | ICD-10-CM

## 2022-10-05 DIAGNOSIS — I3139 Other pericardial effusion (noninflammatory): Secondary | ICD-10-CM | POA: Diagnosis not present

## 2022-10-05 NOTE — Patient Instructions (Signed)
Medication Instructions:  No Changes *If you need a refill on your cardiac medications before your next appointment, please call your pharmacy*   Lab Work: No Labs If you have labs (blood work) drawn today and your tests are completely normal, you will receive your results only by: MyChart Message (if you have MyChart) OR A paper copy in the mail If you have any lab test that is abnormal or we need to change your treatment, we will call you to review the results.   Testing/Procedures: No Testing   Follow-Up: At [facility], you and your health needs are our priority.  As part of our continuing mission to provide you with exceptional heart care, we have created designated Provider Care Teams.  These Care Teams include your primary Cardiologist (physician) and Advanced Practice Providers (APPs -  Physician Assistants and Nurse Practitioners) who all work together to provide you with the care you need, when you need it.  We recommend signing up for the patient portal called "MyChart".  Sign up information is provided on this After Visit Summary.  MyChart is used to connect with patients for Virtual Visits (Telemedicine).  Patients are able to view lab/test results, encounter notes, upcoming appointments, etc.  Non-urgent messages can be sent to your provider as well.   To learn more about what you can do with MyChart, go to [email or web address].    Your next appointment:   2-3 month(s)  Provider:   Juanda Crumble, PA-C    or, Thurmon Fair, MD

## 2022-10-11 NOTE — Addendum Note (Signed)
Encounter addended by: Flavia Shipper on: 10/11/2022 1:16 PM  Actions taken: Imaging Exam ended

## 2022-10-17 ENCOUNTER — Ambulatory Visit: Payer: Medicare HMO | Admitting: Family Medicine

## 2022-10-19 DIAGNOSIS — H35033 Hypertensive retinopathy, bilateral: Secondary | ICD-10-CM | POA: Diagnosis not present

## 2022-10-19 DIAGNOSIS — H34832 Tributary (branch) retinal vein occlusion, left eye, with macular edema: Secondary | ICD-10-CM | POA: Diagnosis not present

## 2022-10-19 DIAGNOSIS — H3582 Retinal ischemia: Secondary | ICD-10-CM | POA: Diagnosis not present

## 2022-10-19 DIAGNOSIS — H35361 Drusen (degenerative) of macula, right eye: Secondary | ICD-10-CM | POA: Diagnosis not present

## 2022-10-19 DIAGNOSIS — H43813 Vitreous degeneration, bilateral: Secondary | ICD-10-CM | POA: Diagnosis not present

## 2022-10-20 ENCOUNTER — Other Ambulatory Visit: Payer: Self-pay | Admitting: Emergency Medicine

## 2022-10-20 ENCOUNTER — Ambulatory Visit (HOSPITAL_COMMUNITY): Payer: Medicare HMO | Attending: Cardiovascular Disease

## 2022-10-20 DIAGNOSIS — I3139 Other pericardial effusion (noninflammatory): Secondary | ICD-10-CM | POA: Diagnosis not present

## 2022-10-20 LAB — ECHOCARDIOGRAM COMPLETE
Area-P 1/2: 4.52 cm2
S' Lateral: 3.1 cm

## 2022-10-20 NOTE — Progress Notes (Signed)
I called Gary [name] to discuss the echo findings. These are largely unchanged from last month. He has not had another syncopal event and denies dyspnea orr edema. No convincing evidence of tamponade. The cause of the effusion remains unclear, but pericardiocentesis does not appear to be indicated. Not an inflammatory effusion based on fluid appearance on [place] and labs from last month. Asked him to call us if he develops edema or dyspnea. Please get another limited echo for effusion in 4-6 weeks, before the August 7 appointment with Gary [name] - I am in the office that day as well.

## 2022-10-25 ENCOUNTER — Encounter: Payer: Self-pay | Admitting: Cardiovascular Disease

## 2022-10-25 ENCOUNTER — Ambulatory Visit: Payer: Medicare HMO | Attending: Cardiovascular Disease | Admitting: Cardiovascular Disease

## 2022-10-25 DIAGNOSIS — Z7901 Long term (current) use of anticoagulants: Secondary | ICD-10-CM

## 2022-10-25 DIAGNOSIS — I483 Typical atrial flutter: Secondary | ICD-10-CM | POA: Diagnosis not present

## 2022-10-25 DIAGNOSIS — I10 Essential (primary) hypertension: Secondary | ICD-10-CM

## 2022-10-25 DIAGNOSIS — G4733 Obstructive sleep apnea (adult) (pediatric): Secondary | ICD-10-CM

## 2022-10-25 NOTE — Progress Notes (Unsigned)
Patient ID: Gary [name], male   DOB: 1935-07-12, 87 y.o.   MRN: [id number]       HPI: Gary [name], is a 87 y.o. male who presents for a 5 month F/U sleep evaluation   [name]  is a patient of Dr. Royann Shivers.  He is status post cardioversion for atrial flutter, and has a history of hyperlipidemia and aortic valve sclerosis.  He was referred for a sleep study which was done in May 2016 which revealed moderate obstructive sleep apnea with an AHI of 15.9 overall and 20.5 per hour during REM sleep.  He had reduced sleep efficiency at 54.8%.  Latency to sleep onset was prolonged at 36 minutes.  There was mild snoring.  Oxygen desaturated to 89%.  He underwent a CPAP titration trial and adnexal response to CPAP therapy with the initial recommended CPAP pressure 9 cm water pressure.  Due to oral venting, a nasal pillow mask with chinstrap was recommended.  Since initiating CPAP therapy, he has felt well.  He typically goes to bed at 10:30 and wakes up the proximal he 7:30 AM.  A download was obtained from 11/22/2014 through 12/21/2014.  Compliance was excellent with 100% days used and 100% of days used greater than 4 hours.  He is averaging 7 hours and 30 minutes of sleep per night.  On his 57 m water pressure, AHI is excellent at 0.8.  There is no significant mask leak and he has a ResMed AirFit P10 medium size mask.  A download was obtained from 11/28/2015 through 12/27/2015.  This confirms 100% compliance both with usage stays and greater than 4 hours of use.  He is averaging 7 hours and 22 minutes of use per night.  He is set at a 9 cm water pressure.  AHI remains excellent at 1.2 per hour.  He does have increased leak with 95th percent average at 30.0.  Despite this mild leak newborn nasal pillows.  His AHI remains excellent and undoubtedly is probably slightly less than its current reading.  He denied residual snoring.  Sleep is restorative.  He denied daytime sleepiness or bruxism.[name]   He was  found to have increased periodic limb movements on his initial sleep study and had increased PLMS index of 19.7, but denies painful restless legs or the "urge to move."  Epworth Sleepiness Scale: Situation   Chance of Dozing/Sleeping (0 = never , 1 = slight chance , 2 = moderate chance , 3 = high chance )   sitting and reading 1   watching TV 1   sitting inactive in a public place 0   being a passenger in a motor vehicle for an hour or more 1   lying down in the afternoon 2   sitting and talking to someone 0   sitting quietly after lunch (no alcohol) 0   while stopped for a few minutes in traffic as the driver 0   Total Score  5   I saw him in October 2018 at which time he continued to have 100% compliance was CPAP therapy.  He was averaging 6 hours and 26 minutes per night of use.  At a set pressure of 9 cm, AHI was excellent at 0.7.  He has a Lawyer P55medium-size with chinstrap mask.  There was minimal leak intermittently.  An Epworth Sleepiness Scale score was recalculated and this endorsed at 4.   He was levaluated by me in November 2019 at which time he continued  to do well with reference to his sleep apnea. A new download was obtained in the office today from October 11 through March 09, 2018.  He continued to be compliant and is averaging 7 hours and 36 minutes of CPAP use per night.  AHI remains excellent at 0.7.  At times he has had some irritation from his mask.  His sleep is restorative.  He is unaware of breakthrough snoring.  He continues to have atrial flutter which is chronic and is followed by Dr. Royann Shivers.    He developed COVID-19 infection in December, 2020 and was last seen in our office in January 2021 by Azalee Course, PA.  He was having some palpitations.  I saw him in November 2019.  At that time he continued to be on CPAP therapy with excellent compliane. A download was obtained from August 18, 2019 through Sep 16, 2019 which confirms 100% compliance with average usage  7 hours and 14 minutes.  At 9 cm set pressure, AHI is excellent at 0.5.  He has a DreamWear nasal mask.  He is sleeping well.  He wasunaware of breakthrough snoring.  He typically goes to bed between 11 and 1130 and wakes up at 7:30 AM.  Blood pressure is recently been slightly increased.    He saw Dr. Royann Shivers November 2021 and remained stable with asymptomatic and spontaneously rate controlled atrial flutter.  He was not having symptoms of bradycardia and it was not felt he needed a pacemaker.  He had continued to be on Eliquis.  I last saw him on Sep 16, 2020.  He was continuing to use CPAP.  His CPAP unit is in ResMed air sense 10 auto unit at 9 cm set pressure.  With his older unit having only 3G capability, effective mid April, we are not able to access his data wirelessly since only 5G units are now able to be accessed.  However, download was obtained from March 1 through August 08, 2020 which shows excellent compliance with average sleep at 7 hours and 31 minutes with CPAP and 98% of usage days.  At 9 cm set pressure, AHI is 0.5.  He has a DreamWear mask.  He is exercising 2 days/week with a trainer.  He has noticed his blood pressure being elevated at home.    I last saw him on May 08, 2022., he has continued to be followed by Dr. Royann Shivers and last saw him in February 2023.  From a sleep perspective, he has continued to use CPAP.  His machine is no longer wireless since his set up date was in June 2016 and technology is now 5G.  He is in need for a new machine and also will need to change his DME company from [facility] to another company based on his insurance.  He brought his machine to the office today.  A download was obtained from December 7 through May 05, 2022.  Usage is 100%.  At 9 cm water pressure AHI is excellent at 0.6 events per hour.  He typically goes to bed between 1130 and midnight and wakes up between 8 and 8:30.  Average use was 7 hours and 3 minutes.  During that  evaluation, he qualified for a new machine and scheduled him to have a new ResMed AirSense 11 CPAP auto unit.  Mr. Garlitz received his new ResMed AirSense 11 AutoSet unit on June 06, 2022.  He has a new DME.  A download was obtained from May 28  through October 25, 2022 which shows excellent compliance.  Average use is 6 hours and 22 minutes.  At 9 cm set pressure, AHI 0.5.  He does have significant mask leak.  Typically he goes to bed around 1130 to midnight and wakes up at 8 AM.  He has had mask issues.  He presents for evaluation.   Past Medical History:  Diagnosis Date   Adenomatous colon polyp 06/2000   ALLERGIC RHINITIS 10/03/2007   Allergy    seasonal   Arthritis    knees   Atrial flutter (HCC) 06/26/2014   Cancer (HCC)    skin cancers removed in the past basal cell and squamous cell   CARDIAC MURMUR, AORTIC 10/03/2007   ERECTILE DYSFUNCTION 10/03/2007   High blood pressure 03/2016   HYPERLIPIDEMIA 10/03/2007   Rosacea    Sleep apnea     Past Surgical History:  Procedure Laterality Date   ADENOIDECTOMY     CARDIOVERSION N/A 07/01/2014   Procedure: CARDIOVERSION;  Surgeon: Thurmon Fair, MD;  Location: [facility] ENDOSCOPY;  Service: Cardiovascular;  Laterality: N/A;   COLONOSCOPY     PALATE SURGERY     RIGHT HEART CATH N/A 09/11/2022   Procedure: RIGHT HEART CATH;  Surgeon: Runell Gess, MD;  Location: [facility] INVASIVE CV LAB;  Service: Cardiovascular;  Laterality: N/A;   TEE WITHOUT CARDIOVERSION N/A 07/01/2014   Procedure: TRANSESOPHAGEAL ECHOCARDIOGRAM (TEE);  Surgeon: Thurmon Fair, MD;  Location: [facility] ENDOSCOPY;  Service: Cardiovascular;  Laterality: N/A;   TONSILLECTOMY     TOTAL KNEE ARTHROPLASTY Right 08/27/2017   Procedure: RIGHT TOTAL KNEE ARTHROPLASTY;  Surgeon: Dannielle Huh, MD;  Location: [facility] OR;  Service: Orthopedics;  Laterality: Right;    No Known Allergies  Current Outpatient Medications  Medication Sig Dispense Refill   allopurinol (ZYLOPRIM) 100 MG tablet TAKE 2 TABLETS  EVERY DAY 120 tablet 5   co-enzyme Q-10 30 MG capsule Take 30 mg by mouth every Monday, Wednesday, and Friday.     ELIQUIS 5 MG TABS tablet TAKE 1 TABLET TWICE DAILY 180 tablet 3   fluticasone (FLONASE) 50 MCG/ACT nasal spray Place 1 spray into both nostrils every other day. (Patient taking differently: Place 1 spray into both nostrils every Monday, Wednesday, and Friday. Nightly) 16 g 4   lisinopril (ZESTRIL) 10 MG tablet Take 1 tablet (10 mg total) by mouth daily. 90 tablet 3   minocycline (MINOCIN,DYNACIN) 100 MG capsule Take 100 mg by mouth every Monday, Wednesday, and Friday.     Multiple Vitamin (MULTIVITAMIN) tablet Take 1 tablet by mouth daily.     simvastatin (ZOCOR) 20 MG tablet Take 1 tablet (20 mg total) by mouth every other day. 45 tablet 3   tamsulosin (FLOMAX) 0.4 MG CAPS capsule Take 1 capsule (0.4 mg total) by mouth daily. 90 capsule 3   No current facility-administered medications for this visit.    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    Comment: retired  Tobacco Use   Smoking status: Former   Smokeless tobacco: Never  Building services engineer Use: Never used  Substance and Sexual Activity   Alcohol use: Yes    Alcohol/week: 8.0 standard drinks of alcohol    Types: 8 Glasses of wine per  week    Comment: ocassionally    Drug use: No   Sexual activity: Not Currently  Other Topics Concern   Not on file  Social History Narrative   Lives alone;wife deceased in the last year;two story house, but occupies main level mostly   Has 4 children, two biological, two stepchildren, 9 grandkids   One son local, supportive    Active playing golf with friends, goes to Systems analyst 2 days/week at [employer]   Enjoys reading, soduku   Social Determinants  of Health   Financial Resource Strain: Low Risk  (07/03/2022)   Overall Financial Resource Strain (CARDIA)    Difficulty of Paying Living Expenses: Not very hard  Food Insecurity: No Food Insecurity (09/12/2022)   Hunger Vital Sign    Worried About Running Out of Food in the Last Year: Never true    Ran Out of Food in the Last Year: Never true  Transportation Needs: No Transportation Needs (09/12/2022)   PRAPARE - Administrator, Civil Service (Medical): No    Lack of Transportation (Non-Medical): No  Physical Activity: Insufficiently Active (05/26/2022)   Exercise Vital Sign    Days of Exercise per Week: 2 days    Minutes of Exercise per Session: 30 min  Stress: No Stress Concern Present (05/26/2022)   [employer] of Occupational Health - Occupational Stress Questionnaire    Feeling of Stress : Not at all  Social Connections: Socially Integrated (05/20/2021)   Social Connection and Isolation Panel [NHANES]    Frequency of Communication with Friends and Family: More than three times a week    Frequency of Social Gatherings with Friends and Family: More than three times a week    Attends Religious Services: More than 4 times per year    Active Member of [employer] or Organizations: Yes    Attends Engineer, structural: More than 4 times per year    Marital Status: Married  Catering manager Violence: Not At Risk (05/20/2021)   Humiliation, Afraid, Rape, and Kick questionnaire    Fear of Current or Ex-Partner: No    Emotionally Abused: No    Physically Abused: No    Sexually Abused: No    Family History  Problem Relation Age of Onset   Diabetes Mother    Cancer Father        liver ca   Liver cancer Father    Hypotension Sister    CAD Brother    Stomach cancer Paternal Aunt      ROS General: Negative; No fevers, chills, or night sweats HEENT: Negative; No changes in vision or hearing, sinus congestion, difficulty swallowing Pulmonary: Negative; No cough,  wheezing, shortness of breath, hemoptysis Cardiovascular: History of atrial flutter GI: Negative; No nausea, vomiting, diarrhea, or abdominal pain GU: Negative; No dysuria, hematuria, or difficulty voiding Musculoskeletal: Negative; no myalgias, joint pain, or weakness Hematologic: Negative; no easy bruising, bleeding Endocrine: Negative; no heat/cold intolerance Neuro: Negative; no changes in balance, headaches Skin: Negative; No rashes or skin lesions Psychiatric: Negative; No behavioral problems, depression Sleep: See history of present illness   Physical Exam BP 134/82   Pulse (!) 54   Ht 6\' 3"  (1.905 m)   Wt 255 lb 6.4 oz (115.8 kg)   SpO2 92%   BMI 31.92 kg/m    Repeat blood pressure by me was 124/78  Wt Readings from Last 3 Encounters:  10/25/22 255 lb 6.4 oz (115.8 kg)  10/05/22 253 lb 12.8 oz (  115.1 kg)  10/03/22 253 lb 4.8 oz (114.9 kg)    General: Alert, oriented, no distress.  Skin: normal turgor, no rashes, warm and dry HEENT: Normocephalic, atraumatic. Pupils equal round and reactive to light; sclera anicteric; extraocular muscles intact;  Nose without nasal septal hypertrophy Mouth/Parynx benign; Mallinpatti scale; s/p UPPPBiPAPLungs: clear to ausculatation and percussion; no wheezing or rales Chest wall: without tenderness to palpitation Heart: PMI not displaced, RRR, s1 s2 normal, 1/6 systolic murmur, no diastolic murmur, no rubs, gallops, thrills, or heaves Abdomen: Diastases recti; soft, nontender; no hepatosplenomehaly, BS+; abdominal aorta nontender and not dilated by palpation. Back: no CVA tenderness Pulses 2+ Musculoskeletal: full range of motion, normal strength, no joint deformities Extremities: Trace ankle edema no clubbing cyanosis or edema, Homan's sign negative  Neurologic: grossly nonfocal; Cranial nerves grossly wnl Psychologic: Normal mood and affect    EKG Interpretation Date/Time:  Wednesday October 25 2022 09:00:35 EDT Ventricular Rate:   54 PR Interval:    QRS Duration:  96 QT Interval:  454 QTC Calculation: 430 R Axis:   48  Text Interpretation: Atrial Flutter with variable block Low voltage QRS Nonspecific ST and T wave abnormality When compared with ECG of 08-Sep-2022 03:02, PREVIOUS ECG IS PRESENT Confirmed by Nicki Guadalajara ([id number]) on 10/25/2022 9:37:28 AM     May 08, 2022 ECG (independently read by me): Atrial flutter at 54 with variable block  Sep 16, 2020 ECG (independently read by me): Atrial flutter at 59, variable block, predominantly 4:1  May 13, 2019 ECG (independently read by me): Atrial flutter with variable block at 56  November 2019 ECG (independently read by me): Atrial flutter at 56 bpm with variable block.  October 2018 ECG (independently read by me): Atrial flutter with a ventricular rate at 70 bpm.  Nonspecific ST changes.  QTc interval 4o6 ms.  LABS:     Latest Ref Rng & Units 09/11/2022   12:49 PM 09/11/2022    2:46 AM 09/10/2022    5:48 AM  BMP  Glucose 70 - 99 mg/dL  284  132   BUN 8 - 23 mg/dL  42  47   Creatinine 4.40 - 1.24 mg/dL  1.02  7.25   Sodium 366 - 145 mmol/L 135 - 145 mmol/L 140    140  134  138   Potassium 3.5 - 5.1 mmol/L 3.5 - 5.1 mmol/L 4.8    4.8  4.4  4.6   Chloride 98 - 111 mmol/L  108  109   CO2 22 - 32 mmol/L  22  22   Calcium 8.9 - 10.3 mg/dL  8.4  8.5         Latest Ref Rng & Units 09/10/2022    5:48 AM 02/13/2022    9:16 AM 02/02/2021   10:07 AM  Hepatic Function  Total Protein 6.5 - 8.1 g/dL 5.6  6.7  6.2   Albumin 3.5 - 5.0 g/dL 3.0  4.0  3.9   AST 15 - 41 U/L 18  18  17    ALT 0 - 44 U/L 20  19  17    Alk Phosphatase 38 - 126 U/L 56  61  53   Total Bilirubin 0.3 - 1.2 mg/dL 0.8  1.0  0.8   Bilirubin, Direct 0.0 - 0.3 mg/dL  0.2          Latest Ref Rng & Units 09/11/2022   12:49 PM 09/10/2022    5:48 AM 09/08/2022    5:47 AM  CBC  WBC 4.0 - 10.5 K/uL  6.1  6.1   Hemoglobin 13.0 - 17.0 g/dL 19.1 - 47.8 g/dL 29.5    62.1  30.8  65.7    Hematocrit 39.0 - 52.0 % 39.0 - 52.0 % 42.0    42.0  39.9  36.3   Platelets 150 - 400 K/uL  128  133      Lipid Panel     Component Value Date/Time   CHOL 146 02/13/2022 0916   TRIG 77.0 02/13/2022 0916   HDL 38.70 (L) 02/13/2022 0916   CHOLHDL 4 02/13/2022 0916   VLDL 15.4 02/13/2022 0916   LDLCALC 92 02/13/2022 0916   LDLCALC 83 01/26/2020 0913   LDLDIRECT 143.5 01/22/2012 0916     RADIOLOGY: ECHOCARDIOGRAM COMPLETE  Result Date: 10/20/2022    ECHOCARDIOGRAM REPORT   Patient Name:   EDWIN BAINES Date of Exam: 10/20/2022 Medical Rec #:  [id number]       Height:       75.0 in Accession #:    [id number]      Weight:       253.8 lb Date of Birth:  [birth year]      BSA:          2.428 m Patient Age:    86 years        BP:           138/64 mmHg Patient Gender: M               HR:           45 bpm. Exam Location:  [address] Procedure: 2D Echo, Cardiac Doppler and Color Doppler Indications:    I31.3 Pericardial effusion (noninflammatory)  History:        Patient has prior history of Echocardiogram examinations, most                 recent 09/08/2022. Arrythmias:Atrial Flutter,                 Signs/Symptoms:Murmur; Risk Factors:Hypertension, Dyslipidemia                 and Sleep Apnea.  Sonographer:    Cathie Beams RCS Referring Phys: Terrilee Croak IMPRESSIONS  1. Left ventricular ejection fraction, by estimation, is 55 to 60%. The left ventricle has normal function. The left ventricle has no regional wall motion abnormalities. Left ventricular diastolic function could not be evaluated.  2. Right ventricular systolic function is normal. The right ventricular size is normal. There is moderately elevated pulmonary artery systolic pressure. The estimated right ventricular systolic pressure is 54.4 mmHg.  3. Left atrial size was severely dilated.  4. Right atrial size was severely dilated.  5. Large pericardial effusion. The pericardial effusion is circumferential. There is no evidence of  cardiac tamponade.  6. The mitral valve is normal in structure. Mild mitral valve regurgitation. No evidence of mitral stenosis.  7. Tricuspid valve regurgitation is mild to moderate.  8. The aortic valve is tricuspid. Aortic valve regurgitation is mild. No aortic stenosis is present.  9. The inferior vena cava is dilated in size with <50% respiratory variability, suggesting right atrial pressure of 15 mmHg. Comparison(s): No significant change from prior study. Prior images reviewed side by side. There is an unchanged moderate-to-large pericardial effusion. The inferior vena cava remains plethoric, but there are no other signs of tamponade. FINDINGS  Left Ventricle: Left ventricular ejection fraction, by estimation, is 55 to 60%. The left ventricle  has normal function. The left ventricle has no regional wall motion abnormalities. The left ventricular internal cavity size was normal in size. There is  no left ventricular hypertrophy. Left ventricular diastolic function could not be evaluated due to atrial fibrillation. Left ventricular diastolic function could not be evaluated. Right Ventricle: The right ventricular size is normal. No increase in right ventricular wall thickness. Right ventricular systolic function is normal. There is moderately elevated pulmonary artery systolic pressure. The tricuspid regurgitant velocity is 3.14 m/s, and with an assumed right atrial pressure of 15 mmHg, the estimated right ventricular systolic pressure is 54.4 mmHg. Left Atrium: Left atrial size was severely dilated. Right Atrium: Right atrial size was severely dilated. Pericardium: A large pericardial effusion is present. The pericardial effusion is circumferential. There is no evidence of cardiac tamponade. Mitral Valve: The mitral valve is normal in structure. Mild mitral valve regurgitation. No evidence of mitral valve stenosis. Tricuspid Valve: The tricuspid valve is normal in structure. Tricuspid valve regurgitation is mild  to moderate. No evidence of tricuspid stenosis. Aortic Valve: The aortic valve is tricuspid. Aortic valve regurgitation is mild. No aortic stenosis is present. Pulmonic Valve: The pulmonic valve was normal in structure. Pulmonic valve regurgitation is mild. No evidence of pulmonic stenosis. Aorta: The aortic root is normal in size and structure. Venous: The inferior vena cava is dilated in size with less than 50% respiratory variability, suggesting right atrial pressure of 15 mmHg. IAS/Shunts: No atrial level shunt detected by color flow Doppler.  LEFT VENTRICLE PLAX 2D LVIDd:         4.60 cm LVIDs:         3.10 cm LV PW:         1.10 cm LV IVS:        1.00 cm LVOT diam:     2.50 cm LV SV:         93 LV SV Index:   38 LVOT Area:     4.91 cm  RIGHT VENTRICLE RV S prime:     9.91 cm/s TAPSE (M-mode): 1.5 cm RVSP:           54.4 mmHg LEFT ATRIUM              Index        RIGHT ATRIUM            Index LA diam:        5.30 cm  2.18 cm/m   RA Pressure: 15.00 mmHg LA Vol (A2C):   119.0 ml 49.01 ml/m  RA Area:     37.90 cm LA Vol (A4C):   105.0 ml 43.24 ml/m  RA Volume:   141.00 ml  58.07 ml/m LA Biplane Vol: 112.0 ml 46.12 ml/m  AORTIC VALVE             PULMONIC VALVE LVOT Vmax:   85.93 cm/s  PR End Diast Vel: 4.84 msec LVOT Vmean:  57.667 cm/s LVOT VTI:    0.190 m  AORTA Ao Root diam: 3.80 cm Ao Asc diam:  3.90 cm MITRAL VALVE               TRICUSPID VALVE MV Area (PHT): 4.52 cm    TR Peak grad:   39.4 mmHg MV Decel Time: 168 msec    TR Vmax:        314.00 cm/s MV E velocity: 77.37 cm/s  Estimated RAP:  15.00 mmHg MV A velocity: 64.13 cm/s  RVSP:  54.4 mmHg MV E/A ratio:  1.21                            SHUNTS                            Systemic VTI:  0.19 m                            Systemic Diam: 2.50 cm Rachelle Hora Croitoru MD Electronically signed by Thurmon Fair MD Signature Date/Time: 10/20/2022/1:42:58 PM    Final    LONG TERM MONITOR-LIVE TELEMETRY (3-14 DAYS)  Result Date: 10/17/2022   Throughout  the study, the baseline rhythm is atrial flutter with variable AV block.   Overall there is good ventricular rate control and normal circadian rate variation.  Ventricular rate range is 36-110 bpm, average of 55 bpm.   There are a handful of pauses lasting a maximum of 3.3 seconds, all of them nocturnal.  There are no pauses in excess of 5 seconds.   Premature ventricular beats are seen representing roughly 1% of all recorded QRS complexes.  There were 2 episodes of nonsustained wide-complex tachycardia with a maximum length of 11 beats.   Several patient triggered recordings submitted for an Alysis and they do not appear to correlate with any major changes in rhythm or rate. Abnormal arrhythmia monitor due to the presence of longstanding persistent atrial flutter with generally good ventricular rate control.  There are no episodes of significant rapid ventricular response, although there are 2 very brief episodes of nonsustained wide-complex tachycardia (ventricular tachycardia versus aberrant conduction).  There are no pauses in excess of 5 seconds.  A few pauses of 3.0-3.3 seconds in duration were asymptomatic and occurred during expected sleep hours. Patch Wear Time:  13 days and 16 hours (2024-05-10T15:06:00-0400 to 2024-05-24T08:00:05-398) 2 Ventricular Tachycardia runs occurred, the run with the fastest interval lasting 11 beats with a max rate of 171 bpm (avg 157 bpm); the run with the fastest interval was also the longest. Atrial Flutter occurred continuously (100% burden), ranging from  36-110 bpm (avg of 55 bpm). 7 Pauses occurred, the longest lasting 3.3 secs (18 bpm). Ventricular Tachycardia was detected within +/- 45 seconds of symptomatic patient event(s). Isolated VEs were rare (<1.0%, 4841), VE Couplets were rare (<1.0%, 299), and VE Triplets were rare (<1.0%, 37). Previously Notified: MD notification criteria for First Documentation of Atrial Flutter met - notified Dr. Lisabeth Devoid on 08 Sep 2022 at 6:16  PM EDT (EC) and Slow Atrial Flutter met - notified Cally G., PA on 17 Sep 2022 at 9:24 AM EDT (AY).     IMPRESSION:  1. OSA (obstructive sleep apnea)   2. Essential hypertension   3. Typical atrial flutter (HCC)   4. Long term current use of anticoagulant     ASSESSMENT AND PLAN: Mr. Sar is a 87 year-old gentleman who is a history of atrial flutter and is status post cardioversion.  He developed recurrent flutter with spontaneous rate control and has remained asymptomatic followed by Dr. Royann Shivers.   He continues to be on anticoagulation with apixaban.  He was diagnosed with sleep apnea in 2016 and received a ResMed air sense 10 auto CPAP unit, currently set at 9 cm water pressure.  Over the last several years he is consistently been compliant with CPAP use.  Since April 2022,  his CPAP unit is no longer wireless due to upgrade to [employer].  His machine is now 85 years old.  He continues to be compliant with CPAP use and at his current 9 cm set pressure AHI is 0.6 from a download of November 7 through May 05, 2022.  At my evaluation in January 2024 he was doing well on therapy and he qualified for new machine.  He received a new ResMed AirSense 11 AutoSet unit on June 06, 2022.  His current machine is still set at a set pressure of 9 cm.  Compliance is excellent with 100% use and average use is 6 hours and 22 minutes.  His most recent download shows an AHI of 0.5.  However he does have mask leak.  He had been using nasal pillows.  In the office today I provided him with a new sample mask ResMed AirFit N30i which I believe he will tolerate much better.  I reiterated the importance of using CPAP through the nights entirety until he wakes up for good in the morning.  Blood pressure today is stable and on repeat me 124/74.  He continues to be on lisinopril 10 mg daily.  Atrial flutter rate is well-controlled with average rate in the mid 50s with variable block.  He is on Eliquis for anticoagulation.   He is on simvastatin for hyperlipidemia.  I will see him in May 2025 for follow-up evaluation.   Lennette Bihari, MD, [facility], ABSM Diplomate, American Board of Sleep Medicine  10/26/2022 1:39 PM

## 2022-10-25 NOTE — Patient Instructions (Signed)
Medication Instructions:  NO CHANGES  *If you need a refill on your cardiac medications before your next appointment, please call your pharmacy*   Follow-Up: At [facility], you and your health needs are our priority.  As part of our continuing mission to provide you with exceptional heart care, we have created designated Provider Care Teams.  These Care Teams include your primary Cardiologist (physician) and Advanced Practice Providers (APPs -  Physician Assistants and Nurse Practitioners) who all work together to provide you with the care you need, when you need it.  We recommend signing up for the patient portal called "MyChart".  Sign up information is provided on this After Visit Summary.  MyChart is used to connect with patients for Virtual Visits (Telemedicine).  Patients are able to view lab/test results, encounter notes, upcoming appointments, etc.  Non-urgent messages can be sent to your provider as well.   To learn more about what you can do with MyChart, go to [email or web address].    Your next appointment:    May 2025 with Dr. Tresa Endo   ** call in December 2024 for a visit

## 2022-10-26 ENCOUNTER — Encounter: Payer: Self-pay | Admitting: Cardiovascular Disease

## 2022-11-15 DIAGNOSIS — Z85828 Personal history of other malignant neoplasm of skin: Secondary | ICD-10-CM | POA: Diagnosis not present

## 2022-11-15 DIAGNOSIS — D485 Neoplasm of uncertain behavior of skin: Secondary | ICD-10-CM | POA: Diagnosis not present

## 2022-11-15 DIAGNOSIS — L821 Other seborrheic keratosis: Secondary | ICD-10-CM | POA: Diagnosis not present

## 2022-11-15 DIAGNOSIS — D1801 Hemangioma of skin and subcutaneous tissue: Secondary | ICD-10-CM | POA: Diagnosis not present

## 2022-11-15 DIAGNOSIS — L820 Inflamed seborrheic keratosis: Secondary | ICD-10-CM | POA: Diagnosis not present

## 2022-11-15 DIAGNOSIS — L812 Freckles: Secondary | ICD-10-CM | POA: Diagnosis not present

## 2022-11-15 DIAGNOSIS — L570 Actinic keratosis: Secondary | ICD-10-CM | POA: Diagnosis not present

## 2022-11-15 DIAGNOSIS — C44729 Squamous cell carcinoma of skin of left lower limb, including hip: Secondary | ICD-10-CM | POA: Diagnosis not present

## 2022-11-22 ENCOUNTER — Other Ambulatory Visit: Payer: Self-pay

## 2022-11-22 MED ORDER — ALLOPURINOL 100 MG PO TABS: ORAL_TABLET | ORAL | 5 refills | Status: DC

## 2022-12-01 ENCOUNTER — Ambulatory Visit (HOSPITAL_COMMUNITY): Payer: Medicare HMO | Attending: Cardiology

## 2022-12-01 DIAGNOSIS — I3139 Other pericardial effusion (noninflammatory): Secondary | ICD-10-CM | POA: Diagnosis not present

## 2022-12-01 LAB — ECHOCARDIOGRAM LIMITED: Area-P 1/2: 4.89 cm2

## 2022-12-05 NOTE — Progress Notes (Signed)
Cardiology Office Note   Date:  12/06/2022  ID:  Gary [name], DOB [birth year], MRN [id number] PCP:  Kristian Covey, MD [facility] Cardiologist: Thurmon Fair, MD  Reason for visit: 2 month follow-up  History of Present Illness    Gary [name] is a 87 y.o. male with a hx of atrial flutter with intermittent low ventricular rate, sleep apnea on CPAP, hyperlipidemia, hypertension.   He was admitted to the hospital May 9 to Sep 11, 2022 for with brief episode of syncope when having dinner with friends on the 1 year anniversary of his wife's death. He denied prodromal symptoms.  On EMS arrival, he was bradycardic in the 40s and hypotensive with systolics in the 70s.  In the ER he had A-fib with heart rate 60.  CT of the head unremarkable.  Echo showed large pericardial effusion.  RHC on 5/13: normal LV systolic function and no compromise/tamponade.    I saw him in follow-up on October 05, 2022 when he stated this was his first and only syncopal episode.  He felt well and continued working out with a trainer twice per week and doing yard work.  His last echo was 12/01/22 with stable large pericardial effusion without signs on tamponade.  Today, patient states he is doing well.  He continues to work with a Psychologist, educational twice a week.  Before the trainer comes, he does 5 to 8 minutes on the treadmill walking 3-3.2 mph at 1-2 incline level which may make him slightly short of breath.  Otherwise he denies shortness of breath during his workout or with his daily activities.  He continues to do yard work and may rest on the bench in between tasks, no recent change.  He denies chest pain, PND, orthopnea and bleeding issues.  He has intermittent mild ankle edema.  He continues CPAP use.      Objective / Physical Exam   Vital signs:  BP 132/70 (BP Location: Left Arm, Patient Position: Sitting, Cuff Size: Normal)   Pulse 67   Ht 6\' 3"  (1.905 m)   Wt 251 lb 6.4 oz (114 kg)   SpO2 94%   BMI 31.42  kg/m     GEN: No acute distress NECK: No carotid bruits CARDIAC: RRR, no murmurs RESPIRATORY:  Clear to auscultation without rales, wheezing or rhonchi  EXTREMITIES: No edema  Assessment and Plan   Large pericardial effusion -No chest pain to suggest pericarditis -CRP/Sed rate WNL, TSH WNL -No evidence of tamponade on RHC in May 2024, echo May 2024/August 2024 -Per Dr. Royann Shivers, recheck echo in 2 months to follow-up pericardial effusion.  He knows to call us if he develops shortness of breath, syncope/near syncope or other new complaint.  Syncope -08/2022 on wife's 1 year anniversary of passing away, no prodrome.   -Inpatient 08/2022 tele: high 30s at night, typically in the 50s during the day. No significant pauses seen.  -Echo 08/2022: large pericardial effusion without evidence of overt tamponade --> stable on last echo 12/01/22 -?component of hypovolemia or possible vagal response contributing to his syncopal event.  -Zio patch 08/2022 with baseline rhythm of atrial flutter with variable AV block, average heart rate 55, nocturnal pauses maxing at 3.3 seconds, 2 NSVT runs August lasting 11 beats. -Continue adequate hydration   Atrial flutter/fibrillation, asymptomatic -Continue Eliquis 5 mg twice daily; no bleeding issues   Hypertension, well controlled -Continue lisinopril 20 mg daily. -Allow some permissive hypertension given history of syncope.   Hyperlipidemia -  Lipids followed by PCP.     Disposition - Follow-up in December with Dr. Royann Shivers.  We will follow-up on echo in the meanwhile.  Patient aware to call us if any change in symptoms.   Signed, Cannon Kettle, PA-C  12/06/2022 [facility]

## 2022-12-06 ENCOUNTER — Ambulatory Visit: Payer: Medicare HMO | Attending: Physician Assistant | Admitting: Physician Assistant

## 2022-12-06 ENCOUNTER — Encounter: Payer: Self-pay | Admitting: Physician Assistant

## 2022-12-06 VITALS — BP 132/70 | HR 67 | Ht 75.0 in | Wt 251.4 lb

## 2022-12-06 DIAGNOSIS — I10 Essential (primary) hypertension: Secondary | ICD-10-CM | POA: Diagnosis not present

## 2022-12-06 DIAGNOSIS — Z7901 Long term (current) use of anticoagulants: Secondary | ICD-10-CM

## 2022-12-06 DIAGNOSIS — I3139 Other pericardial effusion (noninflammatory): Secondary | ICD-10-CM | POA: Diagnosis not present

## 2022-12-06 DIAGNOSIS — I483 Typical atrial flutter: Secondary | ICD-10-CM | POA: Diagnosis not present

## 2022-12-06 NOTE — Patient Instructions (Signed)
Medication Instructions:  No Changes *If you need a refill on your cardiac medications before your next appointment, please call your pharmacy*   Lab Work: No Labs If you have labs (blood work) drawn today and your tests are completely normal, you will receive your results only by: MyChart Message (if you have MyChart) OR A paper copy in the mail If you have any lab test that is abnormal or we need to change your treatment, we will call you to review the results.   Testing/Procedures: [address]. ( October 2024) Your physician has requested that you have an echocardiogram. Echocardiography is a painless test that uses sound waves to create images of your heart. It provides your doctor with information about the size and shape of your heart and how well your heart's chambers and valves are working. This procedure takes approximately one hour. There are no restrictions for this procedure. Please do NOT wear cologne, perfume, aftershave, or lotions (deodorant is allowed). Please arrive 15 minutes prior to your appointment time.    Follow-Up: At [facility], you and your health needs are our priority.  As part of our continuing mission to provide you with exceptional heart care, we have created designated Provider Care Teams.  These Care Teams include your primary Cardiologist (physician) and Advanced Practice Providers (APPs -  Physician Assistants and Nurse Practitioners) who all work together to provide you with the care you need, when you need it.  We recommend signing up for the patient portal called "MyChart".  Sign up information is provided on this After Visit Summary.  MyChart is used to connect with patients for Virtual Visits (Telemedicine).  Patients are able to view lab/test results, encounter notes, upcoming appointments, etc.  Non-urgent messages can be sent to your provider as well.   To learn more about what you can do with MyChart, go to  [email or web address].    Your next appointment:   December 2024  Provider:   Thurmon Fair, MD

## 2022-12-11 ENCOUNTER — Other Ambulatory Visit: Payer: Self-pay | Admitting: Family Medicine

## 2022-12-11 DIAGNOSIS — N400 Enlarged prostate without lower urinary tract symptoms: Secondary | ICD-10-CM

## 2022-12-27 ENCOUNTER — Other Ambulatory Visit (HOSPITAL_COMMUNITY): Payer: Medicare HMO

## 2023-01-31 ENCOUNTER — Ambulatory Visit (HOSPITAL_COMMUNITY): Payer: Medicare HMO | Attending: Cardiology

## 2023-01-31 DIAGNOSIS — I483 Typical atrial flutter: Secondary | ICD-10-CM | POA: Insufficient documentation

## 2023-01-31 DIAGNOSIS — Z7901 Long term (current) use of anticoagulants: Secondary | ICD-10-CM | POA: Insufficient documentation

## 2023-01-31 DIAGNOSIS — I10 Essential (primary) hypertension: Secondary | ICD-10-CM | POA: Insufficient documentation

## 2023-01-31 DIAGNOSIS — I3139 Other pericardial effusion (noninflammatory): Secondary | ICD-10-CM | POA: Insufficient documentation

## 2023-01-31 LAB — ECHOCARDIOGRAM COMPLETE
Area-P 1/2: 3.53 cm2
S' Lateral: 3.3 cm

## 2023-02-07 ENCOUNTER — Telehealth: Payer: Self-pay

## 2023-02-07 NOTE — Telephone Encounter (Addendum)
Called patient regarding results. Patient had understanding of results.----- Message from Cannon Kettle sent at 02/06/2023  8:08 PM EDT ----- Large pericardial effusion -unchanged from prior study.  No evidence of cardiac tamponade.

## 2023-02-19 ENCOUNTER — Encounter: Payer: Self-pay | Admitting: Family Medicine

## 2023-02-19 ENCOUNTER — Ambulatory Visit: Payer: Medicare HMO | Admitting: Family Medicine

## 2023-02-19 VITALS — BP 130/60 | HR 65 | Temp 97.7°F | Ht 74.02 in | Wt 251.7 lb

## 2023-02-19 DIAGNOSIS — Z79899 Other long term (current) drug therapy: Secondary | ICD-10-CM

## 2023-02-19 DIAGNOSIS — Z125 Encounter for screening for malignant neoplasm of prostate: Secondary | ICD-10-CM

## 2023-02-19 DIAGNOSIS — Z23 Encounter for immunization: Secondary | ICD-10-CM

## 2023-02-19 DIAGNOSIS — I483 Typical atrial flutter: Secondary | ICD-10-CM | POA: Diagnosis not present

## 2023-02-19 DIAGNOSIS — M1A9XX0 Chronic gout, unspecified, without tophus (tophi): Secondary | ICD-10-CM

## 2023-02-19 DIAGNOSIS — R7303 Prediabetes: Secondary | ICD-10-CM | POA: Diagnosis not present

## 2023-02-19 DIAGNOSIS — I10 Essential (primary) hypertension: Secondary | ICD-10-CM | POA: Diagnosis not present

## 2023-02-19 DIAGNOSIS — Z0000 Encounter for general adult medical examination without abnormal findings: Secondary | ICD-10-CM

## 2023-02-19 DIAGNOSIS — E7800 Pure hypercholesterolemia, unspecified: Secondary | ICD-10-CM | POA: Diagnosis not present

## 2023-02-19 LAB — LIPID PANEL
Cholesterol: 132 mg/dL (ref 0–200)
HDL: 36.9 mg/dL — ABNORMAL LOW (ref >39.00)
LDL Cholesterol: 80 mg/dL (ref 0–99)
NonHDL: 95.42
Total CHOL/HDL Ratio: 4
Triglycerides: 75 mg/dL (ref 0.0–149.0)
VLDL: 15 mg/dL (ref 0.0–40.0)

## 2023-02-19 LAB — PSA, MEDICARE: PSA: 1.71 ng/mL (ref 0.10–4.00)

## 2023-02-19 LAB — CBC WITH DIFFERENTIAL/PLATELET
Basophils Absolute: 0 10*3/uL (ref 0.0–0.1)
Basophils Relative: 0.4 % (ref 0.0–3.0)
Eosinophils Absolute: 0.1 10*3/uL (ref 0.0–0.7)
Eosinophils Relative: 1.9 % (ref 0.0–5.0)
HCT: 43 % (ref 39.0–52.0)
Hemoglobin: 13.9 g/dL (ref 13.0–17.0)
Lymphocytes Relative: 18 % (ref 12.0–46.0)
Lymphs Abs: 0.9 10*3/uL (ref 0.7–4.0)
MCHC: 32.4 g/dL (ref 30.0–36.0)
MCV: 100.6 fL — ABNORMAL HIGH (ref 78.0–100.0)
Monocytes Absolute: 0.6 10*3/uL (ref 0.1–1.0)
Monocytes Relative: 12.3 % — ABNORMAL HIGH (ref 3.0–12.0)
Neutro Abs: 3.5 10*3/uL (ref 1.4–7.7)
Neutrophils Relative %: 67.4 % (ref 43.0–77.0)
Platelets: 150 10*3/uL (ref 150.0–400.0)
RBC: 4.27 Mil/uL (ref 4.22–5.81)
RDW: 14.2 % (ref 11.5–15.5)
WBC: 5.2 10*3/uL (ref 4.0–10.5)

## 2023-02-19 LAB — COMPREHENSIVE METABOLIC PANEL
ALT: 11 U/L (ref 0–53)
AST: 16 U/L (ref 0–37)
Albumin: 3.8 g/dL (ref 3.5–5.2)
Alkaline Phosphatase: 73 U/L (ref 39–117)
BUN: 35 mg/dL — ABNORMAL HIGH (ref 6–23)
CO2: 26 meq/L (ref 19–32)
Calcium: 8.8 mg/dL (ref 8.4–10.5)
Chloride: 106 meq/L (ref 96–112)
Creatinine, Ser: 1.42 mg/dL (ref 0.40–1.50)
GFR: 45.24 mL/min — ABNORMAL LOW (ref >60.00)
Glucose, Bld: 97 mg/dL (ref 70–99)
Potassium: 4.5 meq/L (ref 3.5–5.1)
Sodium: 141 meq/L (ref 135–145)
Total Bilirubin: 0.9 mg/dL (ref 0.2–1.2)
Total Protein: 6.2 g/dL (ref 6.0–8.3)

## 2023-02-19 LAB — URIC ACID: Uric Acid, Serum: 6.4 mg/dL (ref 4.0–7.8)

## 2023-02-19 LAB — HEMOGLOBIN A1C: Hgb A1c MFr Bld: 6.2 % (ref 4.6–6.5)

## 2023-02-19 NOTE — Progress Notes (Signed)
Established Patient Office Visit  Subjective   Patient ID: Gary [name], male    DOB: October 08, 1935  Age: 87 y.o. MRN: [id number]  Chief Complaint  Patient presents with   Annual Exam    HPI   [name] is seen for physical exam.  He has history of hypertension, atrial flutter, obstructive sleep apnea, persistent pericardial effusion which is followed by cardiology, prediabetes, BPH, gout, hyperlipidemia.   Generally doing well.  Recent trip to [place] and also spent quite a bit of time at his beach place this year.  He recently had several elevated blood pressures and was taking lisinopril 10 mg daily regularly.  He added back amlodipine 5 mg daily about 10 days ago and blood pressure much improved since then.  No dizziness.  Had syncopal episode last summer but none since then.  Only rare episodes of dizziness with first standing.  Denies any recent gout flares.  Compliant with medications.  Had some recent weight gain with his travels and he thinks his blood pressure may have gone up a little bit related to that  Health maintenance reviewed:  Health Maintenance  Topic Date Due   COVID-19 Vaccine (3 - Pfizer risk series) 07/27/2019   HEMOGLOBIN A1C  11/24/2022   INFLUENZA VACCINE  11/30/2022   Colonoscopy  01/31/2023   OPHTHALMOLOGY EXAM  02/10/2023   Diabetic kidney evaluation - Urine ACR  05/27/2023   Medicare Annual Wellness (AWV)  05/27/2023   Diabetic kidney evaluation - eGFR measurement  09/11/2023   DTaP/Tdap/Td (3 - Td or Tdap) 05/13/2026   Pneumonia Vaccine 64+ Years old  Completed   Zoster Vaccines- Shingrix  Completed   HPV VACCINES  Aged Out   FOOT EXAM  Discontinued   -Still needs flu vaccine.  Other immunizations up to date  Family history and social history reviewed with no significant changes.  He is widowed.  9 grandchildren.  Past Medical History:  Diagnosis Date   Adenomatous colon polyp 06/2000   ALLERGIC RHINITIS 10/03/2007   Allergy    seasonal    Arthritis    knees   Atrial flutter (HCC) 06/26/2014   Cancer (HCC)    skin cancers removed in the past basal cell and squamous cell   CARDIAC MURMUR, AORTIC 10/03/2007   ERECTILE DYSFUNCTION 10/03/2007   High blood pressure 03/2016   HYPERLIPIDEMIA 10/03/2007   Rosacea    Sleep apnea    Past Surgical History:  Procedure Laterality Date   ADENOIDECTOMY     CARDIOVERSION N/A 07/01/2014   Procedure: CARDIOVERSION;  Surgeon: Thurmon Fair, MD;  Location: [facility] ENDOSCOPY;  Service: Cardiovascular;  Laterality: N/A;   COLONOSCOPY     PALATE SURGERY     RIGHT HEART CATH N/A 09/11/2022   Procedure: RIGHT HEART CATH;  Surgeon: Runell Gess, MD;  Location: [facility] INVASIVE CV LAB;  Service: Cardiovascular;  Laterality: N/A;   TEE WITHOUT CARDIOVERSION N/A 07/01/2014   Procedure: TRANSESOPHAGEAL ECHOCARDIOGRAM (TEE);  Surgeon: Thurmon Fair, MD;  Location: [facility] ENDOSCOPY;  Service: Cardiovascular;  Laterality: N/A;   TONSILLECTOMY     TOTAL KNEE ARTHROPLASTY Right 08/27/2017   Procedure: RIGHT TOTAL KNEE ARTHROPLASTY;  Surgeon: Dannielle Huh, MD;  Location: [facility] OR;  Service: Orthopedics;  Laterality: Right;    reports that he has quit smoking. He has never used smokeless tobacco. He reports current alcohol use of about 8.0 standard drinks of alcohol per week. He reports that he does not use drugs. family history includes CAD in  his brother; Cancer in his father; Diabetes in his mother; Hypotension in his sister; Liver cancer in his father; Stomach cancer in his paternal aunt. No Known Allergies  Review of Systems  Constitutional:  Negative for chills, fever, malaise/fatigue and weight loss.  Eyes:  Negative for blurred vision.  Respiratory:  Negative for shortness of breath.   Cardiovascular:  Negative for chest pain.  Gastrointestinal:  Negative for abdominal pain.  Genitourinary:  Negative for dysuria.  Neurological:  Negative for dizziness, weakness and headaches.      Objective:     BP 130/60  (BP Location: Left Arm, Patient Position: Sitting, Cuff Size: Large)   Pulse 65   Temp 97.7 F (36.5 C) (Oral)   Ht 6' 2.02" (1.88 m)   Wt 251 lb 11.2 oz (114.2 kg)   SpO2 98%   BMI 32.30 kg/m  BP Readings from Last 3 Encounters:  02/19/23 130/60  12/06/22 132/70  10/25/22 134/82   Wt Readings from Last 3 Encounters:  02/19/23 251 lb 11.2 oz (114.2 kg)  12/06/22 251 lb 6.4 oz (114 kg)  10/25/22 255 lb 6.4 oz (115.8 kg)      Physical Exam Vitals reviewed.  Constitutional:      General: He is not in acute distress.    Appearance: He is well-developed.  HENT:     Head: Normocephalic and atraumatic.     Right Ear: External ear normal.     Left Ear: External ear normal.  Eyes:     Conjunctiva/sclera: Conjunctivae normal.     Pupils: Pupils are equal, round, and reactive to light.  Neck:     Thyroid: No thyromegaly.  Cardiovascular:     Rate and Rhythm: Normal rate and regular rhythm.     Heart sounds: Normal heart sounds. No murmur heard. Pulmonary:     Effort: No respiratory distress.     Breath sounds: No wheezing or rales.  Abdominal:     General: Bowel sounds are normal. There is no distension.     Palpations: Abdomen is soft. There is no mass.     Tenderness: There is no abdominal tenderness. There is no guarding or rebound.  Musculoskeletal:     Cervical back: Normal range of motion and neck supple.  Lymphadenopathy:     Cervical: No cervical adenopathy.  Skin:    Comments: Right upper leg reveals nodular lesion with hyperkeratotic center which is about 1 cm diameter.  Strongly suspicious for squamous cell carcinoma.  He already has scheduled follow-up with dermatology to have this removed in December  Neurological:     Mental Status: He is alert and oriented to person, place, and time.     Cranial Nerves: No cranial nerve deficit.     Deep Tendon Reflexes: Reflexes normal.      No results found for any visits on 02/19/23.    The ASCVD Risk score  (Arnett DK, et al., 2019) failed to calculate for the following reasons:   The 2019 ASCVD risk score is only valid for ages 56 to 75    Assessment & Plan:   Problem List Items Addressed This Visit       Unprioritized   Typical atrial flutter (HCC)   Relevant Medications   amLODipine (NORVASC) 5 MG tablet   Essential hypertension   Relevant Medications   amLODipine (NORVASC) 5 MG tablet   Hypercholesterolemia   Relevant Medications   amLODipine (NORVASC) 5 MG tablet   Other Relevant Orders   CMP  Lipid panel   Gout - Primary   Relevant Orders   Uric Acid   Other Visit Diagnoses     Prediabetes       Relevant Orders   Hemoglobin A1c   Prostate cancer screening       Relevant Orders   PSA, Medicare   High risk medication use       Relevant Orders   CBC with Differential/Platelet     He is here today for physical exam.  Multiple chronic problems as above.  Due for multiple labs as above.  We discussed several health maintenance issues as follows  -Recommend flu vaccine and patient consents -Other vaccines up-to-date -Recheck A1c with prior history of prediabetes -We discussed the fact that PSA testing is generally not recommended after 70 but he still requests this after discussing pros and cons of testing -Continue close follow-up with cardiology and dermatology.  He does have skin lesion today right leg strongly suspicious for squamous cell carcinoma.  Already scheduled for biopsy. -Home blood pressures have been up recently and now improved after adding back amlodipine 5 mg daily.  Continue lisinopril 10 mg daily and amlodipine 5 mg daily and close monitoring.  No follow-ups on file.    Evelena Peat, MD

## 2023-02-22 DIAGNOSIS — H35361 Drusen (degenerative) of macula, right eye: Secondary | ICD-10-CM | POA: Diagnosis not present

## 2023-02-22 DIAGNOSIS — H43813 Vitreous degeneration, bilateral: Secondary | ICD-10-CM | POA: Diagnosis not present

## 2023-02-22 DIAGNOSIS — H34832 Tributary (branch) retinal vein occlusion, left eye, with macular edema: Secondary | ICD-10-CM | POA: Diagnosis not present

## 2023-02-22 DIAGNOSIS — H3582 Retinal ischemia: Secondary | ICD-10-CM | POA: Diagnosis not present

## 2023-02-22 DIAGNOSIS — H35033 Hypertensive retinopathy, bilateral: Secondary | ICD-10-CM | POA: Diagnosis not present

## 2023-03-20 DIAGNOSIS — L820 Inflamed seborrheic keratosis: Secondary | ICD-10-CM | POA: Diagnosis not present

## 2023-03-20 DIAGNOSIS — L821 Other seborrheic keratosis: Secondary | ICD-10-CM | POA: Diagnosis not present

## 2023-03-20 DIAGNOSIS — D485 Neoplasm of uncertain behavior of skin: Secondary | ICD-10-CM | POA: Diagnosis not present

## 2023-03-20 DIAGNOSIS — C44722 Squamous cell carcinoma of skin of right lower limb, including hip: Secondary | ICD-10-CM | POA: Diagnosis not present

## 2023-03-20 DIAGNOSIS — L812 Freckles: Secondary | ICD-10-CM | POA: Diagnosis not present

## 2023-03-20 DIAGNOSIS — Z85828 Personal history of other malignant neoplasm of skin: Secondary | ICD-10-CM | POA: Diagnosis not present

## 2023-03-20 DIAGNOSIS — L570 Actinic keratosis: Secondary | ICD-10-CM | POA: Diagnosis not present

## 2023-03-20 DIAGNOSIS — C44729 Squamous cell carcinoma of skin of left lower limb, including hip: Secondary | ICD-10-CM | POA: Diagnosis not present

## 2023-04-12 DIAGNOSIS — J209 Acute bronchitis, unspecified: Secondary | ICD-10-CM | POA: Diagnosis not present

## 2023-04-17 ENCOUNTER — Other Ambulatory Visit: Payer: Self-pay | Admitting: Family Medicine

## 2023-04-17 DIAGNOSIS — N400 Enlarged prostate without lower urinary tract symptoms: Secondary | ICD-10-CM

## 2023-04-18 ENCOUNTER — Ambulatory Visit: Payer: Medicare HMO | Admitting: Cardiovascular Disease

## 2023-04-18 ENCOUNTER — Other Ambulatory Visit: Payer: Self-pay | Admitting: Family Medicine

## 2023-05-30 ENCOUNTER — Ambulatory Visit: Payer: Medicare HMO

## 2023-05-30 VITALS — Ht 75.0 in | Wt 245.0 lb

## 2023-05-30 DIAGNOSIS — Z0000 Encounter for general adult medical examination without abnormal findings: Secondary | ICD-10-CM

## 2023-05-30 NOTE — Progress Notes (Signed)
Subjective:   Gary [name] is a 88 y.o. male who presents for Medicare Annual/Subsequent preventive examination.  Visit Complete: Virtual I connected with  Gary [name] on 05/30/23 by a audio enabled telemedicine application and verified that I am speaking with the correct person using two identifiers.  Patient Location: Home  Provider Location: Office/Clinic  I discussed the limitations of evaluation and management by telemedicine. The patient expressed understanding and agreed to proceed.  Vital Signs: Because this visit was a virtual/telehealth visit, some criteria may be missing or patient reported. Any vitals not documented were not able to be obtained and vitals that have been documented are patient reported. Cardiac Risk Factors include: advanced age (>101men, >54 women);hypertension;dyslipidemia;obesity (BMI >30kg/m2);male gender    Objective:    Today's Vitals   05/30/23 1025  Weight: 245 lb (111.1 kg)  Height: 6\' 3"  (1.905 m)   Body mass index is 30.62 kg/m.     05/30/2023   11:00 AM 09/08/2022    6:27 AM 09/07/2022   11:08 PM 05/26/2022   11:25 AM 05/20/2021    8:25 AM 02/27/2021    3:00 PM 06/04/2020    2:16 PM  Advanced Directives  Does Patient Have a Medical Advance Directive? Yes Yes Yes Yes Yes Yes Yes  Type of Estate agent of [place];Living will Living will;Healthcare Power of Attorney Out of facility DNR (pink MOST or yellow form) Healthcare Power of [place];Living will Healthcare Power of [place];Living will Healthcare Power of [place];Living will Living will;Healthcare Power of Attorney  Does patient want to make changes to medical advance directive?  No - Patient declined   No - Patient declined    Copy of Healthcare Power of Attorney in Chart? No - copy requested   No - copy requested No - copy requested  Yes - validated most recent copy scanned in chart (See row information)    Current Medications (verified) Outpatient  Encounter Medications as of 05/30/2023  Medication Sig   allopurinol (ZYLOPRIM) 100 MG tablet TAKE 2 TABLETS EVERY DAY   co-enzyme Q-10 30 MG capsule Take 30 mg by mouth every Monday, Wednesday, and Friday.   ELIQUIS 5 MG TABS tablet TAKE 1 TABLET TWICE DAILY   fluticasone (FLONASE) 50 MCG/ACT nasal spray PLACE 1 SPRAY INTO BOTH NOSTRILS EVERY OTHER DAY.   lisinopril (ZESTRIL) 10 MG tablet Take 1 tablet (10 mg total) by mouth daily.   minocycline (MINOCIN,DYNACIN) 100 MG capsule Take 100 mg by mouth every Monday, Wednesday, and Friday.   Multiple Vitamin (MULTIVITAMIN) tablet Take 1 tablet by mouth daily.   simvastatin (ZOCOR) 20 MG tablet Take 1 tablet (20 mg total) by mouth every other day.   tamsulosin (FLOMAX) 0.4 MG CAPS capsule TAKE 1 CAPSULE EVERY DAY   [DISCONTINUED] amLODipine (NORVASC) 5 MG tablet Take 5 mg by mouth daily.   No facility-administered encounter medications on file as of 05/30/2023.    Allergies (verified) Patient has no allergy information on record.   History: Past Medical History:  Diagnosis Date   Adenomatous colon polyp 06/2000   ALLERGIC RHINITIS 10/03/2007   Allergy    seasonal   Arthritis    knees   Atrial flutter (HCC) 06/26/2014   Cancer (HCC)    skin cancers removed in the past basal cell and squamous cell   CARDIAC MURMUR, AORTIC 10/03/2007   ERECTILE DYSFUNCTION 10/03/2007   High blood pressure 03/2016   HYPERLIPIDEMIA 10/03/2007   Rosacea    Sleep apnea  Past Surgical History:  Procedure Laterality Date   ADENOIDECTOMY     CARDIOVERSION N/A 07/01/2014   Procedure: CARDIOVERSION;  Surgeon: Thurmon Fair, MD;  Location: [facility] ENDOSCOPY;  Service: Cardiovascular;  Laterality: N/A;   COLONOSCOPY     PALATE SURGERY     RIGHT HEART CATH N/A 09/11/2022   Procedure: RIGHT HEART CATH;  Surgeon: Runell Gess, MD;  Location: [facility] INVASIVE CV LAB;  Service: Cardiovascular;  Laterality: N/A;   TEE WITHOUT CARDIOVERSION N/A 07/01/2014   Procedure:  TRANSESOPHAGEAL ECHOCARDIOGRAM (TEE);  Surgeon: Thurmon Fair, MD;  Location[facility] ENDOSCOPY;  Service: Cardiovascular;  Laterality: N/A;   TONSILLECTOMY     TOTAL KNEE ARTHROPLASTY Right 08/27/2017   Procedure: RIGHT TOTAL KNEE ARTHROPLASTY;  Surgeon: Dannielle Huh, MD;  Location: [facility] OR;  Service: Orthopedics;  Laterality: Right;   Family History  Problem Relation Age of Onset   Diabetes Mother    Cancer Father        liver ca   Liver cancer Father    Hypotension Sister    CAD Brother    Stomach cancer Paternal Aunt    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    Comment: retired  Tobacco Use   Smoking status: Former   Smokeless tobacco: Never  Vaping Use   Vaping status: Never Used  Substance and Sexual Activity   Alcohol use: Yes    Alcohol/week: 8.0 standard drinks of alcohol    Types: 8 Glasses of wine per week    Comment: ocassionally    Drug use: No   Sexual activity: Not Currently  Other Topics Concern   Not on file  Social History Narrative   Lives alone;wife deceased in the last year;two story house, but occupies main level mostly   Has 4 children, two biological, two stepchildren, 9 grandkids   One son local, supportive    Active playing golf with friends, goes to Systems analyst 2 days/week at [employer]   Enjoys reading, soduku   Social Drivers of Health   Financial Resource Strain: Low Risk  (05/30/2023)   Overall Financial Resource Strain (CARDIA)    Difficulty of Paying Living Expenses: Not hard at all  Food Insecurity: No Food Insecurity (05/30/2023)   Hunger Vital Sign    Worried About Running Out of Food in the Last Year: Never true    Ran Out of Food in the Last Year: Never true  Transportation Needs: No Transportation Needs  (05/30/2023)   PRAPARE - Administrator, Civil Service (Medical): No    Lack of Transportation (Non-Medical): No  Physical Activity: Insufficiently Active (05/30/2023)   Exercise Vital Sign    Days of Exercise per Week: 2 days    Minutes of Exercise per Session: 30 min  Stress: No Stress Concern Present (05/30/2023)   [employer] of Occupational Health - Occupational Stress Questionnaire    Feeling of Stress : Not at all  Social Connections: Socially Integrated (05/20/2021)   Social Connection and Isolation Panel [NHANES]    Frequency of Communication with Friends and Family: More than three times a week    Frequency of Social Gatherings with Friends and Family: More than three times a week    Attends Religious  Services: More than 4 times per year    Active Member of Clubs or Organizations: Yes    Attends Engineer, structural: More than 4 times per year    Marital Status: Married    Tobacco Counseling Counseling given: Not Answered   Clinical Intake:  Pre-visit preparation completed: Yes  Pain : No/denies pain     BMI - recorded: 30.62 Nutritional Status: BMI > 30  Obese Diabetes: No  How often do you need to have someone help you when you read instructions, pamphlets, or other written materials from your doctor or pharmacy?: 1 - Never  Interpreter Needed?: No  Information entered by :: Hassell Halim, CMA   Activities of Daily Living    05/30/2023   10:48 AM 09/08/2022    6:27 AM  In your present state of health, do you have any difficulty performing the following activities:  Hearing? 0 0  Vision? 0 0  Difficulty concentrating or making decisions? 0 0  Walking or climbing stairs? 0 0  Dressing or bathing? 0 0  Doing errands, shopping? 0 0  Preparing Food and eating ? N   Using the Toilet? N   In the past six months, have you accidently leaked urine? N   Do you have problems with loss of bowel control? N   Managing your Medications? N    Managing your Finances? N   Housekeeping or managing your Housekeeping? N     Patient Care Team: Kristian Covey, MD as PCP - General (Family Medicine) Croitoru, Rachelle Hora, MD as PCP - Cardiology (Cardiology) Donzetta Starch, MD as Consulting Physician (Dermatology) Lennette Bihari, MD as Consulting Physician (Cardiology) Lucille Passy, MD as Referring Physician (Ophthalmology) Sherrill Raring, [facility] (Pharmacist)  Indicate any recent Medical Services you may have received from other than [facility] providers in the past year (date may be approximate).     Assessment:   This is a routine wellness examination for Phillipe.  Hearing/Vision screen Hearing Screening - Comments:: Denies hearing difficulties   Vision Screening - Comments:: Wears rx glasses - up to date with routine eye exams with  Dr Shea Evans.  Also sees Dr Madilyn Fireman with [facility]   Goals Addressed               This Visit's Progress     Weight (lb) < 200 lb (90.7 kg) (pt-stated)        Patient stated to continue to lose weight       Depression Screen    05/30/2023   11:03 AM 02/19/2023    8:26 AM 02/13/2022    8:43 AM 08/22/2021    2:55 PM 05/20/2021    8:19 AM 06/04/2020    2:18 PM 07/14/2019    8:34 AM  PHQ 2/9 Scores  PHQ - 2 Score 0 0 0 0 0 0 0  PHQ- 9 Score    0  0     Fall Risk    05/30/2023   10:58 AM 02/19/2023    8:26 AM 05/26/2022   11:12 AM 02/13/2022    8:43 AM 08/22/2021    2:55 PM  Fall Risk   Falls in the past year? 0 0 0 0 0  Number falls in past yr: 0 0 0 0 0  Injury with Fall? 0 0 0 0 0  Risk for fall due to : No Fall Risks No Fall Risks No Fall Risks No Fall Risks No Fall Risks  Follow up  Falls prevention discussed Falls evaluation completed Education provided;Falls prevention discussed Falls evaluation completed Falls evaluation completed    MEDICARE RISK AT HOME: Medicare Risk at Home Any stairs in or around the home?: Yes If so, are there any without handrails?: Yes Home free  of loose throw rugs in walkways, pet beds, electrical cords, etc?: Yes Adequate lighting in your home to reduce risk of falls?: Yes Life alert?: Yes (Apple watch) Use of a cane, walker or w/c?: No Grab bars in the bathroom?: No Shower chair or bench in shower?: Yes Elevated toilet seat or a handicapped toilet?: No  TIMED UP AND GO:  Was the test performed?  No    Cognitive Function:        05/30/2023   10:58 AM 05/26/2022   11:20 AM 05/20/2021    8:24 AM  6CIT Screen  What Year? 0 points 0 points 0 points  What month? 0 points 0 points 0 points  What time? 0 points 0 points 0 points  Count back from 20 0 points 0 points 0 points  Months in reverse 0 points 0 points 0 points  Repeat phrase 0 points 2 points 0 points  Total Score 0 points 2 points 0 points    Immunizations Immunization History  Administered Date(s) Administered   Fluad Quad(high Dose 65+) 01/17/2019, 01/26/2020, 02/02/2021, 02/13/2022   Fluad Trivalent(High Dose 65+) 02/19/2023   Influenza Split 01/22/2012   Influenza, High Dose Seasonal PF 03/13/2014, 05/31/2015, 03/14/2016, 03/18/2018   Influenza,inj,Quad PF,6+ Mos 04/15/2013   PFIZER(Purple Top)SARS-COV-2 Vaccination 06/02/2019, 06/29/2019   PNEUMOCOCCAL CONJUGATE-20 02/02/2021   Pneumococcal Conjugate-13 04/15/2013, 05/12/2014   Pneumococcal Polysaccharide-23 08/19/2004, 10/19/2009   Td 08/19/2004   Tdap 05/13/2016   Zoster Recombinant(Shingrix) 10/26/2019, 01/12/2020    TDAP status: Up to date  Flu Vaccine status: Up to date  Pneumococcal vaccine status: Up to date  Covid-19 vaccine status: Declined, Education has been provided regarding the importance of this vaccine but patient still declined. Advised may receive this vaccine at local pharmacy or Health Dept.or vaccine clinic. Aware to provide a copy of the vaccination record if obtained from local pharmacy or Health Dept. Verbalized acceptance and understanding.  Qualifies for Shingles  Vaccine? Yes   Zostavax completed Yes   Shingrix Completed?: Yes  Screening Tests Health Maintenance  Topic Date Due   COVID-19 Vaccine (3 - Pfizer risk series) 07/27/2019   Colonoscopy  01/31/2023   OPHTHALMOLOGY EXAM  02/10/2023   Diabetic kidney evaluation - Urine ACR  05/27/2023   HEMOGLOBIN A1C  08/20/2023   Diabetic kidney evaluation - eGFR measurement  02/19/2024   Medicare Annual Wellness (AWV)  05/29/2024   DTaP/Tdap/Td (3 - Td or Tdap) 05/13/2026   Pneumonia Vaccine 73+ Years old  Completed   INFLUENZA VACCINE  Completed   Zoster Vaccines- Shingrix  Completed   HPV VACCINES  Aged Out   FOOT EXAM  Discontinued    Health Maintenance  Health Maintenance Due  Topic Date Due   COVID-19 Vaccine (3 - Pfizer risk series) 07/27/2019   Colonoscopy  01/31/2023   OPHTHALMOLOGY EXAM  02/10/2023   Diabetic kidney evaluation - Urine ACR  05/27/2023    Colorectal cancer screening: Type of screening: Colonoscopy. Completed 01/30/2018. Repeat every 5 years  Additional Screening:  Hepatitis C Screening: does not qualify  Vision Screening: Recommended annual ophthalmology exams for early detection of glaucoma and other disorders of the eye. Is the patient up to date with their annual eye exam?  Yes  Who is the provider or what is the name of the office in which the patient attends annual eye exams? Dr Madilyn Fireman If pt is not established with a provider, would they like to be referred to a provider to establish care? No .   Dental Screening: Recommended annual dental exams for proper oral hygiene  Community Resource Referral / Chronic Care Management: CRR required this visit?  No   CCM required this visit?  No     Plan:     I have personally reviewed and noted the following in the patient's chart:   Medical and social history Use of alcohol, tobacco or illicit drugs  Current medications and supplements including opioid prescriptions. Patient is not currently taking opioid  prescriptions. Functional ability and status Nutritional status Physical activity Advanced directives List of other physicians Hospitalizations, surgeries, and ER visits in previous 12 months Vitals Screenings to include cognitive, depression, and falls Referrals and appointments  In addition, I have reviewed and discussed with patient certain preventive protocols, quality metrics, and best practice recommendations. A written personalized care plan for preventive services as well as general preventive health recommendations were provided to patient.     Tillie Rung, LPN   [id number]   After Visit Summary: (MyChart) Due to this being a telephonic visit, the after visit summary with patients personalized plan was offered to patient via MyChart   Nurse Notes: none

## 2023-05-30 NOTE — Patient Instructions (Addendum)
Gary [name] , Thank you for taking time to come for your Medicare Wellness Visit. I appreciate your ongoing commitment to your health goals. Please review the following plan we discussed and let me know if I can assist you in the future.   Referrals/Orders/Follow-Ups/Clinician Recommendations: Continue to exercise. Aim for 30 minutes of exercise or brisk walking, 6-8 glasses of water, and 5 servings of fruits and vegetables each day.   This is a list of the screening recommended for you and due dates:  Health Maintenance  Topic Date Due   COVID-19 Vaccine (3 - Pfizer risk series) 07/27/2019   Colon Cancer Screening  01/31/2023   Eye exam for diabetics  02/10/2023   Yearly kidney health urinalysis for diabetes  05/27/2023   Hemoglobin A1C  08/20/2023   Yearly kidney function blood test for diabetes  02/19/2024   Medicare Annual Wellness Visit  05/29/2024   DTaP/Tdap/Td vaccine (3 - Td or Tdap) 05/13/2026   Pneumonia Vaccine  Completed   Flu Shot  Completed   Zoster (Shingles) Vaccine  Completed   HPV Vaccine  Aged Out   Complete foot exam   Discontinued    Advanced directives: (Copy Requested) Please bring a copy of your health care power of attorney and living will to the office to be added to your chart at your convenience.  Next Medicare Annual Wellness Visit scheduled for next year: Yes

## 2023-06-19 ENCOUNTER — Ambulatory Visit: Payer: Medicare HMO | Attending: Cardiovascular Disease | Admitting: Cardiovascular Disease

## 2023-06-19 VITALS — BP 112/56 | HR 48 | Ht 75.0 in | Wt 245.0 lb

## 2023-06-19 DIAGNOSIS — I483 Typical atrial flutter: Secondary | ICD-10-CM | POA: Diagnosis not present

## 2023-06-19 DIAGNOSIS — I10 Essential (primary) hypertension: Secondary | ICD-10-CM | POA: Diagnosis not present

## 2023-06-19 DIAGNOSIS — G4733 Obstructive sleep apnea (adult) (pediatric): Secondary | ICD-10-CM | POA: Diagnosis not present

## 2023-06-19 DIAGNOSIS — D6869 Other thrombophilia: Secondary | ICD-10-CM | POA: Diagnosis not present

## 2023-06-19 DIAGNOSIS — I3139 Other pericardial effusion (noninflammatory): Secondary | ICD-10-CM | POA: Diagnosis not present

## 2023-06-19 DIAGNOSIS — E785 Hyperlipidemia, unspecified: Secondary | ICD-10-CM | POA: Diagnosis not present

## 2023-06-19 NOTE — Progress Notes (Signed)
Cardiology Clinic Note   Patient Name: Gary [name] Date of Encounter: 06/19/2023  Primary Care Provider:  Kristian Covey, MD Primary Cardiologist:  Thurmon Fair, MD  Chief Complaint  Patient presents with   Pericardial Effusion   Atrial Flutter      Gary [name] 88 year old male presents to the clinic today for follow-up evaluation of large pericardial effusion, permanent atrial flutter with slow ventricular response, HTN, OSA on CPAP .  He has had a large pericardial effusion diagnosed during a hospitalization for syncope in 2024-06-08unclear etiology, no evidence of tamponade by right heart catheterization, markers for autoimmune disease.  He is doing quite well.  He continues to live independently (his wife passed away in 2021/10/06), goes to the gym on a regular basis and has no major complaints.  He does occasionally feel dizzy when he climbs stairs, but has not had presyncope or syncope since his hospitalization in 07-Oct-2022.  He is compliant with CPAP and denies daytime hypersomnolence.  He has not had recurrent syncope since his hospitalization last year.  He denies chest pain, orthopnea, PND.  He had some leg edema a few months ago, but he has paid more attention to his diet and has not had leg swelling in a long time.  He has not had any falls and denies bleeding problems on Eliquis.  He has not had gout attacks in years.  After his syncopal event and hospitalization he wore a 14-day monitor.  This showed persistent atrial flutter with an average ventricular rate of 55 bpm.  He had a handful of nocturnal pauses lasting up to 3.3 seconds, nonsymptomatic, none in excess of 5 seconds.  His metabolic profile is fair with diet controlled prediabetes (hemoglobin A1c 6.2%), mildly low HDL cholesterol 37 and favorable LDL at 80 on simvastatin.  He has normal triglycerides.  He does not have any known CAD or PAD  Past Medical History    Past Medical History:  Diagnosis Date    Adenomatous colon polyp 06/2000   ALLERGIC RHINITIS 10/03/2007   Allergy    seasonal   Arthritis    knees   Atrial flutter (HCC) 06/26/2014   Cancer (HCC)    skin cancers removed in the past basal cell and squamous cell   CARDIAC MURMUR, AORTIC 10/03/2007   ERECTILE DYSFUNCTION 10/03/2007   High blood pressure 03/2016   HYPERLIPIDEMIA 10/03/2007   Rosacea    Sleep apnea    Past Surgical History:  Procedure Laterality Date   ADENOIDECTOMY     CARDIOVERSION N/A 07/01/2014   Procedure: CARDIOVERSION;  Surgeon: Thurmon Fair, MD;  Location: [facility] ENDOSCOPY;  Service: Cardiovascular;  Laterality: N/A;   COLONOSCOPY     PALATE SURGERY     RIGHT HEART CATH N/A 09/11/2022   Procedure: RIGHT HEART CATH;  Surgeon: Runell Gess, MD;  Location: [facility] INVASIVE CV LAB;  Service: Cardiovascular;  Laterality: N/A;   TEE WITHOUT CARDIOVERSION N/A 07/01/2014   Procedure: TRANSESOPHAGEAL ECHOCARDIOGRAM (TEE);  Surgeon: Thurmon Fair, MD;  Location: [facility] ENDOSCOPY;  Service: Cardiovascular;  Laterality: N/A;   TONSILLECTOMY     TOTAL KNEE ARTHROPLASTY Right 08/27/2017   Procedure: RIGHT TOTAL KNEE ARTHROPLASTY;  Surgeon: Dannielle Huh, MD;  Location: [facility] OR;  Service: Orthopedics;  Laterality: Right;    Allergies  No known drug allergies  Home Medications    Prior to Admission medications   Medication Sig Start Date End Date Taking? Authorizing Provider  allopurinol (ZYLOPRIM)  100 MG tablet Take 2 tablets (200 mg total) by mouth daily. 02/07/21   Wynn Banker, MD  co-enzyme Q-10 30 MG capsule Take 30 mg by mouth every other day.     [provider]  ELIQUIS 5 MG TABS tablet TAKE 1 TABLET TWICE DAILY 01/17/21   Ankur Snowdon, MD  fluticasone (FLONASE) 50 MCG/ACT nasal spray Place 1 spray into both nostrils every other day. 02/16/20   Koberlein, Paris Lore, MD  lisinopril (ZESTRIL) 5 MG tablet Take 2 tablets (10 mg total) by mouth daily. 09/16/20   Lennette Bihari, MD  minocycline  (MINOCIN,DYNACIN) 100 MG capsule Take 100 mg by mouth every other day.     [provider]  Multiple Vitamin (MULTIVITAMIN) tablet Take 1 tablet by mouth daily.    [provider]  simvastatin (ZOCOR) 20 MG tablet Take 1 tablet (20 mg total) by mouth every other day. 09/16/20   Wynn Banker, MD  tamsulosin (FLOMAX) 0.4 MG CAPS capsule TAKE 1 CAPSULE EVERY DAY 03/23/20   Wynn Banker, MD    Family History    Family History  Problem Relation Age of Onset   Diabetes Mother    Cancer Father        liver ca   Liver cancer Father    Hypotension Sister    CAD Brother    Stomach cancer Paternal Aunt    He indicated that his mother is deceased. He indicated that his father is deceased. He indicated that his sister is alive. He indicated that his brother is alive. He indicated that his maternal grandmother is deceased. He indicated that his maternal grandfather is deceased. He indicated that his paternal grandmother is deceased. He indicated that his paternal grandfather is deceased. He indicated that the status of his paternal aunt is unknown.   Physical Exam    VS:  BP (!) 112/56 (BP Location: Left Arm, Patient Position: Sitting, Cuff Size: Large)   Pulse (!) 48   Ht 6\' 3"  (1.905 m)   Wt 245 lb (111.1 kg)   SpO2 97%   BMI 30.62 kg/m  , BMI Body mass index is 30.62 kg/m.    General: Alert, oriented x3, no distress, borderline obese Head: no evidence of trauma, PERRL, EOMI, no exophtalmos or lid lag, no myxedema, no xanthelasma; normal ears, nose and oropharynx Neck: normal jugular venous pulsations and no hepatojugular reflux; brisk carotid pulses without delay and no carotid bruits.  No evidence of Kussmaul sign. Chest: clear to auscultation, no signs of consolidation by percussion or palpation, normal fremitus, symmetrical and full respiratory excursions Cardiovascular: normal position and quality of the apical impulse, slow irregular rhythm, crisp first  and second heart sounds, no murmurs, rubs or gallops Abdomen: no tenderness or distention, no masses by palpation, no abnormal pulsatility or arterial bruits, normal bowel sounds, no hepatosplenomegaly Extremities: no clubbing, cyanosis or edema; 2+ radial, ulnar and brachial pulses bilaterally; 2+ right femoral, posterior tibial and dorsalis pedis pulses; 2+ left femoral, posterior tibial and dorsalis pedis pulses; no subclavian or femoral bruits Neurological: grossly nonfocal Psych: Normal mood and affect     Accessory Clinical Findings    Recent Labs: 09/08/2022: TSH 3.626 09/11/2022: Magnesium 1.9 02/19/2023: ALT 11; BUN 35; Creatinine, Ser 1.42; Hemoglobin 13.9; Platelets 150.0; Potassium 4.5; Sodium 141   Recent Lipid Panel    Component Value Date/Time   CHOL 132 02/19/2023 0909   TRIG 75.0 02/19/2023 0909   HDL 36.90 (L) 02/19/2023 4332  CHOLHDL 4 02/19/2023 0909   VLDL 15.0 02/19/2023 0909   LDLCALC 80 02/19/2023 0909   LDLCALC 83 01/26/2020 0913   LDLDIRECT 143.5 01/22/2012 0916  02/13/2022: Cholesterol 146; HDL 38.70; LDL Cholesterol 92; Triglycerides 77.0; VLDL 15.4  ECG personally reviewed by me today-10/25/2022 tracing shows atrial flutter with variable AV block and generalized low voltage QRS.   EKG Interpretation Date/Time:    Ventricular Rate:    PR Interval:    QRS Duration:    QT Interval:    QTC Calculation:   R Axis:      Text Interpretation:           Echocardiogram 01/31/2023   1. Left ventricular ejection fraction, by estimation, is 55 to 60%. The  left ventricle has normal function. The left ventricle has no regional  wall motion abnormalities. Left ventricular diastolic function could not  be evaluated due to atrial flutter.   2. Right ventricular systolic function is low normal. The right  ventricular size is mildly enlarged. There is moderately elevated  pulmonary artery systolic pressure. The estimated right ventricular  systolic  pressure is 59.9 mmHg.   3. Left atrial size was moderately dilated.   4. Right atrial size was severely dilated.   5. Large pericardial effusion. The pericardial effusion is  circumferential but mostly posterior.   6. The mitral valve is grossly normal. Mild mitral valve regurgitation.  No evidence of mitral stenosis.   7. Tricuspid valve regurgitation is moderate.   8. The aortic valve is tricuspid. Aortic valve regurgitation is trivial.  No aortic stenosis is present.   9. The inferior vena cava is dilated in size with <50% respiratory  variability, suggesting right atrial pressure of 15 mmHg.   Comparison(s): Prior study 12/01/2022 (limited): LVEF 60-65%, large  pericardial effusion located posteriorly and circumferential, no evidence  of cardiac tamponade, RVSP 39 mmHg, estimated RAP 15 mmHg.   Conclusion(s)/Recommendation(s): Current study does not illustrate right  atrial or right ventricular collapse, no clinically significant  respiratory variation acrros the mitral inflow pattern, note that these  parameters can be skewed in the setting of  pulmonary hypertension (RVSP on current study 59.67mmHG). No left atrial  collapse seen. Clinical correlation warranted.     Assessment & Plan   1. Pericardial effusion   2. Typical atrial flutter (HCC)   3. Acquired thrombophilia (HCC)   4. OSA (obstructive sleep apnea)   5. Dyslipidemia (high LDL; low HDL)   6. Essential hypertension        Large pericardial effusion: He has occasional dizziness climbing the stairs but is able to go to the gym without symptoms.  No evidence of tamponade on physical exam.  Etiology remains unclear.  No evidence of active inflammatory disease and negative ANA.  He has not undergone a chest CT.  Plan to repeat an echocardiogram.  If the effusion is still large he should undergo imaging with CT and will plan to check ESR and CRP again. Atrial flutter: Continues to slow ventricular response, but other  than the syncopal event a year ago this has been asymptomatic.  No evidence of severe bradycardia on the monitor last year.  We have discussed the fact that he will eventually need a pacemaker for progressive AV block, but he does not yet have symptoms to justify this.  He will call if he develops fatigue, frequent dizziness, recurrent syncope.  No history of stroke or other embolic events. CHADSVasc 3 (age, HTN).  Appropriately anticoagulated Eliquis:  No bleeding problems.  Compliant. OSA: CPAP conscientiously and denies daytime hypersomnolence.[name] HLP: No known atherosclerotic vascular disease.  Chronically low HDL, but pretty good LDL cholesterol on statin. HTN: Diastolic blood pressure is a little low today, but he reports that usually is in the 60s..  Continue the current medications, but consider cutting back on his lisinopril.      Patient Instructions  Medication Instructions:  Your physician recommends that you continue on your current medications as directed. Please refer to the Current Medication list given to you today.    *If you need a refill on your cardiac medications before your next appointment, please call your pharmacy*   Lab Work: None    If you have labs (blood work) drawn today and your tests are completely normal, you will receive your results only by: MyChart Message (if you have MyChart) OR A paper copy in the mail If you have any lab test that is abnormal or we need to change your treatment, we will call you to review the results.   Testing/Procedures: Echo will be scheduled at [facility] IN 2 MONTHS.  Your physician has requested that you have an echocardiogram. Echocardiography is a painless test that uses sound waves to create images of your heart. It provides your doctor with information about the size and shape of your heart and how well your heart's chambers and valves are working. This procedure takes approximately one hour. There are no  restrictions for this procedure. Please do NOT wear cologne, perfume, aftershave, or lotions (deodorant is allowed). Please arrive 15 minutes prior to your appointment time.    Follow-Up: At [facility], you and your health needs are our priority.  As part of our continuing mission to provide you with exceptional heart care, we have created designated Provider Care Teams.  These Care Teams include your primary Cardiologist (physician) and Advanced Practice Providers (APPs -  Physician Assistants and Nurse Practitioners) who all work together to provide you with the care you need, when you need it.  We recommend signing up for the patient portal called "MyChart".  Sign up information is provided on this After Visit Summary.  MyChart is used to connect with patients for Virtual Visits (Telemedicine).  Patients are able to view lab/test results, encounter notes, upcoming appointments, etc.  Non-urgent messages can be sent to your provider as well.   To learn more about what you can do with MyChart, go to [email or web address].    Your next appointment:   1 year(s)  The format for your next appointment:   In Person  Provider:   Thurmon Fair, MD    Other Instructions  Thurmon Fair, MD, [facility] [phone] office [phone] pager

## 2023-06-19 NOTE — Patient Instructions (Signed)
Medication Instructions:  Your physician recommends that you continue on your current medications as directed. Please refer to the Current Medication list given to you today.    *If you need a refill on your cardiac medications before your next appointment, please call your pharmacy*   Lab Work: None    If you have labs (blood work) drawn today and your tests are completely normal, you will receive your results only by: MyChart Message (if you have MyChart) OR A paper copy in the mail If you have any lab test that is abnormal or we need to change your treatment, we will call you to review the results.   Testing/Procedures: Echo will be scheduled at [facility] [place] 2 MONTHS.  Your physician has requested that you have an echocardiogram. Echocardiography is a painless test that uses sound waves to create images of your heart. It provides your doctor with information about the size and shape of your heart and how well your heart's chambers and valves are working. This procedure takes approximately one hour. There are no restrictions for this procedure. Please do NOT wear cologne, perfume, aftershave, or lotions (deodorant is allowed). Please arrive 15 minutes prior to your appointment time.    Follow-Up: At [facility], you and your health needs are our priority.  As part of our continuing mission to provide you with exceptional heart care, we have created designated Provider Care Teams.  These Care Teams include your primary Cardiologist (physician) and Advanced Practice Providers (APPs -  Physician Assistants and Nurse Practitioners) who all work together to provide you with the care you need, when you need it.  We recommend signing up for the patient portal called "MyChart".  Sign up information is provided on this After Visit Summary.  MyChart is used to connect with patients for Virtual Visits (Telemedicine).  Patients are able to view lab/test results, encounter  notes, upcoming appointments, etc.  Non-urgent messages can be sent to your provider as well.   To learn more about what you can do with MyChart, go to [email or web address].    Your next appointment:   1 year(s)  The format for your next appointment:   In Person  Provider:   Thurmon Fair, MD    Other Instructions

## 2023-06-29 DIAGNOSIS — H35352 Cystoid macular degeneration, left eye: Secondary | ICD-10-CM | POA: Diagnosis not present

## 2023-07-03 ENCOUNTER — Telehealth: Payer: Self-pay | Admitting: Family Medicine

## 2023-07-03 DIAGNOSIS — Z1211 Encounter for screening for malignant neoplasm of colon: Secondary | ICD-10-CM

## 2023-07-03 NOTE — Telephone Encounter (Signed)
 Copied from CRM [id number]. Topic: Referral - Request for Referral >> Jul 03, 2023 12:47 PM [name] wrote: Patient [phone] is asking for colonoscopy routine screening referral, been over 5 years ago with Dr. Russella Dar, had a lot of polyps. Patient mention this to Dr. Caryl Never during his physical exam 02/19/23. Please advise and call back.

## 2023-07-04 NOTE — Telephone Encounter (Signed)
Patient informed referral was placed  

## 2023-07-04 NOTE — Addendum Note (Signed)
 Addended by: Christy Sartorius on: 07/04/2023 08:42 AM   Modules accepted: Orders

## 2023-07-04 NOTE — Telephone Encounter (Signed)
Referral placed and left message for the patient to return my call.  

## 2023-07-06 ENCOUNTER — Ambulatory Visit (INDEPENDENT_AMBULATORY_CARE_PROVIDER_SITE_OTHER): Admitting: Family Medicine

## 2023-07-06 ENCOUNTER — Encounter: Payer: Self-pay | Admitting: Family Medicine

## 2023-07-06 VITALS — BP 122/70 | HR 67 | Resp 16 | Ht 75.0 in | Wt 245.4 lb

## 2023-07-06 DIAGNOSIS — L03119 Cellulitis of unspecified part of limb: Secondary | ICD-10-CM | POA: Diagnosis not present

## 2023-07-06 DIAGNOSIS — R600 Localized edema: Secondary | ICD-10-CM

## 2023-07-06 MED ORDER — DOXYCYCLINE HYCLATE 100 MG PO TABS: 100.0000 mg | ORAL_TABLET | Freq: Two times a day (BID) | ORAL | 0 refills | Status: AC

## 2023-07-06 NOTE — Progress Notes (Signed)
 ACUTE VISIT Chief Complaint  Patient presents with   Cyst    On right calf, has been there about a week, redness under it going down towards ankle.    HPI: Mr.Gary [name] is a 88 y.o. male with a PMHx significant for atrial flutter on chronic anticoagulation, HTN, OSA, and thrombocytopenia among some, who is here today complaining of a tender"knot" on his right lower leg as described above.    Nodular lesion right calf he noticed about a week ago after bumping area against furniture a week ago. He says his trainer also noticed it yesterday and suggested he have it checked out.   Leg Pain  The incident occurred 5 to 7 days ago. The pain is mild. The pain has been Constant since onset. Pertinent negatives include no inability to bear weight, loss of motion, loss of sensation or muscle weakness. The symptoms are aggravated by palpation. He has tried nothing for the symptoms.   Endorses some redness and soreness in the area. He noted right lower extremity edema, which is unusual.  He does not believe it is growing. Currently on Eliquis 5 mg twice daily and reports compliance with taking medication.  He has not been taking anything for this problem.   Pertinent negatives include recent trauma, surgery, or recent travel where he was sitting for more than 2 hours.  Negative for fever, chills, change in appetite, chest pain, SOB, palpitations, or diaphoresis.  Planning on driving his grandson to the [employer].  Review of Systems  Constitutional:  Negative for activity change and unexpected weight change.  HENT:  Negative for mouth sores and sore throat.   Respiratory:  Negative for cough and wheezing.   Gastrointestinal:  Negative for abdominal pain, nausea and vomiting.  Genitourinary:  Negative for decreased urine volume, dysuria and hematuria.  Musculoskeletal:  Negative for gait problem and myalgias.  Skin:  Negative for rash.  Neurological:  Negative for syncope, weakness  and headaches.  See other pertinent positives and negatives in HPI.  Current Outpatient Medications on File Prior to Visit  Medication Sig Dispense Refill   allopurinol (ZYLOPRIM) 100 MG tablet TAKE 2 TABLETS EVERY DAY 120 tablet 5   co-enzyme Q-10 30 MG capsule Take 30 mg by mouth every Monday, Wednesday, and Friday.     ELIQUIS 5 MG TABS tablet TAKE 1 TABLET TWICE DAILY 180 tablet 3   fluticasone (FLONASE) 50 MCG/ACT nasal spray PLACE 1 SPRAY INTO BOTH NOSTRILS EVERY OTHER DAY. 16 g 3   lisinopril (ZESTRIL) 10 MG tablet Take 1 tablet (10 mg total) by mouth daily. 90 tablet 3   Multiple Vitamin (MULTIVITAMIN) tablet Take 1 tablet by mouth daily.     simvastatin (ZOCOR) 20 MG tablet Take 1 tablet (20 mg total) by mouth every other day. 45 tablet 3   tamsulosin (FLOMAX) 0.4 MG CAPS capsule TAKE 1 CAPSULE EVERY DAY 90 capsule 1   No current facility-administered medications on file prior to visit.    Past Medical History:  Diagnosis Date   Adenomatous colon polyp 06/2000   ALLERGIC RHINITIS 10/03/2007   Allergy    seasonal   Arthritis    knees   Atrial flutter (HCC) 06/26/2014   Cancer (HCC)    skin cancers removed in the past basal cell and squamous cell   CARDIAC MURMUR, AORTIC 10/03/2007   ERECTILE DYSFUNCTION 10/03/2007   High blood pressure 03/2016   HYPERLIPIDEMIA 10/03/2007   Rosacea    Sleep apnea  Not on File  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    Comment: retired  Tobacco Use   Smoking status: Former   Smokeless tobacco: Never  Vaping Use   Vaping status: Never Used  Substance and Sexual Activity   Alcohol use: Yes    Alcohol/week: 8.0 standard drinks of alcohol    Types: 8 Glasses of wine per week    Comment: ocassionally    Drug use: No    Sexual activity: Not Currently  Other Topics Concern   Not on file  Social History Narrative   Lives alone;wife deceased in the last year;two story house, but occupies main level mostly   Has 4 children, two biological, two stepchildren, 9 grandkids   One son local, supportive    Active playing golf with friends, goes to Systems analyst 2 days/week at [employer]   Enjoys reading, soduku   Social Drivers of Health   Financial Resource Strain: Low Risk  (05/30/2023)   Overall Financial Resource Strain (CARDIA)    Difficulty of Paying Living Expenses: Not hard at all  Food Insecurity: No Food Insecurity (05/30/2023)   Hunger Vital Sign    Worried About Running Out of Food in the Last Year: Never true    Ran Out of Food in the Last Year: Never true  Transportation Needs: No Transportation Needs (05/30/2023)   PRAPARE - Administrator, Civil Service (Medical): No    Lack of Transportation (Non-Medical): No  Physical Activity: Insufficiently Active (05/30/2023)   Exercise Vital Sign    Days of Exercise per Week: 2 days    Minutes of Exercise per Session: 30 min  Stress: No Stress Concern Present (05/30/2023)   [employer] of Occupational Health - Occupational Stress Questionnaire    Feeling of Stress : Not at all  Social Connections: Socially Integrated (05/20/2021)   Social Connection and Isolation Panel [NHANES]    Frequency of Communication with Friends and Family: More than three times a week    Frequency of Social Gatherings with Friends and Family: More than three times a week    Attends Religious Services: More than 4 times per year    Active Member of Clubs or Organizations: Yes    Attends Banker Meetings: More than 4 times per year    Marital Status: Married   Vitals:   07/06/23 1423  BP: 122/70  Pulse: 67  Resp: 16  SpO2: 97%   Body mass index is 30.67 kg/m.  Physical Exam Vitals and nursing note reviewed.  Constitutional:       General: He is not in acute distress.    Appearance: He is well-developed.  HENT:     Head: Normocephalic and atraumatic.  Eyes:     Conjunctiva/sclera: Conjunctivae normal.  Cardiovascular:     Rate and Rhythm: Normal rate. Rhythm irregular.     Pulses:          Dorsalis pedis pulses are 2+ on the right side and 2+ on the left side.     Heart sounds: No murmur heard.    Comments: Bilateral peri ankle edema R>L. Right LE erythema and local heat medial aspect of calf. Increased in calf circumference when compared with  LLE. There is no calf tenderness. LE varicose veins, bilateral. Pulmonary:     Effort: Pulmonary effort is normal. No respiratory distress.     Breath sounds: Normal breath sounds.  Musculoskeletal:     Right lower leg: 1+ Edema present.     Left lower leg: Edema (Trace,pitting) present.       Legs:  Skin:    General: Skin is warm.     Findings: No erythema or rash.  Neurological:     Mental Status: He is alert and oriented to person, place, and time.     Cranial Nerves: No cranial nerve deficit.     Gait: Gait normal.  Psychiatric:        Mood and Affect: Mood and affect normal.    ASSESSMENT AND PLAN:  Mr. Ledee was seen today for a knot on his right lower leg.   Lower extremity edema RLE distal nodular lesion (knot) above area of erythema could be a hematoma, it is not tender. Continue monitoring for changes.  We discussed possible etiologies of LE edema,he has a history of vein disease, which could explain symptoms. Having medial calf erythema and local heat, DVT is in the differential diagnosis as well as superficial phlebitis. He is on Eliquis 5 mg twice daily, states that he has not missed a dose, so the probability of this being DVT is low but it is not 0. Well's score 2. Placed order for stat LE VAS [place].  I called pt back after visit to let him know that we tried to arrange vas [place] of lower extremity to evaluate for DVT today but it was not  possible to do so.  We discussed options, including going to the ED now, not sure about waiting time. He has had symptoms for a week and hemodynamically stable, so recommend going to the ED tomorrow very early to be evaluated. He was clearly instructed about warning signs and he understands he needs to seek immediate medical attention if SOB,CP,palpitations,or diaphoresis among some. He voices understanding and agrees with plan.  -     VAS [place] LOWER EXTREMITY VENOUS (DVT); Future  Cellulitis of lower leg Will treat empirically for possible cellulitis with doxycycline 100 mg twice daily for 7 days. Lower extremity elevation. Instructed about warning signs.  -     Doxycycline Hyclate; Take 1 tablet (100 mg total) by mouth 2 (two) times daily for 7 days.  Dispense: 14 tablet; Refill: 0  Return if symptoms worsen or fail to improve.  I, Rolla Etienne Wierda, acting as a scribe for Johneric Mcfadden [place], MD., have documented all relevant documentation on the behalf of Sharne Linders [place], MD, as directed by  Annalia Metzger [place], MD while in the presence of Annamary Buschman [place], MD.   I, Bernadetta Roell [place], MD, have reviewed all documentation for this visit. The documentation on 07/06/23 for the exam, diagnosis, procedures, and orders are all accurate and complete.  Jerman Tinnon G. [place], MD  [facility] [place] office.

## 2023-07-06 NOTE — Patient Instructions (Addendum)
 A few things to remember from today's visit:  Lower extremity edema - Plan: VAS [place] LOWER EXTREMITY VENOUS (DVT). Cellulitis vs superficial phlebitis, or deep blood clot. We need to rule out blood clot, so we are working on having a test today.  If there is a blood clot you will need to go to the hospital.   Will treat for possible cellulitis while waiting on ultrasound. Stop Minocycline while taking Doxycycline. Lower extremity elevation. No changes in Eliquis.  If you need refills for medications you take chronically, please call your pharmacy. Do not use My Chart to request refills or for acute issues that need immediate attention. If you send a my chart message, it may take a few days to be addressed, specially if I am not in the office.  Please be sure medication list is accurate. If a new problem present, please set up appointment sooner than planned today.

## 2023-07-07 ENCOUNTER — Encounter (HOSPITAL_COMMUNITY): Payer: Self-pay | Admitting: Emergency Medicine

## 2023-07-07 ENCOUNTER — Emergency Department (HOSPITAL_BASED_OUTPATIENT_CLINIC_OR_DEPARTMENT_OTHER)

## 2023-07-07 ENCOUNTER — Other Ambulatory Visit: Payer: Self-pay

## 2023-07-07 ENCOUNTER — Emergency Department (HOSPITAL_COMMUNITY)
Admission: EM | Admit: 2023-07-07 | Discharge: 2023-07-07 | Disposition: A | Discharge status: Home / Self Care | Attending: Emergency Medicine | Admitting: Emergency Medicine

## 2023-07-07 DIAGNOSIS — M79604 Pain in right leg: Secondary | ICD-10-CM | POA: Diagnosis not present

## 2023-07-07 DIAGNOSIS — I10 Essential (primary) hypertension: Secondary | ICD-10-CM | POA: Insufficient documentation

## 2023-07-07 DIAGNOSIS — M79661 Pain in right lower leg: Secondary | ICD-10-CM | POA: Diagnosis not present

## 2023-07-07 DIAGNOSIS — Z7901 Long term (current) use of anticoagulants: Secondary | ICD-10-CM | POA: Insufficient documentation

## 2023-07-07 DIAGNOSIS — Z79899 Other long term (current) drug therapy: Secondary | ICD-10-CM | POA: Insufficient documentation

## 2023-07-07 DIAGNOSIS — I4891 Unspecified atrial fibrillation: Secondary | ICD-10-CM | POA: Insufficient documentation

## 2023-07-07 NOTE — ED Triage Notes (Signed)
 88 y/o male comes in c/o of possible RLE blood clot in his legs. PT saw his PCP yesterday afternoon and attempted to get an US done, but was unable to. PT has some swelling noted to the RLE calf area.

## 2023-07-07 NOTE — Discharge Instructions (Signed)
 As we discussed, your work-up in the ER was reassuring for acute findings. Your ultrasound was negative. Please complete the course of antibiotics prescribed to you as prescribed in entirety to treat any infection in the area. Keep your leg elevated when you are sitting to help with swelling. You can also try compression stockings as well.  Follow-up with your PCP at your earliest convenience.  Return if development of any new or worsening symptoms.

## 2023-07-07 NOTE — Progress Notes (Signed)
 RLE venous duplex has been completed.  Preliminary results given to Lurena Nida, PA-C  Results can be found under chart review under CV PROC. 07/07/2023 12:46 PM Ayomikun Starling RVT, RDMS

## 2023-07-07 NOTE — ED Notes (Signed)
 PT is out of the room will get vitals on return

## 2023-07-07 NOTE — ED Provider Notes (Signed)
 [place] EMERGENCY DEPARTMENT AT [facility] Provider Note   CSN: [id number] Arrival date & time: 07/07/23  1308     History  Chief Complaint  Patient presents with   Leg Pain    Gary [name] is a 88 y.o. male.  Patient with history of afib on Eliquis, hypertension presents today with complaints of right leg pain. He was seen by his pcp for same yesterday and prescribed doxycycline to cover for cellulitis sent here for [place] to rule out DVT. States symptoms began 5-7 days ago and pain has been persistent since then. Denies trauma. Notes that he feels a "knot" on the medial aspect of his right calf that is tender to touch. It has not changed since he first noticed it. He is able to walk without significant discomfort. No fevers or chills. No chest pain or shortness of breath. No history of blood clots. No recent travel or surgeries.  The history is provided by the patient. No language interpreter was used.  Leg Pain      Home Medications Prior to Admission medications   Medication Sig Start Date End Date Taking? Authorizing Provider  allopurinol (ZYLOPRIM) 100 MG tablet TAKE 2 TABLETS EVERY DAY 11/22/22   Burchette, Elberta Fortis, MD  co-enzyme Q-10 30 MG capsule Take 30 mg by mouth every Monday, Wednesday, and Friday.    [provider]  doxycycline (VIBRA-TABS) 100 MG tablet Take 1 tablet (100 mg total) by mouth 2 (two) times daily for 7 days. 07/06/23 07/13/23  [place], Betty G, MD  ELIQUIS 5 MG TABS tablet TAKE 1 TABLET TWICE DAILY 08/04/22   Croitoru, Mihai, MD  fluticasone (FLONASE) 50 MCG/ACT nasal spray PLACE 1 SPRAY INTO BOTH NOSTRILS EVERY OTHER DAY. 04/18/23   Burchette, Elberta Fortis, MD  lisinopril (ZESTRIL) 10 MG tablet Take 1 tablet (10 mg total) by mouth daily. 06/05/22   Croitoru, Mihai, MD  Multiple Vitamin (MULTIVITAMIN) tablet Take 1 tablet by mouth daily.    [provider]  simvastatin (ZOCOR) 20 MG tablet Take 1 tablet (20 mg total) by mouth every  other day. 09/21/22   Worthy Rancher B, FNP  tamsulosin (FLOMAX) 0.4 MG CAPS capsule TAKE 1 CAPSULE EVERY DAY 04/18/23   Burchette, Elberta Fortis, MD      Allergies    Patient has no known allergies.    Review of Systems   Review of Systems  Musculoskeletal:  Positive for myalgias.  All other systems reviewed and are negative.   Physical Exam Updated Vital Signs BP (!) 155/83 (BP Location: Right Arm)   Pulse 94   Temp 98.1 F (36.7 C)   Ht 6\' 3"  (1.905 m)   Wt 108.9 kg   SpO2 96%   BMI 30.00 kg/m  Physical Exam Vitals and nursing note reviewed.  Constitutional:      General: He is not in acute distress.    Appearance: Normal appearance. He is normal weight. He is not ill-appearing, toxic-appearing or diaphoretic.  HENT:     Head: Normocephalic and atraumatic.  Cardiovascular:     Rate and Rhythm: Normal rate.  Pulmonary:     Effort: Pulmonary effort is normal. No respiratory distress.  Musculoskeletal:        General: Normal range of motion.     Cervical back: Normal range of motion.  Skin:    General: Skin is warm and dry.     Comments: Trace nonpiting edema in bilateral ankles, slightly more prominent in the right  lower leg. Small area of tenderness with skin discoloration noted to the medial mid lower leg. No calf tenderness. DP and PT pulses intact and 2+. Ambulatory with steady gait. No palpable cord or obvious cellulitic changes.  No fluctuance or induration  Neurological:     General: No focal deficit present.     Mental Status: He is alert.  Psychiatric:        Mood and Affect: Mood normal.        Behavior: Behavior normal.     ED Results / Procedures / Treatments   Labs (all labs ordered are listed, but only abnormal results are displayed) Labs Reviewed - No data to display  EKG None  Radiology No results found.  Procedures Procedures    Medications Ordered in ED Medications - No data to display  ED Course/ Medical Decision Making/ A&P                                  Medical Decision Making  Patient presents today from his PCPs office for a DVT ultrasound of his right lower extremity.  He is afebrile, nontoxic-appearing, in no acute distress with reassuring vital signs.  Physical exam reveals a small area of tenderness to the medial anterior lower leg area.  DVT ultrasound ordered and obtained in the emergency department today was negative.  Did show some subcutaneous edema in the lower leg and ankle.  On my exam, this looks like a small hematoma which patient probably obtained without realizing due to being on Eliquis.  He has no signs or symptoms to suggest compartment syndrome.  His PCP already placed him on doxycycline empirically.  Recommend that he complete this course and follow-up closely with his PCP.  Also recommend RICE for symptomatic management.  He has no signs or symptoms to warrant additional evaluation with labs at this time.  Discussed same with patient is understanding agreement with this plan.  Evaluation and diagnostic testing in the emergency department does not suggest an emergent condition requiring admission or immediate intervention beyond what has been performed at this time.  Plan for discharge with close PCP follow-up.  Patient is understanding and amenable with plan, educated on red flag symptoms that would prompt immediate return.  Patient discharged in stable condition.  Final Clinical Impression(s) / ED Diagnoses Final diagnoses:  Right leg pain    Rx / DC Orders ED Discharge Orders     None     An After Visit Summary was printed and given to the patient.     Vear Clock 07/07/23 1201    Wynetta Fines, MD 07/07/23 [phone]

## 2023-07-09 ENCOUNTER — Telehealth: Payer: Self-pay | Admitting: Family Medicine

## 2023-07-09 NOTE — Telephone Encounter (Signed)
 Copied from CRM [id number]. Topic: Referral - Status >> Jul 06, 2023  4:18 PM Sim Boast F wrote: Reason for CRM: [name] called regarding order that was sent over to their office today, says patient has to have DVT done at the main [name]

## 2023-07-11 ENCOUNTER — Other Ambulatory Visit: Payer: Self-pay | Admitting: Cardiovascular Disease

## 2023-07-11 ENCOUNTER — Other Ambulatory Visit: Payer: Self-pay | Admitting: Family

## 2023-07-12 DIAGNOSIS — H3582 Retinal ischemia: Secondary | ICD-10-CM | POA: Diagnosis not present

## 2023-07-12 DIAGNOSIS — H35033 Hypertensive retinopathy, bilateral: Secondary | ICD-10-CM | POA: Diagnosis not present

## 2023-07-12 DIAGNOSIS — H34832 Tributary (branch) retinal vein occlusion, left eye, with macular edema: Secondary | ICD-10-CM | POA: Diagnosis not present

## 2023-07-12 DIAGNOSIS — H43813 Vitreous degeneration, bilateral: Secondary | ICD-10-CM | POA: Diagnosis not present

## 2023-07-12 DIAGNOSIS — H35361 Drusen (degenerative) of macula, right eye: Secondary | ICD-10-CM | POA: Diagnosis not present

## 2023-07-16 ENCOUNTER — Other Ambulatory Visit: Payer: Self-pay | Admitting: Cardiovascular Disease

## 2023-07-16 DIAGNOSIS — I483 Typical atrial flutter: Secondary | ICD-10-CM

## 2023-07-16 NOTE — Telephone Encounter (Signed)
 Prescription refill request for Eliquis received. Indication:aflutter Last office visit:2/25 Scr:1.42  10/24 Age: 88 Weight:108.9  kg  Prescription refilled

## 2023-07-23 DIAGNOSIS — L821 Other seborrheic keratosis: Secondary | ICD-10-CM | POA: Diagnosis not present

## 2023-07-23 DIAGNOSIS — Z85828 Personal history of other malignant neoplasm of skin: Secondary | ICD-10-CM | POA: Diagnosis not present

## 2023-07-23 DIAGNOSIS — C44722 Squamous cell carcinoma of skin of right lower limb, including hip: Secondary | ICD-10-CM | POA: Diagnosis not present

## 2023-07-23 DIAGNOSIS — D485 Neoplasm of uncertain behavior of skin: Secondary | ICD-10-CM | POA: Diagnosis not present

## 2023-07-23 DIAGNOSIS — L570 Actinic keratosis: Secondary | ICD-10-CM | POA: Diagnosis not present

## 2023-07-23 DIAGNOSIS — L812 Freckles: Secondary | ICD-10-CM | POA: Diagnosis not present

## 2023-07-24 ENCOUNTER — Telehealth: Payer: Self-pay

## 2023-07-24 NOTE — Telephone Encounter (Signed)
 Copied from CRM [id number]. Topic: Clinical - Request for Lab/Test Order >> Jul 24, 2023 12:17 PM Almira Coaster wrote: Reason for CRM: Patient is calling to follow up on a phone call from a month ago regarding an order and scheduling for a colonoscopy. Patient would like to know if this is something Dr.Burchette still wants patient to do since he has not heard anything in over a month.

## 2023-07-27 ENCOUNTER — Telehealth: Payer: Self-pay | Admitting: Gastroenterology

## 2023-07-27 NOTE — Telephone Encounter (Signed)
 Good morning Dr. Lavon Paganini,  Patient called regarding a referral we received for him to do a colonoscopy. The patient was established with Dr. Russella Dar and he would like to know if in fact does need another one due to age.   Please advise thank you.

## 2023-08-02 NOTE — Telephone Encounter (Signed)
 Agree, based on his age do not recommend surveillance colonoscopy but if he is having any active GI problems please schedule office visit to evaluate his symptoms and discuss possible colonoscopy if needed.  Thank you

## 2023-08-07 ENCOUNTER — Encounter: Payer: Self-pay | Admitting: Physician Assistant

## 2023-08-14 ENCOUNTER — Ambulatory Visit (HOSPITAL_COMMUNITY): Payer: Medicare HMO | Attending: Cardiology

## 2023-08-14 ENCOUNTER — Other Ambulatory Visit (HOSPITAL_COMMUNITY): Payer: Self-pay | Admitting: *Deleted

## 2023-08-14 ENCOUNTER — Encounter: Payer: Self-pay | Admitting: Cardiovascular Disease

## 2023-08-14 DIAGNOSIS — I3139 Other pericardial effusion (noninflammatory): Secondary | ICD-10-CM | POA: Insufficient documentation

## 2023-08-14 LAB — ECHOCARDIOGRAM COMPLETE
Area-P 1/2: 3.7 cm2
S' Lateral: 3.5 cm

## 2023-08-14 MED ORDER — COLCHICINE 0.6 MG PO TABS: 0.6000 mg | ORAL_TABLET | Freq: Every day | ORAL | 0 refills | Status: DC

## 2023-08-20 ENCOUNTER — Other Ambulatory Visit (HOSPITAL_COMMUNITY): Payer: Self-pay | Admitting: Cardiovascular Disease

## 2023-08-21 ENCOUNTER — Ambulatory Visit
Admission: RE | Admit: 2023-08-21 | Discharge: 2023-08-21 | Disposition: A | Discharge status: Home / Self Care | Source: Ambulatory Visit | Attending: Cardiovascular Disease | Admitting: Cardiovascular Disease

## 2023-08-21 DIAGNOSIS — I3139 Other pericardial effusion (noninflammatory): Secondary | ICD-10-CM

## 2023-08-21 DIAGNOSIS — J90 Pleural effusion, not elsewhere classified: Secondary | ICD-10-CM | POA: Diagnosis not present

## 2023-08-21 DIAGNOSIS — E041 Nontoxic single thyroid nodule: Secondary | ICD-10-CM | POA: Diagnosis not present

## 2023-08-21 DIAGNOSIS — I2510 Atherosclerotic heart disease of native coronary artery without angina pectoris: Secondary | ICD-10-CM | POA: Diagnosis not present

## 2023-08-27 ENCOUNTER — Encounter: Payer: Self-pay | Admitting: Cardiovascular Disease

## 2023-08-28 ENCOUNTER — Other Ambulatory Visit: Payer: Self-pay | Admitting: Emergency Medicine

## 2023-08-28 DIAGNOSIS — E041 Nontoxic single thyroid nodule: Secondary | ICD-10-CM

## 2023-09-03 ENCOUNTER — Telehealth: Payer: Self-pay | Admitting: Cardiovascular Disease

## 2023-09-03 NOTE — Telephone Encounter (Signed)
 Please have him come and see me this Wednesday.  I am opening up 1/2-day clinic that morning.

## 2023-09-03 NOTE — Telephone Encounter (Signed)
 Spoke with patient and he started colchicine  about 2 weeks ago. He has been getting dizzy the last week. He states his BP/HR has been normal. Last time he checked his BP 134/76.   He would like to discuss other options.   Advised when he is changing positions to change them slowly. Asymptomatic currently. ED precautions discussed. Will forward to provider

## 2023-09-03 NOTE — Telephone Encounter (Signed)
 Pt c/o medication issue:  1. Name of Medication:   colchicine  0.6 MG tablet   2. How are you currently taking this medication (dosage and times per day)?   As prescribed  3. Are you having a reaction (difficulty breathing--STAT)?   Dizziness  4. What is your medication issue?   Patient stated he recently started on this medication and for the last 10 days he has been having dizziness when standing up, climbing steps and lightheaded with strenuous activity.  Patient noted his BP and HR readings have been within range (134/76).

## 2023-09-04 ENCOUNTER — Ambulatory Visit
Admission: RE | Admit: 2023-09-04 | Discharge: 2023-09-04 | Disposition: A | Discharge status: Home / Self Care | Source: Ambulatory Visit | Attending: Cardiovascular Disease | Admitting: Cardiovascular Disease

## 2023-09-04 DIAGNOSIS — E041 Nontoxic single thyroid nodule: Secondary | ICD-10-CM | POA: Diagnosis not present

## 2023-09-04 NOTE — Telephone Encounter (Signed)
 Left message saying that Dr Alvis Ba would like for him to be seen tomorrow 09/05/23, if he is able to make it to an appointment. Left call back number. Sent MyChart message as well

## 2023-09-04 NOTE — Telephone Encounter (Signed)
 Left voicemail to return call to office.  Scheduling if patient return call please schedule him wit Dr. Tita Form tomorrow if he is available.

## 2023-09-05 ENCOUNTER — Ambulatory Visit: Attending: Cardiovascular Disease | Admitting: Cardiovascular Disease

## 2023-09-05 ENCOUNTER — Encounter: Payer: Self-pay | Admitting: Cardiovascular Disease

## 2023-09-05 VITALS — BP 150/78 | HR 51 | Ht 75.0 in | Wt 240.6 lb

## 2023-09-05 DIAGNOSIS — I3139 Other pericardial effusion (noninflammatory): Secondary | ICD-10-CM | POA: Diagnosis not present

## 2023-09-05 DIAGNOSIS — D6869 Other thrombophilia: Secondary | ICD-10-CM

## 2023-09-05 DIAGNOSIS — E785 Hyperlipidemia, unspecified: Secondary | ICD-10-CM

## 2023-09-05 DIAGNOSIS — I483 Typical atrial flutter: Secondary | ICD-10-CM

## 2023-09-05 DIAGNOSIS — I10 Essential (primary) hypertension: Secondary | ICD-10-CM | POA: Diagnosis not present

## 2023-09-05 DIAGNOSIS — G4733 Obstructive sleep apnea (adult) (pediatric): Secondary | ICD-10-CM

## 2023-09-05 NOTE — Patient Instructions (Signed)
 Medication Instructions:  Your physician recommends that you continue on your current medications as directed. Please refer to the Current Medication list given to you today.  *If you need a refill on your cardiac medications before your next appointment, please call your pharmacy*  Lab Work: TODAY ON THE FIRST FLOOR   If you have labs (blood work) drawn today and your tests are completely normal, you will receive your results only by: MyChart Message (if you have MyChart) OR A paper copy in the mail If you have any lab test that is abnormal or we need to change your treatment, we will call you to review the results.  Testing/Procedures: NONE  Follow-Up: At [facility], you and your health needs are our priority.  As part of our continuing mission to provide you with exceptional heart care, our providers are all part of one team.  This team includes your primary Cardiologist (physician) and Advanced Practice Providers or APPs (Physician Assistants and Nurse Practitioners) who all work together to provide you with the care you need, when you need it.  Your next appointment:   3 month(s)  Provider:   DR Alvis Ba   We recommend signing up for the patient portal called "MyChart".  Sign up information is provided on this After Visit Summary.  MyChart is used to connect with patients for Virtual Visits (Telemedicine).  Patients are able to view lab/test results, encounter notes, upcoming appointments, etc.  Non-urgent messages can be sent to your provider as well.   To learn more about what you can do with MyChart, go to [email or web address].

## 2023-09-05 NOTE — Progress Notes (Signed)
 Cardiology Clinic Note   Patient Name: Gary [name] Date of Encounter: 09/05/2023  Primary Care Provider:  Marquetta Sit, MD Primary Cardiologist:  Luana Rumple, MD  Chief Complaint  Patient presents with   Pericardial Effusion      Gary [name] 88 year old male presents to the clinic today for follow-up evaluation of large pericardial effusion, permanent atrial flutter with slow ventricular response, HTN, OSA on CPAP .  He has had a large pericardial effusion diagnosed during a hospitalization for syncope in May 2024, unclear etiology, no evidence of tamponade by right heart catheterization, markers for autoimmune disease.  On follow-up studies the pericardial effusion remains large.  The chest CT does not show any evidence of malignancy or lymphadenopathy.  The effusion appears to remain asymptomatic.  Recently he has noticed a little more dizziness if he stands up quickly, but he does not have shortness of breath and has not experienced syncope or near syncope (since the hospitalization in May 2024).  He does not have lower extremity edema.  He has never complained of chest discomfort.  He is compliant with CPAP and denies daytime hypersomnolence.  After his syncopal event and hospitalization he wore a 14-day monitor.  This showed persistent atrial flutter with an average ventricular rate of 55 bpm.  He had a handful of nocturnal pauses lasting up to 3.3 seconds, nonsymptomatic, none in excess of 5 seconds.  His metabolic profile remains quite good with a total cholesterol 132, HDL 37, LDL 80 and hemoglobin A1c 1.6%.  He has mildly abnormal kidney function with a creatinine most recently 1.42 , appears to be at baseline (GFR 45-50).  Past Medical History    Past Medical History:  Diagnosis Date   Adenomatous colon polyp 06/2000   ALLERGIC RHINITIS 10/03/2007   Allergy    seasonal   Arthritis    knees   Atrial flutter (HCC) 06/26/2014   Cancer (HCC)    skin cancers  removed in the past basal cell and squamous cell   CARDIAC MURMUR, AORTIC 10/03/2007   ERECTILE DYSFUNCTION 10/03/2007   High blood pressure 03/2016   HYPERLIPIDEMIA 10/03/2007   Rosacea    Sleep apnea    Past Surgical History:  Procedure Laterality Date   ADENOIDECTOMY     CARDIOVERSION N/A 07/01/2014   Procedure: CARDIOVERSION;  Surgeon: Luana Rumple, MD;  Location: [facility] ENDOSCOPY;  Service: Cardiovascular;  Laterality: N/A;   COLONOSCOPY     PALATE SURGERY     RIGHT HEART CATH N/A 09/11/2022   Procedure: RIGHT HEART CATH;  Surgeon: Avanell Leigh, MD;  [facility]AS[facility] CV LAB;  Service: Cardiovascular;  Laterality: N/A;   TEE WITHOUT CARDIOVERSION N/A 07/01/2014   Procedure: TRANSESOPHAGEAL ECHOCARDIOGRAM (TEE);  Surgeon: Luana Rumple, MD;  Location: [facility] ENDOSCOPY;  Service: Cardiovascular;  Laterality: N/A;   TONSILLECTOMY     TOTAL KNEE ARTHROPLASTY Right 08/27/2017   Procedure: RIGHT TOTAL KNEE ARTHROPLASTY;  Surgeon: Christie Cox, MD;  Location: [facility] OR;  Service: Orthopedics;  Laterality: Right;    Allergies  No known drug allergies  Home Medications    Prior to Admission medications   Medication Sig Start Date End Date Taking? Authorizing Provider  allopurinol  (ZYLOPRIM ) 100 MG tablet Take 2 tablets (200 mg total) by mouth daily. 02/07/21   Koberlein, Junell C, MD  co-enzyme Q-10 30 MG capsule Take 30 mg by mouth every other day.     [provider]  ELIQUIS  5 MG TABS tablet TAKE  1 TABLET TWICE DAILY 01/17/21   Jahnae Mcadoo, MD  fluticasone  (FLONASE ) 50 MCG/ACT nasal spray Place 1 spray into both nostrils every other day. 02/16/20   Koberlein, Junell C, MD  lisinopril  (ZESTRIL ) 5 MG tablet Take 2 tablets (10 mg total) by mouth daily. 09/16/20   Millicent Ally, MD  minocycline  (MINOCIN ,DYNACIN ) 100 MG capsule Take 100 mg by mouth every other day.     [provider]  Multiple Vitamin (MULTIVITAMIN) tablet Take 1 tablet by mouth daily.    [provider]  simvastatin  (ZOCOR ) 20 MG tablet Take 1 tablet (20 mg total) by mouth every other day. 09/16/20   Koberlein, Junell C, MD  tamsulosin  (FLOMAX ) 0.4 MG CAPS capsule TAKE 1 CAPSULE EVERY DAY 03/23/20   Koberlein, Junell C, MD    Family History    Family History  Problem Relation Age of Onset   Diabetes Mother    Cancer Father        liver ca   Liver cancer Father    Hypotension Sister    CAD Brother    Stomach cancer Paternal Aunt    He indicated that his mother is deceased. He indicated that his father is deceased. He indicated that his sister is alive. He indicated that his brother is alive. He indicated that his maternal grandmother is deceased. He indicated that his maternal grandfather is deceased. He indicated that his paternal grandmother is deceased. He indicated that his paternal grandfather is deceased. He indicated that the status of his paternal aunt is unknown.   Physical Exam    VS:  BP (!) 150/78 (BP Location: Left Arm, Patient Position: Sitting, Cuff Size: Normal)   Pulse (!) 51   Ht 6\' 3"  (1.905 m)   Wt 109.1 kg   SpO2 98%   BMI 30.07 kg/m  , BMI Body mass index is 30.07 kg/m.  General: Alert, oriented x3, no distress, appears well Head: no evidence of trauma, PERRL, EOMI, no exophtalmos or lid lag, no myxedema, no xanthelasma; normal ears, nose and oropharynx Neck: normal jugular venous pulsations and no hepatojugular reflux; brisk carotid pulses without delay and no carotid bruits Chest: clear to auscultation, no signs of consolidation by percussion or palpation, normal fremitus, symmetrical and full respiratory excursions Cardiovascular: normal position and quality of the apical impulse, irregular rhythm, normal nonmuffled first and second heart sounds, no murmurs, rubs or gallops Abdomen: no tenderness or distention, no masses by palpation, no abnormal pulsatility or arterial bruits, normal bowel sounds, no hepatosplenomegaly Extremities: no  clubbing, cyanosis or edema; 2+ radial, ulnar and brachial pulses bilaterally; 2+ right femoral, posterior tibial and dorsalis pedis pulses; 2+ left femoral, posterior tibial and dorsalis pedis pulses; no subclavian or femoral bruits Neurological: grossly nonfocal Psych: Normal mood and affect     Accessory Clinical Findings    Recent Labs: 09/08/2022: TSH 3.626 09/11/2022: Magnesium 1.9 02/19/2023: ALT 11; BUN 35; Creatinine, Ser 1.42; Hemoglobin 13.9; Platelets 150.0; Potassium 4.5; Sodium 141   Recent Lipid Panel    Component Value Date/Time   CHOL 132 02/19/2023 0909   TRIG 75.0 02/19/2023 0909   HDL 36.90 (L) 02/19/2023 0909   CHOLHDL 4 02/19/2023 0909   VLDL 15.0 02/19/2023 0909   LDLCALC 80 02/19/2023 0909   LDLCALC 83 01/26/2020 0913   LDLDIRECT 143.5 01/22/2012 0916  02/13/2022: Cholesterol 146; HDL 38.70; LDL Cholesterol 92; Triglycerides 77.0; VLDL 15.4  ECG    EKG Interpretation Date/Time:  Wednesday Sep 05 2023 11:25:15  EDT Ventricular Rate:  51 PR Interval:    QRS Duration:  92 QT Interval:  460 QTC Calculation: 423 R Axis:   -7  Text Interpretation: Atrial flutter with slow ventricular response Low voltage QRS Nonspecific ST abnormality When compared with ECG of 25-Oct-2022 09:00, No significant change since last tracing Confirmed by Kynedi Profitt ([id number]) on 09/05/2023 3:09:53 PM         Echocardiogram 01/31/2023   1. Left ventricular ejection fraction, by estimation, is 55 to 60%. The  left ventricle has normal function. The left ventricle has no regional  wall motion abnormalities. Left ventricular diastolic function could not  be evaluated due to atrial flutter.   2. Right ventricular systolic function is low normal. The right  ventricular size is mildly enlarged. There is moderately elevated  pulmonary artery systolic pressure. The estimated right ventricular  systolic pressure is 59.9 mmHg.   3. Left atrial size was moderately dilated.   4.  Right atrial size was severely dilated.   5. Large pericardial effusion. The pericardial effusion is  circumferential but mostly posterior.   6. The mitral valve is grossly normal. Mild mitral valve regurgitation.  No evidence of mitral stenosis.   7. Tricuspid valve regurgitation is moderate.   8. The aortic valve is tricuspid. Aortic valve regurgitation is trivial.  No aortic stenosis is present.   9. The inferior vena cava is dilated in size with <50% respiratory  variability, suggesting right atrial pressure of 15 mmHg.   Comparison(s): Prior study 12/01/2022 (limited): LVEF 60-65%, large  pericardial effusion located posteriorly and circumferential, no evidence  of cardiac tamponade, RVSP 39 mmHg, estimated RAP 15 mmHg.   Conclusion(s)/Recommendation(s): Current study does not illustrate right  atrial or right ventricular collapse, no clinically significant  respiratory variation acrros the mitral inflow pattern, note that these  parameters can be skewed in the setting of  pulmonary hypertension (RVSP on current study 59.18mmHG). No left atrial  collapse seen. Clinical correlation warranted.     Assessment & Plan   1. Pericardial effusion   2. Typical atrial flutter (HCC)   3. Acquired thrombophilia (HCC)   4. OSA (obstructive sleep apnea)   5. Dyslipidemia (high LDL; low HDL)   6. Essential hypertension        Large pericardial effusion: Appears to remain asymptomatic.  No clinical or echo evidence for tamponade.  Etiology uncertain.  Recheck inflammatory markers today.  Not able to easily approach this for pericardiocentesis since most of the fluid is posterior.  There is no compelling reason to proceed with pericardiocentesis or surgical window at this time.  Will try empirical treatment with colchicine .  Recheck a limited echo in 2 months. Atrial flutter: Current episodes of dizziness sound highly compatible with orthostatic hypotension, which could be more likely related  to his antihypertensive medications and the alpha-blocker he takes for prostatism.  Still, there is a good chance he will eventually need a pacemaker.  When he wore the arrhythmia monitor there was no evidence of severe bradycardia except for a single nocturnal 3.3-second pause.  He has not had syncope since May 2024.  No history of stroke or other embolic events. CHADSVasc 3 (age, HTN).  Appropriately anticoagulated Eliquis : No bleeding problems, compliant with anticoagulation. OSA: CPAP conscientiously and denies daytime hypersomnolence.Aaron Aas HLP: He has a chronically low HDL cholesterol but otherwise a favorable lipid profile.  He does not have established CAD or PAD but does have evidence of atherosclerotic calcifications on imaging studies.  Target LDL less than 100 seems acceptable. HTN: His blood pressure slightly high today, but usually much better at home.  Recent office appointment blood pressure was 122/70.  No changes made to his medications.      Patient Instructions  Medication Instructions:  Your physician recommends that you continue on your current medications as directed. Please refer to the Current Medication list given to you today.  *If you need a refill on your cardiac medications before your next appointment, please call your pharmacy*  Lab Work: TODAY ON THE FIRST FLOOR   If you have labs (blood work) drawn today and your tests are completely normal, you will receive your results only by: MyChart Message (if you have MyChart) OR A paper copy in the mail If you have any lab test that is abnormal or we need to change your treatment, we will call you to review the results.  Testing/Procedures: NONE  Follow-Up: At [facility], you and your health needs are our priority.  As part of our continuing mission to provide you with exceptional heart care, our providers are all part of one team.  This team includes your primary Cardiologist (physician) and Advanced  Practice Providers or APPs (Physician Assistants and Nurse Practitioners) who all work together to provide you with the care you need, when you need it.  Your next appointment:   3 month(s)  Provider:   DR Alvis Ba   We recommend signing up for the patient portal called "MyChart".  Sign up information is provided on this After Visit Summary.  MyChart is used to connect with patients for Virtual Visits (Telemedicine).  Patients are able to view lab/test results, encounter notes, upcoming appointments, etc.  Non-urgent messages can be sent to your provider as well.   To learn more about what you can do with MyChart, go to [email or web address].       Luana Rumple, MD, [facility] [phone] office [phone] pager

## 2023-09-06 ENCOUNTER — Encounter: Payer: Self-pay | Admitting: Cardiovascular Disease

## 2023-09-12 ENCOUNTER — Ambulatory Visit: Payer: Self-pay | Admitting: Cardiovascular Disease

## 2023-09-12 ENCOUNTER — Other Ambulatory Visit: Payer: Self-pay | Admitting: Family Medicine

## 2023-09-12 LAB — BASIC METABOLIC PANEL WITH GFR
BUN/Creatinine Ratio: 30 — ABNORMAL HIGH (ref 10–24)
BUN: 43 mg/dL — ABNORMAL HIGH (ref 8–27)
CO2: 20 mmol/L (ref 20–29)
Calcium: 8.8 mg/dL (ref 8.6–10.2)
Chloride: 104 mmol/L (ref 96–106)
Creatinine, Ser: 1.44 mg/dL — ABNORMAL HIGH (ref 0.76–1.27)
Glucose: 93 mg/dL (ref 70–99)
Potassium: 5.5 mmol/L — ABNORMAL HIGH (ref 3.5–5.2)
Sodium: 140 mmol/L (ref 134–144)
eGFR: 48 mL/min/{1.73_m2} — ABNORMAL LOW (ref >59)

## 2023-09-12 LAB — C-REACTIVE PROTEIN: CRP: 2 mg/L (ref 0–10)

## 2023-09-12 LAB — ANTINUCLEAR ANTIBODIES, IFA: ANA Titer 1: NEGATIVE

## 2023-09-12 LAB — RHEUMATOID FACTOR: Rheumatoid fact SerPl-aCnc: <10 [IU]/mL (ref <14.0)

## 2023-09-12 LAB — SEDIMENTATION RATE: Sed Rate: 4 mm/h (ref 0–30)

## 2023-09-28 ENCOUNTER — Other Ambulatory Visit: Payer: Self-pay | Admitting: Family Medicine

## 2023-09-28 DIAGNOSIS — N400 Enlarged prostate without lower urinary tract symptoms: Secondary | ICD-10-CM

## 2023-10-12 ENCOUNTER — Other Ambulatory Visit (HOSPITAL_COMMUNITY)

## 2023-10-15 ENCOUNTER — Ambulatory Visit: Admitting: Physician Assistant

## 2023-10-15 ENCOUNTER — Encounter: Payer: Self-pay | Admitting: Physician Assistant

## 2023-10-15 VITALS — BP 144/76 | HR 53 | Ht 75.0 in | Wt 248.2 lb

## 2023-10-15 DIAGNOSIS — Z7901 Long term (current) use of anticoagulants: Secondary | ICD-10-CM

## 2023-10-15 DIAGNOSIS — I3139 Other pericardial effusion (noninflammatory): Secondary | ICD-10-CM | POA: Diagnosis not present

## 2023-10-15 DIAGNOSIS — I4892 Unspecified atrial flutter: Secondary | ICD-10-CM

## 2023-10-15 DIAGNOSIS — Z860101 Personal history of adenomatous and serrated colon polyps: Secondary | ICD-10-CM | POA: Diagnosis not present

## 2023-10-15 DIAGNOSIS — I483 Typical atrial flutter: Secondary | ICD-10-CM

## 2023-10-15 NOTE — Progress Notes (Signed)
 Chief Complaint: Screening for colorectal cancer  HPI:    [name] is an 88 year old male with a past medical history as listed below including OSA on CPAP, atrial flutter and on Eliquis , known to Dr. Leonia Raman, who was referred to me by Marquetta Sit, MD for discussion of a colonoscopy.    01/30/2018 colonoscopy done for history of adenomatous polyps on a colonoscopy 5 years prior with six 6 to 8 mm polyps in the sigmoid colon, transverse colon, ascending colon and cecum as well as diverticulosis in the left colon and internal hemorrhoids.  Pathology showed adenomatous polyps.  No repeat recommended due to age.    08/14/23 echocardiogram with persistent large pericardial effusion.  LVEF 60-65%.  At that time started on Colchicine .  CT confirmed.    09/05/2023 patient seen by cardiology and at that time following up for a large pericardial effusion, he was diagnosed during a hospitalization for syncope in May 2024, unclear etiology.  That time noticing some dizziness if he stood up quickly.  Recommended a repeat echo in 2 months.    Today, patient presents to clinic and wants to discuss possibly another colonoscopy.  He does relay all of his heart history as above.  He is getting somewhat dizzy sometimes if he stands up.  Otherwise no GI symptoms, no abdominal pain, no weight loss no change in bowel habits no blood in his stool.    Denies fever or chills.   Past Medical History:  Diagnosis Date   Adenomatous colon polyp 06/2000   ALLERGIC RHINITIS 10/03/2007   Allergy    seasonal   Arthritis    knees   Atrial flutter (HCC) 06/26/2014   Cancer (HCC)    skin cancers removed in the past basal cell and squamous cell   CARDIAC MURMUR, AORTIC 10/03/2007   ERECTILE DYSFUNCTION 10/03/2007   High blood pressure 03/2016   HYPERLIPIDEMIA 10/03/2007   Rosacea    Sleep apnea     Past Surgical History:  Procedure Laterality Date   ADENOIDECTOMY     CARDIOVERSION N/A 07/01/2014   Procedure:  CARDIOVERSION;  Surgeon: Luana Rumple, MD;  Location: [facility] ENDOSCOPY;  Service: Cardiovascular;  Laterality: N/A;   COLONOSCOPY     PALATE SURGERY     RIGHT HEART CATH N/A 09/11/2022   Procedure: RIGHT HEART CATH;  Surgeon: Avanell Leigh, MD;  Location: [facility] INVASIVE CV LAB;  Service: Cardiovascular;  Laterality: N/A;   TEE WITHOUT CARDIOVERSION N/A 07/01/2014   Procedure: TRANSESOPHAGEAL ECHOCARDIOGRAM (TEE);  Surgeon: Luana Rumple, MD;  Location: [facility] ENDOSCOPY;  Service: Cardiovascular;  Laterality: N/A;   TONSILLECTOMY     TOTAL KNEE ARTHROPLASTY Right 08/27/2017   Procedure: RIGHT TOTAL KNEE ARTHROPLASTY;  Surgeon: Christie Cox, MD;  Location: [facility] OR;  Service: Orthopedics;  Laterality: Right;    Current Outpatient Medications  Medication Sig Dispense Refill   allopurinol  (ZYLOPRIM ) 100 MG tablet TAKE 2 TABLETS EVERY DAY 180 tablet 1   co-enzyme Q-10 30 MG capsule Take 30 mg by mouth every Monday, Wednesday, and Friday.     colchicine  0.6 MG tablet Take 1 tablet (0.6 mg total) by mouth daily. For pericardial effusion. 90 tablet 0   ELIQUIS  5 MG TABS tablet TAKE 1 TABLET TWICE DAILY 180 tablet 3   fluticasone  (FLONASE ) 50 MCG/ACT nasal spray PLACE 1 SPRAY INTO BOTH NOSTRILS EVERY OTHER DAY. 16 g 3   lisinopril  (ZESTRIL ) 10 MG tablet Take 1 tablet (10 mg total) by mouth daily. 90 tablet  3   minocycline  (MINOCIN ) 100 MG capsule Take 100 mg by mouth every other day.     Multiple Vitamin (MULTIVITAMIN) tablet Take 1 tablet by mouth daily.     simvastatin  (ZOCOR ) 20 MG tablet Take 1 tablet (20 mg total) by mouth every other day. 45 tablet 3   tamsulosin  (FLOMAX ) 0.4 MG CAPS capsule TAKE 1 CAPSULE EVERY DAY 90 capsule 1   No current facility-administered medications for this visit.    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    Comment: retired  Tobacco Use   Smoking status: Former   Smokeless tobacco: Never  Vaping Use   Vaping status: Never Used  Substance and Sexual Activity   Alcohol use: Yes    Alcohol/week: 8.0 standard drinks of alcohol    Types: 8 Glasses of wine per week    Comment: ocassionally    Drug use: No   Sexual activity: Not Currently  Other Topics Concern   Not on file  Social History Narrative   Lives alone;wife deceased in the last year;two story house, but occupies main level mostly   Has 4 children, two biological, two stepchildren, 9 grandkids   One son local, supportive    Active playing golf with friends, goes to Systems analyst 2 days/week at [employer]   Enjoys reading, soduku   Social Drivers of Health   Financial Resource Strain: Low Risk  (05/30/2023)   Overall Financial Resource Strain (CARDIA)    Difficulty of Paying Living Expenses: Not hard at all  Food Insecurity: No Food Insecurity (05/30/2023)   Hunger Vital Sign    Worried About Running Out of Food in the Last Year: Never true    Ran Out of Food in the Last Year: Never true  Transportation Needs: No Transportation Needs (05/30/2023)   PRAPARE - Administrator, Civil Service (Medical): No    Lack of Transportation (Non-Medical): No  Physical Activity: Insufficiently Active (05/30/2023)   Exercise Vital Sign    Days of Exercise per Week: 2 days    Minutes of Exercise per Session: 30 min  Stress: No Stress Concern Present (05/30/2023)   [employer] of Occupational Health - Occupational Stress Questionnaire    Feeling of Stress : Not at all  Social Connections: Socially Integrated  (05/20/2021)   Social Connection and Isolation Panel    Frequency of Communication with Friends and Family: More than three times a week    Frequency of Social Gatherings with Friends and Family: More than three times a week    Attends Religious Services: More than 4 times per year    Active Member of [employer] or Organizations: Yes    Attends Banker Meetings: More than 4 times per year    Marital Status: Married  Catering manager Violence: Not At Risk (05/30/2023)   Humiliation, Afraid,  Rape, and Kick questionnaire    Fear of Current or Ex-Partner: No    Emotionally Abused: No    Physically Abused: No    Sexually Abused: No    Review of Systems:    Constitutional: No weight loss, fever or chills Skin: No rash  Cardiovascular: No chest pain Respiratory: No SOB  Gastrointestinal: See HPI and otherwise negative Genitourinary: No dysuria  Neurological: No headache, dizziness or syncope Musculoskeletal: No new muscle or joint pain Hematologic: No bleeding  Psychiatric: No history of depression or anxiety   Physical Exam:  Vital signs: BP (!) 144/76 (BP Location: Left Arm, Patient Position: Sitting, Cuff Size: Normal)   Pulse (!) 53   Ht 6' 3 (1.905 m)   Wt 248 lb 4 oz (112.6 kg)   BMI 31.03 kg/m    Constitutional:   Pleasant elderly Caucasian male appears to be in NAD, Well developed, Well nourished, alert and cooperative Head:  Normocephalic and atraumatic. Eyes:   PEERL, EOMI. No icterus. Conjunctiva pink. Ears:  Normal auditory acuity. Neck:  Supple Throat: Oral cavity and pharynx without inflammation, swelling or lesion.  Respiratory: Respirations even and unlabored. Lungs clear to auscultation bilaterally.   No wheezes, crackles, or rhonchi.  Cardiovascular: Normal S1, S2. No MRG. Regular rate and rhythm. No peripheral edema, cyanosis or pallor.  Gastrointestinal:  Soft, nondistended, nontender. No rebound or guarding. Normal bowel sounds. No appreciable masses  or hepatomegaly. Rectal:  Not performed.  Msk:  Symmetrical without gross deformities. Without edema, no deformity or joint abnormality.  Neurologic:  Alert and  oriented x4;  grossly normal neurologically.  Skin:   Dry and intact without significant lesions or rashes. Psychiatric: Demonstrates good judgement and reason without abnormal affect or behaviors.  RELEVANT LABS AND IMAGING: CBC    Component Value Date/Time   WBC 5.2 02/19/2023 0909   RBC 4.27 02/19/2023 0909   HGB 13.9 02/19/2023 0909   HCT 43.0 02/19/2023 0909   PLT 150.0 02/19/2023 0909   MCV 100.6 (H) 02/19/2023 0909   MCH 32.8 09/10/2022 0548   MCHC 32.4 02/19/2023 0909   RDW 14.2 02/19/2023 0909   LYMPHSABS 0.9 02/19/2023 0909   MONOABS 0.6 02/19/2023 0909   EOSABS 0.1 02/19/2023 0909   BASOSABS 0.0 02/19/2023 0909    CMP     Component Value Date/Time   NA 140 09/05/2023 1254   K 5.5 (H) 09/05/2023 1254   CL 104 09/05/2023 1254   CO2 20 09/05/2023 1254   GLUCOSE 93 09/05/2023 1254   GLUCOSE 97 02/19/2023 0909   BUN 43 (H) 09/05/2023 1254   CREATININE 1.44 (H) 09/05/2023 1254   CREATININE 1.14 (H) 03/08/2020 0957   CALCIUM 8.8 09/05/2023 1254   PROT 6.2 02/19/2023 0909   ALBUMIN 3.8 02/19/2023 0909   AST 16 02/19/2023 0909   ALT 11 02/19/2023 0909   ALKPHOS 73 02/19/2023 0909   BILITOT 0.9 02/19/2023 0909   GFRNONAA 53 (L) 09/11/2022 0246   GFRAA >60 08/16/2017 0936    Assessment: 1.  History of adenomatous polyps: Last colonoscopy in 2019 with 6 polyps, no repeat recommended due to age 89.  Pericardial effusion: Currently being worked up by cardiology and recently started on Colchicine  with repeat echo planned in a couple months, the patient does report some dizziness, uncertain etiology 3.  A-flutter on Eliquis   Plan: 1.  At this point discussed with patient that he would be very high risk for repeat surveillance colonoscopy given his pericardial effusion and  cardiac issues.  I do not recommend  that he proceed for surveillance colonoscopy.  If he were to start seeing a change in bowel habits, blood in his stool, weight loss or other GI issues then we could discuss further.  He verbalized understanding. 2.  Patient to follow in clinic with us  as needed.  He was assigned to Dr. Dominic Friendly today.  Reginal Capra, PA-C [facility] 10/15/2023, 9:35 AM  Cc: Marquetta Sit, MD

## 2023-10-15 NOTE — Patient Instructions (Signed)
 Thank you for trusting me with your gastrointestinal care!  Reginal Capra, PA-C   _______________________________________________________  If your blood pressure at your visit was 140/90 or greater, please contact your primary care physician to follow up on this.  _______________________________________________________  If you are age 86 or older, your body mass index should be between 23-30. Your Body mass index is 31.03 kg/m. If this is out of the aforementioned range listed, please consider follow up with your Primary Care Provider.  If you are age 7 or younger, your body mass index should be between 19-25. Your Body mass index is 31.03 kg/m. If this is out of the aformentioned range listed, please consider follow up with your Primary Care Provider.   ________________________________________________________  The [facility] providers would like to encourage you to use MYCHART to communicate with providers for non-urgent requests or questions.  Due to long hold times on the telephone, sending your provider a message by [facility] may be a faster and more efficient way to get a response.  Please allow 48 business hours for a response.  Please remember that this is for non-urgent requests.  _______________________________________________________

## 2023-10-16 ENCOUNTER — Ambulatory Visit: Attending: Cardiovascular Disease | Admitting: Cardiovascular Disease

## 2023-10-16 ENCOUNTER — Encounter: Payer: Self-pay | Admitting: Cardiovascular Disease

## 2023-10-16 DIAGNOSIS — I10 Essential (primary) hypertension: Secondary | ICD-10-CM

## 2023-10-16 DIAGNOSIS — I350 Nonrheumatic aortic (valve) stenosis: Secondary | ICD-10-CM | POA: Diagnosis not present

## 2023-10-16 DIAGNOSIS — I499 Cardiac arrhythmia, unspecified: Secondary | ICD-10-CM

## 2023-10-16 DIAGNOSIS — I3139 Other pericardial effusion (noninflammatory): Secondary | ICD-10-CM

## 2023-10-16 DIAGNOSIS — I483 Typical atrial flutter: Secondary | ICD-10-CM

## 2023-10-16 DIAGNOSIS — G4733 Obstructive sleep apnea (adult) (pediatric): Secondary | ICD-10-CM

## 2023-10-16 DIAGNOSIS — Z7901 Long term (current) use of anticoagulants: Secondary | ICD-10-CM

## 2023-10-16 NOTE — Patient Instructions (Signed)
 Medication Instructions:  Your physician recommends that you continue on your current medications as directed. Please refer to the Current Medication list given to you today.  *If you need a refill on your cardiac medications before your next appointment, please call your pharmacy*  Follow-Up: At [facility], you and your health needs are our priority.  As part of our continuing mission to provide you with exceptional heart care, our providers are all part of one team.  This team includes your primary Cardiologist (physician) and Advanced Practice Providers or APPs (Physician Assistants and Nurse Practitioners) who all work together to provide you with the care you need, when you need it.  Your next appointment:   1 year(s)  Provider:   Dr. Micael Adas (sleep)  We recommend signing up for the patient portal called MyChart.  Sign up information is provided on this After Visit Summary.  MyChart is used to connect with patients for Virtual Visits (Telemedicine).  Patients are able to view lab/test results, encounter notes, upcoming appointments, etc.  Non-urgent messages can be sent to your provider as well.   To learn more about what you can do with MyChart, go to [email or web address].   Other Instructions Keep upcoming appointments

## 2023-10-16 NOTE — Progress Notes (Signed)
 I am going to send this to Dr. Nandigam for supervising since she took the DOD call about this patient on 07/27/23 and is also in our pod.  - H. Danis

## 2023-10-16 NOTE — Progress Notes (Unsigned)
 Patient ID: Gary [name], male   DOB: January 28, 1936, 88 y.o.   MRN: [id number]       HPI: KAMAURI DENARDO, is a 88 y.o. male who presents for a 5 month F/U sleep evaluation   Mr. Ernesto Heady  is a patient of Dr. Alvis Ba.  He is status post cardioversion for atrial flutter, and has a history of hyperlipidemia and aortic valve sclerosis.  He was referred for a sleep study which was done in May 2016 which revealed moderate obstructive sleep apnea with an AHI of 15.9 overall and 20.5 per hour during REM sleep.  He had reduced sleep efficiency at 54.8%.  Latency to sleep onset was prolonged at 36 minutes.  There was mild snoring.  Oxygen desaturated to 89%.  He underwent a CPAP titration trial and adnexal response to CPAP therapy with the initial recommended CPAP pressure 9 cm water pressure.  Due to oral venting, a nasal pillow mask with chinstrap was recommended.  Since initiating CPAP therapy, he has felt well.  He typically goes to bed at 10:30 and wakes up the proximal he 7:30 AM.  A download was obtained from 11/22/2014 through 12/21/2014.  Compliance was excellent with 100% days used and 100% of days used greater than 4 hours.  He is averaging 7 hours and 30 minutes of sleep per night.  On his 77 m water pressure, AHI is excellent at 0.8.  There is no significant mask leak and he has a ResMed AirFit P10 medium size mask.  A download was obtained from 11/28/2015 through 12/27/2015.  This confirms 100% compliance both with usage stays and greater than 4 hours of use.  He is averaging 7 hours and 22 minutes of use per night.  He is set at a 9 cm water pressure.  AHI remains excellent at 1.2 per hour.  He does have increased leak with 95th percent average at 30.0.  Despite this mild leak newborn nasal pillows.  His AHI remains excellent and undoubtedly is probably slightly less than its current reading.  He denied residual snoring.  Sleep is restorative.  He denied daytime sleepiness or bruxism.Aaron Aas   He was  found to have increased periodic limb movements on his initial sleep study and had increased PLMS index of 19.7, but denies painful restless legs or the urge to move.  Epworth Sleepiness Scale: Situation   Chance of Dozing/Sleeping (0 = never , 1 = slight chance , 2 = moderate chance , 3 = high chance )   sitting and reading 1   watching TV 1   sitting inactive in a public place 0   being a passenger in a motor vehicle for an hour or more 1   lying down in the afternoon 2   sitting and talking to someone 0   sitting quietly after lunch (no alcohol) 0   while stopped for a few minutes in traffic as the driver 0   Total Score  5   I saw him in October 2018 at which time he continued to have 100% compliance was CPAP therapy.  He was averaging 6 hours and 26 minutes per night of use.  At a set pressure of 9 cm, AHI was excellent at 0.7.  He has a Lawyer P85medium-size with chinstrap mask.  There was minimal leak intermittently.  An Epworth Sleepiness Scale score was recalculated and this endorsed at 4.   He was levaluated by me in November 2019 at which time he continued  to do well with reference to his sleep apnea. A new download was obtained in the office today from October 11 through March 09, 2018.  He continued to be compliant and is averaging 7 hours and 36 minutes of CPAP use per night.  AHI remains excellent at 0.7.  At times he has had some irritation from his mask.  His sleep is restorative.  He is unaware of breakthrough snoring.  He continues to have atrial flutter which is chronic and is followed by Dr. Alvis Ba.    He developed COVID-19 infection in December, 2020 and was last seen in our office in January 2021 by Ervin Heath, PA.  He was having some palpitations.  I saw him in November 2019.  At that time he continued to be on CPAP therapy with excellent compliane. A download was obtained from August 18, 2019 through Sep 16, 2019 which confirms 100% compliance with average usage  7 hours and 14 minutes.  At 9 cm set pressure, AHI is excellent at 0.5.  He has a DreamWear nasal mask.  He is sleeping well.  He wasunaware of breakthrough snoring.  He typically goes to bed between 11 and 1130 and wakes up at 7:30 AM.  Blood pressure is recently been slightly increased.    He saw Dr. Alvis Ba November 2021 and remained stable with asymptomatic and spontaneously rate controlled atrial flutter.  He was not having symptoms of bradycardia and it was not felt he needed a pacemaker.  He had continued to be on Eliquis .  I last saw him on Sep 16, 2020.  He was continuing to use CPAP.  His CPAP unit is in ResMed air sense 10 auto unit at 9 cm set pressure.  With his older unit having only 3G capability, effective mid April, we are not able to access his data wirelessly since only 5G units are now able to be accessed.  However, download was obtained from March 1 through August 08, 2020 which shows excellent compliance with average sleep at 7 hours and 31 minutes with CPAP and 98% of usage days.  At 9 cm set pressure, AHI is 0.5.  He has a DreamWear mask.  He is exercising 2 days/week with a trainer.  He has noticed his blood pressure being elevated at home.    I last saw him on May 08, 2022., he has continued to be followed by Dr. Alvis Ba and last saw him in February 2023.  From a sleep perspective, he has continued to use CPAP.  His machine is no longer wireless since his set up date was in June 2016 and technology is now 5G.  He is in need for a new machine and also will need to change his DME company from [facility] to another company based on his insurance.  He brought his machine to the office today.  A download was obtained from December 7 through May 05, 2022.  Usage is 100%.  At 9 cm water pressure AHI is excellent at 0.6 events per hour.  He typically goes to bed between 1130 and midnight and wakes up between 8 and 8:30.  Average use was 7 hours and 3 minutes.  During that  evaluation, he qualified for a new machine and scheduled him to have a new ResMed AirSense 11 CPAP auto unit.  Mr. Francisco received his new ResMed AirSense 11 AutoSet unit on June 06, 2022.  He has a new DME.  A download was obtained from May 28  through October 25, 2022 which shows excellent compliance.  Average use is 6 hours and 22 minutes.  At 9 cm set pressure, AHI 0.5.  He does have significant mask leak.  Typically he goes to bed around 1130 to midnight and wakes up at 8 AM.  He has had mask issues.  He presents for evaluation.   Past Medical History:  Diagnosis Date   Adenomatous colon polyp 06/2000   ALLERGIC RHINITIS 10/03/2007   Allergy    seasonal   Arthritis    knees   Atrial flutter (HCC) 06/26/2014   Cancer (HCC)    skin cancers removed in the past basal cell and squamous cell   CARDIAC MURMUR, AORTIC 10/03/2007   Colon polyps    ERECTILE DYSFUNCTION 10/03/2007   High blood pressure 03/2016   HYPERLIPIDEMIA 10/03/2007   Rosacea    Sleep apnea     Past Surgical History:  Procedure Laterality Date   ADENOIDECTOMY     CARDIOVERSION N/A 07/01/2014   Procedure: CARDIOVERSION;  Surgeon: Luana Rumple, MD;  Location: [facility] ENDOSCOPY;  Service: Cardiovascular;  Laterality: N/A;   COLONOSCOPY     PALATE SURGERY     RIGHT HEART CATH N/A 09/11/2022   Procedure: RIGHT HEART CATH;  Surgeon: Avanell Leigh, MD;  Location: [facility] INVASIVE CV LAB;  Service: Cardiovascular;  Laterality: N/A;   TEE WITHOUT CARDIOVERSION N/A 07/01/2014   Procedure: TRANSESOPHAGEAL ECHOCARDIOGRAM (TEE);  Surgeon: Luana Rumple, MD;  Location: [facility] ENDOSCOPY;  Service: Cardiovascular;  Laterality: N/A;   TONSILLECTOMY     TOTAL KNEE ARTHROPLASTY Right 08/27/2017   Procedure: RIGHT TOTAL KNEE ARTHROPLASTY;  Surgeon: Christie Cox, MD;  Location: [facility] OR;  Service: Orthopedics;  Laterality: Right;    No Known Allergies  Current Outpatient Medications  Medication Sig Dispense Refill   allopurinol  (ZYLOPRIM ) 100  MG tablet TAKE 2 TABLETS EVERY DAY 180 tablet 1   co-enzyme Q-10 30 MG capsule Take 30 mg by mouth every Monday, Wednesday, and Friday.     colchicine  0.6 MG tablet Take 1 tablet (0.6 mg total) by mouth daily. For pericardial effusion. 90 tablet 0   ELIQUIS  5 MG TABS tablet TAKE 1 TABLET TWICE DAILY 180 tablet 3   fluticasone  (FLONASE ) 50 MCG/ACT nasal spray PLACE 1 SPRAY INTO BOTH NOSTRILS EVERY OTHER DAY. 16 g 3   lisinopril  (ZESTRIL ) 10 MG tablet Take 1 tablet (10 mg total) by mouth daily. 90 tablet 3   minocycline  (MINOCIN ) 100 MG capsule Take 100 mg by mouth every other day.     Multiple Vitamin (MULTIVITAMIN) tablet Take 1 tablet by mouth daily.     simvastatin  (ZOCOR ) 20 MG tablet Take 1 tablet (20 mg total) by mouth every other day. 45 tablet 3   tamsulosin  (FLOMAX ) 0.4 MG CAPS capsule TAKE 1 CAPSULE EVERY DAY 90 capsule 1   No current facility-administered medications for this visit.    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    Comment: retired  Tobacco Use   Smoking status: Former   Smokeless tobacco: Never  Vaping Use   Vaping status: Never Used  Substance and Sexual Activity   Alcohol use: Yes    Alcohol/week: 8.0 standard drinks of alcohol    Types:  8 Glasses of wine per week    Comment: ocassionally    Drug use: No   Sexual activity: Not Currently  Other Topics Concern   Not on file  Social History Narrative   Lives alone;wife deceased in the last year;two story house, but occupies main level mostly   Has 4 children, two biological, two stepchildren, 9 grandkids   One son local, supportive    Active playing golf with friends, goes to Systems analyst 2 days/week at [employer]   Enjoys reading, soduku   Social Drivers of Health    Financial Resource Strain: Low Risk  (05/30/2023)   Overall Financial Resource Strain (CARDIA)    Difficulty of Paying Living Expenses: Not hard at all  Food Insecurity: No Food Insecurity (05/30/2023)   Hunger Vital Sign    Worried About Running Out of Food in the Last Year: Never true    Ran Out of Food in the Last Year: Never true  Transportation Needs: No Transportation Needs (05/30/2023)   PRAPARE - Administrator, Civil Service (Medical): No    Lack of Transportation (Non-Medical): No  Physical Activity: Insufficiently Active (05/30/2023)   Exercise Vital Sign    Days of Exercise per Week: 2 days    Minutes of Exercise per Session: 30 min  Stress: No Stress Concern Present (05/30/2023)   [employer] of Occupational Health - Occupational Stress Questionnaire    Feeling of Stress : Not at all  Social Connections: Socially Integrated (05/20/2021)   Social Connection and Isolation Panel    Frequency of Communication with Friends and Family: More than three times a week    Frequency of Social Gatherings with Friends and Family: More than three times a week    Attends Religious Services: More than 4 times per year    Active Member of [employer] or Organizations: Yes    Attends Engineer, structural: More than 4 times per year    Marital Status: Married  Catering manager Violence: Not At Risk (05/30/2023)   Humiliation, Afraid, Rape, and Kick questionnaire    Fear of Current or Ex-Partner: No    Emotionally Abused: No    Physically Abused: No    Sexually Abused: No    Family History  Problem Relation Age of Onset   Diabetes Mother    Cancer Father    Liver cancer Father    Hypotension Sister    CAD Brother    Stomach cancer Paternal Aunt      ROS General: Negative; No fevers, chills, or night sweats HEENT: Negative; No changes in vision or hearing, sinus congestion, difficulty swallowing Pulmonary: Negative; No cough, wheezing, shortness of breath,  hemoptysis Cardiovascular: History of atrial flutter GI: Negative; No nausea, vomiting, diarrhea, or abdominal pain GU: Negative; No dysuria, hematuria, or difficulty voiding Musculoskeletal: Negative; no myalgias, joint pain, or weakness Hematologic: Negative; no easy bruising, bleeding Endocrine: Negative; no heat/cold intolerance Neuro: Negative; no changes in balance, headaches Skin: Negative; No rashes or skin lesions Psychiatric: Negative; No behavioral problems, depression Sleep: See history of present illness   Physical Exam BP 122/72   Pulse (!) 52   Ht 6' 3 (1.905 m)   Wt 248 lb (112.5 kg)   SpO2 97%   BMI 31.00 kg/m    Repeat blood pressure by me was 124/78  Wt Readings from Last 3 Encounters:  10/16/23 248 lb (112.5 kg)  10/15/23 248 lb 4 oz (112.6 kg)  09/05/23 240 lb 9.6  oz (109.1 kg)    General: Alert, oriented, no distress.  Skin: normal turgor, no rashes, warm and dry HEENT: Normocephalic, atraumatic. Pupils equal round and reactive to light; sclera anicteric; extraocular muscles intact;  Nose without nasal septal hypertrophy Mouth/Parynx benign; Mallinpatti scale; s/p UPPPBiPAPLungs: clear to ausculatation and percussion; no wheezing or rales Chest wall: without tenderness to palpitation Heart: PMI not displaced, RRR, s1 s2 normal, 1/6 systolic murmur, no diastolic murmur, no rubs, gallops, thrills, or heaves Abdomen: Diastases recti; soft, nontender; no hepatosplenomehaly, BS+; abdominal aorta nontender and not dilated by palpation. Back: no CVA tenderness Pulses 2+ Musculoskeletal: full range of motion, normal strength, no joint deformities Extremities: Trace ankle edema no clubbing cyanosis or edema, Homan's sign negative  Neurologic: grossly nonfocal; Cranial nerves grossly wnl Psychologic: Normal mood and affect          May 08, 2022 ECG (independently read by me): Atrial flutter at 54 with variable block  Sep 16, 2020 ECG (independently  read by me): Atrial flutter at 59, variable block, predominantly 4:1  May 13, 2019 ECG (independently read by me): Atrial flutter with variable block at 56  November 2019 ECG (independently read by me): Atrial flutter at 56 bpm with variable block.  October 2018 ECG (independently read by me): Atrial flutter with a ventricular rate at 70 bpm.  Nonspecific ST changes.  QTc interval 4o6 ms.  LABS:     Latest Ref Rng & Units 09/05/2023   12:54 PM 02/19/2023    9:09 AM 09/11/2022   12:49 PM  BMP  Glucose 70 - 99 mg/dL 93  97    BUN 8 - 27 mg/dL 43  35    Creatinine 1.61 - 1.27 mg/dL 0.96  0.45    BUN/Creat Ratio 10 - 24 30     Sodium 134 - 144 mmol/L 140  141  140    140   Potassium 3.5 - 5.2 mmol/L 5.5  4.5  4.8    4.8   Chloride 96 - 106 mmol/L 104  106    CO2 20 - 29 mmol/L 20  26    Calcium 8.6 - 10.2 mg/dL 8.8  8.8          Latest Ref Rng & Units 02/19/2023    9:09 AM 09/10/2022    5:48 AM 02/13/2022    9:16 AM  Hepatic Function  Total Protein 6.0 - 8.3 g/dL 6.2  5.6  6.7   Albumin 3.5 - 5.2 g/dL 3.8  3.0  4.0   AST 0 - 37 U/L 16  18  18    ALT 0 - 53 U/L 11  20  19    Alk Phosphatase 39 - 117 U/L 73  56  61   Total Bilirubin 0.2 - 1.2 mg/dL 0.9  0.8  1.0   Bilirubin, Direct 0.0 - 0.3 mg/dL   0.2         Latest Ref Rng & Units 02/19/2023    9:09 AM 09/11/2022   12:49 PM 09/10/2022    5:48 AM  CBC  WBC 4.0 - 10.5 K/uL 5.2   6.1   Hemoglobin 13.0 - 17.0 g/dL 40.9  81.1    91.4  78.2   Hematocrit 39.0 - 52.0 % 43.0  42.0    42.0  39.9   Platelets 150.0 - 400.0 K/uL 150.0   128      Lipid Panel     Component Value Date/Time   CHOL 132 02/19/2023 0909  TRIG 75.0 02/19/2023 0909   HDL 36.90 (L) 02/19/2023 0909   CHOLHDL 4 02/19/2023 0909   VLDL 15.0 02/19/2023 0909   LDLCALC 80 02/19/2023 0909   LDLCALC 83 01/26/2020 0913   LDLDIRECT 143.5 01/22/2012 0916     RADIOLOGY: No results found.   IMPRESSION:  1. Pericardial effusion   2. Typical  atrial flutter (HCC)   3. Essential hypertension   4. Cardiac arrhythmia, unspecified cardiac arrhythmia type   5. Nonrheumatic aortic valve stenosis     ASSESSMENT AND PLAN: Mr. Koller is a 88 year-old gentleman who is a history of atrial flutter and is status post cardioversion.  He developed recurrent flutter with spontaneous rate control and has remained asymptomatic followed by Dr. Alvis Ba.   He continues to be on anticoagulation with apixaban .  He was diagnosed with sleep apnea in 2016 and received a ResMed air sense 10 auto CPAP unit, currently set at 9 cm water pressure.  Over the last several years he is consistently been compliant with CPAP use.  Since April 2022, his CPAP unit is no longer wireless due to upgrade to [employer].  His machine is now 86 years old.  He continues to be compliant with CPAP use and at his current 9 cm set pressure AHI is 0.6 from a download of November 7 through May 05, 2022.  At my evaluation in January 2024 he was doing well on therapy and he qualified for new machine.  He received a new ResMed AirSense 11 AutoSet unit on June 06, 2022.  His current machine is still set at a set pressure of 9 cm.  Compliance is excellent with 100% use and average use is 6 hours and 22 minutes.  His most recent download shows an AHI of 0.5.  However he does have mask leak.  He had been using nasal pillows.  In the office today I provided him with a new sample mask ResMed AirFit N30i which I believe he will tolerate much better.  I reiterated the importance of using CPAP through the nights entirety until he wakes up for good in the morning.  Blood pressure today is stable and on repeat me 124/74.  He continues to be on lisinopril  10 mg daily.  Atrial flutter rate is well-controlled with average rate in the mid 50s with variable block.  He is on Eliquis  for anticoagulation.  He is on simvastatin  for hyperlipidemia.  I will see him in May 2025 for follow-up evaluation.   Millicent Ally, MD, [facility], ABSM Diplomate, American Board of Sleep Medicine  10/16/2023 9:31 AM

## 2023-10-18 ENCOUNTER — Encounter: Payer: Self-pay | Admitting: Cardiovascular Disease

## 2023-10-22 DIAGNOSIS — C44722 Squamous cell carcinoma of skin of right lower limb, including hip: Secondary | ICD-10-CM | POA: Diagnosis not present

## 2023-10-22 DIAGNOSIS — C44629 Squamous cell carcinoma of skin of left upper limb, including shoulder: Secondary | ICD-10-CM | POA: Diagnosis not present

## 2023-10-22 DIAGNOSIS — L570 Actinic keratosis: Secondary | ICD-10-CM | POA: Diagnosis not present

## 2023-10-22 DIAGNOSIS — Z85828 Personal history of other malignant neoplasm of skin: Secondary | ICD-10-CM | POA: Diagnosis not present

## 2023-10-22 DIAGNOSIS — C44729 Squamous cell carcinoma of skin of left lower limb, including hip: Secondary | ICD-10-CM | POA: Diagnosis not present

## 2023-10-22 DIAGNOSIS — D485 Neoplasm of uncertain behavior of skin: Secondary | ICD-10-CM | POA: Diagnosis not present

## 2023-11-16 ENCOUNTER — Telehealth: Payer: Self-pay | Admitting: Family Medicine

## 2023-11-16 NOTE — Telephone Encounter (Signed)
 Copied from CRM [id number]. Topic: Clinical - Medication Refill >> Nov 16, 2023 12:58 PM [place] J wrote: Medication: simvastatin  (ZOCOR ) 20 MG tablet  Has the patient contacted their pharmacy? Yes (Agent: If no, request that the patient contact the pharmacy for the refill. If patient does not wish to contact the pharmacy document the reason why and proceed with request.) (Agent: If yes, when and what did the pharmacy advise?) Pharmacy did not have refills available.  This is the patient's preferred pharmacy:  [facility] - [place], [place] - [address] [place] [address] Phone: [phone] Fax: [phone]  Is this the correct pharmacy for this prescription? Yes If no, delete pharmacy and type the correct one.   Has the prescription been filled recently? No  Is the patient out of the medication? No  Has the patient been seen for an appointment in the last year OR does the patient have an upcoming appointment? Yes  Can we respond through MyChart? Yes  Agent: Please be advised that Rx refills may take up to 3 business days. We ask that you follow-up with your pharmacy.

## 2023-11-19 ENCOUNTER — Ambulatory Visit (HOSPITAL_COMMUNITY)
Admission: RE | Admit: 2023-11-19 | Discharge: 2023-11-19 | Disposition: A | Discharge status: Home / Self Care | Source: Ambulatory Visit | Attending: Cardiology | Admitting: Cardiology

## 2023-11-19 DIAGNOSIS — I3139 Other pericardial effusion (noninflammatory): Secondary | ICD-10-CM | POA: Insufficient documentation

## 2023-11-19 LAB — ECHOCARDIOGRAM LIMITED
Area-P 1/2: 3.42 cm2
MV M vel: 5.63 m/s
MV Peak grad: 126.6 mmHg
P 1/2 time: 600 ms
Radius: 0.7 cm
S' Lateral: 3.4 cm

## 2023-11-19 MED ORDER — SIMVASTATIN 20 MG PO TABS: 20.0000 mg | ORAL_TABLET | ORAL | 1 refills | Status: DC

## 2023-11-20 ENCOUNTER — Ambulatory Visit: Payer: Self-pay | Admitting: Cardiovascular Disease

## 2023-12-20 DIAGNOSIS — H43813 Vitreous degeneration, bilateral: Secondary | ICD-10-CM | POA: Diagnosis not present

## 2023-12-20 DIAGNOSIS — H34832 Tributary (branch) retinal vein occlusion, left eye, with macular edema: Secondary | ICD-10-CM | POA: Diagnosis not present

## 2023-12-20 DIAGNOSIS — H35361 Drusen (degenerative) of macula, right eye: Secondary | ICD-10-CM | POA: Diagnosis not present

## 2023-12-20 DIAGNOSIS — H3582 Retinal ischemia: Secondary | ICD-10-CM | POA: Diagnosis not present

## 2023-12-20 DIAGNOSIS — H35033 Hypertensive retinopathy, bilateral: Secondary | ICD-10-CM | POA: Diagnosis not present

## 2024-01-04 ENCOUNTER — Ambulatory Visit: Attending: Cardiovascular Disease | Admitting: Cardiovascular Disease

## 2024-01-04 VITALS — BP 158/77 | HR 57 | Ht 75.0 in | Wt 257.0 lb

## 2024-01-04 DIAGNOSIS — G4733 Obstructive sleep apnea (adult) (pediatric): Secondary | ICD-10-CM | POA: Diagnosis not present

## 2024-01-04 DIAGNOSIS — D6869 Other thrombophilia: Secondary | ICD-10-CM | POA: Diagnosis not present

## 2024-01-04 DIAGNOSIS — I10 Essential (primary) hypertension: Secondary | ICD-10-CM | POA: Diagnosis not present

## 2024-01-04 DIAGNOSIS — I483 Typical atrial flutter: Secondary | ICD-10-CM | POA: Diagnosis not present

## 2024-01-04 DIAGNOSIS — E041 Nontoxic single thyroid nodule: Secondary | ICD-10-CM

## 2024-01-04 DIAGNOSIS — E7800 Pure hypercholesterolemia, unspecified: Secondary | ICD-10-CM | POA: Diagnosis not present

## 2024-01-04 DIAGNOSIS — I3139 Other pericardial effusion (noninflammatory): Secondary | ICD-10-CM

## 2024-01-04 NOTE — Progress Notes (Signed)
 Cardiology Clinic Note   Patient Name: Gary [name] Date of Encounter: 01/04/2024  Primary Care Provider:  Micheal Wolm ORN, MD Primary Cardiologist:  Jerel Balding, MD  Chief Complaint  Patient presents with   Irregular Heart Beat   Pericardial Effusion      Gary [name] 88 year old male presents to the clinic today for follow-up evaluation of large pericardial effusion, permanent atrial flutter with slow ventricular response, HTN, OSA on CPAP .    He has had a large pericardial effusion diagnosed during a hospitalization for syncope in May 2024, unclear etiology, no evidence of tamponade by right heart catheterization, negative markers for autoimmune disease.  Chest CT did not show any evidence suggesting malignancy or lymphadenopathy but did show an incidental thyroid  nodule.  The effusion has remained largely unchanged during the year of follow-up and did not decrease in size with colchicine  therapy, which we have subsequently stopped.  He is feeling well.  He just returned from a river cruise trip in [place]  and thinks that he put on a little too much weight and ate a little too much salty food.  He has mild ankle swelling but denies shortness of breath except when he has to climb an incline, not much changed over the last year.  Denies either pleuritic or anginal chest pain, orthopnea, PND, palpitations, syncope or near syncope.  He remains compliant with CPAP and denies hypersomnolence.    After his syncopal event and hospitalization he wore a 14-day monitor.  This showed persistent atrial flutter with an average ventricular rate of 55 bpm.  He had a handful of nocturnal pauses lasting up to 3.3 seconds, nonsymptomatic, none in excess of 5 seconds.  He has not had any syncope in over a year of subsequent follow-up.  His metabolic profile remains quite good with a total cholesterol 132, HDL 37, LDL 80 and hemoglobin A1c 3.7%.  He has mildly abnormal kidney function with a  creatinine most recently 1.42 , appears to be at baseline (GFR 45-50).  Past Medical History    Past Medical History:  Diagnosis Date   Adenomatous colon polyp 06/2000   ALLERGIC RHINITIS 10/03/2007   Allergy    seasonal   Arthritis    knees   Atrial flutter (HCC) 06/26/2014   Cancer (HCC)    skin cancers removed in the past basal cell and squamous cell   CARDIAC MURMUR, AORTIC 10/03/2007   Colon polyps    ERECTILE DYSFUNCTION 10/03/2007   High blood pressure 03/2016   HYPERLIPIDEMIA 10/03/2007   Rosacea    Sleep apnea    Past Surgical History:  Procedure Laterality Date   ADENOIDECTOMY     CARDIOVERSION N/A 07/01/2014   Procedure: CARDIOVERSION;  Surgeon: Jerel Balding, MD;  [facility]COPY;  Service: Cardiovascular;  Laterality: N/A;   COLONOSCOPY     PALATE SURGERY     RIGHT HEART CATH N/A 09/11/2022   Procedure: RIGHT HEART CATH;  Surgeon: Court Dorn PARAS, MD;  Location: [facility] INVASIVE CV LAB;  Service: Cardiovascular;  Laterality: N/A;   TEE WITHOUT CARDIOVERSION N/A 07/01/2014   Procedure: TRANSESOPHAGEAL ECHOCARDIOGRAM (TEE);  Surgeon: Jerel Balding, MD;  Location: [facility] ENDOSCOPY;  Service: Cardiovascular;  Laterality: N/A;   TONSILLECTOMY     TOTAL KNEE ARTHROPLASTY Right 08/27/2017   Procedure: RIGHT TOTAL KNEE ARTHROPLASTY;  Surgeon: Rubie Kemps, MD;  Location: [facility] OR;  Service: Orthopedics;  Laterality: Right;    Allergies  No known drug allergies  Home Medications  Prior to Admission medications   Medication Sig Start Date End Date Taking? Authorizing Provider  allopurinol  (ZYLOPRIM ) 100 MG tablet Take 2 tablets (200 mg total) by mouth daily. 02/07/21   Koberlein, Junell C, MD  co-enzyme Q-10 30 MG capsule Take 30 mg by mouth every other day.     [provider]  ELIQUIS  5 MG TABS tablet TAKE 1 TABLET TWICE DAILY 01/17/21   Rendy Lazard, MD  fluticasone  (FLONASE ) 50 MCG/ACT nasal spray Place 1 spray into both nostrils every other day.  02/16/20   Koberlein, Junell C, MD  lisinopril  (ZESTRIL ) 5 MG tablet Take 2 tablets (10 mg total) by mouth daily. 09/16/20   Burnard Debby LABOR, MD  minocycline  (MINOCIN ,DYNACIN ) 100 MG capsule Take 100 mg by mouth every other day.     [provider]  Multiple Vitamin (MULTIVITAMIN) tablet Take 1 tablet by mouth daily.    [provider]  simvastatin  (ZOCOR ) 20 MG tablet Take 1 tablet (20 mg total) by mouth every other day. 09/16/20   Koberlein, Junell C, MD  tamsulosin  (FLOMAX ) 0.4 MG CAPS capsule TAKE 1 CAPSULE EVERY DAY 03/23/20   Koberlein, Junell C, MD    Family History    Family History  Problem Relation Age of Onset   Diabetes Mother    Cancer Father    Liver cancer Father    Hypotension Sister    CAD Brother    Stomach cancer Paternal Aunt    He indicated that his mother is deceased. He indicated that his father is deceased. He indicated that his sister is alive. He indicated that his brother is alive. He indicated that his maternal grandmother is deceased. He indicated that his maternal grandfather is deceased. He indicated that his paternal grandmother is deceased. He indicated that his paternal grandfather is deceased. He indicated that the status of his paternal aunt is unknown.   Physical Exam    VS:  BP (!) 158/77   Pulse (!) 57   Ht 6' 3 (1.905 m)   Wt 257 lb (116.6 kg)   SpO2 97%   BMI 32.12 kg/m  , BMI Body mass index is 32.12 kg/m.  General: Alert, oriented x3, no distress, appears well Head: no evidence of trauma, PERRL, EOMI, no exophtalmos or lid lag, no myxedema, no xanthelasma; normal ears, nose and oropharynx Neck: normal jugular venous pulsations and no hepatojugular reflux; brisk carotid pulses without delay and no carotid bruits Chest: clear to auscultation, no signs of consolidation by percussion or palpation, normal fremitus, symmetrical and full respiratory excursions Cardiovascular: normal position and quality of the apical impulse,  irregular rhythm, normal nonmuffled first and second heart sounds, no murmurs, rubs or gallops Abdomen: no tenderness or distention, no masses by palpation, no abnormal pulsatility or arterial bruits, normal bowel sounds, no hepatosplenomegaly Extremities: no clubbing, cyanosis or edema; 2+ radial, ulnar and brachial pulses bilaterally; 2+ right femoral, posterior tibial and dorsalis pedis pulses; 2+ left femoral, posterior tibial and dorsalis pedis pulses; no subclavian or femoral bruits Neurological: grossly nonfocal Psych: Normal mood and affect   HYPERTENSION CONTROL Vitals:   01/04/24 1014 01/04/24 1047  BP: (!) 182/82 (!) 158/77    The patient's blood pressure is elevated above target today.  In order to address the patient's elevated BP: Blood pressure will be monitored at home to determine if medication changes need to be made.       Accessory Clinical Findings    Recent Labs: 02/19/2023: ALT 11; Hemoglobin  13.9; Platelets 150.0 09/05/2023: BUN 43; Creatinine, Ser 1.44; Potassium 5.5; Sodium 140   Recent Lipid Panel    Component Value Date/Time   CHOL 132 02/19/2023 0909   TRIG 75.0 02/19/2023 0909   HDL 36.90 (L) 02/19/2023 0909   CHOLHDL 4 02/19/2023 0909   VLDL 15.0 02/19/2023 0909   LDLCALC 80 02/19/2023 0909   LDLCALC 83 01/26/2020 0913   LDLDIRECT 143.5 01/22/2012 0916  02/13/2022: Cholesterol 146; HDL 38.70; LDL Cholesterol 92; Triglycerides 77.0; VLDL 15.4  ECG    EKG Interpretation Date/Time:    Ventricular Rate:    PR Interval:    QRS Duration:    QT Interval:    QTC Calculation:   R Axis:      Text Interpretation:           Echocardiogram 11/19/2023    1. Left ventricular ejection fraction, by estimation, is 60 to 65%. The  left ventricle has normal function. The left ventricle has no regional  wall motion abnormalities. Left ventricular diastolic parameters are  indeterminate.   2. Right ventricular systolic function is mildly reduced.  The right  ventricular size is mildly enlarged.   3. Left atrial size was severely dilated.   4. Right atrial size was severely dilated.   5. Large pericardial effusion surrounds heart, most prominent posterior  to LV (42 mm in maximal dimension) There is no RV or RA collapse. Does not  appear to be hemodynamically destabilizing. COmpared to previous echo from  April 2025, no signficant  changechange.   6. 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 There are several jets of MR.  Moderate mitral valve regurgitation.   7. Tricuspid valve regurgitation is moderate to severe.   8. The aortic valve is tricuspid. Aortic valve regurgitation is mild.   9. The inferior vena cava is dilated in size with >50% respiratory  variability, suggesting right atrial pressure of 8 mmHg.     Assessment & Plan   1. Pericardial effusion   2. Typical atrial flutter (HCC)   3. Acquired thrombophilia (HCC)   4. OSA (obstructive sleep apnea)   5. Hypercholesterolemia   6. Essential hypertension   7. Thyroid  nodule         Large pericardial effusion: This appears to remain unchanged in size, asymptomatic and unexplained.  Not able to easily approach this for pericardiocentesis since most of the fluid is posterior.  There is no compelling reason to proceed with pericardiocentesis or surgical window at this time.  Colchicine  had no positive impact and inflammatory markers are negative, so I am not sure Arcalyst would help either.  Continue to monitor, check a limited echo in another month Atrial flutter: He has not had any symptomatic bradycardia since his last appointment.  When he wore the arrhythmia monitor there was no evidence of severe bradycardia except for a single nocturnal 3.3-second pause.  He has not had syncope since May 2024.  No history of stroke or other embolic events. CHADSVasc 3 (age, HTN).  Appropriately anticoagulated.  Eventually will likely need a pacemaker, but this does not appear to be  imminent. Eliquis : Denies bleeding complications. OSA: Denies daytime hypersomnolence.  Uses CPAP conscientiously. HLP: LDL target less than 100 in the absence of known CAD or PAD.  Continue same medications. HTN: His blood pressure slightly high today but he reports that at home it is consistently in the 130s/70s.  No changes were made to his medicines today, but I asked him to send me a log of  his blood pressure over the next week. Thyroid  nodule: Clinically euthyroid.  Normal TSH.  Incidentally discovered nodule on CT of the chest.  Ultrasound confirms a right inferior lobe nodule, solid and isoechoic, measuring up to 2.8 cm in maximum diameter, TI-RADS risk criteria TR 3.  Fine-needle aspiration recommended.  Referred him to Dr. Eletha.  I think he can safely stop the Eliquis  for 3 days to allow for lower bleeding risk with FNA.      Patient Instructions  Medication Instructions:  No changes *If you need a refill on your cardiac medications before your next appointment, please call your pharmacy*  Lab Work: None ordered If you have labs (blood work) drawn today and your tests are completely normal, you will receive your results only by: MyChart Message (if you have MyChart) OR A paper copy in the mail If you have any lab test that is abnormal or we need to change your treatment, we will call you to review the results.  Testing/Procedures: Your physician has requested that you have a Limited echocardiogram in October. Echocardiography is a painless test that uses sound waves to create images of your heart. It provides your doctor with information about the size and shape of your heart and how well your heart's chambers and valves are working. This procedure takes approximately one hour. There are no restrictions for this procedure. Please do NOT wear cologne, perfume, aftershave, or lotions (deodorant is allowed). Please arrive 15 minutes prior to your appointment time.  Please note: We  ask at that you not bring children with you during ultrasound (echo/ vascular) testing. Due to room size and safety concerns, children are not allowed in the ultrasound rooms during exams. Our front office staff cannot provide observation of children in our lobby area while testing is being conducted. An adult accompanying a patient to their appointment will only be allowed in the ultrasound room at the discretion of the ultrasound technician under special circumstances. We apologize for any inconvenience.   Follow-Up: At [facility] you and your health needs are our priority.  As part of our continuing mission to provide you with exceptional heart care, our providers are all part of one team.  This team includes your primary Cardiologist (physician) and Advanced Practice Providers or APPs (Physician Assistants and Nurse Practitioners) who all work together to provide you with the care you need, when you need it.  Your next appointment:   1 year(s)  Provider:   Jerel Balding, MD    We recommend signing up for the patient portal called MyChart.  Sign up information is provided on this After Visit Summary.  MyChart is used to connect with patients for Virtual Visits (Telemedicine).  Patients are able to view lab/test results, encounter notes, upcoming appointments, etc.  Non-urgent messages can be sent to your provider as well.   To learn more about what you can do with MyChart, go to [email or web address].        Jerel Balding, MD, FACC [facility] [phone] office [phone] pager

## 2024-01-04 NOTE — Patient Instructions (Signed)
 Medication Instructions:  No changes *If you need a refill on your cardiac medications before your next appointment, please call your pharmacy*  Lab Work: None ordered If you have labs (blood work) drawn today and your tests are completely normal, you will receive your results only by: MyChart Message (if you have MyChart) OR A paper copy in the mail If you have any lab test that is abnormal or we need to change your treatment, we will call you to review the results.  Testing/Procedures: Your physician has requested that you have a Limited echocardiogram in October. Echocardiography is a painless test that uses sound waves to create images of your heart. It provides your doctor with information about the size and shape of your heart and how well your heart's chambers and valves are working. This procedure takes approximately one hour. There are no restrictions for this procedure. Please do NOT wear cologne, perfume, aftershave, or lotions (deodorant is allowed). Please arrive 15 minutes prior to your appointment time.  Please note: We ask at that you not bring children with you during ultrasound (echo/ vascular) testing. Due to room size and safety concerns, children are not allowed in the ultrasound rooms during exams. Our front office staff cannot provide observation of children in our lobby area while testing is being conducted. An adult accompanying a patient to their appointment will only be allowed in the ultrasound room at the discretion of the ultrasound technician under special circumstances. We apologize for any inconvenience.   Follow-Up: At [facility], you and your health needs are our priority.  As part of our continuing mission to provide you with exceptional heart care, our providers are all part of one team.  This team includes your primary Cardiologist (physician) and Advanced Practice Providers or APPs (Physician Assistants and Nurse Practitioners) who all work  together to provide you with the care you need, when you need it.  Your next appointment:   1 year(s)  Provider:   Jerel Balding, MD    We recommend signing up for the patient portal called MyChart.  Sign up information is provided on this After Visit Summary.  MyChart is used to connect with patients for Virtual Visits (Telemedicine).  Patients are able to view lab/test results, encounter notes, upcoming appointments, etc.  Non-urgent messages can be sent to your provider as well.   To learn more about what you can do with MyChart, go to [email or web address].

## 2024-01-15 DIAGNOSIS — E041 Nontoxic single thyroid nodule: Secondary | ICD-10-CM | POA: Diagnosis not present

## 2024-01-16 ENCOUNTER — Other Ambulatory Visit: Payer: Self-pay | Admitting: Surgery

## 2024-01-16 DIAGNOSIS — E041 Nontoxic single thyroid nodule: Secondary | ICD-10-CM

## 2024-01-29 DIAGNOSIS — C44729 Squamous cell carcinoma of skin of left lower limb, including hip: Secondary | ICD-10-CM | POA: Diagnosis not present

## 2024-01-29 DIAGNOSIS — L812 Freckles: Secondary | ICD-10-CM | POA: Diagnosis not present

## 2024-01-29 DIAGNOSIS — Z85828 Personal history of other malignant neoplasm of skin: Secondary | ICD-10-CM | POA: Diagnosis not present

## 2024-01-29 DIAGNOSIS — L570 Actinic keratosis: Secondary | ICD-10-CM | POA: Diagnosis not present

## 2024-01-29 DIAGNOSIS — D692 Other nonthrombocytopenic purpura: Secondary | ICD-10-CM | POA: Diagnosis not present

## 2024-01-29 DIAGNOSIS — L821 Other seborrheic keratosis: Secondary | ICD-10-CM | POA: Diagnosis not present

## 2024-01-29 DIAGNOSIS — D485 Neoplasm of uncertain behavior of skin: Secondary | ICD-10-CM | POA: Diagnosis not present

## 2024-02-06 ENCOUNTER — Other Ambulatory Visit (HOSPITAL_COMMUNITY)
Admission: RE | Admit: 2024-02-06 | Discharge: 2024-02-06 | Disposition: A | Discharge status: Home / Self Care | Source: Ambulatory Visit | Attending: Surgery | Admitting: Surgery

## 2024-02-06 ENCOUNTER — Ambulatory Visit
Admission: RE | Admit: 2024-02-06 | Discharge: 2024-02-06 | Disposition: A | Source: Ambulatory Visit | Attending: Surgery | Admitting: Surgery

## 2024-02-06 DIAGNOSIS — E041 Nontoxic single thyroid nodule: Secondary | ICD-10-CM | POA: Insufficient documentation

## 2024-02-08 LAB — CYTOLOGY - NON PAP

## 2024-02-12 ENCOUNTER — Ambulatory Visit: Payer: Self-pay | Admitting: Surgery

## 2024-02-12 DIAGNOSIS — R32 Unspecified urinary incontinence: Secondary | ICD-10-CM | POA: Diagnosis not present

## 2024-02-12 DIAGNOSIS — E669 Obesity, unspecified: Secondary | ICD-10-CM | POA: Diagnosis not present

## 2024-02-12 DIAGNOSIS — Z7901 Long term (current) use of anticoagulants: Secondary | ICD-10-CM | POA: Diagnosis not present

## 2024-02-12 DIAGNOSIS — E049 Nontoxic goiter, unspecified: Secondary | ICD-10-CM | POA: Diagnosis not present

## 2024-02-12 DIAGNOSIS — I4892 Unspecified atrial flutter: Secondary | ICD-10-CM | POA: Diagnosis not present

## 2024-02-12 DIAGNOSIS — D6869 Other thrombophilia: Secondary | ICD-10-CM | POA: Diagnosis not present

## 2024-02-12 DIAGNOSIS — M109 Gout, unspecified: Secondary | ICD-10-CM | POA: Diagnosis not present

## 2024-02-12 DIAGNOSIS — G4733 Obstructive sleep apnea (adult) (pediatric): Secondary | ICD-10-CM | POA: Diagnosis not present

## 2024-02-12 DIAGNOSIS — I700 Atherosclerosis of aorta: Secondary | ICD-10-CM | POA: Diagnosis not present

## 2024-02-12 DIAGNOSIS — H348392 Tributary (branch) retinal vein occlusion, unspecified eye, stable: Secondary | ICD-10-CM | POA: Diagnosis not present

## 2024-02-12 DIAGNOSIS — R001 Bradycardia, unspecified: Secondary | ICD-10-CM | POA: Diagnosis not present

## 2024-02-12 DIAGNOSIS — I10 Essential (primary) hypertension: Secondary | ICD-10-CM | POA: Diagnosis not present

## 2024-02-12 DIAGNOSIS — R600 Localized edema: Secondary | ICD-10-CM | POA: Diagnosis not present

## 2024-02-12 DIAGNOSIS — E785 Hyperlipidemia, unspecified: Secondary | ICD-10-CM | POA: Diagnosis not present

## 2024-02-12 DIAGNOSIS — I4891 Unspecified atrial fibrillation: Secondary | ICD-10-CM | POA: Diagnosis not present

## 2024-02-12 DIAGNOSIS — R7303 Prediabetes: Secondary | ICD-10-CM | POA: Diagnosis not present

## 2024-02-12 DIAGNOSIS — I359 Nonrheumatic aortic valve disorder, unspecified: Secondary | ICD-10-CM | POA: Diagnosis not present

## 2024-02-12 DIAGNOSIS — N400 Enlarged prostate without lower urinary tract symptoms: Secondary | ICD-10-CM | POA: Diagnosis not present

## 2024-02-12 NOTE — Progress Notes (Signed)
 Unfortunately the biopsy did not obtain enough tissue for evaluation.  It will have to be repeated.  Burnard - please schedule a repeat biopsy with radiology.  Krystal Spinner, MD [facility] practice Office: [phone]

## 2024-02-15 ENCOUNTER — Other Ambulatory Visit: Payer: Self-pay | Admitting: Family Medicine

## 2024-02-20 ENCOUNTER — Encounter: Payer: Medicare HMO | Admitting: Family Medicine

## 2024-02-21 ENCOUNTER — Other Ambulatory Visit: Payer: Self-pay | Admitting: Family Medicine

## 2024-02-21 DIAGNOSIS — N400 Enlarged prostate without lower urinary tract symptoms: Secondary | ICD-10-CM

## 2024-02-25 ENCOUNTER — Other Ambulatory Visit: Payer: Self-pay | Admitting: Surgery

## 2024-02-25 ENCOUNTER — Ambulatory Visit (HOSPITAL_COMMUNITY)
Admission: RE | Admit: 2024-02-25 | Discharge: 2024-02-25 | Disposition: A | Discharge status: Home / Self Care | Source: Ambulatory Visit | Attending: Cardiology | Admitting: Cardiology

## 2024-02-25 ENCOUNTER — Ambulatory Visit: Payer: Self-pay | Admitting: Cardiovascular Disease

## 2024-02-25 DIAGNOSIS — E041 Nontoxic single thyroid nodule: Secondary | ICD-10-CM

## 2024-02-25 DIAGNOSIS — I350 Nonrheumatic aortic (valve) stenosis: Secondary | ICD-10-CM

## 2024-02-25 DIAGNOSIS — I3139 Other pericardial effusion (noninflammatory): Secondary | ICD-10-CM | POA: Diagnosis not present

## 2024-02-25 LAB — ECHOCARDIOGRAM LIMITED
Area-P 1/2: 3.67 cm2
S' Lateral: 3.2 cm

## 2024-02-26 ENCOUNTER — Ambulatory Visit: Admitting: Family Medicine

## 2024-02-26 ENCOUNTER — Encounter: Payer: Self-pay | Admitting: Family Medicine

## 2024-02-26 ENCOUNTER — Ambulatory Visit: Payer: Self-pay | Admitting: Family Medicine

## 2024-02-26 VITALS — BP 122/60 | HR 58 | Temp 97.6°F | Ht 74.8 in | Wt 243.4 lb

## 2024-02-26 DIAGNOSIS — Z23 Encounter for immunization: Secondary | ICD-10-CM

## 2024-02-26 DIAGNOSIS — I10 Essential (primary) hypertension: Secondary | ICD-10-CM

## 2024-02-26 DIAGNOSIS — Z0000 Encounter for general adult medical examination without abnormal findings: Secondary | ICD-10-CM

## 2024-02-26 DIAGNOSIS — I483 Typical atrial flutter: Secondary | ICD-10-CM | POA: Diagnosis not present

## 2024-02-26 DIAGNOSIS — E7800 Pure hypercholesterolemia, unspecified: Secondary | ICD-10-CM | POA: Diagnosis not present

## 2024-02-26 DIAGNOSIS — R7303 Prediabetes: Secondary | ICD-10-CM | POA: Diagnosis not present

## 2024-02-26 LAB — COMPREHENSIVE METABOLIC PANEL WITH GFR
ALT: 17 U/L (ref 0–53)
AST: 19 U/L (ref 0–37)
Albumin: 3.9 g/dL (ref 3.5–5.2)
Alkaline Phosphatase: 67 U/L (ref 39–117)
BUN: 43 mg/dL — ABNORMAL HIGH (ref 6–23)
CO2: 27 meq/L (ref 19–32)
Calcium: 8.6 mg/dL (ref 8.4–10.5)
Chloride: 107 meq/L (ref 96–112)
Creatinine, Ser: 1.67 mg/dL — ABNORMAL HIGH (ref 0.40–1.50)
GFR: 36.98 mL/min — ABNORMAL LOW (ref >60.00)
Glucose, Bld: 100 mg/dL — ABNORMAL HIGH (ref 70–99)
Potassium: 4.8 meq/L (ref 3.5–5.1)
Sodium: 142 meq/L (ref 135–145)
Total Bilirubin: 0.7 mg/dL (ref 0.2–1.2)
Total Protein: 6.1 g/dL (ref 6.0–8.3)

## 2024-02-26 LAB — CBC WITH DIFFERENTIAL/PLATELET
Basophils Absolute: 0 K/uL (ref 0.0–0.1)
Basophils Relative: 0.4 % (ref 0.0–3.0)
Eosinophils Absolute: 0.1 K/uL (ref 0.0–0.7)
Eosinophils Relative: 2.1 % (ref 0.0–5.0)
HCT: 40.5 % (ref 39.0–52.0)
Hemoglobin: 13.3 g/dL (ref 13.0–17.0)
Lymphocytes Relative: 18.3 % (ref 12.0–46.0)
Lymphs Abs: 1 K/uL (ref 0.7–4.0)
MCHC: 32.9 g/dL (ref 30.0–36.0)
MCV: 97.9 fl (ref 78.0–100.0)
Monocytes Absolute: 0.6 K/uL (ref 0.1–1.0)
Monocytes Relative: 10.7 % (ref 3.0–12.0)
Neutro Abs: 3.6 K/uL (ref 1.4–7.7)
Neutrophils Relative %: 68.5 % (ref 43.0–77.0)
Platelets: 142 K/uL — ABNORMAL LOW (ref 150.0–400.0)
RBC: 4.14 Mil/uL — ABNORMAL LOW (ref 4.22–5.81)
RDW: 14.9 % (ref 11.5–15.5)
WBC: 5.2 K/uL (ref 4.0–10.5)

## 2024-02-26 LAB — HEMOGLOBIN A1C: Hgb A1c MFr Bld: 6.1 % (ref 4.6–6.5)

## 2024-02-26 LAB — LIPID PANEL
Cholesterol: 145 mg/dL (ref 0–200)
HDL: 37.2 mg/dL — ABNORMAL LOW (ref >39.00)
LDL Cholesterol: 90 mg/dL (ref 0–99)
NonHDL: 108.23
Total CHOL/HDL Ratio: 4
Triglycerides: 93 mg/dL (ref 0.0–149.0)
VLDL: 18.6 mg/dL (ref 0.0–40.0)

## 2024-02-26 MED ORDER — AMLODIPINE BESYLATE 2.5 MG PO TABS: 2.5000 mg | ORAL_TABLET | Freq: Every day | ORAL | 3 refills | Status: AC

## 2024-02-26 MED ORDER — APIXABAN 5 MG PO TABS: 5.0000 mg | ORAL_TABLET | Freq: Two times a day (BID) | ORAL | 3 refills | Status: DC

## 2024-02-26 NOTE — Progress Notes (Signed)
 Established Patient Office Visit  Subjective   Patient ID: Gary [name], male    DOB: 1936-02-24  Age: 88 y.o. MRN: [id number]  Chief Complaint  Patient presents with   Annual Exam    HPI   [name] is seen today for annual physical exam.  Generally doing well.  He has large pericardial effusion being followed by cardiology.  This was essentially unchanged by recent echo.  He has some mild dizziness with climbing stairs but no recent change of symptoms.  He has other medical problems including hypertension, obstructive sleep apnea, prediabetes, gout, hyperlipidemia.  No recent gout flareups.  Remains on allopurinol  for prophylaxis.  Blood pressure remains quite labile.  Generally well-controlled on lisinopril .  He has occasional stretches where his blood pressure will get up to 180s systolic and he had some leftover amlodipine  2.5 mg which she sometimes has taken for 2 or 3 days until blood pressure comes back down.  He cannot correlate blood pressure lability with any lifestyle issues such as sodium intake.  He remains on Eliquis  for A-fib.  No recent bleeding issues.  Health maintenance reviewed:  Health Maintenance  Topic Date Due   Diabetic kidney evaluation - Urine ACR  Never done   COVID-19 Vaccine (3 - Pfizer risk series) 07/27/2019   HEMOGLOBIN A1C  08/20/2023   Influenza Vaccine  11/30/2023   Medicare Annual Wellness (AWV)  05/29/2024   OPHTHALMOLOGY EXAM  06/28/2024   Diabetic kidney evaluation - eGFR measurement  09/04/2024   DTaP/Tdap/Td (3 - Td or Tdap) 05/13/2026   Pneumococcal Vaccine: 50+ Years  Completed   Zoster Vaccines- Shingrix   Completed   Meningococcal B Vaccine  Aged Out   FOOT EXAM  Discontinued   Colonoscopy  Discontinued   - Needs flu vaccine and he consents to that today -We have recommend he consider RSV vaccine as well  Social history-widowed.  He spends quite a bit of time at his beach house in [place].  Exercises fairly regularly.   Non-smoker.  No regular alcohol.  Family History  Problem Relation Age of Onset   Diabetes Mother    Cancer Father    Liver cancer Father    Hypotension Sister    CAD Brother    Stomach cancer Paternal Aunt    Past Medical History:  Diagnosis Date   Adenomatous colon polyp 06/2000   ALLERGIC RHINITIS 10/03/2007   Allergy    seasonal   Arthritis    knees   Atrial flutter (HCC) 06/26/2014   Cancer (HCC)    skin cancers removed in the past basal cell and squamous cell   CARDIAC MURMUR, AORTIC 10/03/2007   Colon polyps    ERECTILE DYSFUNCTION 10/03/2007   High blood pressure 03/2016   HYPERLIPIDEMIA 10/03/2007   Rosacea    Sleep apnea    Past Surgical History:  Procedure Laterality Date   ADENOIDECTOMY     CARDIOVERSION N/A 07/01/2014   Procedure: CARDIOVERSION;  Surgeon: Jerel Balding, MD;  Location: [facility] ENDOSCOPY;  Service: Cardiovascular;  Laterality: N/A;   COLONOSCOPY     PALATE SURGERY     RIGHT HEART CATH N/A 09/11/2022   Procedure: RIGHT HEART CATH;  Surgeon: Court Dorn PARAS, MD;  Location: [facility] INVASIVE CV LAB;  Service: Cardiovascular;  Laterality: N/A;   TEE WITHOUT CARDIOVERSION N/A 07/01/2014   Procedure: TRANSESOPHAGEAL ECHOCARDIOGRAM (TEE);  Surgeon: Jerel Balding, MD;  Location: [facility] ENDOSCOPY;  Service: Cardiovascular;  Laterality: N/A;   TONSILLECTOMY  TOTAL KNEE ARTHROPLASTY Right 08/27/2017   Procedure: RIGHT TOTAL KNEE ARTHROPLASTY;  Surgeon: Rubie Kemps, MD;  Location: [facility] OR;  Service: Orthopedics;  Laterality: Right;    reports that he has quit smoking. He has never used smokeless tobacco. He reports current alcohol use of about 8.0 standard drinks of alcohol per week. He reports that he does not use drugs. family history includes CAD in his brother; Cancer in his father; Diabetes in his mother; Hypotension in his sister; Liver cancer in his father; Stomach cancer in his paternal aunt. No Known Allergies  Review of Systems  Constitutional:  Negative  for chills, fever, malaise/fatigue and weight loss.  Eyes:  Negative for blurred vision and double vision.  Respiratory:  Negative for cough and shortness of breath.   Cardiovascular:  Negative for chest pain, palpitations and leg swelling.  Gastrointestinal:  Negative for abdominal pain, blood in stool, constipation and diarrhea.  Genitourinary:  Negative for dysuria.  Skin:  Negative for rash.  Neurological:  Negative for speech change, seizures, loss of consciousness and headaches.  Psychiatric/Behavioral:  Negative for depression.       Objective:     BP 122/60   Pulse (!) 58   Temp 97.6 F (36.4 C) (Oral)   Ht 6' 2.8 (1.9 m)   Wt 243 lb 6.4 oz (110.4 kg)   SpO2 97%   BMI 30.58 kg/m  BP Readings from Last 3 Encounters:  02/26/24 122/60  01/04/24 (!) 158/77  10/16/23 122/72   Wt Readings from Last 3 Encounters:  02/26/24 243 lb 6.4 oz (110.4 kg)  01/04/24 257 lb (116.6 kg)  10/16/23 248 lb (112.5 kg)      Physical Exam Vitals reviewed.  Constitutional:      General: He is not in acute distress.    Appearance: He is well-developed. He is not ill-appearing.  HENT:     Right Ear: External ear normal.     Left Ear: External ear normal.  Eyes:     Pupils: Pupils are equal, round, and reactive to light.  Neck:     Thyroid : No thyromegaly.  Cardiovascular:     Rate and Rhythm: Normal rate.     Comments: Irregular rhythm consistent with chronic atrial fibrillation.  Rate stable at 58-60 Pulmonary:     Effort: Pulmonary effort is normal. No respiratory distress.     Breath sounds: Normal breath sounds. No wheezing or rales.  Abdominal:     Comments: small ventral hernia.  Otherwise no masses.  Soft and nontender  Musculoskeletal:     Cervical back: Neck supple.     Right lower leg: No edema.     Left lower leg: No edema.  Neurological:     Mental Status: He is alert and oriented to person, place, and time.      No results found for any visits on  02/26/24.    The ASCVD Risk score (Arnett DK, et al., 2019) failed to calculate for the following reasons:   The 2019 ASCVD risk score is only valid for ages 52 to 84    Assessment & Plan:   Problem List Items Addressed This Visit       Unprioritized   Typical atrial flutter (HCC)   Relevant Medications   apixaban  (ELIQUIS ) 5 MG TABS tablet   amLODipine  (NORVASC ) 2.5 MG tablet   Essential hypertension - Primary   Relevant Medications   apixaban  (ELIQUIS ) 5 MG TABS tablet   amLODipine  (NORVASC ) 2.5 MG tablet  Hypercholesterolemia   Relevant Medications   apixaban  (ELIQUIS ) 5 MG TABS tablet   amLODipine  (NORVASC ) 2.5 MG tablet   Other Relevant Orders   CMP   Lipid panel   Other Visit Diagnoses       Physical exam       Relevant Orders   CBC with Differential/Platelet     Prediabetes       Relevant Orders   Hemoglobin A56c     88 year old male here for annual well visit.  He has chronic issues as above.  Refilled his Eliquis  for 1 year.  He has sporadic elevations of blood pressure up to 180 systolic which occur infrequently.  We did write for amlodipine  2.5 mg to take for consistent blood pressure over 160 systolic.  Otherwise managed with lisinopril  10 mg daily.  -Recommend flu vaccine and patient consents - Recommend RSV vaccine and he will get at local pharmacy. - Other vaccines up-to-date - Aged out of further colonoscopy. - He had questions regarding PSA screening we explained that this is generally not recommended 87 and he agrees to deferring at this time.  No follow-ups on file.    Wolm Scarlet, MD

## 2024-02-26 NOTE — Addendum Note (Signed)
 Addended by: METTA KRISTEN CROME on: 02/26/2024 09:42 AM   Modules accepted: Orders

## 2024-02-26 NOTE — Patient Instructions (Signed)
 Consider RSV vaccine at pharmacy  Consider taking Amlodipine  2.5 mg once daily for systolic BP (top number) > 160.

## 2024-03-13 ENCOUNTER — Other Ambulatory Visit: Payer: Self-pay | Admitting: Family Medicine

## 2024-03-13 DIAGNOSIS — N400 Enlarged prostate without lower urinary tract symptoms: Secondary | ICD-10-CM

## 2024-03-21 ENCOUNTER — Other Ambulatory Visit (HOSPITAL_COMMUNITY)
Admission: RE | Admit: 2024-03-21 | Discharge: 2024-03-21 | Disposition: A | Discharge status: Home / Self Care | Source: Ambulatory Visit | Attending: Surgery | Admitting: Surgery

## 2024-03-21 ENCOUNTER — Ambulatory Visit
Admission: RE | Admit: 2024-03-21 | Discharge: 2024-03-21 | Disposition: A | Discharge status: Home / Self Care | Source: Ambulatory Visit | Attending: Surgery | Admitting: Surgery

## 2024-03-21 DIAGNOSIS — E041 Nontoxic single thyroid nodule: Secondary | ICD-10-CM | POA: Diagnosis not present

## 2024-03-24 ENCOUNTER — Other Ambulatory Visit

## 2024-03-25 ENCOUNTER — Ambulatory Visit: Payer: Self-pay | Admitting: Surgery

## 2024-03-25 LAB — CYTOLOGY - NON PAP

## 2024-03-25 NOTE — Progress Notes (Signed)
 Good news!  FNA biopsy is benign.  Will plan follow up in one year with a repeat thyroid  USN and TSH level, followed by exam in the office.  Burnard - please contact patient and arrange follow up.  Krystal Spinner, MD [facility] practice Office: [phone]

## 2024-04-09 ENCOUNTER — Other Ambulatory Visit: Payer: Self-pay | Admitting: Family Medicine

## 2024-05-04 ENCOUNTER — Other Ambulatory Visit: Payer: Self-pay | Admitting: Cardiovascular Disease

## 2024-05-05 ENCOUNTER — Telehealth: Payer: Self-pay | Admitting: Cardiovascular Disease

## 2024-05-05 NOTE — Telephone Encounter (Signed)
" °  Pt c/o Shortness Of Breath: STAT if SOB developed within the last 24 hours or pt is noticeably SOB on the phone  1. Are you currently SOB (can you hear that pt is SOB on the phone)? no  2. How long have you been experiencing SOB? Since Thanksgiving  3. Are you SOB when sitting or when up moving around? Mostly when moving around  4. Are you currently experiencing any other symptoms? Fatigue, lightheadedness  "

## 2024-05-05 NOTE — Telephone Encounter (Signed)
 Spoke with pt regarding his shortness of breath. Pt stated that he has noticed since thanksgiving that he is becoming short of breath with activity such as climbing the stairs or if he moves too quickly. Pt stated he has been getting dizzy when he moves too quickly as well. The pt stated his blood pressure has been running 140s/70s and denies any headaches, nausea or vomiting. Pt denied having any chest pain. Pt was scheduled to see [facility] 1/6. Pt was given ED precautions. Pt verbalized understanding. All questions if any were answered.

## 2024-05-06 ENCOUNTER — Encounter: Payer: Self-pay | Admitting: Emergency Medicine

## 2024-05-06 ENCOUNTER — Ambulatory Visit: Attending: Emergency Medicine | Admitting: Emergency Medicine

## 2024-05-06 VITALS — BP 144/82 | HR 51 | Ht 74.0 in | Wt 256.0 lb

## 2024-05-06 DIAGNOSIS — I272 Pulmonary hypertension, unspecified: Secondary | ICD-10-CM | POA: Diagnosis not present

## 2024-05-06 DIAGNOSIS — E7800 Pure hypercholesterolemia, unspecified: Secondary | ICD-10-CM | POA: Diagnosis not present

## 2024-05-06 DIAGNOSIS — I10 Essential (primary) hypertension: Secondary | ICD-10-CM | POA: Diagnosis not present

## 2024-05-06 DIAGNOSIS — G4733 Obstructive sleep apnea (adult) (pediatric): Secondary | ICD-10-CM | POA: Diagnosis not present

## 2024-05-06 DIAGNOSIS — I483 Typical atrial flutter: Secondary | ICD-10-CM

## 2024-05-06 DIAGNOSIS — I3139 Other pericardial effusion (noninflammatory): Secondary | ICD-10-CM

## 2024-05-06 NOTE — Progress Notes (Addendum)
 " Cardiology Office Note:    Date:  05/06/2024  ID:  Gary [name], DOB November 14, 1935, MRN [id number] PCP: Micheal Wolm ORN, MD  [facility] Providers Cardiologist:  Jerel Balding, MD       Patient Profile:       Chief Complaint: Acute visit for shortness of breath and lightheadedness History of Present Illness:  Gary [name] is a 89 y.o. male with visit-pertinent history of large pericardial effusion, permanent atrial flutter with slow ventricular response, hypertension, OSA on CPAP  He has history of a large pericardial effusion diagnosed during hospitalization for syncope in May 2024 with unclear etiology and no evidence of tamponade by right heart catheterization and negative markers for autoimmune disease.  Chest CT did not show any evidence of malignancy.  The effusion has remained largely unchanged during the past year of follow-up and did not decrease in size with colchicine  therapy which had been subsequently stopped.  After a syncopal event and hospitalization he did wear a 14-day monitor that showed persistent atrial flutter with average ventricular rate of 55 bpm.  He did have several nocturnal pauses lasting up to 3.3 seconds, nonsymptomatic, none in excess of 5 seconds.  He did not have any syncope in over a year of subsequent follow-up.  Last seen in clinic on 01/04/2024 by Dr. Balding.  Reports there is no compelling reason to proceed with pericardiocentesis or surgical window at this time.  No medication changes were made.  He was to follow-up in 1 year.  Underwent limited echocardiogram on 02/25/2024 showing large pericardial effusion posterior to the left ventricle and localized near the right atrium but no evidence of cardiac tamponade.  LVEF 60 to 65%, mild LVH, RV function is mildly reduced and mildly enlarged, moderately elevated PASP at 49.7 mmHg, left atrial size moderately dilated, right atrial size severely dilated, mild mitral valve regurgitation, mild to  moderate tricuspid valve regurgitation.  No significant changes from prior study.   Discussed the use of AI scribe software for clinical note transcription with the patient, who gave verbal consent to proceed.  History of Present Illness Gary [name] is an 89 year old male with pericardial effusion who presents with worsening shortness of breath and lightheadedness  Over the past month he has had worsening shortness of breath and lightheadedness with exertion such as fast walking or climbing stairs. He must slow his pace and symptoms resolve with sitting. He has no chest pain.  He has pericardial effusion with last echocardiogram in October. At home his heart rate is often in the 40s to 50s. He has atrial flutter and a remote fainting episode 2 years ago but no recent syncope or near syncope.  Over the past week he has had intermittent leg swelling.  He has had no weight gain since his last office visit.  He denies orthopnea or PND.  He denies hypotension and his home blood pressures are usually about 130s/70s.  He takes low-dose lisinopril  in the morning and recently started low-dose amlodipine  at night for hypertension.   Review of systems:  Please see the history of present illness. All other systems are reviewed and otherwise negative.      Studies Reviewed:    EKG Interpretation Date/Time:  Tuesday May 06 2024 15:26:02 EST Ventricular Rate:  51 PR Interval:    QRS Duration:  68 QT Interval:  432 QTC Calculation: 398 R Axis:   26  Text Interpretation: Atrial flutter with slow ventricular response Low  voltage QRS Cannot rule out Anteroseptal infarct , age undetermined When compared with ECG of 16-Oct-2023 09:20, Current undetermined rhythm precludes rhythm comparison, needs review QRS duration has decreased Minimal criteria for Anteroseptal infarct are now Present ST no longer depressed in Inferior leads Confirmed by Rana Dixon [phone]) on 05/06/2024 4:56:37 PM     Echocardiogram Limited 02/25/2024 1. Large pericardial effusion. The pericardial effusion is posterior to  the left ventricle and localized near the right atrium. There is no  evidence of cardiac tamponade.   2. Left ventricular ejection fraction, by estimation, is 60 to 65%. The  left ventricle has normal function. There is mild left ventricular  hypertrophy.   3. Right ventricular systolic function is mildly reduced. The right  ventricular size is mildly enlarged. There is moderately elevated  pulmonary artery systolic pressure. The estimated right ventricular  systolic pressure is 49.7 mmHg.   4. Left atrial size was moderately dilated.   5. Right atrial size was severely dilated.   6. The mitral valve is myxomatous. Mild mitral valve regurgitation.   7. Tricuspid valve regurgitation is mild to moderate.   8. Aortic valve regurgitation is trivial.   9. The inferior vena cava is dilated in size with >50% respiratory  variability, suggesting right atrial pressure of 8 mmHg.   Echocardiogram 11/19/2023  1. Left ventricular ejection fraction, by estimation, is 60 to 65%. The  left ventricle has normal function. The left ventricle has no regional  wall motion abnormalities. Left ventricular diastolic parameters are  indeterminate.   2. Right ventricular systolic function is mildly reduced. The right  ventricular size is mildly enlarged.   3. Left atrial size was severely dilated.   4. Right atrial size was severely dilated.   5. Large pericardial effusion surrounds heart, most prominent posterior  to LV (42 mm in maximal dimension) There is no RV or RA collapse. Does not  appear to be hemodynamically destabilizing. COmpared to previous echo from  April 2025, no signficant  changechange.   6. 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 There are several jets of MR.  Moderate mitral valve regurgitation.   7. Tricuspid valve regurgitation is moderate to severe.   8. The aortic valve is  tricuspid. Aortic valve regurgitation is mild.   9. The inferior vena cava is dilated in size with >50% respiratory  variability, suggesting right atrial pressure of 8 mmHg.   Echocardiogram 08/14/2023  1. Left ventricular ejection fraction, by estimation, is 60 to 65%. The  left ventricle has normal function. The left ventricle has no regional  wall motion abnormalities. Left ventricular diastolic function could not  be evaluated.   2. Right ventricular systolic function is normal. The right ventricular  size is normal. There is mildly elevated pulmonary artery systolic  pressure. The estimated right ventricular systolic pressure is 44.5 mmHg.   3. Left atrial size was moderately dilated.   4. Right atrial size was severely dilated.   5. The mitral valve is degenerative. Trivial mitral valve regurgitation.  No evidence of mitral stenosis.   6. The aortic valve is tricuspid. Aortic valve regurgitation is mild.  Aortic valve sclerosis/calcification is present, without any evidence of  aortic stenosis.   7. Pulmonic valve regurgitation is moderate.   8. Aortic dilatation noted. There is mild dilatation of the aortic root,  measuring 39 mm. There is mild dilatation of the ascending aorta,  measuring 39 mm.   9. The inferior vena cava is normal in size with greater than  50%  respiratory variability, suggesting right atrial pressure of 3 mmHg.  10. Large pericardial effusion. The pericardial effusion is posterior to  the left ventricle and circumferential. The largest diameter is posterior  to the LV free wall measuring 4.25cm. There is no evidence of cardiac  tamponade.  11. Compared to echo dated 01/31/2023, the pericardial effusion has  increased in size posterior to the LV.  Risk Assessment/Calculations:    CHA2DS2-VASc Score =     This indicates a  % annual risk of stroke. The patient's score is based upon:     HYPERTENSION CONTROL Vitals:   05/06/24 1522 05/06/24 1647  BP: (!)  150/86 (!) 144/82    The patient's blood pressure is elevated above target today.  In order to address the patient's elevated BP: Blood pressure will be monitored at home to determine if medication changes need to be made.           Physical Exam:   VS:  BP (!) 144/82   Pulse (!) 51   Ht 6' 2 (1.88 m)   Wt 256 lb (116.1 kg)   BMI 32.87 kg/m    Wt Readings from Last 3 Encounters:  05/06/24 256 lb (116.1 kg)  02/26/24 243 lb 6.4 oz (110.4 kg)  01/04/24 257 lb (116.6 kg)    GEN: Well nourished, well developed in no acute distress NECK: No JVD; No carotid bruits CARDIAC: Irregular rhythm, no murmurs, rubs, gallops RESPIRATORY:  Clear to auscultation without rales, wheezing or rhonchi  ABDOMEN: Soft, non-tender, non-distended EXTREMITIES:  No edema; No acute deformity      Assessment and Plan:  Large pericardial effusion History of large pericardial effusion that appears to remain unchanged in size and unexplained.  Echocardiograms 08/2022 through 01/2024 showed no change in size of effusion and LV function remains normal.  Most of the fluid is posterior so not able to easily approach for pericardiocentesis.  Colchicine  had no positive impact in the past and inflammatory markers were negative. - Today patient presents with worsening shortness of breath and lightheadedness with exertion such as fast-paced walking or walking up stairs that has been ongoing for the past month that resolves with rest.  He denies any chest pains or discomfort - On exam no distant heart sounds, tachycardia, tachypnea, hypotension, or JVD - No findings on exam to suggest cardiac tamponade.  However given his new concerning symptoms we will plan for stat limited echocardiogram to reevaluate his pericardial effusion and to rule out cardiac tamponade  Atrial flutter Today patient remains in atrial flutter with heart rate of 51 bpm Cardiac event monitor 09/2022 showed 7 pauses occurring, the longest lasting 3.3  seconds - Today he is without symptoms concerning for symptomatic bradycardia.  He has had no episodes of syncope or presyncope since May 2024 - May require pacemaker in the future but stable at this time - Continue Eliquis  5 mg twice daily  Hypertension Blood pressure today is elevated at 144/82 He reports home blood pressures average 130s over 70s.  He was recently started on amlodipine  by his PCP - Plan for patient to send blood pressure averages over 2 weeks over MyChart to further evaluate - Continue amlodipine  2.5 mg daily and lisinopril  10 mg daily  Obstructive sleep apnea - Remains compliant with CPAP  Hyperlipidemia LDL 90 on 01/2024 and well-controlled in the absence of of known CAD - Continue simvastatin  20 mg daily    Addendum 05/09/2024 Limited echocardiogram completed on 05/08/2024 shows intraventricular  septum flattened in systole and diastole consistent with right ventricular pressure and volume overload as well as severely elevated pulmonary artery pressure of 62.9 mmHg.  Pericardial effusion is posterior and very large measuring up to 4.6 cm with no evidence of tamponade and unchanged since 02/25/2024. - Discussed echocardiogram results with patient's primary cardiologist Dr. Francyne who recommended right heart catheterization and possible drainage of large pericardial effusion - Discussed results and right heart catheterization with patient on 05/09/2024.  He is willing to proceed.  We discussed possibility of admission to the hospital after right heart catheterization.  Informed Consent   Shared Decision Making/Informed Consent The risks, including but not limited to, [bleeding or vascular complications (1 in 500), pneumothorax (1 in 1600), arrhythmia (1 in 1000) and death (1 in 5000)], benefits (diagnostic support and/or management of heart failure, pulmonary hypertension) and alternatives of a right heart catheterization were discussed in detail with Gary [name] and he is  willing to proceed.         Dispo:  Return in about 6 weeks (around 06/17/2024).  Signed, Lum LITTIE Louis, NP  "

## 2024-05-06 NOTE — Patient Instructions (Addendum)
 Medication Instructions:  NO CHANGES  Lab Work: NONE TO BE DONE TODAY.  Testing/Procedures: Your physician has requested that you have an ECHOCARDIOGRAM. Echocardiography is a painless test that uses sound waves to create images of your heart. It provides your doctor with information about the size and shape of your heart and how well your hearts chambers and valves are working. This procedure takes approximately one hour. There are no restrictions for this procedure. Please do NOT wear cologne, perfume, aftershave, or lotions (deodorant is allowed). Please arrive 15 minutes prior to your appointment time.  Please note: We ask at that you not bring children with you during ultrasound (echo/ vascular) testing. Due to room size and safety concerns, children are not allowed in the ultrasound rooms during exams. Our front office staff cannot provide observation of children in our lobby area while testing is being conducted. An adult accompanying a patient to their appointment will only be allowed in the ultrasound room at the discretion of the ultrasound technician under special circumstances. We apologize for any inconvenience.   Follow-Up: At [facility], you and your health needs are our priority.  As part of our continuing mission to provide you with exceptional heart care, our providers are all part of one team.  This team includes your primary Cardiologist (physician) and Advanced Practice Providers or APPs (Physician Assistants and Nurse Practitioners) who all work together to provide you with the care you need, when you need it.  Your next appointment:   6 WEEKS POST ECHO  Provider:   MADISON FOUNTAIN, NP   Other:  Please check your blood pressure daily for two weeks, then contact the office with your readings  Please contact the office with your readings either by phone, by dropping it off in person, or by sending it through MyChart.   Be sure to check your blood  pressure one to two hours after taking your medications.  Avoid the following for 30 minutes before checking your blood pressure: No caffeine No alcohol No eating No smoking  No exercise  Five minutes before checking your blood pressure: Use the restroom Sit up straight in a chair with your back supported and feet flat on the floor Remain quiet and do not talk

## 2024-05-08 ENCOUNTER — Ambulatory Visit (HOSPITAL_COMMUNITY)
Admission: RE | Admit: 2024-05-08 | Discharge: 2024-05-08 | Disposition: A | Discharge status: Home / Self Care | Source: Ambulatory Visit | Attending: Cardiovascular Disease | Admitting: Cardiovascular Disease

## 2024-05-08 DIAGNOSIS — I340 Nonrheumatic mitral (valve) insufficiency: Secondary | ICD-10-CM

## 2024-05-08 DIAGNOSIS — I3139 Other pericardial effusion (noninflammatory): Secondary | ICD-10-CM | POA: Diagnosis present

## 2024-05-08 LAB — ECHOCARDIOGRAM LIMITED
Area-P 1/2: 4.77 cm2
P 1/2 time: 527 ms
S' Lateral: 3.3 cm

## 2024-05-09 ENCOUNTER — Other Ambulatory Visit: Payer: Self-pay | Admitting: *Deleted

## 2024-05-09 ENCOUNTER — Telehealth: Payer: Self-pay | Admitting: *Deleted

## 2024-05-09 ENCOUNTER — Encounter: Payer: Self-pay | Admitting: *Deleted

## 2024-05-09 DIAGNOSIS — Z79899 Other long term (current) drug therapy: Secondary | ICD-10-CM

## 2024-05-09 NOTE — Addendum Note (Signed)
 Addended by: Jeanmarc Viernes L on: 05/09/2024 04:11 PM   Modules accepted: Orders

## 2024-05-09 NOTE — Patient Instructions (Signed)
" °  [facility] A DEPT OF [place]. [facility] [address] A DEPT OF THE [place]. [facility] [address] [place] [address] Dept: [phone] Loc: [phone]  Gary [name]  05/09/2024  You are scheduled for a Cardiac Catheterization on FRIDAY, May 16, 2024 with Dr. LONNI CHARM CASH .  1. Please arrive at the [facility] (Main Entrance A) at [facility]: [address] [place], [place] [address] at 10:00 AM (This time is 2 hour(s) before your procedure to ensure your preparation).   Free valet parking service is available. You will check in at ADMITTING. The support person will be asked to wait in the waiting room.  It is OK to have someone drop you off and come back when you are ready to be discharged.    Special note: Every effort is made to have your procedure done on time. Please understand that emergencies sometimes delay scheduled procedures.  2. Diet: Nothing to eat after midnight.   3. Hydration: You need to be well hydrated before your procedure. On May 16, 2024, you may drink approved liquids (see below) until 2 hours before the procedure, with 16 oz of water  as your last intake.   List of approved liquids water , clear juice, clear tea, black coffee, fruit juices, non-citric and without pulp, carbonated beverages, Gatorade, Kool -Aid, plain Jello-O and plain ice popsicles.  4. Labs: You will need to have blood drawn on Monday, January 12 at [facility] (1st Floor), [address] [place], [place] [address]. You do not need to be fasting.  5. Medication instructions in preparation for your procedure:   Contrast Allergy: No  STOP TAKING ELIQUIS  (APIXABAN ) ON WEDNESDAY, May 14, 2024.  (LET LAST DOSE BE ON TUESDAY, May 13, 2024)  STOP TAKING LISINOPRIL  (ZESTRIL ) ON THURSDAY, May 15, 2024. (LET LAST DOSE BE ON WEDNESDAY, May 14, 2024)   On the  morning of your procedure, take any morning medicines NOT listed above.  You may use sips of water .  6. Plan to go home the same day, you will only stay overnight if medically necessary. 7. Bring a current list of your medications and current insurance cards. 8. You MUST have a responsible person to drive you home. 9. Someone MUST be with you the first 24 hours after you arrive home or your discharge will be delayed. 10. Please wear clothes that are easy to get on and off and wear slip-on shoes.  Thank you for allowing us  to care for you!   -- [facility]  "

## 2024-05-09 NOTE — Telephone Encounter (Signed)
 Lab orders have been put in and patient will be coming to the lab on 05-12-24. All instructions for CATH has been gone over with patient and he verbalized his understanding of everything moving forward. I also sent patient instructions for the CATH to patient via MyChart.

## 2024-05-09 NOTE — Progress Notes (Signed)
 Lab orders have been put in and patient will be coming to the lab on 05-12-24. All instructions for CATH has been gone over with patient and he verbalized his understanding of everything moving forward. I also seen patient instructions for the CATH to patient via MyChart.

## 2024-05-13 ENCOUNTER — Telehealth: Payer: Self-pay | Admitting: *Deleted

## 2024-05-13 NOTE — Telephone Encounter (Signed)
 Right Heart Cath scheduled at [facility] for: Friday May 16, 2024 12 Noon Arrival time [facility] Main Entrance A at: 10 AM  Diet: -May have light meal until 6 AM. (6 hours before procedure time) Approved light meal consists of plain toast, fruit, light soups, crackers.  Hydration: -May drink clear liquids until 2 hours before the procedure.  Approved liquids: Water , clear tea, black coffee, fruit juices-non-citric and without pulp,Gatorade, plain Jello/popsicles.  Right Heart Cath-no PO hydration (bottle of water )   Medication instructions: -Hold:  Eliquis -none 05/14/24 until post procedure  -Other usual morning medications can be taken.  Plan to go home the same day, you will only stay overnight if medically necessary.  You must have responsible adult to drive you home.  Someone must be with you the first 24 hours after you arrive home.  Left message for patient to call back-ask if he has had pre-procedure BMP/CBC.

## 2024-05-13 NOTE — Telephone Encounter (Signed)
 Pt tells me he will plan to get pre-procedure BMP [facility] today or tomorrow.

## 2024-05-14 ENCOUNTER — Telehealth: Payer: Self-pay

## 2024-05-14 ENCOUNTER — Other Ambulatory Visit: Payer: Self-pay

## 2024-05-14 DIAGNOSIS — Z79899 Other long term (current) drug therapy: Secondary | ICD-10-CM

## 2024-05-14 NOTE — Telephone Encounter (Signed)
 Released labs

## 2024-05-15 ENCOUNTER — Telehealth: Payer: Self-pay

## 2024-05-15 ENCOUNTER — Encounter (HOSPITAL_BASED_OUTPATIENT_CLINIC_OR_DEPARTMENT_OTHER): Payer: Self-pay

## 2024-05-15 ENCOUNTER — Inpatient Hospital Stay (HOSPITAL_BASED_OUTPATIENT_CLINIC_OR_DEPARTMENT_OTHER)
Admission: EM | Admit: 2024-05-15 | Discharge: 2024-05-18 | DRG: 287 | Disposition: A | Discharge status: Home / Self Care | Attending: Pulmonary Disease | Admitting: Pulmonary Disease

## 2024-05-15 ENCOUNTER — Other Ambulatory Visit: Payer: Self-pay

## 2024-05-15 ENCOUNTER — Ambulatory Visit: Payer: Self-pay | Admitting: Emergency Medicine

## 2024-05-15 ENCOUNTER — Emergency Department (HOSPITAL_BASED_OUTPATIENT_CLINIC_OR_DEPARTMENT_OTHER)

## 2024-05-15 ENCOUNTER — Emergency Department (HOSPITAL_BASED_OUTPATIENT_CLINIC_OR_DEPARTMENT_OTHER)
Admission: EM | Admit: 2024-05-15 | Discharge: 2024-05-15 | Disposition: A | Discharge status: Home / Self Care | Attending: Emergency Medicine | Admitting: Emergency Medicine

## 2024-05-15 DIAGNOSIS — I4819 Other persistent atrial fibrillation: Secondary | ICD-10-CM | POA: Diagnosis present

## 2024-05-15 DIAGNOSIS — N179 Acute kidney failure, unspecified: Secondary | ICD-10-CM | POA: Diagnosis present

## 2024-05-15 DIAGNOSIS — Z79899 Other long term (current) drug therapy: Secondary | ICD-10-CM | POA: Insufficient documentation

## 2024-05-15 DIAGNOSIS — N1832 Chronic kidney disease, stage 3b: Secondary | ICD-10-CM | POA: Diagnosis present

## 2024-05-15 DIAGNOSIS — J9811 Atelectasis: Secondary | ICD-10-CM | POA: Diagnosis present

## 2024-05-15 DIAGNOSIS — N401 Enlarged prostate with lower urinary tract symptoms: Secondary | ICD-10-CM | POA: Diagnosis present

## 2024-05-15 DIAGNOSIS — I2721 Secondary pulmonary arterial hypertension: Secondary | ICD-10-CM | POA: Diagnosis present

## 2024-05-15 DIAGNOSIS — Z96651 Presence of right artificial knee joint: Secondary | ICD-10-CM | POA: Diagnosis present

## 2024-05-15 DIAGNOSIS — Z7901 Long term (current) use of anticoagulants: Secondary | ICD-10-CM | POA: Insufficient documentation

## 2024-05-15 DIAGNOSIS — I10 Essential (primary) hypertension: Secondary | ICD-10-CM | POA: Diagnosis present

## 2024-05-15 DIAGNOSIS — Z87891 Personal history of nicotine dependence: Secondary | ICD-10-CM | POA: Diagnosis not present

## 2024-05-15 DIAGNOSIS — Z8249 Family history of ischemic heart disease and other diseases of the circulatory system: Secondary | ICD-10-CM

## 2024-05-15 DIAGNOSIS — R351 Nocturia: Secondary | ICD-10-CM | POA: Diagnosis present

## 2024-05-15 DIAGNOSIS — Z800 Family history of malignant neoplasm of digestive organs: Secondary | ICD-10-CM

## 2024-05-15 DIAGNOSIS — E875 Hyperkalemia: Secondary | ICD-10-CM | POA: Diagnosis present

## 2024-05-15 DIAGNOSIS — D696 Thrombocytopenia, unspecified: Secondary | ICD-10-CM | POA: Diagnosis present

## 2024-05-15 DIAGNOSIS — I50810 Right heart failure, unspecified: Secondary | ICD-10-CM | POA: Diagnosis present

## 2024-05-15 DIAGNOSIS — I3139 Other pericardial effusion (noninflammatory): Secondary | ICD-10-CM | POA: Diagnosis not present

## 2024-05-15 DIAGNOSIS — N289 Disorder of kidney and ureter, unspecified: Secondary | ICD-10-CM | POA: Insufficient documentation

## 2024-05-15 DIAGNOSIS — J90 Pleural effusion, not elsewhere classified: Secondary | ICD-10-CM | POA: Diagnosis present

## 2024-05-15 DIAGNOSIS — M109 Gout, unspecified: Secondary | ICD-10-CM | POA: Diagnosis present

## 2024-05-15 DIAGNOSIS — E7800 Pure hypercholesterolemia, unspecified: Secondary | ICD-10-CM | POA: Diagnosis present

## 2024-05-15 DIAGNOSIS — Z85828 Personal history of other malignant neoplasm of skin: Secondary | ICD-10-CM | POA: Insufficient documentation

## 2024-05-15 DIAGNOSIS — Z860101 Personal history of adenomatous and serrated colon polyps: Secondary | ICD-10-CM

## 2024-05-15 DIAGNOSIS — G4733 Obstructive sleep apnea (adult) (pediatric): Secondary | ICD-10-CM | POA: Diagnosis present

## 2024-05-15 DIAGNOSIS — I130 Hypertensive heart and chronic kidney disease with heart failure and stage 1 through stage 4 chronic kidney disease, or unspecified chronic kidney disease: Secondary | ICD-10-CM | POA: Diagnosis present

## 2024-05-15 DIAGNOSIS — I309 Acute pericarditis, unspecified: Secondary | ICD-10-CM | POA: Diagnosis present

## 2024-05-15 DIAGNOSIS — Z833 Family history of diabetes mellitus: Secondary | ICD-10-CM | POA: Diagnosis not present

## 2024-05-15 DIAGNOSIS — I483 Typical atrial flutter: Secondary | ICD-10-CM | POA: Diagnosis present

## 2024-05-15 DIAGNOSIS — I272 Pulmonary hypertension, unspecified: Secondary | ICD-10-CM | POA: Diagnosis not present

## 2024-05-15 LAB — CBC WITH DIFFERENTIAL/PLATELET
Abs Immature Granulocytes: 0.04 K/uL (ref 0.00–0.07)
Basophils Absolute: 0 K/uL (ref 0.0–0.1)
Basophils Relative: 1 %
Eosinophils Absolute: 0.2 K/uL (ref 0.0–0.5)
Eosinophils Relative: 2 %
HCT: 37.3 % — ABNORMAL LOW (ref 39.0–52.0)
Hemoglobin: 12.5 g/dL — ABNORMAL LOW (ref 13.0–17.0)
Immature Granulocytes: 1 %
Lymphocytes Relative: 13 %
Lymphs Abs: 1.1 K/uL (ref 0.7–4.0)
MCH: 32.2 pg (ref 26.0–34.0)
MCHC: 33.5 g/dL (ref 30.0–36.0)
MCV: 96.1 fL (ref 80.0–100.0)
Monocytes Absolute: 1.2 K/uL — ABNORMAL HIGH (ref 0.1–1.0)
Monocytes Relative: 14 %
Neutro Abs: 5.8 K/uL (ref 1.7–7.7)
Neutrophils Relative %: 69 %
Platelets: 129 K/uL — ABNORMAL LOW (ref 150–400)
RBC: 3.88 MIL/uL — ABNORMAL LOW (ref 4.22–5.81)
RDW: 14.1 % (ref 11.5–15.5)
WBC: 8.3 K/uL (ref 4.0–10.5)
nRBC: 0 % (ref 0.0–0.2)

## 2024-05-15 LAB — MAGNESIUM: Magnesium: 2 mg/dL (ref 1.7–2.4)

## 2024-05-15 LAB — URINALYSIS, ROUTINE W REFLEX MICROSCOPIC
Bacteria, UA: NONE SEEN
Bilirubin Urine: NEGATIVE
Glucose, UA: NEGATIVE mg/dL
Hgb urine dipstick: NEGATIVE
Ketones, ur: NEGATIVE mg/dL
Leukocytes,Ua: NEGATIVE
Nitrite: NEGATIVE
Protein, ur: >=300 mg/dL — AB
Specific Gravity, Urine: 1.016 (ref 1.005–1.030)
pH: 5 (ref 5.0–8.0)

## 2024-05-15 LAB — CBC
HCT: 38.2 % — ABNORMAL LOW (ref 39.0–52.0)
Hematocrit: 39.9 % (ref 37.5–51.0)
Hemoglobin: 12.7 g/dL — ABNORMAL LOW (ref 13.0–17.7)
Hemoglobin: 12.4 g/dL — ABNORMAL LOW (ref 13.0–17.0)
MCH: 32.1 pg (ref 26.6–33.0)
MCH: 31.9 pg (ref 26.0–34.0)
MCHC: 31.8 g/dL (ref 31.5–35.7)
MCHC: 32.5 g/dL (ref 30.0–36.0)
MCV: 101 fL — ABNORMAL HIGH (ref 79–97)
MCV: 98.2 fL (ref 80.0–100.0)
Platelets: 155 x10E3/uL (ref 150–450)
Platelets: 141 K/uL — ABNORMAL LOW (ref 150–400)
RBC: 3.96 x10E6/uL — ABNORMAL LOW (ref 4.14–5.80)
RBC: 3.89 MIL/uL — ABNORMAL LOW (ref 4.22–5.81)
RDW: 13.4 % (ref 11.6–15.4)
RDW: 14 % (ref 11.5–15.5)
WBC: 7.5 x10E3/uL (ref 3.4–10.8)
WBC: 10.5 K/uL (ref 4.0–10.5)
nRBC: 0 % (ref 0.0–0.2)

## 2024-05-15 LAB — BASIC METABOLIC PANEL WITH GFR
Anion gap: 10 (ref 5–15)
Anion gap: 12 (ref 5–15)
BUN/Creatinine Ratio: 30 — ABNORMAL HIGH (ref 10–24)
BUN: 55 mg/dL — ABNORMAL HIGH (ref 8–27)
BUN: 57 mg/dL — ABNORMAL HIGH (ref 8–23)
BUN: 58 mg/dL — ABNORMAL HIGH (ref 8–23)
CO2: 17 mmol/L — ABNORMAL LOW (ref 20–29)
CO2: 21 mmol/L — ABNORMAL LOW (ref 22–32)
CO2: 20 mmol/L — ABNORMAL LOW (ref 22–32)
Calcium: 8.8 mg/dL (ref 8.6–10.2)
Calcium: 8.6 mg/dL — ABNORMAL LOW (ref 8.9–10.3)
Calcium: 8.7 mg/dL — ABNORMAL LOW (ref 8.9–10.3)
Chloride: 107 mmol/L — ABNORMAL HIGH (ref 96–106)
Chloride: 106 mmol/L (ref 98–111)
Chloride: 106 mmol/L (ref 98–111)
Creatinine, Ser: 1.86 mg/dL — ABNORMAL HIGH (ref 0.76–1.27)
Creatinine, Ser: 1.7 mg/dL — ABNORMAL HIGH (ref 0.61–1.24)
Creatinine, Ser: 1.82 mg/dL — ABNORMAL HIGH (ref 0.61–1.24)
GFR, Estimated: 39 mL/min — ABNORMAL LOW
GFR, Estimated: 36 mL/min — ABNORMAL LOW
Glucose, Bld: 110 mg/dL — ABNORMAL HIGH (ref 70–99)
Glucose, Bld: 119 mg/dL — ABNORMAL HIGH (ref 70–99)
Glucose: 86 mg/dL (ref 70–99)
Potassium: 6.3 mmol/L (ref 3.5–5.2)
Potassium: 5.5 mmol/L — ABNORMAL HIGH (ref 3.5–5.1)
Potassium: 5.7 mmol/L — ABNORMAL HIGH (ref 3.5–5.1)
Sodium: 141 mmol/L (ref 134–144)
Sodium: 137 mmol/L (ref 135–145)
Sodium: 139 mmol/L (ref 135–145)
eGFR: 35 mL/min/1.73 — ABNORMAL LOW

## 2024-05-15 LAB — CREATININE, URINE, RANDOM: Creatinine, Urine: 131 mg/dL

## 2024-05-15 LAB — SODIUM, URINE, RANDOM: Sodium, Ur: 31 mmol/L

## 2024-05-15 LAB — TROPONIN T, HIGH SENSITIVITY
Troponin T High Sensitivity: 29 ng/L — ABNORMAL HIGH (ref 0–19)
Troponin T High Sensitivity: 29 ng/L — ABNORMAL HIGH (ref 0–19)

## 2024-05-15 LAB — D-DIMER, QUANTITATIVE: D-Dimer, Quant: 1.06 ug{FEU}/mL — ABNORMAL HIGH (ref 0.00–0.50)

## 2024-05-15 LAB — PRO BRAIN NATRIURETIC PEPTIDE: Pro Brain Natriuretic Peptide: 1023 pg/mL — ABNORMAL HIGH

## 2024-05-15 MED ORDER — SODIUM ZIRCONIUM CYCLOSILICATE 10 G PO PACK
10.0000 g | PACK | Freq: Once | ORAL | Status: AC
  Administered 2024-05-15: 10 g via ORAL
  Filled 2024-05-15: qty 1

## 2024-05-15 MED ORDER — SENNOSIDES-DOCUSATE SODIUM 8.6-50 MG PO TABS: 1.0000 | ORAL_TABLET | Freq: Every evening | ORAL | Status: DC | PRN

## 2024-05-15 MED ORDER — SODIUM ZIRCONIUM CYCLOSILICATE 10 G PO PACK: 10.0000 g | PACK | ORAL | Status: DC

## 2024-05-15 MED ORDER — ONDANSETRON HCL 4 MG PO TABS: 4.0000 mg | ORAL_TABLET | Freq: Four times a day (QID) | ORAL | Status: DC | PRN

## 2024-05-15 MED ORDER — ACETAMINOPHEN 500 MG PO TABS
1000.0000 mg | ORAL_TABLET | Freq: Once | ORAL | Status: AC
  Administered 2024-05-15: 1000 mg via ORAL
  Filled 2024-05-15: qty 2

## 2024-05-15 MED ORDER — ACETAMINOPHEN 325 MG PO TABS
650.0000 mg | ORAL_TABLET | Freq: Four times a day (QID) | ORAL | Status: DC | PRN
  Administered 2024-05-15 – 2024-05-17 (×5): 650 mg via ORAL
  Filled 2024-05-15 (×4): qty 2

## 2024-05-15 MED ORDER — AMLODIPINE BESYLATE 5 MG PO TABS
2.5000 mg | ORAL_TABLET | Freq: Every day | ORAL | Status: DC
  Administered 2024-05-17 – 2024-05-18 (×2): 2.5 mg via ORAL
  Filled 2024-05-15 (×2): qty 1

## 2024-05-15 MED ORDER — ONDANSETRON HCL 4 MG/2ML IJ SOLN: 4.0000 mg | Freq: Four times a day (QID) | INTRAMUSCULAR | Status: DC | PRN

## 2024-05-15 MED ORDER — TAMSULOSIN HCL 0.4 MG PO CAPS
0.4000 mg | ORAL_CAPSULE | Freq: Every day | ORAL | Status: DC
  Administered 2024-05-17 – 2024-05-18 (×2): 0.4 mg via ORAL
  Filled 2024-05-15 (×2): qty 1

## 2024-05-15 MED ORDER — ACETAMINOPHEN 650 MG RE SUPP: 650.0000 mg | Freq: Four times a day (QID) | RECTAL | Status: DC | PRN

## 2024-05-15 MED ORDER — LISINOPRIL 10 MG PO TABS: 10.0000 mg | ORAL_TABLET | Freq: Every day | ORAL | 0 refills | Status: AC

## 2024-05-15 MED ORDER — SODIUM ZIRCONIUM CYCLOSILICATE 5 G PO PACK
5.0000 g | PACK | ORAL | Status: AC
  Administered 2024-05-15: 5 g via ORAL
  Filled 2024-05-15: qty 1

## 2024-05-15 MED ORDER — SODIUM CHLORIDE 0.9 % IV BOLUS
500.0000 mL | Freq: Once | INTRAVENOUS | Status: AC
  Administered 2024-05-15: 500 mL via INTRAVENOUS

## 2024-05-15 MED ORDER — SIMVASTATIN 20 MG PO TABS
20.0000 mg | ORAL_TABLET | ORAL | Status: DC
  Administered 2024-05-16: 20 mg via ORAL
  Filled 2024-05-15: qty 1

## 2024-05-15 MED ORDER — SODIUM CHLORIDE 0.9% FLUSH
3.0000 mL | Freq: Two times a day (BID) | INTRAVENOUS | Status: DC
  Administered 2024-05-15 – 2024-05-18 (×5): 3 mL via INTRAVENOUS

## 2024-05-15 NOTE — ED Notes (Signed)
Lab aware of D Dimer order

## 2024-05-15 NOTE — Telephone Encounter (Signed)
 Patient is following up. He would like to know if the results that were read were from yesterday or this morning (drawn around 4:00 AM). Does he need to repeat labs a second time? Please advise.

## 2024-05-15 NOTE — ED Notes (Signed)
 Pt. Reports he feels a sharp pain when he take a deep breath in and only then.  Pt. Reports he was here earlier today.  Pt. Had blood work done.  Pt. Is back now with this chest pain.  Pt. Is scheduled for a heart cath tomorrow with Jolynn Pack.

## 2024-05-15 NOTE — Telephone Encounter (Signed)
 Received a critical result that patient's potassium is 6.3.  Attempted to contact the patient x 2 without response.  Left a voicemail.  Given significant elevation in potassium would recommend evaluation with an EKG.  Will route this to [facility], NP, who ordered the blood work and as well as send a message.

## 2024-05-15 NOTE — Hospital Course (Signed)
 Gary [name] is a 89 y.o. male with medical history significant for permanent atrial flutter on Eliquis , chronic large pericardial effusion, CKD stage IIIa, HTN, HLD, gout, BPH, rosacea, OSA on CPAP who is admitted for evaluation of large pericardial effusion with new pleuritic chest discomfort.

## 2024-05-15 NOTE — ED Provider Notes (Signed)
 " Gary [name]   CSN: [id number] Arrival date & time: 05/15/24  9647     Patient presents with: abnormal labs   Gary [name] is a 89 y.o. male.   The history is provided by the patient.  Illness Location:  At cardiologist office Quality:  Had labs and told potassium was very high and to come to the ED Severity:  Moderate Onset quality:  Sudden Duration:  1 day Timing:  Constant Progression:  Unchanged Chronicity:  New Context:  On Lisinopril  and has renal insufficiency Relieved by:  Nothing Worsened by:  Nothing Ineffective treatments:  None Associated symptoms: no fever and no wheezing   Patient with hyperlipidemia and renal insufficiency had pre procedure labs and told to come in for potassium 6.5.     Past Medical History:  Diagnosis Date   Adenomatous colon polyp 06/2000   ALLERGIC RHINITIS 10/03/2007   Allergy    seasonal   Arthritis    knees   Atrial flutter (HCC) 06/26/2014   Cancer (HCC)    skin cancers removed in the past basal cell and squamous cell   CARDIAC MURMUR, AORTIC 10/03/2007   Colon polyps    ERECTILE DYSFUNCTION 10/03/2007   High blood pressure 03/2016   HYPERLIPIDEMIA 10/03/2007   Rosacea    Sleep apnea      Prior to Admission medications  Medication Sig Start Date End Date Taking? Authorizing Provider  allopurinol  (ZYLOPRIM ) 100 MG tablet TAKE 2 TABLETS EVERY DAY 02/15/24   Burchette, Wolm ORN, MD  amLODipine  (NORVASC ) 2.5 MG tablet Take 1 tablet (2.5 mg total) by mouth daily. 02/26/24   Burchette, Wolm ORN, MD  apixaban  (ELIQUIS ) 5 MG TABS tablet Take 1 tablet (5 mg total) by mouth 2 (two) times daily. 02/26/24   Burchette, Wolm ORN, MD  co-enzyme Q-10 30 MG capsule Take 30 mg by mouth daily.    [provider]  fluticasone  (FLONASE ) 50 MCG/ACT nasal spray PLACE 1 SPRAY INTO BOTH NOSTRILS EVERY OTHER DAY. Patient taking differently: Place 1 spray into both nostrils daily  as needed for allergies or rhinitis. 04/18/23   Burchette, Wolm ORN, MD  minocycline  (MINOCIN ) 100 MG capsule Take 100 mg by mouth every other day. 07/25/23   [provider]  Multiple Vitamin (MULTIVITAMIN) tablet Take 1 tablet by mouth daily.    [provider]  simvastatin  (ZOCOR ) 20 MG tablet TAKE 1 TABLET EVERY OTHER DAY 04/09/24   Burchette, Wolm ORN, MD  tamsulosin  (FLOMAX ) 0.4 MG CAPS capsule TAKE 1 CAPSULE EVERY DAY 03/13/24   Burchette, Wolm ORN, MD    Allergies: Patient has no known allergies.    Review of Systems  Constitutional:  Negative for fever.  Respiratory:  Negative for wheezing and stridor.   All other systems reviewed and are negative.   Updated Vital Signs BP (!) 154/70 (BP Location: Right Arm)   Pulse 60   Temp 98.2 F (36.8 C) (Oral)   Resp 16   Ht 6' 3 (1.905 m)   Wt 111.1 kg   SpO2 98%   BMI 30.62 kg/m   Physical Exam Vitals and nursing [name] reviewed. Exam conducted with a chaperone present.  Constitutional:      General: He is not in acute distress.    Appearance: Normal appearance. He is well-developed. He is not diaphoretic.  HENT:     Head: Normocephalic and atraumatic.     Nose: Nose normal.  Eyes:  Conjunctiva/sclera: Conjunctivae normal.     Pupils: Pupils are equal, round, and reactive to light.  Cardiovascular:     Rate and Rhythm: Normal rate. Rhythm irregular.     Pulses: Normal pulses.     Heart sounds: Normal heart sounds.  Pulmonary:     Effort: Pulmonary effort is normal.     Breath sounds: Normal breath sounds. No wheezing or rales.  Abdominal:     General: Bowel sounds are normal.     Palpations: Abdomen is soft.     Tenderness: There is no abdominal tenderness. There is no guarding or rebound.  Musculoskeletal:        General: Normal range of motion.     Cervical back: Normal range of motion and neck supple.  Skin:    General: Skin is warm and dry.     Capillary Refill: Capillary refill takes less  than 2 seconds.  Neurological:     General: No focal deficit present.     Mental Status: He is alert and oriented to person, place, and time.     Deep Tendon Reflexes: Reflexes normal.  Psychiatric:        Mood and Affect: Mood normal.     (all labs ordered are listed, but only abnormal results are displayed) Results for orders placed or performed during the hospital encounter of 05/15/24  Basic metabolic panel   Collection Time: 05/15/24  4:22 AM  Result Value Ref Range   Sodium 137 135 - 145 mmol/L   Potassium 5.5 (H) 3.5 - 5.1 mmol/L   Chloride 106 98 - 111 mmol/L   CO2 21 (L) 22 - 32 mmol/L   Glucose, Bld 110 (H) 70 - 99 mg/dL   BUN 57 (H) 8 - 23 mg/dL   Creatinine, Ser 8.29 (H) 0.61 - 1.24 mg/dL   Calcium 8.6 (L) 8.9 - 10.3 mg/dL   GFR, Estimated 39 (L) >60 mL/min   Anion gap 10 5 - 15  Magnesium   Collection Time: 05/15/24  4:22 AM  Result Value Ref Range   Magnesium 2.0 1.7 - 2.4 mg/dL  CBC with Differential   Collection Time: 05/15/24  4:22 AM  Result Value Ref Range   WBC 8.3 4.0 - 10.5 K/uL   RBC 3.88 (L) 4.22 - 5.81 MIL/uL   Hemoglobin 12.5 (L) 13.0 - 17.0 g/dL   HCT 62.6 (L) 60.9 - 47.9 %   MCV 96.1 80.0 - 100.0 fL   MCH 32.2 26.0 - 34.0 pg   MCHC 33.5 30.0 - 36.0 g/dL   RDW 85.8 88.4 - 84.4 %   Platelets 129 (L) 150 - 400 K/uL   nRBC 0.0 0.0 - 0.2 %   Neutrophils Relative % 69 %   Neutro Abs 5.8 1.7 - 7.7 K/uL   Lymphocytes Relative 13 %   Lymphs Abs 1.1 0.7 - 4.0 K/uL   Monocytes Relative 14 %   Monocytes Absolute 1.2 (H) 0.1 - 1.0 K/uL   Eosinophils Relative 2 %   Eosinophils Absolute 0.2 0.0 - 0.5 K/uL   Basophils Relative 1 %   Basophils Absolute 0.0 0.0 - 0.1 K/uL   Immature Granulocytes 1 %   Abs Immature Granulocytes 0.04 0.00 - 0.07 K/uL   ECHOCARDIOGRAM LIMITED Result Date: 05/08/2024    ECHOCARDIOGRAM LIMITED REPORT   Patient Name:   Gary [name] Date of Exam: 05/08/2024 Medical Rec #:  [id number]       Height:       74.0  in Accession #:     [id number]      Weight:       256.0 lb Date of Birth:  January 15, 1936      BSA:          2.414 m Patient Age:    88 years        BP:           144/82 mmHg Patient Gender: M               HR:           63 bpm. Exam Location:  [address] Procedure: 2D Echo, Cardiac Doppler, Limited Echo and Limited Color Doppler            (Both Spectral and Color Flow Doppler were utilized during            procedure). Indications:    I31.39 Pericardial effusion  History:        Patient has prior history of Echocardiogram examinations, most                 recent 02/25/2024. Abnormal ECG, Pericardial effusion; Risk                 Factors:Hypertension and Dyslipidemia.  Sonographer:    Elsie Bohr RDCS Referring Phys: [id number] [place] [facility] [place] IMPRESSIONS  1. Left ventricular ejection fraction, by estimation, is 60 to 65%. The left ventricle has normal function. The left ventricle has no regional wall motion abnormalities. There is mild concentric left ventricular hypertrophy. Left ventricular diastolic parameters are indeterminate. There is the interventricular septum is flattened in systole and diastole, consistent with right ventricular pressure and volume overload.  2. Right ventricular systolic function is mildly reduced. The right ventricular size is moderately enlarged. There is severely elevated pulmonary artery systolic pressure. The estimated right ventricular systolic pressure is 62.9 mmHg.  3. Left atrial size was moderately dilated.  4. Right atrial size was severely dilated.  5. There is a very large primarily posterior pericardial effusion measuring up to 4.6cm with smaller focall components along the RA and RV. There is no evdicence of tamponade. Unchanged since 02/25/24. Large pericardial effusion. The pericardial effusion is posterior to the left ventricle. There is no evidence of cardiac tamponade.  6. The mitral valve is myxomatous. Mild mitral valve regurgitation.  7. Tricuspid valve regurgitation is  moderate.  8. The aortic valve is tricuspid. There is mild calcification of the aortic valve. Aortic valve regurgitation is mild. Aortic valve sclerosis/calcification is present, without any evidence of aortic stenosis.  9. The inferior vena cava is dilated in size with <50% respiratory variability, suggesting right atrial pressure of 15 mmHg. Conclusion(s)/Recommendation(s): There is a very large primarily posterior pericardial effusion measuring up to 4.6cm with smaller focall components along the RA and RV. There is no evdicence of tamponade. Unchanged since 02/25/24. FINDINGS  Left Ventricle: Left ventricular ejection fraction, by estimation, is 60 to 65%. The left ventricle has normal function. The left ventricle has no regional wall motion abnormalities. There is mild concentric left ventricular hypertrophy. The interventricular septum is flattened in systole and diastole, consistent with right ventricular pressure and volume overload. Left ventricular diastolic parameters are indeterminate. Right Ventricle: The right ventricular size is moderately enlarged. Right ventricular systolic function is mildly reduced. There is severely elevated pulmonary artery systolic pressure. The tricuspid regurgitant velocity is 3.46 m/s, and with an assumed right atrial pressure of 15 mmHg, the estimated right ventricular systolic pressure is  62.9 mmHg. Left Atrium: Left atrial size was moderately dilated. Right Atrium: Right atrial size was severely dilated. Pericardium: There is a very large primarily posterior pericardial effusion measuring up to 4.6cm with smaller focall components along the RA and RV. There is no evdicence of tamponade. Unchanged since 02/25/24. A large pericardial effusion is present. The pericardial effusion is posterior to the left ventricle. There is no evidence of cardiac tamponade. Mitral Valve: The mitral valve is myxomatous. Mild mitral valve regurgitation. Tricuspid Valve: Tricuspid valve  regurgitation is moderate. Aortic Valve: The aortic valve is tricuspid. There is mild calcification of the aortic valve. Aortic valve regurgitation is mild. Aortic regurgitation PHT measures 527 msec. Aortic valve sclerosis/calcification is present, without any evidence of aortic stenosis. Venous: The inferior vena cava is dilated in size with less than 50% respiratory variability, suggesting right atrial pressure of 15 mmHg. Additional Comments: There is pleural effusion in the left lateral region.  LEFT VENTRICLE PLAX 2D LVIDd:         5.05 cm LVIDs:         3.30 cm LV PW:         1.40 cm LV IVS:        1.10 cm  RIGHT VENTRICLE            IVC RVSP:           62.9 mmHg  IVC diam: 3.60 cm LEFT ATRIUM         Index       RIGHT ATRIUM LA diam:    5.60 cm 2.32 cm/m  RA Pressure: 15.00 mmHg  AORTIC VALVE LVOT Vmax:   88.76 cm/s LVOT Vmean:  60.180 cm/s LVOT VTI:    0.195 m AI PHT:      527 msec MITRAL VALVE               TRICUSPID VALVE MV Area (PHT): 4.77 cm    TR Peak grad:   47.9 mmHg MV Decel Time: 159 msec    TR Vmax:        346.00 cm/s MV E velocity: 91.82 cm/s  Estimated RAP:  15.00 mmHg MV A velocity: 62.34 cm/s  RVSP:           62.9 mmHg MV E/A ratio:  1.47                            SHUNTS                            Systemic VTI: 0.19 m Toribio Fuel MD Electronically signed by Toribio Fuel MD Signature Date/Time: 05/08/2024/8:16:06 PM    Final      EKG:  Date: 05/15/2024  Rate: 60  Rhythm: AFIB  QRS Axis: normal  Intervals: normal  ST/T Wave abnormalities: normal  Conduction Disutrbances: non-specific intraventricular conduction delay   Narrative Interpretation: AFIB, unchanged from previous    Radiology: No results found.   Procedures   Medications Ordered in the ED  sodium zirconium cyclosilicate  (LOKELMA ) packet 10 g (has no administration in time range)                                    Medical Decision Making Amount and/or Complexity of Data Reviewed Labs: ordered.     Details: Normal white count 8.3, hemoglobin normal  12.5, platelets slight low 129.  No  Discussion of management or test interpretation with external provider(s): 5:09 AM Case d/w Dr. Marty of cardiology.  EKG and lab results reviewed by phone. Recheck in a week.  Agrees with follow up at [facility]. COntinue lisinopril  no change.     Risk Prescription drug management. Risk Details: Well appearing.  No EKG changes previous specimen was clearly hemolyzed.  Will give one dose of lokelma  in the ED and refer back to PMD and cardiology for recheck of potassium in one week.  I will refer to [facility] given ongoing renal insifficiency.  Stable for discharge.  Strict returns     Final diagnoses:  Renal insufficiency  Hyperkalemia   No signs of systemic illness or infection. The patient is nontoxic-appearing on exam and vital signs are within normal limits.  I have reviewed the triage vital signs and the nursing notes. Pertinent labs & imaging results that were available during my care of the patient were reviewed by me and considered in my medical decision making (see chart for details). After history, exam, and medical workup I feel the patient has been appropriately medically screened and is safe for discharge home. Pertinent diagnoses were discussed with the patient. Patient was given return precautions.    ED Discharge Orders     None          Cleston Lautner, MD 05/15/24 9476  "

## 2024-05-15 NOTE — ED Provider Notes (Signed)
 " [facility] Provider Note   CSN: [id number] Arrival date & time: 05/15/24  1313     Patient presents with: Chest Pain   Gary [name] is a 89 y.o. male.   Pt is a 89 yo male with pmhx significant for afib (on eliquis ), hld, htn, skin cancer, and arthritis.  Pt has a hx of a large pericardial effusion (ECHO on 1/8 with no signs of tamponade) and is scheduled for a right heart cath and possible effusion drainage tomorrow.  He's been off the eliquis  in anticipation of that procedure.  He also had labs drawn yesterday with showed an elevated potassium.  Pt was told to come to the ED to get that treated, so he was here early this am for hyperkalemia.  He received 1 dose of lokelma  (10 mg) prior to d/c.  He got back home and developed cp and some sob.  He said this is different than what he's had in the past.       Prior to Admission medications  Medication Sig Start Date End Date Taking? Authorizing Provider  allopurinol  (ZYLOPRIM ) 100 MG tablet TAKE 2 TABLETS EVERY DAY 02/15/24   Burchette, Wolm ORN, MD  amLODipine  (NORVASC ) 2.5 MG tablet Take 1 tablet (2.5 mg total) by mouth daily. 02/26/24   Burchette, Wolm ORN, MD  apixaban  (ELIQUIS ) 5 MG TABS tablet Take 1 tablet (5 mg total) by mouth 2 (two) times daily. 02/26/24   Burchette, Wolm ORN, MD  co-enzyme Q-10 30 MG capsule Take 30 mg by mouth daily.    [provider]  fluticasone  (FLONASE ) 50 MCG/ACT nasal spray PLACE 1 SPRAY INTO BOTH NOSTRILS EVERY OTHER DAY. Patient taking differently: Place 1 spray into both nostrils daily as needed for allergies or rhinitis. 04/18/23   Burchette, Wolm ORN, MD  lisinopril  (ZESTRIL ) 10 MG tablet Take 1 tablet (10 mg total) by mouth daily. 05/15/24   Palumbo, April, MD  minocycline  (MINOCIN ) 100 MG capsule Take 100 mg by mouth every other day. 07/25/23   [provider]  Multiple Vitamin (MULTIVITAMIN) tablet Take 1 tablet by mouth daily.     [provider]  simvastatin  (ZOCOR ) 20 MG tablet TAKE 1 TABLET EVERY OTHER DAY 04/09/24   Burchette, Wolm ORN, MD  tamsulosin  (FLOMAX ) 0.4 MG CAPS capsule TAKE 1 CAPSULE EVERY DAY 03/13/24   Burchette, Wolm ORN, MD    Allergies: Patient has no known allergies.    Review of Systems  Respiratory:  Positive for shortness of breath.   Cardiovascular:  Positive for chest pain.  All other systems reviewed and are negative.   Updated Vital Signs BP 136/80 (BP Location: Right Arm)   Pulse 69   Temp 97.6 F (36.4 C) (Oral)   Resp 20   Ht 6' 3 (1.905 m)   Wt 68 kg   SpO2 96%   BMI 18.75 kg/m   Physical Exam Vitals and nursing note reviewed.  Constitutional:      Appearance: He is well-developed.  HENT:     Head: Normocephalic and atraumatic.  Eyes:     Extraocular Movements: Extraocular movements intact.     Pupils: Pupils are equal, round, and reactive to light.  Cardiovascular:     Rate and Rhythm: Normal rate. Rhythm irregular.     Heart sounds: Normal heart sounds.  Pulmonary:     Effort: Pulmonary effort is normal.     Breath sounds: Normal breath sounds.  Abdominal:  General: Bowel sounds are normal.     Palpations: Abdomen is soft.  Musculoskeletal:        General: Normal range of motion.     Cervical back: Normal range of motion and neck supple.     Right lower leg: Edema present.     Left lower leg: Edema present.     Comments: LLE sl more swollen than right  Skin:    General: Skin is warm.     Capillary Refill: Capillary refill takes less than 2 seconds.  Neurological:     General: No focal deficit present.     Mental Status: He is alert and oriented to person, place, and time.  Psychiatric:        Mood and Affect: Mood normal.        Behavior: Behavior normal.     (all labs ordered are listed, but only abnormal results are displayed) Labs Reviewed  BASIC METABOLIC PANEL WITH GFR - Abnormal; Notable for the following components:      Result  Value   Potassium 5.7 (*)    CO2 20 (*)    Glucose, Bld 119 (*)    BUN 58 (*)    Creatinine, Ser 1.82 (*)    Calcium 8.7 (*)    GFR, Estimated 36 (*)    All other components within normal limits  CBC - Abnormal; Notable for the following components:   RBC 3.89 (*)    Hemoglobin 12.4 (*)    HCT 38.2 (*)    Platelets 141 (*)    All other components within normal limits  D-DIMER, QUANTITATIVE - Abnormal; Notable for the following components:   D-Dimer, Quant 1.06 (*)    All other components within normal limits  TROPONIN T, HIGH SENSITIVITY - Abnormal; Notable for the following components:   Troponin T High Sensitivity 29 (*)    All other components within normal limits  TROPONIN T, HIGH SENSITIVITY    EKG: EKG Interpretation Date/Time:  Thursday May 15 2024 13:27:20 EST Ventricular Rate:  66 PR Interval:    QRS Duration:  135 QT Interval:  516 QTC Calculation: 529 R Axis:   55  Text Interpretation: Atrial fibrillation Paired ventricular premature complexes Left bundle branch block No significant change since last tracing Confirmed by Dean Clarity [phone]) on 05/15/2024 2:01:50 PM  Radiology: DG Chest 2 View Result Date: 05/15/2024 CLINICAL DATA:  Chest pain and shortness of breath. EXAM: CHEST - 2 VIEW COMPARISON:  09/07/2022 and chest CT 08/21/2023 FINDINGS: Lungs are adequately inflated demonstrate moderate size left pleural effusion likely with associated left basilar atelectasis worse compared to the prior exam. Right lung is clear. Slightly more prominent cardiac silhouette which could represent recurrent pericardial effusion as seen on previous chest CT. Remainder the exam is unchanged. IMPRESSION: 1. Moderate size left pleural effusion likely with associated left basilar atelectasis worse compared to the prior exam. 2. Possible interval increased size of the cardiac silhouette which may represent recurrent pericardial effusion as seen on prior CT. Electronically Signed    By: Toribio Agreste M.D.   On: 05/15/2024 15:07     Procedures   Medications Ordered in the ED  sodium chloride  0.9 % bolus 500 mL (has no administration in time range)  sodium zirconium cyclosilicate  (LOKELMA ) packet 10 g (has no administration in time range)  sodium zirconium cyclosilicate  (LOKELMA ) packet 10 g (10 g Oral Given 05/15/24 1507)  Medical Decision Making Amount and/or Complexity of Data Reviewed Labs: ordered. Radiology: ordered.  Risk Prescription drug management. Decision regarding hospitalization.   This patient presents to the ED for concern of cp, this involves an extensive number of treatment options, and is a complaint that carries with it a high risk of complications and morbidity.  The differential diagnosis includes cad, pericardial effusion, pulmonary embolism, pna, gi   Co morbidities that complicate the patient evaluation  afib (on eliquis ), hld, htn, skin cancer, and arthritis   Additional history obtained:  Additional history obtained from epic chart review    Lab Tests:  I Ordered, and personally interpreted labs.  The pertinent results include:  cbc with hgb 12.4; bmp with k elevated at 5.7, bun elevated at 58 and cr elevated at 1.82 (k was 5.5 early this am-> pt did receive lokelma ), kidney fct is slowly worsening (Cr 1.44 in May, 1.67 in October and 1.82 today)   Imaging Studies ordered:  I ordered imaging studies including cxr and US  I independently visualized and interpreted imaging which showed CXR: Moderate size left pleural effusion likely with associated left  basilar atelectasis worse compared to the prior exam.  2. Possible interval increased size of the cardiac silhouette which  may represent recurrent pericardial effusion as seen on prior CT.   I agree with the radiologist interpretation   Cardiac Monitoring:  The patient was maintained on a cardiac monitor.  I personally viewed and  interpreted the cardiac monitored which showed an underlying rhythm of: afib   Medicines ordered and prescription drug management:  I ordered medication including ivfs  for aki and lokelma  for hyperkalemia  Reevaluation of the patient after these medicines showed that the patient improved I have reviewed the patients home medicines and have made adjustments as needed   Test Considered:  Ct/US    Critical Interventions:  lokelma    Consultations Obtained:  I requested consultation with the cardiologist (Dr. Delford),  and discussed lab and imaging findings as well as pertinent plan - he recommends admission to medicine at [facility].  His service will consult. Pt d/w Dr. Verdene who will admit.  He asked me to put in a dose of lokelma  4 hrs from now and this is done.   Problem List / ED Course:  CP:  atypical.  Pt has been off Eliquis .  DDimer slightly elevated.  As left leg is slightly more swollen than right, US  ordered.  Troponins will need trending Hyperkalemia:  k elevating despite lokelma .  No EKG changes, so I will add ivfs and lokelma . Afib (normally on Eliquis , but on hold for procedure scheduled for tomorrow) CHA2DS2/VAS Stroke Risk Points  Current as of 5 hours ago     4 >= 2 Points: High Risk  1 to 1.99 Points: Medium Risk  0 Points: Low Risk    Last Change: N/A      Details    This score determines the patient's risk of having a stroke if the  patient has atrial fibrillation.       Points Metrics  0 Has Congestive Heart Failure:  No    Current as of 5 hours ago  0 Has Vascular Disease:  No    Current as of 5 hours ago  1 Has Hypertension:  Yes    Current as of 5 hours ago  2 Age:  50    Current as of 5 hours ago  1 Has Diabetes Excluding Gestational Diabetes:  Yes  Current as of 5 hours ago  0 Had Stroke:  No  Had TIA:  No  Had Thromboembolism:  No    Current as of 5 hours ago  0 Male:  No    Current as of 5 hours ago              Reevaluation:  After the interventions noted above, I reevaluated the patient and found that they have :improved   Social Determinants of Health:  Lives at home   Dispostion:  After consideration of the diagnostic results and the patients response to treatment, I feel that the patent would benefit from admission.    CRITICAL CARE Performed by: Mliss Boyers   Total critical care time: 30 minutes  Critical care time was exclusive of separately billable procedures and treating other patients.  Critical care was necessary to treat or prevent imminent or life-threatening deterioration.  Critical care was time spent personally by me on the following activities: development of treatment plan with patient and/or surrogate as well as nursing, discussions with consultants, evaluation of patient's response to treatment, examination of patient, obtaining history from patient or surrogate, ordering and performing treatments and interventions, ordering and review of laboratory studies, ordering and review of radiographic studies, pulse oximetry and re-evaluation of patient's condition.      Final diagnoses:  Pericardial effusion  Hyperkalemia  AKI (acute kidney injury)    ED Discharge Orders     None          Boyers Mliss, MD 05/15/24 1526  "

## 2024-05-15 NOTE — ED Notes (Signed)
 Pt. Has noted edema in the L lower extremity.

## 2024-05-15 NOTE — H&P (Signed)
 " History and Physical    Gary [name] FMW:[id number] DOB: 1935-06-21 DOA: 05/15/2024  PCP: Micheal Wolm ORN, MD  Patient coming from: Home  I have personally briefly reviewed patient's old medical records in [facility]  Chief Complaint: Pleuritic chest pain  HPI: Gary [name] is a 89 y.o. male with medical history significant for permanent atrial flutter on Eliquis , chronic large pericardial effusion, CKD stage IIIa, HTN, HLD, gout, BPH, rosacea, OSA on CPAP who presented to the ED for evaluation of abnormal labs and shortness of breath.  Patient has been following with cardiology for atrial flutter and chronic large pericardial effusion.  TTE 05/08/2024 showed EF 60-65%, IV septum flattening consistent with RV pressure and volume overload, severely elevated PA pressure, very large posterior pericardial effusion measuring up to 4.6 cm without evidence of tamponade.  Patient was recommended to undergo right heart catheterization and possible drainage of large pericardial effusion.  This was scheduled for 05/16/2024.  Patient had labs obtained yesterday (05/14/2024) which showed potassium 6.3 and worsening creatinine of 1.86 (previously 1.67 in October 2025 with prior baseline 1.3-1.4).  Patient was advised to go to the ED for further evaluation.  Repeat labs earlier this morning in the ED showed potassium improved to 5.5 and creatinine 1.70.  It was felt earlier labs were hemolyzed.  Patient was given oral Lokelma  5 g.  EDP discussed the case with on-call cardiology who recommended follow-up with [facility] as an outpatient with repeat labs.  Patient states that he returned home and went to sleep.  He woke up with midsternal chest pain.  He says pain only occurs when he takes deep breaths.  Pain is nonradiating.  He denies any similar chest discomfort in the past.  He reports some exertional dyspnea such as when walking up a flight of stairs.  He denies orthopnea.  He has seen  some minimal swelling in his legs.  He reports occasional nonproductive cough.  He reports good urine output without dysuria.  He denies any recent nausea, vomiting, diarrhea.  Patient states he last took his Eliquis  on 05/13/2024 in preparation for his cardiac procedure.  Has not had any obvious bleeding.  [facility] ED Course  Labs/Imaging on admission: I have personally reviewed following labs and imaging studies.  Initial vitals showed BP 136/80, pulse 69, RR 20, temp 97.6 F, SpO2 96% on room air.  Labs showed sodium 139, potassium 5.7, bicarb 20, BUN 58, creatinine 1.82, serum glucose 119, WBC 10.5, hemoglobin 12.4, platelets 141, troponin T 29 x 2, D-dimer 1.06.  2 view chest x-ray showed moderate-sized left pleural effusion likely with associated left basilar atelectasis, worse compared to prior exam.  Possible interval increase size of the cardiac silhouette.  Bilateral lower extremity venous ultrasound were negative for evidence of DVT.  Patient was given 500 ccs normal saline, oral Lokelma  10 g.  EDP discussed with cardiology (Dr. Delford) who recommended medical admission to [facility] and their team will see in consultation.  The hospitalist service was consulted for admission.  Review of Systems: All systems reviewed and are negative except as documented in history of present illness above.   Past Medical History:  Diagnosis Date   Adenomatous colon polyp 06/2000   ALLERGIC RHINITIS 10/03/2007   Allergy    seasonal   Arthritis    knees   Atrial flutter (HCC) 06/26/2014   Cancer (HCC)    skin cancers removed in the past basal cell and squamous cell  CARDIAC MURMUR, AORTIC 10/03/2007   Colon polyps    ERECTILE DYSFUNCTION 10/03/2007   High blood pressure 03/2016   HYPERLIPIDEMIA 10/03/2007   Rosacea    Sleep apnea     Past Surgical History:  Procedure Laterality Date   ADENOIDECTOMY     CARDIOVERSION N/A 07/01/2014   Procedure: CARDIOVERSION;  Surgeon:  Jerel Balding, MD;  Location: [facility] ENDOSCOPY;  Service: Cardiovascular;  Laterality: N/A;   COLONOSCOPY     PALATE SURGERY     RIGHT HEART CATH N/A 09/11/2022   Procedure: RIGHT HEART CATH;  Surgeon: Court Dorn PARAS, MD;  Location: [facility] INVASIVE CV LAB;  Service: Cardiovascular;  Laterality: N/A;   TEE WITHOUT CARDIOVERSION N/A 07/01/2014   Procedure: TRANSESOPHAGEAL ECHOCARDIOGRAM (TEE);  Surgeon: Jerel Balding, MD;  Location: [facility] ENDOSCOPY;  Service: Cardiovascular;  Laterality: N/A;   TONSILLECTOMY     TOTAL KNEE ARTHROPLASTY Right 08/27/2017   Procedure: RIGHT TOTAL KNEE ARTHROPLASTY;  Surgeon: Rubie Kemps, MD;  Location: [facility] OR;  Service: Orthopedics;  Laterality: Right;    Social History: Social History[1]  Allergies[2]  Family History  Problem Relation Age of Onset   Diabetes Mother    Cancer Father    Liver cancer Father    Hypotension Sister    CAD Brother    Stomach cancer Paternal Aunt      Prior to Admission medications  Medication Sig Start Date End Date Taking? Authorizing Provider  allopurinol  (ZYLOPRIM ) 100 MG tablet TAKE 2 TABLETS EVERY DAY 02/15/24   Burchette, Wolm ORN, MD  amLODipine  (NORVASC ) 2.5 MG tablet Take 1 tablet (2.5 mg total) by mouth daily. 02/26/24   Burchette, Wolm ORN, MD  apixaban  (ELIQUIS ) 5 MG TABS tablet Take 1 tablet (5 mg total) by mouth 2 (two) times daily. 02/26/24   Burchette, Wolm ORN, MD  co-enzyme Q-10 30 MG capsule Take 30 mg by mouth daily.    [provider]  fluticasone  (FLONASE ) 50 MCG/ACT nasal spray PLACE 1 SPRAY INTO BOTH NOSTRILS EVERY OTHER DAY. Patient taking differently: Place 1 spray into both nostrils daily as needed for allergies or rhinitis. 04/18/23   Burchette, Wolm ORN, MD  lisinopril  (ZESTRIL ) 10 MG tablet Take 1 tablet (10 mg total) by mouth daily. 05/15/24   Palumbo, April, MD  minocycline  (MINOCIN ) 100 MG capsule Take 100 mg by mouth every other day. 07/25/23   [provider]  Multiple Vitamin  (MULTIVITAMIN) tablet Take 1 tablet by mouth daily.    [provider]  simvastatin  (ZOCOR ) 20 MG tablet TAKE 1 TABLET EVERY OTHER DAY 04/09/24   Burchette, Wolm ORN, MD  tamsulosin  (FLOMAX ) 0.4 MG CAPS capsule TAKE 1 CAPSULE EVERY DAY 03/13/24   Micheal Wolm ORN, MD    Physical Exam: Vitals:   05/15/24 1415 05/15/24 1654 05/15/24 1800 05/15/24 1936  BP: 134/72  135/74 119/71  Pulse: 74 73 70 75  Resp: 19 (!) 27 20 20   Temp:   98.8 F (37.1 C) 98.8 F (37.1 C)  TempSrc:   Oral Oral  SpO2: 91% 95% 98% 98%  Weight:      Height:       Constitutional: Resting in bed with head elevated, NAD, calm, comfortable Eyes: EOMI, lids and conjunctivae normal ENMT: Mucous membranes are moist. Posterior pharynx clear of any exudate or lesions.Normal dentition.  Neck: normal, supple, no masses. Respiratory: clear to auscultation bilaterally, no wheezing, no crackles. Normal respiratory effort. No accessory muscle use.  Cardiovascular: Irregular, no murmurs / rubs / gallops.  Trace lower extremity edema. 2+ pedal pulses. Abdomen: no tenderness, no masses palpated. Musculoskeletal: no clubbing / cyanosis. No joint deformity upper and lower extremities. Good ROM, no contractures. Normal muscle tone.  Skin: no rashes, lesions, ulcers. No induration Neurologic: Sensation intact. Strength 5/5 in all 4.  Psychiatric: Normal judgment and insight. Alert and oriented x 3. Normal mood.   EKG: Personally reviewed. Atrial flutter, rate 66, LBBB.  Assessment/Plan Principal Problem:   Pericardial effusion Active Problems:   Acute kidney injury superimposed on chronic kidney disease   Hyperkalemia   Typical atrial flutter (HCC)   Hypercholesterolemia   BPH associated with nocturia   Obstructive sleep apnea   Essential hypertension   Gout   Thrombocytopenia   Gary [name] is a 89 y.o. male with medical history significant for permanent atrial flutter on Eliquis , chronic large pericardial  effusion, CKD stage IIIa, HTN, HLD, gout, BPH, rosacea, OSA on CPAP who is admitted for evaluation of large pericardial effusion with new pleuritic chest discomfort.  Assessment and Plan: Chronic large pericardial effusion: Patient with chronic large pericardial effusion of unclear etiology, previous autoimmune workup has been negative.  Recent TTE 05/08/2024 showed evidence of RV pressure and volume overload, severely elevated PA systolic pressure, no evidence of tamponade.  He has been experiencing pleuritic chest discomfort.  Troponin T is minimally elevated, not consistent with ACS. - Cardiology following - Planning for RHC and possible pericardiocentesis tomorrow - Keep n.p.o. after midnight - Eliquis  on hold (per patient last dose was on 05/13/2024)  Acute kidney injury superimposed on CKD stage IIIa: Creatinine 1.82.  Last creatinine was 1.67 on 02/26/2024 with previous baseline 1.3-1.4.  Potentially cardiorenal process. - Hold lisinopril , avoid NSAIDs - Obtain urine studies - Monitor UOP, bladder scan and in-N-Out cath as needed - Consider trial of diuresis  Hyperkalemia: Potassium 5.7.  No significant EKG changes.  Received Lokelma  10 g in the ED and receiving second dose now.  Keep on telemetry and repeat labs in AM.  Permanent atrial flutter: Remains in atrial flutter with variable block, rate is controlled.  Appears that he is not on beta-blocker due to history of slow ventricular response.  Eliquis  on hold as above.  Hypertension: Continue amlodipine .  Holding lisinopril .  Hyperlipidemia: Continue simvastatin .  Thrombocytopenia: Mild without obvious bleeding.  Continue to monitor.  BPH: Continue Flomax .  Gout: Holding allopurinol  given renal dysfunction.  OSA: Continue CPAP nightly.   DVT prophylaxis: SCDs Start: 05/15/24 2011 Code Status: Full code Family Communication: Discussed with patient, he has discussed with family Disposition Plan: From home, dispo  pending clinical progress Consults called: Cardiology Severity of Illness: The appropriate patient status for this patient is INPATIENT. Inpatient status is judged to be reasonable and necessary in order to provide the required intensity of service to ensure the patient's safety. The patient's presenting symptoms, physical exam findings, and initial radiographic and laboratory data in the context of their chronic comorbidities is felt to place them at high risk for further clinical deterioration. Furthermore, it is not anticipated that the patient will be medically stable for discharge from the hospital within 2 midnights of admission.   * I certify that at the point of admission it is my clinical judgment that the patient will require inpatient hospital care spanning beyond 2 midnights from the point of admission due to high intensity of service, high risk for further deterioration and high frequency of surveillance required.Gary Jorie Blanch MD [facility]  If 7PM-7AM, please contact  night-coverage [email or web address]  05/15/2024, 8:47 PM      [1]  Social History Tobacco Use   Smoking status: Former   Smokeless tobacco: Never  Vaping Use   Vaping status: Never Used  Substance Use Topics   Alcohol use: Yes    Alcohol/week: 8.0 standard drinks of alcohol    Types: 8 Glasses of wine per week    Comment: ocassionally    Drug use: No  [2] No Known Allergies  "

## 2024-05-15 NOTE — ED Notes (Signed)
 Called [facility] for transport to [facility] @16 :19.  Spoke with Joselyn

## 2024-05-15 NOTE — ED Notes (Signed)
 Pt was called at 230 in the morning by his PCP and to note he needed to return call due to some abnormal findings in his labs.  K+ was found elevated. Pt was medicated for same at this facility.

## 2024-05-15 NOTE — ED Triage Notes (Signed)
 Pt states that he woke up with chest pain. States that the pain has been steady since. States that he is also having some shortness of breath. States that he is set to have a cardiac cath tomorrow.

## 2024-05-15 NOTE — Consult Note (Signed)
 "  Cardiology Consultation   Patient ID: TIEGAN TERPSTRA MRN: [id number]; DOB: 09-01-1935  Admit date: 05/15/2024 Date of Consult: 05/15/2024  PCP:  Micheal Wolm ORN, MD   [facility] Providers Cardiologist:  Jerel Balding, MD     Patient Profile: AMI [name] is a 89 y.o. male with a hx of pericardial effusion, permanent atrial flutter, hypertension, OSA on CPAP who is being seen 05/15/2024 for the evaluation of pericardial effusion at the request of Dr. Tobie.  History of Present Illness: [name] is an 89 year old male with past medical history noted above.  He is followed by Dr. Balding as an outpatient.  He has a history of a large pericardial effusion initially diagnosed in May 2024 with unclear etiology but no evidence of tamponade and negative autoimmune markers.  Effusion had remained largely unchanged during the past year of follow-up.  Attempted therapy with colchicine  which was subsequently stopped.  After a syncopal event and hospitalization he did wear a 14-day monitor which showed persistent atrial fibrillation with the average ventricular rate of 55 bpm.  Seen in the clinic 12/2023 with Dr. Balding with no compelling reason to proceed with pericardiocentesis or window at that time.  Underwent repeat limited echo 01/2024 showing a large pericardial effusion posterior to the left ventricle localized near the right atrium but no evidence of tamponade with LVEF of 60 to 65%, mild LVH, mildly reduced and enlarged RV, moderately elevated PASP 49 mmHg, moderately dilated left atria, severely dilated right atria, mild MR and mild to moderate tricuspid valve regurgitation.   Seen in the office on 1/6 with complaints of worsening shortness of breath and lightheadedness.  Given his symptoms was recommended for repeat echocardiogram done on 1/8 with LVEF of 60 to 65%, interventricular septum flattening consistent with RV pressure and volume overload, mildly reduced RV, PASP  of 62 mmHg, biatrial dilation, very large primarily post anterior pericardial effusion measuring up to 4.6 cm with smaller focal components along the RA and RV with no evidence of tamponade, mild MR.  Was set up for outpatient right heart cath and potential pericardiocentesis scheduled for 1/16.  Preprocedure labs were drawn showing hyperkalemia with a potassium of 6.3.  He was called by the overnight cardiology coverage fellow and directed to the ED for further evaluation.  He was given a dose of Lokelma  in the ED and discharged with recommended follow-up with [facility].  Back to the ED shortly there afterwards with complaints of chest pain. Says chest pain is present only with breathing, no exertional. Had noted increased shortness of breath over the past couple of weeks, having to stop and rest more often.   Labs on admission showed Na + 137, K+ 5.5, Cr 1.70, hsTn 29>>29, WBC 8.3, Hgb 12.5, Ddimer 1.06. CXR moderate size left pleural effusion with associated atelectasis. EKG atrial fibrillation 60bpm. He was transferred to [facility] and admitted to IM for further management with cardiology asked to evaluate.    Past Medical History:  Diagnosis Date   Adenomatous colon polyp 06/2000   ALLERGIC RHINITIS 10/03/2007   Allergy    seasonal   Arthritis    knees   Atrial flutter (HCC) 06/26/2014   Cancer (HCC)    skin cancers removed in the past basal cell and squamous cell   CARDIAC MURMUR, AORTIC 10/03/2007   Colon polyps    ERECTILE DYSFUNCTION 10/03/2007   High blood pressure 03/2016   HYPERLIPIDEMIA 10/03/2007   Rosacea    Sleep  apnea     Past Surgical History:  Procedure Laterality Date   ADENOIDECTOMY     CARDIOVERSION N/A 07/01/2014   Procedure: CARDIOVERSION;  Surgeon: Jerel Balding, MD;  Location: [facility] ENDOSCOPY;  Service: Cardiovascular;  Laterality: N/A;   COLONOSCOPY     PALATE SURGERY     RIGHT HEART CATH N/A 09/11/2022   Procedure: RIGHT HEART CATH;  Surgeon: Court Dorn PARAS, MD;  Location: [facility] INVASIVE CV LAB;  Service: Cardiovascular;  Laterality: N/A;   TEE WITHOUT CARDIOVERSION N/A 07/01/2014   Procedure: TRANSESOPHAGEAL ECHOCARDIOGRAM (TEE);  Surgeon: Jerel Balding, MD;  Location: [facility] ENDOSCOPY;  Service: Cardiovascular;  Laterality: N/A;   TONSILLECTOMY     TOTAL KNEE ARTHROPLASTY Right 08/27/2017   Procedure: RIGHT TOTAL KNEE ARTHROPLASTY;  Surgeon: Rubie Kemps, MD;  Location: [facility] OR;  Service: Orthopedics;  Laterality: Right;     Scheduled Meds:  sodium zirconium cyclosilicate   10 g Oral Once   Continuous Infusions:  PRN Meds:   Allergies:   Allergies[1]  Social History:   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    Comment: retired  Tobacco Use   Smoking status: Former   Smokeless tobacco: Never  Vaping Use   Vaping status: Never Used  Substance and Sexual Activity   Alcohol use: Yes    Alcohol/week: 8.0 standard drinks of alcohol    Types: 8 Glasses of wine per week    Comment: ocassionally    Drug use: No   Sexual activity: Not Currently  Other Topics Concern   Not on file  Social History Narrative   Lives alone;wife deceased in the last year;two story house, but occupies main level mostly   Has 4 children, two biological, two stepchildren, 9 grandkids   One son local, supportive    Active playing golf with friends, goes to systems analyst 2 days/week at [employer]   Enjoys reading, soduku   Social Drivers of Health   Tobacco Use: Medium Risk (05/15/2024)   Patient History    Smoking Tobacco Use: Former    Smokeless Tobacco Use: Never    Passive Exposure: Not on Actuary Strain: Low Risk (05/30/2023)   Overall Financial Resource Strain (CARDIA)    Difficulty of Paying Living Expenses: Not  hard at all  Food Insecurity: No Food Insecurity (05/30/2023)   Hunger Vital Sign    Worried About Running Out of Food in the Last Year: Never true    Ran Out of Food in the Last Year: Never true  Transportation Needs: No Transportation Needs (05/30/2023)   PRAPARE - Administrator, Civil Service (Medical): No    Lack of Transportation (Non-Medical): No  Physical Activity: Insufficiently Active (05/30/2023)   Exercise Vital Sign    Days of Exercise per Week: 2 days    Minutes of Exercise per Session: 30 min  Stress: No Stress Concern Present (05/30/2023)   [employer] of Occupational Health - Occupational Stress Questionnaire    Feeling of Stress : Not at all  Social Connections: Socially Integrated (05/20/2021)   Social Connection and Isolation Panel    Frequency of Communication with Friends and Family: More than three times a week    Frequency of Social Gatherings  with Friends and Family: More than three times a week    Attends Religious Services: More than 4 times per year    Active Member of Clubs or Organizations: Yes    Attends Banker Meetings: More than 4 times per year    Marital Status: Married  Catering Manager Violence: Not At Risk (05/30/2023)   Humiliation, Afraid, Rape, and Kick questionnaire    Fear of Current or Ex-Partner: No    Emotionally Abused: No    Physically Abused: No    Sexually Abused: No  Depression (PHQ2-9): Low Risk (05/30/2023)   Depression (PHQ2-9)    PHQ-2 Score: 0  Alcohol Screen: Low Risk (05/30/2023)   Alcohol Screen    Last Alcohol Screening Score (AUDIT): 7  Housing: Unknown (01/15/2024)   Received from [facility]   Epic    Unable to Pay for Housing in the Last Year: Not on file    Number of Times Moved in the Last Year: Not on file    At any time in the past 12 months, were you homeless or living in a shelter (including now)?: No  Utilities: Not At Risk (05/30/2023)   AHC Utilities     Threatened with loss of utilities: No  Health Literacy: Adequate Health Literacy (05/30/2023)   B1300 Health Literacy    Frequency of need for help with medical instructions: Never    Family History:    Family History  Problem Relation Age of Onset   Diabetes Mother    Cancer Father    Liver cancer Father    Hypotension Sister    CAD Brother    Stomach cancer Paternal Aunt      ROS:  Please see the history of present illness.   All other ROS reviewed and negative.     Physical Exam/Data: Vitals:   05/15/24 1321 05/15/24 1415 05/15/24 1654 05/15/24 1800  BP: 136/80 134/72  135/74  Pulse: 69 74 73 70  Resp: 20 19 (!) 27 20  Temp: 97.6 F (36.4 C)   98.8 F (37.1 C)  TempSrc: Oral   Oral  SpO2: 96% 91% 95% 98%  Weight:      Height:       No intake or output data in the 24 hours ending 05/15/24 1935    05/15/2024    1:19 PM 05/15/2024    4:14 AM 05/06/2024    3:22 PM  Last 3 Weights  Weight (lbs) 150 lb 245 lb 256 lb  Weight (kg) 68.04 kg 111.131 kg 116.121 kg     Body mass index is 18.75 kg/m.  General:  Well nourished, well developed, in no acute distress HEENT: normal Neck: no JVD Vascular: Distal pulses 2+ bilaterally Cardiac:  normal S1, S2; Irreg Irreg; s3?  Lungs:  Diminished on the left side  Abd: soft, nontender, no hepatomegaly  Ext: no edema Musculoskeletal:  No deformities, BUE and BLE strength normal and equal Skin: warm and dry  Neuro: no focal abnormalities noted Psych:  Normal affect   EKG:  The EKG was personally reviewed and demonstrates:  atrial flutter, 60bpm Telemetry:  Telemetry was personally reviewed and demonstrates:  rate controlled atrial flutter   Relevant CV Studies:  Echo: 05/08/2024  IMPRESSIONS     1. Left ventricular ejection fraction, by estimation, is 60 to 65%. The  left ventricle has normal function. The left ventricle has no regional  wall motion abnormalities. There is mild concentric left ventricular  hypertrophy.  Left ventricular diastolic  parameters are indeterminate. There is the interventricular septum is  flattened in systole and diastole, consistent with right ventricular  pressure and volume overload.   2. Right ventricular systolic function is mildly reduced. The right  ventricular size is moderately enlarged. There is severely elevated  pulmonary artery systolic pressure. The estimated right ventricular  systolic pressure is 62.9 mmHg.   3. Left atrial size was moderately dilated.   4. Right atrial size was severely dilated.   5. There is a very large primarily posterior pericardial effusion  measuring up to 4.6cm with smaller focall components along the RA and RV.  There is no evdicence of tamponade. Unchanged since 02/25/24. Large  pericardial effusion. The pericardial  effusion is posterior to the left ventricle. There is no evidence of  cardiac tamponade.   6. The mitral valve is myxomatous. Mild mitral valve regurgitation.   7. Tricuspid valve regurgitation is moderate.   8. The aortic valve is tricuspid. There is mild calcification of the  aortic valve. Aortic valve regurgitation is mild. Aortic valve  sclerosis/calcification is present, without any evidence of aortic  stenosis.   9. The inferior vena cava is dilated in size with <50% respiratory  variability, suggesting right atrial pressure of 15 mmHg.   Conclusion(s)/Recommendation(s): There is a very large primarily posterior  pericardial effusion measuring up to 4.6cm with smaller focall components  along the RA and RV. There is no evdicence of tamponade. Unchanged since  02/25/24.   FINDINGS   Left Ventricle: Left ventricular ejection fraction, by estimation, is 60  to 65%. The left ventricle has normal function. The left ventricle has no  regional wall motion abnormalities. There is mild concentric left  ventricular hypertrophy. The  interventricular septum is flattened in systole and diastole, consistent  with right  ventricular pressure and volume overload. Left ventricular  diastolic parameters are indeterminate.   Right Ventricle: The right ventricular size is moderately enlarged. Right  ventricular systolic function is mildly reduced. There is severely  elevated pulmonary artery systolic pressure. The tricuspid regurgitant  velocity is 3.46 m/s, and with an assumed  right atrial pressure of 15 mmHg, the estimated right ventricular systolic  pressure is 62.9 mmHg.   Left Atrium: Left atrial size was moderately dilated.   Right Atrium: Right atrial size was severely dilated.   Pericardium: There is a very large primarily posterior pericardial  effusion measuring up to 4.6cm with smaller focall components along the RA  and RV. There is no evdicence of tamponade. Unchanged since 02/25/24. A  large pericardial effusion is present.  The pericardial effusion is posterior to the left ventricle. There is no  evidence of cardiac tamponade.   Mitral Valve: The mitral valve is myxomatous. Mild mitral valve  regurgitation.   Tricuspid Valve: Tricuspid valve regurgitation is moderate.   Aortic Valve: The aortic valve is tricuspid. There is mild calcification  of the aortic valve. Aortic valve regurgitation is mild. Aortic  regurgitation PHT measures 527 msec. Aortic valve sclerosis/calcification  is present, without any evidence of aortic  stenosis.   Venous: The inferior vena cava is dilated in size with less than 50%  respiratory variability, suggesting right atrial pressure of 15 mmHg.   Additional Comments: There is pleural effusion in the left lateral region.      Laboratory Data: High Sensitivity Troponin:  No results for input(s): TROPONINIHS in the last 720 hours.  Recent Labs  Lab 05/15/24 1322 05/15/24 1539  TRNPT 29*  29*      Chemistry Recent Labs  Lab 05/14/24 1412 05/15/24 0422 05/15/24 1322  NA 141 137 139  K 6.3* 5.5* 5.7*  CL 107* 106 106  CO2 17* 21* 20*  GLUCOSE  86 110* 119*  BUN 55* 57* 58*  CREATININE 1.86* 1.70* 1.82*  CALCIUM 8.8 8.6* 8.7*  MG  --  2.0  --   GFRNONAA  --  39* 36*  ANIONGAP  --  10 12    No results for input(s): PROT, ALBUMIN, AST, ALT, ALKPHOS, BILITOT in the last 168 hours. Lipids No results for input(s): CHOL, TRIG, HDL, LABVLDL, LDLCALC, CHOLHDL in the last 168 hours.  Hematology Recent Labs  Lab 05/14/24 1413 05/15/24 0422 05/15/24 1322  WBC 7.5 8.3 10.5  RBC 3.96* 3.88* 3.89*  HGB 12.7* 12.5* 12.4*  HCT 39.9 37.3* 38.2*  MCV 101* 96.1 98.2  MCH 32.1 32.2 31.9  MCHC 31.8 33.5 32.5  RDW 13.4 14.1 14.0  PLT 155 129* 141*   Thyroid  No results for input(s): TSH, FREET4 in the last 168 hours.  BNPNo results for input(s): BNP, PROBNP in the last 168 hours.  DDimer  Recent Labs  Lab 05/15/24 1354  DDIMER 1.06*    Radiology/Studies:  US  Venous Img Lower Bilateral Result Date: 05/15/2024 CLINICAL DATA:  Lower extremity edema at elevated D-dimer. EXAM: BILATERAL LOWER EXTREMITY VENOUS DOPPLER ULTRASOUND TECHNIQUE: Gray-scale sonography with graded compression, as well as color Doppler and duplex ultrasound were performed to evaluate the lower extremity deep venous systems from the level of the common femoral vein and including the common femoral, femoral, profunda femoral, popliteal and calf veins including the posterior tibial, peroneal and gastrocnemius veins when visible. The superficial great saphenous vein was also interrogated. Spectral Doppler was utilized to evaluate flow at rest and with distal augmentation maneuvers in the common femoral, femoral and popliteal veins. COMPARISON:  None Available. FINDINGS: RIGHT LOWER EXTREMITY Common Femoral Vein: No evidence of thrombus. Normal compressibility, respiratory phasicity and response to augmentation. Saphenofemoral Junction: No evidence of thrombus. Normal compressibility and flow on color Doppler imaging. Profunda Femoral Vein: No  evidence of thrombus. Normal compressibility and flow on color Doppler imaging. Femoral Vein: No evidence of thrombus. Normal compressibility, respiratory phasicity and response to augmentation. Popliteal Vein: No evidence of thrombus. Normal compressibility, respiratory phasicity and response to augmentation. Calf Veins: No evidence of thrombus. Normal compressibility and flow on color Doppler imaging. Superficial Great Saphenous Vein: No evidence of thrombus. Normal compressibility. Venous Reflux:  None. Other Findings: No evidence of superficial thrombophlebitis or abnormal fluid collection. LEFT LOWER EXTREMITY Common Femoral Vein: No evidence of thrombus. Normal compressibility, respiratory phasicity and response to augmentation. Saphenofemoral Junction: No evidence of thrombus. Normal compressibility and flow on color Doppler imaging. Profunda Femoral Vein: No evidence of thrombus. Normal compressibility and flow on color Doppler imaging. Femoral Vein: No evidence of thrombus. Normal compressibility, respiratory phasicity and response to augmentation. Popliteal Vein: No evidence of thrombus. Normal compressibility, respiratory phasicity and response to augmentation. Calf Veins: No evidence of thrombus. Normal compressibility and flow on color Doppler imaging. Superficial Great Saphenous Vein: No evidence of thrombus. Normal compressibility. Venous Reflux:  None. Other Findings: No evidence of superficial thrombophlebitis or abnormal fluid collection. IMPRESSION: No evidence of deep venous thrombosis in either lower extremity. Electronically Signed   By: Marcey Moan M.D.   On: 05/15/2024 16:23   DG Chest 2 View Result Date: 05/15/2024 CLINICAL DATA:  Chest pain and shortness of breath.  EXAM: CHEST - 2 VIEW COMPARISON:  09/07/2022 and chest CT 08/21/2023 FINDINGS: Lungs are adequately inflated demonstrate moderate size left pleural effusion likely with associated left basilar atelectasis worse compared to  the prior exam. Right lung is clear. Slightly more prominent cardiac silhouette which could represent recurrent pericardial effusion as seen on previous chest CT. Remainder the exam is unchanged. IMPRESSION: 1. Moderate size left pleural effusion likely with associated left basilar atelectasis worse compared to the prior exam. 2. Possible interval increased size of the cardiac silhouette which may represent recurrent pericardial effusion as seen on prior CT. Electronically Signed   By: Toribio Agreste M.D.   On: 05/15/2024 15:07     Assessment and Plan:  Gary [name] is a 89 y.o. male with a hx of pericardial effusion, permanent atrial flutter, hypertension, OSA on CPAP who is being seen 05/15/2024 for the evaluation of pericardial effusion at the request of Dr. Tobie.  Dyspnea Pericardial effusion Pleural effusion -- known hx of the same for several years that has been monitored. Previously treated with colchicine  with no improvement. Auto-immune work up has been negative.  -- seen in the office on 1/6 with worsening shortness of breath with repeat echo done showing LVEF of 60 to 65%, interventricular septum flattening consistent with RV pressure and volume overload, mildly reduced RV, PASP of 62 mmHg, biatrial dilation, very large primarily post anterior pericardial effusion measuring up to 4.6 cm with smaller focal components along the RA and RV with no evidence of tamponade, mild MR. Recommended for outpatient RHC with possible pericardiocentesis -- presented to the ED today with complaints of chest pain, and transferred to [facility] for further evaluation -- CXR with moderate pleural effusion  -- hemodynamically stable on exam, question whether effusion is now causing more of an issue given hyperkalemia, elevated Cr and pleural effusion  -- will keep NPO with plans for potential RHC/pericardiocentesis tomorrow  Chest pain -- hsTn 29>>29 -- symptoms are pleuritic in nature -- check CRP, sed rate    Hyperkalemia -- noted initially on outpatient labs, K+ 6.3 corrected in the ED this morning after dose of lokelma  -- K+ 5.7 this afternoon, s/p lokelma  10mg . Repeat dose ordered  -- follow up K+ in the morning   Permanent Atrial flutter -- rate controlled -- Eliquis  has been held since Tuesday in anticipation of RHC  HTN -- stable  -- hold home lisinopril  with elevated Cr  HLD -- continue Zocor    Risk Assessment/Risk Scores:   CHA2DS2-VASc Score = 3  This indicates a 3.2% annual risk of stroke. The patient's score is based upon: CHF History: 0 HTN History: 1 Diabetes History: 0 Stroke History: 0 Vascular Disease History: 0 Age Score: 2 Gender Score: 0        For questions or updates, please contact [facility] Please consult [email or web address] for contact info under      Signed, Manuelita Rummer, NP  05/15/2024 7:35 PM     [1] No Known Allergies  "

## 2024-05-15 NOTE — ED Triage Notes (Addendum)
 EDP @ [facility] called pt @0230  DR. Marty? To come be evaluated  K+ 6.3  And pt is scheduled for a cardiac cath on Friday

## 2024-05-16 ENCOUNTER — Ambulatory Visit (HOSPITAL_COMMUNITY): Admission: RE | Admit: 2024-05-16 | Admitting: Cardiovascular Disease

## 2024-05-16 ENCOUNTER — Encounter (HOSPITAL_COMMUNITY)
Admission: EM | Disposition: A | Discharge status: Home / Self Care | Payer: Self-pay | Source: Home / Self Care | Attending: Pulmonary Disease

## 2024-05-16 ENCOUNTER — Inpatient Hospital Stay (HOSPITAL_COMMUNITY)

## 2024-05-16 DIAGNOSIS — I3139 Other pericardial effusion (noninflammatory): Secondary | ICD-10-CM

## 2024-05-16 HISTORY — PX: RIGHT HEART CATH: CATH118263

## 2024-05-16 HISTORY — PX: PERICARDIOCENTESIS: CATH118255

## 2024-05-16 LAB — POCT I-STAT EG7
Acid-base deficit: 6 mmol/L — ABNORMAL HIGH (ref 0.0–2.0)
Acid-base deficit: 9 mmol/L — ABNORMAL HIGH (ref 0.0–2.0)
Bicarbonate: 16.1 mmol/L — ABNORMAL LOW (ref 20.0–28.0)
Bicarbonate: 18.4 mmol/L — ABNORMAL LOW (ref 20.0–28.0)
Calcium, Ion: 0.9 mmol/L — ABNORMAL LOW (ref 1.15–1.40)
Calcium, Ion: 1.18 mmol/L (ref 1.15–1.40)
HCT: 31 % — ABNORMAL LOW (ref 39.0–52.0)
HCT: 35 % — ABNORMAL LOW (ref 39.0–52.0)
Hemoglobin: 10.5 g/dL — ABNORMAL LOW (ref 13.0–17.0)
Hemoglobin: 11.9 g/dL — ABNORMAL LOW (ref 13.0–17.0)
O2 Saturation: 65 %
O2 Saturation: 65 %
Potassium: 3.9 mmol/L (ref 3.5–5.1)
Potassium: 4.6 mmol/L (ref 3.5–5.1)
Sodium: 140 mmol/L (ref 135–145)
Sodium: 146 mmol/L — ABNORMAL HIGH (ref 135–145)
TCO2: 17 mmol/L — ABNORMAL LOW (ref 22–32)
TCO2: 19 mmol/L — ABNORMAL LOW (ref 22–32)
pCO2, Ven: 29.9 mmHg — ABNORMAL LOW (ref 44–60)
pCO2, Ven: 33.7 mmHg — ABNORMAL LOW (ref 44–60)
pH, Ven: 7.339 (ref 7.25–7.43)
pH, Ven: 7.346 (ref 7.25–7.43)
pO2, Ven: 35 mmHg (ref 32–45)
pO2, Ven: 35 mmHg (ref 32–45)

## 2024-05-16 LAB — CBC
HCT: 35.3 % — ABNORMAL LOW (ref 39.0–52.0)
Hemoglobin: 11.3 g/dL — ABNORMAL LOW (ref 13.0–17.0)
MCH: 31.9 pg (ref 26.0–34.0)
MCHC: 32 g/dL (ref 30.0–36.0)
MCV: 99.7 fL (ref 80.0–100.0)
Platelets: 116 K/uL — ABNORMAL LOW (ref 150–400)
RBC: 3.54 MIL/uL — ABNORMAL LOW (ref 4.22–5.81)
RDW: 14 % (ref 11.5–15.5)
WBC: 8.2 K/uL (ref 4.0–10.5)
nRBC: 0 % (ref 0.0–0.2)

## 2024-05-16 LAB — BODY FLUID CELL COUNT WITH DIFFERENTIAL
Eos, Fluid: 0 %
Lymphs, Fluid: 1 %
Monocyte-Macrophage-Serous Fluid: 3 % — ABNORMAL LOW (ref 50–90)
Neutrophil Count, Fluid: 96 % — ABNORMAL HIGH (ref 0–25)
Total Nucleated Cell Count, Fluid: 22500 uL — ABNORMAL HIGH (ref 0–1000)

## 2024-05-16 LAB — ECHOCARDIOGRAM LIMITED
Height: 75 in
Weight: 4056.46 [oz_av]

## 2024-05-16 LAB — BASIC METABOLIC PANEL WITH GFR
Anion gap: 11 (ref 5–15)
BUN: 66 mg/dL — ABNORMAL HIGH (ref 8–23)
CO2: 19 mmol/L — ABNORMAL LOW (ref 22–32)
Calcium: 8.1 mg/dL — ABNORMAL LOW (ref 8.9–10.3)
Chloride: 106 mmol/L (ref 98–111)
Creatinine, Ser: 2.08 mg/dL — ABNORMAL HIGH (ref 0.61–1.24)
GFR, Estimated: 30 mL/min — ABNORMAL LOW
Glucose, Bld: 129 mg/dL — ABNORMAL HIGH (ref 70–99)
Potassium: 4.6 mmol/L (ref 3.5–5.1)
Sodium: 135 mmol/L (ref 135–145)

## 2024-05-16 LAB — GRAM STAIN

## 2024-05-16 LAB — C-REACTIVE PROTEIN: CRP: 9.1 mg/dL — ABNORMAL HIGH

## 2024-05-16 LAB — SEDIMENTATION RATE: Sed Rate: 12 mm/h (ref 0–16)

## 2024-05-16 LAB — MRSA NEXT GEN BY PCR, NASAL: MRSA by PCR Next Gen: NOT DETECTED

## 2024-05-16 MED ORDER — SODIUM CHLORIDE 0.9% FLUSH: 3.0000 mL | Freq: Two times a day (BID) | INTRAVENOUS | Status: DC

## 2024-05-16 MED ORDER — ACETAMINOPHEN 325 MG PO TABS
ORAL_TABLET | ORAL | Status: AC
  Filled 2024-05-16: qty 2

## 2024-05-16 MED ORDER — SODIUM CHLORIDE 0.9% FLUSH: 3.0000 mL | INTRAVENOUS | Status: DC | PRN

## 2024-05-16 MED ORDER — COLCHICINE 0.6 MG PO TABS
0.6000 mg | ORAL_TABLET | Freq: Every day | ORAL | Status: DC
  Administered 2024-05-16 – 2024-05-18 (×3): 0.6 mg via ORAL
  Filled 2024-05-16 (×3): qty 1

## 2024-05-16 MED ORDER — CHLORHEXIDINE GLUCONATE CLOTH 2 % EX PADS
6.0000 | MEDICATED_PAD | Freq: Every day | CUTANEOUS | Status: DC
  Administered 2024-05-16 – 2024-05-18 (×3): 6 via TOPICAL

## 2024-05-16 MED ORDER — HEPARIN (PORCINE) IN NACL 1000-0.9 UT/500ML-% IV SOLN
INTRAVENOUS | Status: DC | PRN
  Administered 2024-05-16: 500 mL

## 2024-05-16 MED ORDER — FENTANYL CITRATE (PF) 100 MCG/2ML IJ SOLN
INTRAMUSCULAR | Status: DC | PRN
  Administered 2024-05-16: 25 ug via INTRAVENOUS

## 2024-05-16 MED ORDER — LIDOCAINE HCL (PF) 1 % IJ SOLN
INTRAMUSCULAR | Status: DC | PRN
  Administered 2024-05-16 (×2): 5 mL

## 2024-05-16 MED ORDER — MIDAZOLAM HCL 2 MG/2ML IJ SOLN
INTRAMUSCULAR | Status: AC
  Filled 2024-05-16: qty 2

## 2024-05-16 MED ORDER — FENTANYL CITRATE (PF) 100 MCG/2ML IJ SOLN
INTRAMUSCULAR | Status: AC
  Filled 2024-05-16: qty 2

## 2024-05-16 MED ORDER — LIDOCAINE HCL (PF) 1 % IJ SOLN
INTRAMUSCULAR | Status: AC
  Filled 2024-05-16: qty 30

## 2024-05-16 MED ORDER — MIDAZOLAM HCL (PF) 2 MG/2ML IJ SOLN
INTRAMUSCULAR | Status: DC | PRN
  Administered 2024-05-16: 1 mg via INTRAVENOUS

## 2024-05-16 MED ORDER — HYDRALAZINE HCL 20 MG/ML IJ SOLN
10.0000 mg | INTRAMUSCULAR | Status: AC | PRN
  Administered 2024-05-16: 10 mg via INTRAVENOUS
  Filled 2024-05-16: qty 1

## 2024-05-16 MED ORDER — SODIUM CHLORIDE 0.9% FLUSH
3.0000 mL | Freq: Two times a day (BID) | INTRAVENOUS | Status: DC
  Administered 2024-05-16 – 2024-05-18 (×4): 3 mL via INTRAVENOUS

## 2024-05-16 MED ORDER — ASPIRIN 81 MG PO CHEW
81.0000 mg | CHEWABLE_TABLET | ORAL | Status: AC
  Administered 2024-05-16: 81 mg via ORAL
  Filled 2024-05-16: qty 1

## 2024-05-16 MED ORDER — SODIUM CHLORIDE 0.9 % IV SOLN: 250.0000 mL | INTRAVENOUS | Status: DC | PRN

## 2024-05-16 MED ORDER — SODIUM CHLORIDE 0.9 % IV SOLN: 250.0000 mL | INTRAVENOUS | Status: AC | PRN

## 2024-05-16 MED ORDER — IOHEXOL 350 MG/ML SOLN
INTRAVENOUS | Status: DC | PRN
  Administered 2024-05-16: 5 mL

## 2024-05-16 MED ORDER — FREE WATER
500.0000 mL | Freq: Once | Status: DC
  Administered 2024-05-16: 500 mL via ORAL

## 2024-05-16 NOTE — Consult Note (Signed)
 "  NAME:  Gary [name], MRN:  [id number], DOB:  [birth year], LOS: 1 ADMISSION DATE:  05/15/2024, CONSULTATION DATE:  1/16 REFERRING MD:  TRH, CHIEF COMPLAINT: pericardial effusion   History of Present Illness:  89 year old male with past medical history of arthritis, OSA on CPAP, hypertension, hyperlipidemia, CKD IIIA, atrial flutter on Eliquis , chronic large pericardial effusion most recent echo 05/09/23 without tamponade, who initially presented on 1/15 to ED.SABRA He had labs drawn on 1/14 showing hyperkalemia and was told to come to ED for treatment. He received treatment on 1/15 ~2am and then returned to ED later that morning for chest pain and shortness of breath. Noted that he was to have RHC and possible effusion drainage on 1/16 already scheduled. Cardiology consulted, recommended medical admit.   Labs in ED K 5.7, BUN 58, sCr 1.82, WBC 10.5, trop 29>29, BNP 1023 Pertinent  Medical History  OSA on CPAP, hypertension, hyperlipidemia, CKD IIIA, atrial flutter on Eliquis , chronic large pericardial effusion most recent echo 05/09/23 without tamponade  Significant Hospital Events: Including procedures, antibiotic start and stop dates in addition to other pertinent events   1/15: ED for hyperkalemia>dc>ED for SOB/CP>admit for RHC, pericardiocentesis  1/16: RHC Fick CI 2.88, PA 56/31, PAPi 1.9; pericardiocentesis with 1300cc removed > ICU after with pericardial drain  Interim History / Subjective:  Still having same central chest pressure. No shortness of breath.   Objective   Blood pressure (!) 149/79, pulse 67, temperature 99.1 F (37.3 C), temperature source Oral, resp. rate (!) 27, height 6' 3 (1.905 m), weight 115 kg, SpO2 96%.        Intake/Output Summary (Last 24 hours) at 05/16/2024 1350 Last data filed at 05/16/2024 1336 Gross per 24 hour  Intake 240 ml  Output 825 ml  Net -585 ml   Filed Weights   05/15/24 1319 05/16/24 0550  Weight: 68 kg 115 kg    Examination: General:  older male, sitting in bed, no distress  HENT: mm moist, room air, anicteric sclera  Lungs: CTAB, room air, resp even and unlabored  Cardiovascular: irregularly irregular rhythm, regular rate, no edema  Abdomen: rounded, soft  Extremities: warm, no edema  Neuro: awake, alert, oriented, non focal  GU: no foley   Resolved Hospital Problem list    Assessment & Plan:  Chronic large pericardial effusion s/p pericardiocentesis  Has been followed for 2 years. Presented with CP/SOB. Taken for RHC  Fick CI 2.88, PA 56/31, PAPi 1.9 Drain 1300cc, appears transudative > sent for cultures  - cardiology management - drain until <50cc/24h period  - role for colchicine ?  - tele monitor  - follow drain cultures   RV failure  pHTN Query primarily related to his OSA  - AHF to see, appreciate help in management  - RHC completed 1/16  Fick CI 2.88, PA 56/31, PAPi 1.9 - inotropes/diuretics per AHF - defer to AHF regarding need for further work up I.e. lactate, coox, cvp etc.  - tele monitor   AKI on CKD IIIa, worsening Hyperkalemia, resolved BUN 55>57>58>66 sCr 1.86>1.7>1.82>2.08  - diuresis per AHF - trend bmp, mag, phos - replete elytes - strict I&O - Avoid nephrotoxic agents, renally dose medications - ensure adequate renal perfusion   Atrial flutter on Eliquis  - tele monitor  - Eliquis  on hold with pericardial drain   OSA on CPAP  - son to bring home CPAP for him to use  - likely causing his severe PAH  - CPAP nightly  - O2  as needed   HTN HLD - simvastatin  20mg  EOD  - amlodipine  2.5mg  daily   BPH - tamsulosin  0.4mg  daily  Labs   CBC: Recent Labs  Lab 05/14/24 1413 05/15/24 0422 05/15/24 1322 05/16/24 0254  WBC 7.5 8.3 10.5 8.2  NEUTROABS  --  5.8  --   --   HGB 12.7* 12.5* 12.4* 11.3*  HCT 39.9 37.3* 38.2* 35.3*  MCV 101* 96.1 98.2 99.7  PLT 155 129* 141* 116*    Basic Metabolic Panel: Recent Labs  Lab 05/14/24 1412 05/15/24 0422 05/15/24 1322  05/16/24 0254  NA 141 137 139 135  K 6.3* 5.5* 5.7* 4.6  CL 107* 106 106 106  CO2 17* 21* 20* 19*  GLUCOSE 86 110* 119* 129*  BUN 55* 57* 58* 66*  CREATININE 1.86* 1.70* 1.82* 2.08*  CALCIUM 8.8 8.6* 8.7* 8.1*  MG  --  2.0  --   --    GFR: Estimated Creatinine Clearance: 34.9 mL/min (A) (by C-G formula based on SCr of 2.08 mg/dL (H)). Recent Labs  Lab 05/14/24 1413 05/15/24 0422 05/15/24 1322 05/16/24 0254  WBC 7.5 8.3 10.5 8.2    Liver Function Tests: No results for input(s): AST, ALT, ALKPHOS, BILITOT, PROT, ALBUMIN in the last 168 hours. No results for input(s): LIPASE, AMYLASE in the last 168 hours. No results for input(s): AMMONIA in the last 168 hours.  ABG    Component Value Date/Time   HCO3 24.0 09/11/2022 1249   HCO3 23.7 09/11/2022 1249   TCO2 25 09/11/2022 1249   TCO2 25 09/11/2022 1249   ACIDBASEDEF 2.0 09/11/2022 1249   ACIDBASEDEF 2.0 09/11/2022 1249   O2SAT 66 09/11/2022 1249   O2SAT 66 09/11/2022 1249     Coagulation Profile: No results for input(s): INR, PROTIME in the last 168 hours.  Cardiac Enzymes: No results for input(s): CKTOTAL, CKMB, CKMBINDEX, TROPONINI in the last 168 hours.  HbA1C: Hgb A1c MFr Bld  Date/Time Value Ref Range Status  02/26/2024 09:44 AM 6.1 4.6 - 6.5 % Final    Comment:    Glycemic Control Guidelines for People with Diabetes:Non Diabetic:  <6%Goal of Therapy: <7%Additional Action Suggested:  >8%   02/19/2023 09:09 AM 6.2 4.6 - 6.5 % Final    Comment:    Glycemic Control Guidelines for People with Diabetes:Non Diabetic:  <6%Goal of Therapy: <7%Additional Action Suggested:  >8%     CBG: No results for input(s): GLUCAP in the last 168 hours.  Review of Systems:   As above   Past Medical History:  He,  has a past medical history of Adenomatous colon polyp (06/2000), ALLERGIC RHINITIS (10/03/2007), Allergy, Arthritis, Atrial flutter (HCC) (06/26/2014), Cancer [facility]), CARDIAC MURMUR,  AORTIC (10/03/2007), Colon polyps, ERECTILE DYSFUNCTION (10/03/2007), High blood pressure (03/2016), HYPERLIPIDEMIA (10/03/2007), Rosacea, and Sleep apnea.   Surgical History:   Past Surgical History:  Procedure Laterality Date   ADENOIDECTOMY     CARDIOVERSION N/A 07/01/2014   Procedure: CARDIOVERSION;  Surgeon: Jerel Balding, MD;  Location: [facility] ENDOSCOPY;  Service: Cardiovascular;  Laterality: N/A;   COLONOSCOPY     PALATE SURGERY     RIGHT HEART CATH N/A 09/11/2022   Procedure: RIGHT HEART CATH;  Surgeon: Court Dorn PARAS, MD;  Location: [facility] INVASIVE CV LAB;  Service: Cardiovascular;  Laterality: N/A;   TEE WITHOUT CARDIOVERSION N/A 07/01/2014   Procedure: TRANSESOPHAGEAL ECHOCARDIOGRAM (TEE);  Surgeon: Jerel Balding, MD;  Location: [facility] ENDOSCOPY;  Service: Cardiovascular;  Laterality: N/A;   TONSILLECTOMY  TOTAL KNEE ARTHROPLASTY Right 08/27/2017   Procedure: RIGHT TOTAL KNEE ARTHROPLASTY;  Surgeon: Rubie Kemps, MD;  Location: [facility] OR;  Service: Orthopedics;  Laterality: Right;     Social History:   reports that he has quit smoking. He has never used smokeless tobacco. He reports current alcohol use of about 8.0 standard drinks of alcohol per week. He reports that he does not use drugs.   Family History:  His family history includes CAD in his brother; Cancer in his father; Diabetes in his mother; Hypotension in his sister; Liver cancer in his father; Stomach cancer in his paternal aunt.   Allergies Allergies[1]   Home Medications  Prior to Admission medications  Medication Sig Start Date End Date Taking? Authorizing Provider  allopurinol  (ZYLOPRIM ) 100 MG tablet TAKE 2 TABLETS EVERY DAY 02/15/24   Burchette, Wolm ORN, MD  amLODipine  (NORVASC ) 2.5 MG tablet Take 1 tablet (2.5 mg total) by mouth daily. 02/26/24   Burchette, Wolm ORN, MD  apixaban  (ELIQUIS ) 5 MG TABS tablet Take 1 tablet (5 mg total) by mouth 2 (two) times daily. 02/26/24   Burchette, Wolm ORN, MD  co-enzyme Q-10 30 MG  capsule Take 30 mg by mouth daily.    [provider]  fluticasone  (FLONASE ) 50 MCG/ACT nasal spray PLACE 1 SPRAY INTO BOTH NOSTRILS EVERY OTHER DAY. Patient taking differently: Place 1 spray into both nostrils daily as needed for allergies or rhinitis. 04/18/23   Burchette, Wolm ORN, MD  lisinopril  (ZESTRIL ) 10 MG tablet Take 1 tablet (10 mg total) by mouth daily. 05/15/24   Palumbo, April, MD  minocycline  (MINOCIN ) 100 MG capsule Take 100 mg by mouth every other day. 07/25/23   [provider]  Multiple Vitamin (MULTIVITAMIN) tablet Take 1 tablet by mouth daily.    [provider]  simvastatin  (ZOCOR ) 20 MG tablet TAKE 1 TABLET EVERY OTHER DAY 04/09/24   Burchette, Wolm ORN, MD  tamsulosin  (FLOMAX ) 0.4 MG CAPS capsule TAKE 1 CAPSULE EVERY DAY 03/13/24   Burchette, Wolm ORN, MD     Critical care time: 66   The patient is critically ill with multiple organ system failure and requires high complexity decision making for assessment and support, frequent evaluation and titration of therapies, advanced monitoring, review of radiographic studies and interpretation of complex data.    Critical Care Time devoted to patient care services, exclusive of separately billable procedures, described in this note is 2   Tinnie FORBES Adolph DEVONNA [facility] 05/16/24 3:25 PM  Please see [email or web address] for pager details. From 7A-7P if no response, please call [phone]          [1] No Known Allergies  "

## 2024-05-16 NOTE — Progress Notes (Signed)
 [facility]  PROGRESS NOTE  Gary [name] FMW:[id number] DOB: [birth year] DOA: 05/15/2024 PCP: Micheal Wolm ORN, MD   Brief HPI:    89 y.o. male with medical history significant for permanent atrial flutter on Eliquis , chronic large pericardial effusion, CKD stage IIIa, HTN, HLD, gout, BPH, rosacea, OSA on CPAP who presented to the ED for evaluation of abnormal labs and shortness of breath.   Patient has been following with cardiology for atrial flutter and chronic large pericardial effusion.  TTE 05/08/2024 showed EF 60-65%, IV septum flattening consistent with RV pressure and volume overload, severely elevated PA pressure, very large posterior pericardial effusion measuring up to 4.6 cm without evidence of tamponade.    Assessment/Plan:   Chronic large pericardial effusion: Patient with chronic large pericardial effusion of unclear etiology, previous autoimmune workup has been negative.  Recent TTE 05/08/2024 showed evidence of RV pressure and volume overload, severely elevated PA systolic pressure, no evidence of tamponade.  He has been experiencing pleuritic chest discomfort.  Troponin T is minimally elevated, not consistent with ACS. - Cardiology following - Planning for RHC and possible pericardiocentesis t today - Eliquis  on hold (per patient last dose was on 05/13/2024)   Acute kidney injury superimposed on CKD stage IIIa: Creatinine 2.08,  last creatinine was 1.67 on 02/26/2024 with previous baseline 1.3-1.4.   - Hold lisinopril , avoid NSAIDs - Obtain urine studies - Monitor UOP, bladder scan and in-N-Out cath as needed -Follow renal function in a.m.    Hyperkalemia: Resolved   Permanent atrial flutter: Remains in atrial flutter with variable block, rate is controlled.   Appears that he is not on beta-blocker due to history of slow ventricular response.   Eliquis  on hold as above.   Hypertension: Continue amlodipine .   Holding lisinopril  due to renal insufficiency  as above   Hyperlipidemia: Continue simvastatin .   Thrombocytopenia: Mild without obvious bleeding.   Continue to monitor.   BPH: Continue Flomax .   Gout: Holding allopurinol  given renal dysfunction.   OSA: Continue CPAP nightly.      DVT prophylaxis: SCDs  Medications     amLODipine   2.5 mg Oral Daily   free water   500 mL Oral Once   simvastatin   20 mg Oral QODAY   sodium chloride  flush  3 mL Intravenous Q12H   sodium chloride  flush  3 mL Intravenous Q12H   tamsulosin   0.4 mg Oral Daily     Data Reviewed:   CBG:  No results for input(s): GLUCAP in the last 168 hours.  SpO2: 94 %    Vitals:   05/15/24 2326 05/16/24 0126 05/16/24 0359 05/16/24 0550  BP: 114/69  (!) 102/43   Pulse: 64 60 61 (!) 56  Resp: 20 19 17  (!) 27  Temp: 98.1 F (36.7 C)  99 F (37.2 C)   TempSrc: Oral  Oral   SpO2: 95% 94% 92% 94%  Weight:    115 kg  Height:          Data Reviewed:  Basic Metabolic Panel: Recent Labs  Lab 05/14/24 1412 05/15/24 0422 05/15/24 1322 05/16/24 0254  NA 141 137 139 135  K 6.3* 5.5* 5.7* 4.6  CL 107* 106 106 106  CO2 17* 21* 20* 19*  GLUCOSE 86 110* 119* 129*  BUN 55* 57* 58* 66*  CREATININE 1.86* 1.70* 1.82* 2.08*  CALCIUM  8.8 8.6* 8.7* 8.1*  MG  --  2.0  --   --     CBC: Recent Labs  Lab 05/14/24 1413 05/15/24 0422 05/15/24 1322 05/16/24 0254  WBC 7.5 8.3 10.5 8.2  NEUTROABS  --  5.8  --   --   HGB 12.7* 12.5* 12.4* 11.3*  HCT 39.9 37.3* 38.2* 35.3*  MCV 101* 96.1 98.2 99.7  PLT 155 129* 141* 116*    LFT No results for input(s): AST, ALT, ALKPHOS, BILITOT, PROT, ALBUMIN in the last 168 hours.   Antibiotics: Anti-infectives (From admission, onward)    None        CONSULTS cardiology  Code Status: Full code  Family Communication: No family at bedside     Subjective   Patient seen and examined, denies any complaints.   Objective    Physical Examination:  General-appears in no acute  distress Heart-S1-S2, regular, no murmur auscultated Lungs-clear to auscultation bilaterally, no wheezing or crackles auscultated Abdomen-soft, nontender, no organomegaly Extremities-no edema in the lower extremities Neuro-alert, oriented x3, no focal deficit noted            Siria Calandro S Zitlaly Malson   [facility] If 7PM-7AM, please contact night-coverage at [email or web address], Office  [phone]   05/16/2024, 8:13 AM  LOS: 1 day

## 2024-05-16 NOTE — Progress Notes (Signed)
" °   05/16/24 2129  BiPAP/CPAP/SIPAP  BiPAP/CPAP/SIPAP Pt Type Adult  Reason BIPAP/CPAP not in use Non-compliant (pt states he will be fine without CPAP)  BiPAP/CPAP /SiPAP Vitals  Bilateral Breath Sounds Clear;Diminished    "

## 2024-05-16 NOTE — Progress Notes (Addendum)
 "    Advanced Heart Failure Rounding Note  Cardiologist: Jerel Balding, MD  Chief Complaint: Chronic large pericardial effusion Patient Profile   Gary [name] is a 89 y.o. male with permanent atrial flutter on Eliquis , chronic large pericardial effusion, CKD stage IIIa, hypertension, hyperlipidemia, gout, BPH and OSA on CPAP.  Significant events:   05/15/24: Admitted with chronic large pericardial effusion.  Echo 05/08/2024 with evidence of RV pressure and volume overload, severely elevated PA systolic pressure, no evidence of tamponade.  NSTEMI pleuritic chest discomfort.  Admitted for pericardiocentesis. 05/16/2024: Underwent successful pericardiocentesis, 1300 mL removed, pericardial drain in place.  RHC: RA 13, PA 56/31 (31), PCWP 16, PAPi 1.9, Fick CO/CI 5.6/2.3 PVR 2.7 WU (numbers recalculated as cath lab used wrong weight)  Subjective:    Resting comfortably in bed, family at bedside.  Denies chest pain or shortness of breath.  Objective:   Weight Range: 115 kg Body mass index is 31.69 kg/m.   Vital Signs:   Temp:  [98.1 F (36.7 C)-99.1 F (37.3 C)] 99.1 F (37.3 C) (01/16 0910) Pulse Rate:  [0-101] 69 (01/16 1418) Resp:  [17-27] 21 (01/16 1418) BP: (102-159)/(43-87) 148/68 (01/16 1415) SpO2:  [86 %-98 %] 96 % (01/16 1418) Weight:  [884 kg] 115 kg (01/16 0550) Last BM Date : 05/15/24  Weight change: Filed Weights   05/15/24 1319 05/16/24 0550  Weight: 68 kg 115 kg    Intake/Output:   Intake/Output Summary (Last 24 hours) at 05/16/2024 1527 Last data filed at 05/16/2024 1418 Gross per 24 hour  Intake 740 ml  Output 965 ml  Net -225 ml     Physical Exam   General: Elderly appearing.   Cor: Regular rate & irregular rhythm. No murmurs. JVD ~7/8 cm.  Lungs: clear Extremities: no edema   Telemetry   A-fib 70s (Personally reviewed)    Labs   CBC Recent Labs    05/15/24 0422 05/15/24 1322 05/16/24 0254  WBC 8.3 10.5 8.2  NEUTROABS 5.8  --   --    HGB 12.5* 12.4* 11.3*  HCT 37.3* 38.2* 35.3*  MCV 96.1 98.2 99.7  PLT 129* 141* 116*   Basic Metabolic Panel Recent Labs    98/84/73 0422 05/15/24 1322 05/16/24 0254  NA 137 139 135  K 5.5* 5.7* 4.6  CL 106 106 106  CO2 21* 20* 19*  GLUCOSE 110* 119* 129*  BUN 57* 58* 66*  CREATININE 1.70* 1.82* 2.08*  CALCIUM  8.6* 8.7* 8.1*  MG 2.0  --   --    Liver Function Tests No results for input(s): AST, ALT, ALKPHOS, BILITOT, PROT, ALBUMIN in the last 72 hours. No results for input(s): LIPASE, AMYLASE in the last 72 hours. Cardiac Enzymes No results for input(s): CKTOTAL, CKMB, CKMBINDEX, TROPONINI in the last 72 hours.  BNP: BNP (last 3 results) No results for input(s): BNP in the last 8760 hours.  ProBNP (last 3 results) Recent Labs    05/15/24 1939  PROBNP 1,023.0*     D-Dimer Recent Labs    05/15/24 1354  DDIMER 1.06*   Hemoglobin A1C No results for input(s): HGBA1C in the last 72 hours. Fasting Lipid Panel No results for input(s): CHOL, HDL, LDLCALC, TRIG, CHOLHDL, LDLDIRECT in the last 72 hours. Medications:   Scheduled Medications:  acetaminophen        amLODipine   2.5 mg Oral Daily   Chlorhexidine  Gluconate Cloth  6 each Topical Daily   colchicine   0.6 mg Oral Daily   simvastatin   20 mg Oral QODAY   sodium chloride  flush  3 mL Intravenous Q12H   sodium chloride  flush  3 mL Intravenous Q12H   tamsulosin   0.4 mg Oral Daily    Infusions:  sodium chloride       PRN Medications: sodium chloride , acetaminophen , acetaminophen  **OR** acetaminophen , hydrALAZINE , ondansetron  **OR** ondansetron  (ZOFRAN ) IV, senna-docusate, sodium chloride  flush  Assessment/Plan   Chronic large pericardial effusion - S/p pericardiocentesis 05/16/2024.  -1300 mL. - Pericardial drain in place, plan for possible removal tomorrow if draining less than 50 cc in 24 hours. - Start colchicine  0.6 daily - Fluid studies pending - Holding  Eliquis  - Will need repeat echo.  Pulmonary hypertension with RV failure - RHC: RA 13, PA 56/31 (31), PCWP 16, PAPi 1.9, Fick CO/CI 5.6/2.3 PVR 2.7 WU - Appears euvolemic on exam.  Not on diuretics at home, would not need aggressive diuresis with RV failure. - May benefit from sildenafil  initiation  Permanent atrial flutter - Currently holding Eliquis  - Rate controlled on telemetry - Known history of OSA, on CPAP  AKI on CKD stage IIIa - SCr baseline around 1.6 - Up to 2 today, avoid hypotension - No diuretics at this time  Hypertension - BP elevated, starting home meds  OSA on CPAP  HLD - Continue statin - LDL 90 10/25   CRITICAL CARE Performed by: Beckey LITTIE Coe   Total critical care time: 15 minutes  Critical care time was exclusive of separately billable procedures and treating other patients.  Critical care was necessary to treat or prevent imminent or life-threatening deterioration.  Critical care was time spent personally by me on the following activities: development of treatment plan with patient and/or surrogate as well as nursing, discussions with consultants, evaluation of patient's response to treatment, examination of patient, obtaining history from patient or surrogate, ordering and performing treatments and interventions, ordering and review of laboratory studies, ordering and review of radiographic studies, pulse oximetry and re-evaluation of patient's condition.   Length of Stay: 1  Beckey LITTIE Coe, NP  05/16/2024, 3:27 PM  Advanced Heart Failure Team Pager [phone] (M-F; 7a - 5p)   Please visit [email or web address]: For overnight coverage please call cardiology fellow first. If fellow not available call Shock/ECMO MD on call.  For ECMO / Mechanical Support (Impella, IABP, LVAD) issues call Shock / ECMO MD on call.    Agree with above  89 y/o male with OSA, remote tobacco use, HTN, chronic AFL and chronic pericardial effusion transferred to ICU after RHC and  pericardiocentesis  Feels better after drain. Still with some CP. Preliminary fluid analysis looks inflammatory.   Echo with evidence of PAH and RV failure  RHC: RA 13, PA 56/31 (31), PCWP 16, PAPi 1.9, Fick CO/CI 5.6/2.3 PVR 2.7 WU  General:  Sitting up in bed. No resp difficulty HEENT: normal Neck: supple. JVP to jaw  Cor: Irregular rate & rhythm. No rubs, gallops or murmurs.Drain ok Lungs: clear Abdomen: soft, nontender, nondistended.Good bowel sounds. Extremities: no cyanosis, clubbing, rash, tr edema Neuro: alert & orientedx3, cranial nerves grossly intact. moves all 4 extremities w/o difficulty. Affect pleasant  Etiology of pericardial effusion remains unclear. Initial fluid analysis suggests acute inflammation. Denies recent URI. Previous CTD serologies negative. Symptoms suggestive of acute pericarditis.   Will keep drain in. Start colchicine . Await cytology.   He has mild PAH But likely underestimated given degree of RV dysfunction. Suspect related to OSA but this has been well treated.Will need to ensure that his dowloads  show adequate oxygenation at night.  Pulmonary pressures were elevated on echo back in 5/24 as well. Will repeat basic PH serology. CTEPH unlikely in face of chronic AC.Consider trial of sildenafil . Consider cMRI.   Hold AC for now.   CRITICAL CARE Performed by: Cherrie Sieving  Total critical care time: 45 minutes  Critical care time was exclusive of separately billable procedures and treating other patients.  Critical care was necessary to treat or prevent imminent or life-threatening deterioration.  Critical care was time spent personally by me (independent of midlevel providers or residents) on the following activities: development of treatment plan with patient and/or surrogate as well as nursing, discussions with consultants, evaluation of patient's response to treatment, examination of patient, obtaining history from patient or surrogate,  ordering and performing treatments and interventions, ordering and review of laboratory studies, ordering and review of radiographic studies, pulse oximetry and re-evaluation of patient's condition.  Sieving Cherrie, MD  8:22 PM   "

## 2024-05-16 NOTE — Progress Notes (Signed)
 Echocardiogram 2D Echocardiogram has been performed.  Gary [name] 05/16/2024, 1:07 PM

## 2024-05-16 NOTE — Progress Notes (Addendum)
 "  Rounding Note   Patient Name: Gary [name] Date of Encounter: 05/16/2024  [facility] Cardiologist: Jerel Balding, MD   Subjective  Denies any CP, last episode of pleuritic chest pain was yesterday. No significant SOB.   Scheduled Meds:  amLODipine   2.5 mg Oral Daily   free water   500 mL Oral Once   simvastatin   20 mg Oral QODAY   sodium chloride  flush  3 mL Intravenous Q12H   sodium chloride  flush  3 mL Intravenous Q12H   tamsulosin   0.4 mg Oral Daily   Continuous Infusions:  sodium chloride      PRN Meds: sodium chloride , acetaminophen  **OR** acetaminophen , ondansetron  **OR** ondansetron  (ZOFRAN ) IV, senna-docusate, sodium chloride  flush   Vital Signs  Vitals:   05/15/24 2326 05/16/24 0126 05/16/24 0359 05/16/24 0550  BP: 114/69  (!) 102/43   Pulse: 64 60 61 (!) 56  Resp: 20 19 17  (!) 27  Temp: 98.1 F (36.7 C)  99 F (37.2 C)   TempSrc: Oral  Oral   SpO2: 95% 94% 92% 94%  Weight:    115 kg  Height:        Intake/Output Summary (Last 24 hours) at 05/16/2024 0802 Last data filed at 05/16/2024 0558 Gross per 24 hour  Intake 240 ml  Output 375 ml  Net -135 ml      05/16/2024    5:50 AM 05/15/2024    1:19 PM 05/15/2024    4:14 AM  Last 3 Weights  Weight (lbs) 253 lb 8.5 oz 150 lb 245 lb  Weight (kg) 115 kg 68.04 kg 111.131 kg      Telemetry Atrial flutter, no significant ventricular ectopy - Personally Reviewed  ECG  Atrial flutter with persistent ST elevation in the inferolateral leads, no contralateral ST depression.  - Personally Reviewed  Physical Exam  GEN: No acute distress.   Neck: No JVD Cardiac: RRR, no murmurs, rubs, or gallops.  Respiratory: Clear to auscultation bilaterally. GI: Soft, nontender, non-distended  MS: No edema; No deformity. Neuro:  Nonfocal  Psych: Normal affect   Labs High Sensitivity Troponin:  No results for input(s): TROPONINIHS in the last 720 hours.   Chemistry Recent Labs  Lab 05/15/24 0422  05/15/24 1322 05/16/24 0254  NA 137 139 135  K 5.5* 5.7* 4.6  CL 106 106 106  CO2 21* 20* 19*  GLUCOSE 110* 119* 129*  BUN 57* 58* 66*  CREATININE 1.70* 1.82* 2.08*  CALCIUM  8.6* 8.7* 8.1*  MG 2.0  --   --   GFRNONAA 39* 36* 30*  ANIONGAP 10 12 11     Lipids No results for input(s): CHOL, TRIG, HDL, LABVLDL, LDLCALC, CHOLHDL in the last 168 hours.  Hematology Recent Labs  Lab 05/15/24 0422 05/15/24 1322 05/16/24 0254  WBC 8.3 10.5 8.2  RBC 3.88* 3.89* 3.54*  HGB 12.5* 12.4* 11.3*  HCT 37.3* 38.2* 35.3*  MCV 96.1 98.2 99.7  MCH 32.2 31.9 31.9  MCHC 33.5 32.5 32.0  RDW 14.1 14.0 14.0  PLT 129* 141* 116*   Thyroid  No results for input(s): TSH, FREET4 in the last 168 hours.  BNP Recent Labs  Lab 05/15/24 1939  PROBNP 1,023.0*    DDimer  Recent Labs  Lab 05/15/24 1354  DDIMER 1.06*     Radiology  US  Venous Img Lower Bilateral Result Date: 05/15/2024 CLINICAL DATA:  Lower extremity edema at elevated D-dimer. EXAM: BILATERAL LOWER EXTREMITY VENOUS DOPPLER ULTRASOUND TECHNIQUE: Gray-scale sonography with graded compression, as well  as color Doppler and duplex ultrasound were performed to evaluate the lower extremity deep venous systems from the level of the common femoral vein and including the common femoral, femoral, profunda femoral, popliteal and calf veins including the posterior tibial, peroneal and gastrocnemius veins when visible. The superficial great saphenous vein was also interrogated. Spectral Doppler was utilized to evaluate flow at rest and with distal augmentation maneuvers in the common femoral, femoral and popliteal veins. COMPARISON:  None Available. FINDINGS: RIGHT LOWER EXTREMITY Common Femoral Vein: No evidence of thrombus. Normal compressibility, respiratory phasicity and response to augmentation. Saphenofemoral Junction: No evidence of thrombus. Normal compressibility and flow on color Doppler imaging. Profunda Femoral Vein: No evidence  of thrombus. Normal compressibility and flow on color Doppler imaging. Femoral Vein: No evidence of thrombus. Normal compressibility, respiratory phasicity and response to augmentation. Popliteal Vein: No evidence of thrombus. Normal compressibility, respiratory phasicity and response to augmentation. Calf Veins: No evidence of thrombus. Normal compressibility and flow on color Doppler imaging. Superficial Great Saphenous Vein: No evidence of thrombus. Normal compressibility. Venous Reflux:  None. Other Findings: No evidence of superficial thrombophlebitis or abnormal fluid collection. LEFT LOWER EXTREMITY Common Femoral Vein: No evidence of thrombus. Normal compressibility, respiratory phasicity and response to augmentation. Saphenofemoral Junction: No evidence of thrombus. Normal compressibility and flow on color Doppler imaging. Profunda Femoral Vein: No evidence of thrombus. Normal compressibility and flow on color Doppler imaging. Femoral Vein: No evidence of thrombus. Normal compressibility, respiratory phasicity and response to augmentation. Popliteal Vein: No evidence of thrombus. Normal compressibility, respiratory phasicity and response to augmentation. Calf Veins: No evidence of thrombus. Normal compressibility and flow on color Doppler imaging. Superficial Great Saphenous Vein: No evidence of thrombus. Normal compressibility. Venous Reflux:  None. Other Findings: No evidence of superficial thrombophlebitis or abnormal fluid collection. IMPRESSION: No evidence of deep venous thrombosis in either lower extremity. Electronically Signed   By: Marcey Moan M.D.   On: 05/15/2024 16:23   DG Chest 2 View Result Date: 05/15/2024 CLINICAL DATA:  Chest pain and shortness of breath. EXAM: CHEST - 2 VIEW COMPARISON:  09/07/2022 and chest CT 08/21/2023 FINDINGS: Lungs are adequately inflated demonstrate moderate size left pleural effusion likely with associated left basilar atelectasis worse compared to the  prior exam. Right lung is clear. Slightly more prominent cardiac silhouette which could represent recurrent pericardial effusion as seen on previous chest CT. Remainder the exam is unchanged. IMPRESSION: 1. Moderate size left pleural effusion likely with associated left basilar atelectasis worse compared to the prior exam. 2. Possible interval increased size of the cardiac silhouette which may represent recurrent pericardial effusion as seen on prior CT. Electronically Signed   By: Toribio Agreste M.D.   On: 05/15/2024 15:07    Cardiac Studies  Limited echo 05/08/2024 1. Left ventricular ejection fraction, by estimation, is 60 to 65%. The  left ventricle has normal function. The left ventricle has no regional  wall motion abnormalities. There is mild concentric left ventricular  hypertrophy. Left ventricular diastolic  parameters are indeterminate. There is the interventricular septum is  flattened in systole and diastole, consistent with right ventricular  pressure and volume overload.   2. Right ventricular systolic function is mildly reduced. The right  ventricular size is moderately enlarged. There is severely elevated  pulmonary artery systolic pressure. The estimated right ventricular  systolic pressure is 62.9 mmHg.   3. Left atrial size was moderately dilated.   4. Right atrial size was severely dilated.   5. There  is a very large primarily posterior pericardial effusion  measuring up to 4.6cm with smaller focall components along the RA and RV.  There is no evdicence of tamponade. Unchanged since 02/25/24. Large  pericardial effusion. The pericardial  effusion is posterior to the left ventricle. There is no evidence of  cardiac tamponade.   6. The mitral valve is myxomatous. Mild mitral valve regurgitation.   7. Tricuspid valve regurgitation is moderate.   8. The aortic valve is tricuspid. There is mild calcification of the  aortic valve. Aortic valve regurgitation is mild. Aortic  valve  sclerosis/calcification is present, without any evidence of aortic  stenosis.   9. The inferior vena cava is dilated in size with <50% respiratory  variability, suggesting right atrial pressure of 15 mmHg.   Conclusion(s)/Recommendation(s): There is a very large primarily posterior  pericardial effusion measuring up to 4.6cm with smaller focall components  along the RA and RV. There is no evdicence of tamponade. Unchanged since  02/25/24.   Patient Profile   89 y.o. male with PMH of permanent atrial flutter on Eliquis , chronic large pericardial effusion, CKD stage IIIa, HTN, HLD, BPH, rosacea, and OSA on CPAP who presented with dyspnea and hyperkalemia  Assessment & Plan   Chronic large pericardial effusion  - diagnosed in May 2024, autoimmune work up negative. Previously treated with cholchicine without improvement.   - recent echo showed EF 60-65%, PASP 62 mmHg, very large primarily posterior pericardial effusion measuring 4.6 cm, no evidence of tamponade, mild MR, moderate TR  - pending RHC today with possible pericardiocentesis. Eliquis  has been held since 1/13 in anticipation for RHC  - pleuritic chest pain resolved since yesterday, question if need another course of colchicine  after pericardiocentesis.   Pleuritic chest pain  - serial trop 29 --> 29, flat, inconsistent with ACS   Acute on chronic renal insufficiency: baseline Cr 1.4, Cr trended up to 2.08 today  Permanent atrial flutter: Eliquis  on hold since 1/13 in anticipation of RHC (last dose of Eliquis  the night of 1/13)  Hyperkalemia: treated with Lokelma   OSA on CPAP  Hypertension: continue amlodipine   Risk and benefit of RHC and pericardiocentesis discussed with the patient including significant bleeding, injury to the heart, MI, stroke, and loss of life.  Patient is agreeable to proceed.   For questions or updates, please contact [facility] Please consult [email or web address] for contact info under      Signed, Scot Ford, PA  05/16/2024, 8:02 AM     I have seen and examined the patient along with Scot Ford, PA .  I have reviewed the chart, notes and new data.  I agree with PA/NP's note.  Key new complaints: chest pain yesterday was clearly pleuritic, although not positional.  It has resolved.  He actually feels quite well.  He does not have dyspnea.  He has not had any syncope since the event 2 years ago. Key examination changes: Blood pressure is borderline low.  Elevated jugular venous pulsations, roughly 10 cm.  Diminished breath sounds left lung base.  Irregular rhythm, no audible pericardial rub or murmurs.  Trivial symmetrical retromalleolar edema. Key new findings / data: Chest x-ray shows a new left pleural effusion.  Remains in permanent atrial flutter, spontaneously rate controlled.  Worsening renal parameters suggest possible reduction in cardiac output.  Potassium now normal.  CRP is markedly elevated, where it has been normal when previously checked for evaluation of his chronic pericardial effusion.  PLAN: PAH: New development, etiology uncertain.  He has never had any manifestations of left heart failure and does not have chronic lung disease.  Previous workup for collagen vascular disease was unrevealing.  He is on chronic anticoagulation which makes thromboembolic pulmonary hypertension unlikely.  Right heart catheterization today for direct measurement of pulmonary artery pressures and to establish whether this is precapillary or postcapillary in mechanism. Right heart failure: Has clear evidence of increased right heart filling pressures and reduced cardiac output may be causing his worsening renal insufficiency. Pericardial effusion: The etiology is unclear.  A large effusion has been present for the last couple of years and has generally been asymptomatic.  He now has had transient pleuritic chest pain and has a hint of ST segment elevation on his ECG (this is actually even  more obvious on his telemetry monitor lead).  Now has elevated CRP, while in the past CRP and ESR were normal and there was no change in the pericardial effusion after treatment with colchicine .  He also has new evidence of increased intracardiac filling pressures (at least right sided: Marked JVD on exam and a plethoric inferior vena cava on echo).  If right heart catheterization shows pressure equalization and ventricular interdependence findings, he should undergo pericardiocentesis.  Maybe a sample of fluid will assist in making a diagnosis. Left pleural effusion: This is a new abnormality and might be a hydrostatic effusion due to new right heart failure or could be due to the same inflammatory process that is contributing to his pericardial effusion. A-flutter: Longstanding asymptomatic arrhythmia with spontaneous rate control.  Probably also has AV node disease and will eventually need a pacemaker if he has symptomatic bradycardia.  He had a syncopal event 2 years ago that has not recurred and we have deferred pacemaker implantation for now.  Jerel Balding, MD, [facility] [phone] 05/16/2024, 11:04 AM   "

## 2024-05-16 NOTE — Interval H&P Note (Signed)
 History and Physical Interval Note:  05/16/2024 11:54 AM  Gary [name]  has presented today for pericardiocentesis with the diagnosis of pericardial effusion. The various methods of treatment have been discussed with the patient and family. After consideration of risks, benefits and other options for treatment, the patient has consented to  Procedures: RIGHT HEART CATH (N/A) PERICARDIOCENTESIS (N/A) as a surgical intervention.  The patient's history has been reviewed, patient examined, no change in status, stable for surgery.  I have reviewed the patient's chart and labs.  Questions were answered to the patient's satisfaction.      Lonni Cash

## 2024-05-16 NOTE — H&P (View-Only) (Signed)
 "  Rounding Note   Patient Name: Gary [name] Date of Encounter: 05/16/2024  [facility] Cardiologist: Jerel Balding, MD   Subjective  Denies any CP, last episode of pleuritic chest pain was yesterday. No significant SOB.   Scheduled Meds:  amLODipine   2.5 mg Oral Daily   free water   500 mL Oral Once   simvastatin   20 mg Oral QODAY   sodium chloride  flush  3 mL Intravenous Q12H   sodium chloride  flush  3 mL Intravenous Q12H   tamsulosin   0.4 mg Oral Daily   Continuous Infusions:  sodium chloride      PRN Meds: sodium chloride , acetaminophen  **OR** acetaminophen , ondansetron  **OR** ondansetron  (ZOFRAN ) IV, senna-docusate, sodium chloride  flush   Vital Signs  Vitals:   05/15/24 2326 05/16/24 0126 05/16/24 0359 05/16/24 0550  BP: 114/69  (!) 102/43   Pulse: 64 60 61 (!) 56  Resp: 20 19 17  (!) 27  Temp: 98.1 F (36.7 C)  99 F (37.2 C)   TempSrc: Oral  Oral   SpO2: 95% 94% 92% 94%  Weight:    115 kg  Height:        Intake/Output Summary (Last 24 hours) at 05/16/2024 0802 Last data filed at 05/16/2024 0558 Gross per 24 hour  Intake 240 ml  Output 375 ml  Net -135 ml      05/16/2024    5:50 AM 05/15/2024    1:19 PM 05/15/2024    4:14 AM  Last 3 Weights  Weight (lbs) 253 lb 8.5 oz 150 lb 245 lb  Weight (kg) 115 kg 68.04 kg 111.131 kg      Telemetry Atrial flutter, no significant ventricular ectopy - Personally Reviewed  ECG  Atrial flutter with persistent ST elevation in the inferolateral leads, no contralateral ST depression.  - Personally Reviewed  Physical Exam  GEN: No acute distress.   Neck: No JVD Cardiac: RRR, no murmurs, rubs, or gallops.  Respiratory: Clear to auscultation bilaterally. GI: Soft, nontender, non-distended  MS: No edema; No deformity. Neuro:  Nonfocal  Psych: Normal affect   Labs High Sensitivity Troponin:  No results for input(s): TROPONINIHS in the last 720 hours.   Chemistry Recent Labs  Lab 05/15/24 0422  05/15/24 1322 05/16/24 0254  NA 137 139 135  K 5.5* 5.7* 4.6  CL 106 106 106  CO2 21* 20* 19*  GLUCOSE 110* 119* 129*  BUN 57* 58* 66*  CREATININE 1.70* 1.82* 2.08*  CALCIUM  8.6* 8.7* 8.1*  MG 2.0  --   --   GFRNONAA 39* 36* 30*  ANIONGAP 10 12 11     Lipids No results for input(s): CHOL, TRIG, HDL, LABVLDL, LDLCALC, CHOLHDL in the last 168 hours.  Hematology Recent Labs  Lab 05/15/24 0422 05/15/24 1322 05/16/24 0254  WBC 8.3 10.5 8.2  RBC 3.88* 3.89* 3.54*  HGB 12.5* 12.4* 11.3*  HCT 37.3* 38.2* 35.3*  MCV 96.1 98.2 99.7  MCH 32.2 31.9 31.9  MCHC 33.5 32.5 32.0  RDW 14.1 14.0 14.0  PLT 129* 141* 116*   Thyroid  No results for input(s): TSH, FREET4 in the last 168 hours.  BNP Recent Labs  Lab 05/15/24 1939  PROBNP 1,023.0*    DDimer  Recent Labs  Lab 05/15/24 1354  DDIMER 1.06*     Radiology  US  Venous Img Lower Bilateral Result Date: 05/15/2024 CLINICAL DATA:  Lower extremity edema at elevated D-dimer. EXAM: BILATERAL LOWER EXTREMITY VENOUS DOPPLER ULTRASOUND TECHNIQUE: Gray-scale sonography with graded compression, as well  as color Doppler and duplex ultrasound were performed to evaluate the lower extremity deep venous systems from the level of the common femoral vein and including the common femoral, femoral, profunda femoral, popliteal and calf veins including the posterior tibial, peroneal and gastrocnemius veins when visible. The superficial great saphenous vein was also interrogated. Spectral Doppler was utilized to evaluate flow at rest and with distal augmentation maneuvers in the common femoral, femoral and popliteal veins. COMPARISON:  None Available. FINDINGS: RIGHT LOWER EXTREMITY Common Femoral Vein: No evidence of thrombus. Normal compressibility, respiratory phasicity and response to augmentation. Saphenofemoral Junction: No evidence of thrombus. Normal compressibility and flow on color Doppler imaging. Profunda Femoral Vein: No evidence  of thrombus. Normal compressibility and flow on color Doppler imaging. Femoral Vein: No evidence of thrombus. Normal compressibility, respiratory phasicity and response to augmentation. Popliteal Vein: No evidence of thrombus. Normal compressibility, respiratory phasicity and response to augmentation. Calf Veins: No evidence of thrombus. Normal compressibility and flow on color Doppler imaging. Superficial Great Saphenous Vein: No evidence of thrombus. Normal compressibility. Venous Reflux:  None. Other Findings: No evidence of superficial thrombophlebitis or abnormal fluid collection. LEFT LOWER EXTREMITY Common Femoral Vein: No evidence of thrombus. Normal compressibility, respiratory phasicity and response to augmentation. Saphenofemoral Junction: No evidence of thrombus. Normal compressibility and flow on color Doppler imaging. Profunda Femoral Vein: No evidence of thrombus. Normal compressibility and flow on color Doppler imaging. Femoral Vein: No evidence of thrombus. Normal compressibility, respiratory phasicity and response to augmentation. Popliteal Vein: No evidence of thrombus. Normal compressibility, respiratory phasicity and response to augmentation. Calf Veins: No evidence of thrombus. Normal compressibility and flow on color Doppler imaging. Superficial Great Saphenous Vein: No evidence of thrombus. Normal compressibility. Venous Reflux:  None. Other Findings: No evidence of superficial thrombophlebitis or abnormal fluid collection. IMPRESSION: No evidence of deep venous thrombosis in either lower extremity. Electronically Signed   By: Marcey Moan M.D.   On: 05/15/2024 16:23   DG Chest 2 View Result Date: 05/15/2024 CLINICAL DATA:  Chest pain and shortness of breath. EXAM: CHEST - 2 VIEW COMPARISON:  09/07/2022 and chest CT 08/21/2023 FINDINGS: Lungs are adequately inflated demonstrate moderate size left pleural effusion likely with associated left basilar atelectasis worse compared to the  prior exam. Right lung is clear. Slightly more prominent cardiac silhouette which could represent recurrent pericardial effusion as seen on previous chest CT. Remainder the exam is unchanged. IMPRESSION: 1. Moderate size left pleural effusion likely with associated left basilar atelectasis worse compared to the prior exam. 2. Possible interval increased size of the cardiac silhouette which may represent recurrent pericardial effusion as seen on prior CT. Electronically Signed   By: Toribio Agreste M.D.   On: 05/15/2024 15:07    Cardiac Studies  Limited echo 05/08/2024 1. Left ventricular ejection fraction, by estimation, is 60 to 65%. The  left ventricle has normal function. The left ventricle has no regional  wall motion abnormalities. There is mild concentric left ventricular  hypertrophy. Left ventricular diastolic  parameters are indeterminate. There is the interventricular septum is  flattened in systole and diastole, consistent with right ventricular  pressure and volume overload.   2. Right ventricular systolic function is mildly reduced. The right  ventricular size is moderately enlarged. There is severely elevated  pulmonary artery systolic pressure. The estimated right ventricular  systolic pressure is 62.9 mmHg.   3. Left atrial size was moderately dilated.   4. Right atrial size was severely dilated.   5. There  is a very large primarily posterior pericardial effusion  measuring up to 4.6cm with smaller focall components along the RA and RV.  There is no evdicence of tamponade. Unchanged since 02/25/24. Large  pericardial effusion. The pericardial  effusion is posterior to the left ventricle. There is no evidence of  cardiac tamponade.   6. The mitral valve is myxomatous. Mild mitral valve regurgitation.   7. Tricuspid valve regurgitation is moderate.   8. The aortic valve is tricuspid. There is mild calcification of the  aortic valve. Aortic valve regurgitation is mild. Aortic  valve  sclerosis/calcification is present, without any evidence of aortic  stenosis.   9. The inferior vena cava is dilated in size with <50% respiratory  variability, suggesting right atrial pressure of 15 mmHg.   Conclusion(s)/Recommendation(s): There is a very large primarily posterior  pericardial effusion measuring up to 4.6cm with smaller focall components  along the RA and RV. There is no evdicence of tamponade. Unchanged since  02/25/24.   Patient Profile   89 y.o. male with PMH of permanent atrial flutter on Eliquis , chronic large pericardial effusion, CKD stage IIIa, HTN, HLD, BPH, rosacea, and OSA on CPAP who presented with dyspnea and hyperkalemia  Assessment & Plan   Chronic large pericardial effusion  - diagnosed in May 2024, autoimmune work up negative. Previously treated with cholchicine without improvement.   - recent echo showed EF 60-65%, PASP 62 mmHg, very large primarily posterior pericardial effusion measuring 4.6 cm, no evidence of tamponade, mild MR, moderate TR  - pending RHC today with possible pericardiocentesis. Eliquis  has been held since 1/13 in anticipation for RHC  - pleuritic chest pain resolved since yesterday, question if need another course of colchicine  after pericardiocentesis.   Pleuritic chest pain  - serial trop 29 --> 29, flat, inconsistent with ACS   Acute on chronic renal insufficiency: baseline Cr 1.4, Cr trended up to 2.08 today  Permanent atrial flutter: Eliquis  on hold since 1/13 in anticipation of RHC (last dose of Eliquis  the night of 1/13)  Hyperkalemia: treated with Lokelma   OSA on CPAP  Hypertension: continue amlodipine   Risk and benefit of RHC and pericardiocentesis discussed with the patient including significant bleeding, injury to the heart, MI, stroke, and loss of life.  Patient is agreeable to proceed.   For questions or updates, please contact [facility] Please consult [email or web address] for contact info under      Signed, Scot Ford, PA  05/16/2024, 8:02 AM     I have seen and examined the patient along with Scot Ford, PA .  I have reviewed the chart, notes and new data.  I agree with PA/NP's note.  Key new complaints: chest pain yesterday was clearly pleuritic, although not positional.  It has resolved.  He actually feels quite well.  He does not have dyspnea.  He has not had any syncope since the event 2 years ago. Key examination changes: Blood pressure is borderline low.  Elevated jugular venous pulsations, roughly 10 cm.  Diminished breath sounds left lung base.  Irregular rhythm, no audible pericardial rub or murmurs.  Trivial symmetrical retromalleolar edema. Key new findings / data: Chest x-ray shows a new left pleural effusion.  Remains in permanent atrial flutter, spontaneously rate controlled.  Worsening renal parameters suggest possible reduction in cardiac output.  Potassium now normal.  CRP is markedly elevated, where it has been normal when previously checked for evaluation of his chronic pericardial effusion.  PLAN: PAH: New development, etiology uncertain.  He has never had any manifestations of left heart failure and does not have chronic lung disease.  Previous workup for collagen vascular disease was unrevealing.  He is on chronic anticoagulation which makes thromboembolic pulmonary hypertension unlikely.  Right heart catheterization today for direct measurement of pulmonary artery pressures and to establish whether this is precapillary or postcapillary in mechanism. Right heart failure: Has clear evidence of increased right heart filling pressures and reduced cardiac output may be causing his worsening renal insufficiency. Pericardial effusion: The etiology is unclear.  A large effusion has been present for the last couple of years and has generally been asymptomatic.  He now has had transient pleuritic chest pain and has a hint of ST segment elevation on his ECG (this is actually even  more obvious on his telemetry monitor lead).  Now has elevated CRP, while in the past CRP and ESR were normal and there was no change in the pericardial effusion after treatment with colchicine .  He also has new evidence of increased intracardiac filling pressures (at least right sided: Marked JVD on exam and a plethoric inferior vena cava on echo).  If right heart catheterization shows pressure equalization and ventricular interdependence findings, he should undergo pericardiocentesis.  Maybe a sample of fluid will assist in making a diagnosis. Left pleural effusion: This is a new abnormality and might be a hydrostatic effusion due to new right heart failure or could be due to the same inflammatory process that is contributing to his pericardial effusion. A-flutter: Longstanding asymptomatic arrhythmia with spontaneous rate control.  Probably also has AV node disease and will eventually need a pacemaker if he has symptomatic bradycardia.  He had a syncopal event 2 years ago that has not recurred and we have deferred pacemaker implantation for now.  Jerel Balding, MD, [facility] [phone] 05/16/2024, 11:04 AM   "

## 2024-05-17 ENCOUNTER — Encounter (HOSPITAL_COMMUNITY): Payer: Self-pay | Admitting: Cardiovascular Disease

## 2024-05-17 DIAGNOSIS — G4733 Obstructive sleep apnea (adult) (pediatric): Secondary | ICD-10-CM

## 2024-05-17 DIAGNOSIS — E785 Hyperlipidemia, unspecified: Secondary | ICD-10-CM

## 2024-05-17 DIAGNOSIS — I4892 Unspecified atrial flutter: Secondary | ICD-10-CM

## 2024-05-17 DIAGNOSIS — N179 Acute kidney failure, unspecified: Secondary | ICD-10-CM

## 2024-05-17 DIAGNOSIS — N1831 Chronic kidney disease, stage 3a: Secondary | ICD-10-CM

## 2024-05-17 DIAGNOSIS — I130 Hypertensive heart and chronic kidney disease with heart failure and stage 1 through stage 4 chronic kidney disease, or unspecified chronic kidney disease: Secondary | ICD-10-CM

## 2024-05-17 DIAGNOSIS — I50810 Right heart failure, unspecified: Secondary | ICD-10-CM

## 2024-05-17 DIAGNOSIS — N400 Enlarged prostate without lower urinary tract symptoms: Secondary | ICD-10-CM

## 2024-05-17 DIAGNOSIS — I272 Pulmonary hypertension, unspecified: Secondary | ICD-10-CM

## 2024-05-17 LAB — CBC
HCT: 33.7 % — ABNORMAL LOW (ref 39.0–52.0)
Hemoglobin: 11.4 g/dL — ABNORMAL LOW (ref 13.0–17.0)
MCH: 32.4 pg (ref 26.0–34.0)
MCHC: 33.8 g/dL (ref 30.0–36.0)
MCV: 95.7 fL (ref 80.0–100.0)
Platelets: 127 K/uL — ABNORMAL LOW (ref 150–400)
RBC: 3.52 MIL/uL — ABNORMAL LOW (ref 4.22–5.81)
RDW: 14.1 % (ref 11.5–15.5)
WBC: 8.7 K/uL (ref 4.0–10.5)
nRBC: 0 % (ref 0.0–0.2)

## 2024-05-17 LAB — COMPREHENSIVE METABOLIC PANEL WITH GFR
ALT: 12 U/L (ref 0–44)
AST: 15 U/L (ref 15–41)
Albumin: 3.1 g/dL — ABNORMAL LOW (ref 3.5–5.0)
Alkaline Phosphatase: 65 U/L (ref 38–126)
Anion gap: 11 (ref 5–15)
BUN: 68 mg/dL — ABNORMAL HIGH (ref 8–23)
CO2: 18 mmol/L — ABNORMAL LOW (ref 22–32)
Calcium: 7.9 mg/dL — ABNORMAL LOW (ref 8.9–10.3)
Chloride: 105 mmol/L (ref 98–111)
Creatinine, Ser: 2.06 mg/dL — ABNORMAL HIGH (ref 0.61–1.24)
GFR, Estimated: 31 mL/min — ABNORMAL LOW
Glucose, Bld: 100 mg/dL — ABNORMAL HIGH (ref 70–99)
Potassium: 4.7 mmol/L (ref 3.5–5.1)
Sodium: 135 mmol/L (ref 135–145)
Total Bilirubin: 0.8 mg/dL (ref 0.0–1.2)
Total Protein: 5.4 g/dL — ABNORMAL LOW (ref 6.5–8.1)

## 2024-05-17 LAB — LACTIC ACID, PLASMA: Lactic Acid, Venous: 1.4 mmol/L (ref 0.5–1.9)

## 2024-05-17 LAB — ANTINUCLEAR ANTIBODIES, IFA: ANA Ab, IFA: NEGATIVE

## 2024-05-17 MED ORDER — ATORVASTATIN CALCIUM 10 MG PO TABS
20.0000 mg | ORAL_TABLET | Freq: Every day | ORAL | Status: DC
  Administered 2024-05-17 – 2024-05-18 (×2): 20 mg via ORAL
  Filled 2024-05-17 (×2): qty 2

## 2024-05-17 NOTE — Progress Notes (Signed)
 "  NAME:  Gary [name], MRN:  [id number], DOB:  [birth year], LOS: 2 ADMISSION DATE:  05/15/2024, CONSULTATION DATE:  1/16 REFERRING MD:  TRH, CHIEF COMPLAINT: pericardial effusion   History of Present Illness:  89 year old male with past medical history of arthritis, OSA on CPAP, hypertension, hyperlipidemia, CKD IIIA, atrial flutter on Eliquis , chronic large pericardial effusion most recent echo 05/09/23 without tamponade, who initially presented on 1/15 to ED.SABRA He had labs drawn on 1/14 showing hyperkalemia and was told to come to ED for treatment. He received treatment on 1/15 ~2am and then returned to ED later that morning for chest pain and shortness of breath. Noted that he was to have RHC and possible effusion drainage on 1/16 already scheduled. Cardiology consulted, recommended medical admit.   Labs in ED K 5.7, BUN 58, sCr 1.82, WBC 10.5, trop 29>29, BNP 1023 Pertinent  Medical History  OSA on CPAP, hypertension, hyperlipidemia, CKD IIIA, atrial flutter on Eliquis , chronic large pericardial effusion most recent echo 05/09/23 without tamponade  Significant Hospital Events: Including procedures, antibiotic start and stop dates in addition to other pertinent events   1/15: ED for hyperkalemia>dc>ED for SOB/CP>admit for RHC, pericardiocentesis  1/16: RHC Fick CI 2.88, PA 56/31, PAPi 1.9; pericardiocentesis with 1300cc removed > ICU after with pericardial drain  Interim History / Subjective:  Minimal pericardial drain output so far today. Endorses feeling much better. Pericardial fluid with numerous inflammatory cells / PMNs, but cx no growth to date and neg gram stain. Per HF service, repeat autoimmune workup/serologies sent.  Objective   Blood pressure (!) 103/50, pulse (!) 53, temperature (!) 97.1 F (36.2 C), temperature source Axillary, resp. rate 20, height 6' 3 (1.905 m), weight 115 kg, SpO2 93%.        Intake/Output Summary (Last 24 hours) at 05/17/2024 1345 Last data filed at  05/17/2024 1100 Gross per 24 hour  Intake 605 ml  Output 1070 ml  Net -465 ml   Filed Weights   05/15/24 1319 05/16/24 0550  Weight: 68 kg 115 kg    Examination: General: older male, sitting in bed, no distress  HENT: mm moist, room air, anicteric sclera  Lungs: CTAB, room air, resp even and unlabored  Cardiovascular: irregularly irregular rhythm, no edema. Pericardial drain in place. Abdomen: rounded, soft  Extremities: warm, no edema  Neuro: awake, alert, oriented, non focal  GU: no foley   Resolved Hospital Problem list    Assessment & Plan:  Chronic large pericardial effusion s/p pericardiocentesis  Has been followed for 2 years. Presented with CP/SOB. Drain 1300cc, many inflammatory cells / PMNs - cardiology management - drain until <50cc/24h period  - colchicine  - f/u pericardial fluid culture - f/u autoimmune studies: RA, anti-CCP, anti-Scl, ANCA, ANA (sent 1/16 and 1/17)  RV failure  pHTN - Diuresis - F/u serologic workup as above (? Unifying diagnosis for pericardial effusion and PHTN ?) - CTEPH unlikely given he has been on A/C over the entire period over which his RV dysfunction appears to have developed - Group III disease very unlikely based on my review of his 2016 Split Night study and PAP Titration, and similarly he has been adherent to CPAP use over the period over which RV dysfunction has developed. - Small component of Group II disease but would not be anticipated to explain degree of RV dsyfunction per HF service  AKI on CKD IIIa, worsening Hyperkalemia, resolved BUN 55>57>58>66 sCr 1.86>1.7>1.82>2.08  - diuresis per AHF - trend bmp, mag, phos -  replete elytes - strict I&O - Avoid nephrotoxic agents, renally dose medications - ensure adequate renal perfusion   Atrial flutter on Eliquis  - tele monitor  - Eliquis  on hold with pericardial drain   OSA on CPAP  - son to bring home CPAP for him to use  - likely causing his severe PAH  - CPAP  nightly  - O2 as needed   HTN HLD - simvastatin  20mg  EOD  - amlodipine  2.5mg  daily   BPH - tamsulosin  0.4mg  daily  Labs   CBC: Recent Labs  Lab 05/14/24 1413 05/15/24 0422 05/15/24 1322 05/16/24 0254 05/16/24 1221 05/17/24 0133  WBC 7.5 8.3 10.5 8.2  --  8.7  NEUTROABS  --  5.8  --   --   --   --   HGB 12.7* 12.5* 12.4* 11.3* 11.9*  10.5* 11.4*  HCT 39.9 37.3* 38.2* 35.3* 35.0*  31.0* 33.7*  MCV 101* 96.1 98.2 99.7  --  95.7  PLT 155 129* 141* 116*  --  127*    Basic Metabolic Panel: Recent Labs  Lab 05/14/24 1412 05/15/24 0422 05/15/24 1322 05/16/24 0254 05/16/24 1221 05/17/24 0133  NA 141 137 139 135 140  146* 135  K 6.3* 5.5* 5.7* 4.6 4.6  3.9 4.7  CL 107* 106 106 106  --  105  CO2 17* 21* 20* 19*  --  18*  GLUCOSE 86 110* 119* 129*  --  100*  BUN 55* 57* 58* 66*  --  68*  CREATININE 1.86* 1.70* 1.82* 2.08*  --  2.06*  CALCIUM  8.8 8.6* 8.7* 8.1*  --  7.9*  MG  --  2.0  --   --   --   --    GFR: Estimated Creatinine Clearance: 35.2 mL/min (A) (by C-G formula based on SCr of 2.06 mg/dL (H)). Recent Labs  Lab 05/15/24 0422 05/15/24 1322 05/16/24 0254 05/17/24 0133 05/17/24 0939  WBC 8.3 10.5 8.2 8.7  --   LATICACIDVEN  --   --   --   --  1.4    Liver Function Tests: Recent Labs  Lab 05/17/24 0133  AST 15  ALT 12  ALKPHOS 65  BILITOT 0.8  PROT 5.4*  ALBUMIN 3.1*   No results for input(s): LIPASE, AMYLASE in the last 168 hours. No results for input(s): AMMONIA in the last 168 hours.  ABG    Component Value Date/Time   HCO3 16.1 (L) 05/16/2024 1221   HCO3 18.4 (L) 05/16/2024 1221   TCO2 17 (L) 05/16/2024 1221   TCO2 19 (L) 05/16/2024 1221   ACIDBASEDEF 9.0 (H) 05/16/2024 1221   ACIDBASEDEF 6.0 (H) 05/16/2024 1221   O2SAT 65 05/16/2024 1221   O2SAT 65 05/16/2024 1221     Coagulation Profile: No results for input(s): INR, PROTIME in the last 168 hours.  Cardiac Enzymes: No results for input(s): CKTOTAL, CKMB,  CKMBINDEX, TROPONINI in the last 168 hours.  HbA1C: Hgb A1c MFr Bld  Date/Time Value Ref Range Status  02/26/2024 09:44 AM 6.1 4.6 - 6.5 % Final    Comment:    Glycemic Control Guidelines for People with Diabetes:Non Diabetic:  <6%Goal of Therapy: <7%Additional Action Suggested:  >8%   02/19/2023 09:09 AM 6.2 4.6 - 6.5 % Final    Comment:    Glycemic Control Guidelines for People with Diabetes:Non Diabetic:  <6%Goal of Therapy: <7%Additional Action Suggested:  >8%     CBG: No results for input(s): GLUCAP in the last 168 hours.  Review  of Systems:   As above   Past Medical History:  He,  has a past medical history of Adenomatous colon polyp (06/2000), ALLERGIC RHINITIS (10/03/2007), Allergy, Arthritis, Atrial flutter (HCC) (06/26/2014), Cancer [facility]), CARDIAC MURMUR, AORTIC (10/03/2007), Colon polyps, ERECTILE DYSFUNCTION (10/03/2007), High blood pressure (03/2016), HYPERLIPIDEMIA (10/03/2007), Rosacea, and Sleep apnea.   Surgical History:   Past Surgical History:  Procedure Laterality Date   ADENOIDECTOMY     CARDIOVERSION N/A 07/01/2014   Procedure: CARDIOVERSION;  Surgeon: Jerel Balding, MD;  Location: [facility] ENDOSCOPY;  Service: Cardiovascular;  Laterality: N/A;   COLONOSCOPY     PALATE SURGERY     PERICARDIOCENTESIS N/A 05/16/2024   Procedure: PERICARDIOCENTESIS;  Surgeon: Verlin Lonni BIRCH, MD;  Location: [facility] INVASIVE CV LAB;  Service: Cardiovascular;  Laterality: N/A;   RIGHT HEART CATH N/A 09/11/2022   Procedure: RIGHT HEART CATH;  Surgeon: Court Dorn PARAS, MD;  Location: [facility] INVASIVE CV LAB;  Service: Cardiovascular;  Laterality: N/A;   RIGHT HEART CATH N/A 05/16/2024   Procedure: RIGHT HEART CATH;  Surgeon: Verlin Lonni BIRCH, MD;  Location: [facility] INVASIVE CV LAB;  Service: Cardiovascular;  Laterality: N/A;   TEE WITHOUT CARDIOVERSION N/A 07/01/2014   Procedure: TRANSESOPHAGEAL ECHOCARDIOGRAM (TEE);  Surgeon: Jerel Balding, MD;  Location: [facility] ENDOSCOPY;  Service:  Cardiovascular;  Laterality: N/A;   TONSILLECTOMY     TOTAL KNEE ARTHROPLASTY Right 08/27/2017   Procedure: RIGHT TOTAL KNEE ARTHROPLASTY;  Surgeon: Rubie Kemps, MD;  Location: [facility] OR;  Service: Orthopedics;  Laterality: Right;     Social History:   reports that he has quit smoking. He has never used smokeless tobacco. He reports current alcohol use of about 8.0 standard drinks of alcohol per week. He reports that he does not use drugs.   Family History:  His family history includes CAD in his brother; Cancer in his father; Diabetes in his mother; Hypotension in his sister; Liver cancer in his father; Stomach cancer in his paternal aunt.   Allergies Allergies[1]   Home Medications  Prior to Admission medications  Medication Sig Start Date End Date Taking? Authorizing Provider  allopurinol  (ZYLOPRIM ) 100 MG tablet TAKE 2 TABLETS EVERY DAY 02/15/24   Burchette, Wolm ORN, MD  amLODipine  (NORVASC ) 2.5 MG tablet Take 1 tablet (2.5 mg total) by mouth daily. 02/26/24   Burchette, Wolm ORN, MD  apixaban  (ELIQUIS ) 5 MG TABS tablet Take 1 tablet (5 mg total) by mouth 2 (two) times daily. 02/26/24   Burchette, Wolm ORN, MD  co-enzyme Q-10 30 MG capsule Take 30 mg by mouth daily.    [provider]  fluticasone  (FLONASE ) 50 MCG/ACT nasal spray PLACE 1 SPRAY INTO BOTH NOSTRILS EVERY OTHER DAY. Patient taking differently: Place 1 spray into both nostrils daily as needed for allergies or rhinitis. 04/18/23   Burchette, Wolm ORN, MD  lisinopril  (ZESTRIL ) 10 MG tablet Take 1 tablet (10 mg total) by mouth daily. 05/15/24   Palumbo, April, MD  minocycline  (MINOCIN ) 100 MG capsule Take 100 mg by mouth every other day. 07/25/23   [provider]  Multiple Vitamin (MULTIVITAMIN) tablet Take 1 tablet by mouth daily.    [provider]  simvastatin  (ZOCOR ) 20 MG tablet TAKE 1 TABLET EVERY OTHER DAY 04/09/24   Burchette, Wolm ORN, MD  tamsulosin  (FLOMAX ) 0.4 MG CAPS capsule TAKE 1 CAPSULE EVERY  DAY 03/13/24   Burchette, Wolm ORN, MD     Critical care time: 63    Total critical care time spent by me: 40 minutes  Critical care time was exclusive of separately billable procedures and the treatment of any other patient.   Critical care was necessary to treat or prevent imminent or life-threatening deterioration.  Critical care was time spent personally by me on the following activities: development of treatment plan with patient and/or surrogate as well as nursing, discussions with consultants, re-evaluation of the patient's condition and their response to treatment, examination of patient, obtaining history from patient or surrogate, ordering and performing treatments and interventions, ordering and review of laboratory studies, ordering and review of radiographic studies, and participation in multidisciplinary rounds.  Lamar Dales, MD Pulmonary, Critical Care & Sleep Medicine [facility]  7a-7p: For contact information, see AMION. If no response to pager, please call PCCM 2-H APP. After 7p: Please call PCCM APP on-call for 2-H.        [1] No Known Allergies  "

## 2024-05-17 NOTE — Progress Notes (Signed)
" °   05/17/24 2017  BiPAP/CPAP/SIPAP  Reason BIPAP/CPAP not in use Non-compliant (Pt states he does not wish to wear CPAP tonight. Pt states he will let the RN know if he changes his mind)    "

## 2024-05-17 NOTE — Plan of Care (Signed)

## 2024-05-17 NOTE — Progress Notes (Signed)
 "    Advanced Heart Failure Rounding Note  Cardiologist: Jerel Balding, MD  Chief Complaint: Chronic large pericardial effusion Patient Profile   Gary [name] is a 89 y.o. male with permanent atrial flutter on Eliquis , chronic large pericardial effusion, CKD stage IIIa, hypertension, hyperlipidemia, gout, BPH and OSA on CPAP.  Significant events:   05/15/24: Admitted with chronic large pericardial effusion.  Echo 05/08/2024 with evidence of RV pressure and volume overload, severely elevated PA systolic pressure, no evidence of tamponade.  NSTEMI pleuritic chest discomfort.  Admitted for pericardiocentesis. 05/16/2024: Underwent successful pericardiocentesis, 1300 mL removed, pericardial drain in place.  RHC: RA 13, PA 56/31 (31), PCWP 16, PAPi 1.9, Fick CO/CI 5.6/2.3 PVR 2.7 WU (numbers recalculated as cath lab used wrong weight)  Subjective:    Had 140cc out of drain overnight. Has slowed this am but drain was clogged (I unclogged)   Denies CP or SOB. Wants to go home.   Objective:   Weight Range: 115 kg Body mass index is 31.69 kg/m.   Vital Signs:   Temp:  [98.4 F (36.9 C)-99.2 F (37.3 C)] 98.7 F (37.1 C) (01/17 0330) Pulse Rate:  [0-101] 55 (01/17 1000) Resp:  [17-28] 22 (01/17 1000) BP: (99-159)/(50-103) 106/50 (01/17 1000) SpO2:  [84 %-98 %] 93 % (01/17 1000) Last BM Date : 05/17/24  Weight change: Filed Weights   05/15/24 1319 05/16/24 0550  Weight: 68 kg 115 kg    Intake/Output:   Intake/Output Summary (Last 24 hours) at 05/17/2024 1046 Last data filed at 05/17/2024 0800 Gross per 24 hour  Intake 600 ml  Output 1305 ml  Net -705 ml     Physical Exam   General:  Sitting up in bed. No resp difficulty HEENT: normal Neck: supple. no JVD.  Cor: Irregular rate & rhythm. No rubs, gallops or murmurs. + drain Lungs: clear Abdomen: soft, nontender, nondistended.Good bowel sounds. Extremities: no cyanosis, clubbing, rash, edema Neuro: alert & orientedx3,  cranial nerves grossly intact. moves all 4 extremities w/o difficulty. Affect pleasant   Telemetry   A-fib 50-60s (Personally reviewed)    Labs   CBC Recent Labs    05/15/24 0422 05/15/24 1322 05/16/24 0254 05/16/24 1221 05/17/24 0133  WBC 8.3   < > 8.2  --  8.7  NEUTROABS 5.8  --   --   --   --   HGB 12.5*   < > 11.3* 11.9*  10.5* 11.4*  HCT 37.3*   < > 35.3* 35.0*  31.0* 33.7*  MCV 96.1   < > 99.7  --  95.7  PLT 129*   < > 116*  --  127*   < > = values in this interval not displayed.   Basic Metabolic Panel Recent Labs    98/84/73 0422 05/15/24 1322 05/16/24 0254 05/16/24 1221 05/17/24 0133  NA 137   < > 135 140  146* 135  K 5.5*   < > 4.6 4.6  3.9 4.7  CL 106   < > 106  --  105  CO2 21*   < > 19*  --  18*  GLUCOSE 110*   < > 129*  --  100*  BUN 57*   < > 66*  --  68*  CREATININE 1.70*   < > 2.08*  --  2.06*  CALCIUM  8.6*   < > 8.1*  --  7.9*  MG 2.0  --   --   --   --    < > =  values in this interval not displayed.   Liver Function Tests Recent Labs    05/17/24 0133  AST 15  ALT 12  ALKPHOS 65  BILITOT 0.8  PROT 5.4*  ALBUMIN 3.1*   No results for input(s): LIPASE, AMYLASE in the last 72 hours. Cardiac Enzymes No results for input(s): CKTOTAL, CKMB, CKMBINDEX, TROPONINI in the last 72 hours.  BNP: BNP (last 3 results) No results for input(s): BNP in the last 8760 hours.  ProBNP (last 3 results) Recent Labs    05/15/24 1939  PROBNP 1,023.0*     D-Dimer Recent Labs    05/15/24 1354  DDIMER 1.06*   Hemoglobin A1C No results for input(s): HGBA1C in the last 72 hours. Fasting Lipid Panel No results for input(s): CHOL, HDL, LDLCALC, TRIG, CHOLHDL, LDLDIRECT in the last 72 hours. Medications:   Scheduled Medications:  amLODipine   2.5 mg Oral Daily   atorvastatin   20 mg Oral Daily   Chlorhexidine  Gluconate Cloth  6 each Topical Daily   colchicine   0.6 mg Oral Daily   sodium chloride  flush  3 mL  Intravenous Q12H   sodium chloride  flush  3 mL Intravenous Q12H   tamsulosin   0.4 mg Oral Daily    Infusions:  sodium chloride       PRN Medications: sodium chloride , acetaminophen  **OR** acetaminophen , ondansetron  **OR** ondansetron  (ZOFRAN ) IV, senna-docusate, sodium chloride  flush  Assessment/Plan   Chronic large pericardial effusion - S/p pericardiocentesis 05/16/2024.  -1300 mL. - Pericardial drain in place - still with output. Will leave in today - Etiology of pericardial effusion remains unclear. Initial fluid analysis suggests acute inflammation. Denies recent URI. Previous CTD serologies negative - have resent. Symptoms suggestive of acute pericarditis.  - Continue colchicine  0.6 daily (renally dosed) - Repeat limited echo tomorrow - Await cytology.  - Concern for underlying systemic inflammatory process. Check LDH   Pulmonary hypertension with RV failure - RHC: RA 13, PA 56/31 (31), PCWP 16, PAPi 1.9, Fick CO/CI 5.6/2.3 PVR 2.7 WU - Appears euvolemic on exam.  Not on diuretics at home, would not need aggressive diuresis with RV failure. - He has mild PAH But likely underestimated given degree of RV dysfunction. Suspect related to OSA but this has been well treated.Will need to ensure that his dowloads show adequate oxygenation at night.  Pulmonary pressures were elevated on echo back in 5/24 but normal in 2016. Will repeat basic PH serology. CTEPH unlikely in face of chronic AC.Consider trial of sildenafil . Consider cMRI.   Permanent atrial flutter - Currently holding Eliquis  - Rate controlled on telemetry - Known history of OSA, on CPAP  AKI on CKD stage IIIa - SCr baseline around 1.6 - Remains 2.1 today - Bicarb 19 -> lactic acid ok   Hypertension - BP elevated, starting home meds  OSA on CPAP  HLD - Continue statin - LDL 90 10/25   CRITICAL CARE Performed by: Toribio Fuel   Total critical care time: 35  minutes  Critical care time was exclusive of  separately billable procedures and treating other patients.  Critical care was necessary to treat or prevent imminent or life-threatening deterioration.  Critical care was time spent personally by me on the following activities: development of treatment plan with patient and/or surrogate as well as nursing, discussions with consultants, evaluation of patient's response to treatment, examination of patient, obtaining history from patient or surrogate, ordering and performing treatments and interventions, ordering and review of laboratory studies, ordering and review of radiographic studies, pulse oximetry  and re-evaluation of patient's condition.   Length of Stay: 2  Toribio Fuel, MD  05/17/2024, 10:46 AM  Advanced Heart Failure Team Pager [phone] (M-F; 7a - 5p)   Please visit [email or web address]: For overnight coverage please call cardiology fellow first. If fellow not available call Shock/ECMO MD on call.  For ECMO / Mechanical Support (Impella, IABP, LVAD) issues call Shock / ECMO MD on call.      "

## 2024-05-18 ENCOUNTER — Other Ambulatory Visit: Payer: Self-pay | Admitting: Physician Assistant

## 2024-05-18 DIAGNOSIS — I3139 Other pericardial effusion (noninflammatory): Secondary | ICD-10-CM

## 2024-05-18 DIAGNOSIS — I272 Pulmonary hypertension, unspecified: Secondary | ICD-10-CM

## 2024-05-18 LAB — LD, BODY FLUID (OTHER): LD, Body Fluid: 372 IU/L

## 2024-05-18 LAB — CBC
HCT: 33.3 % — ABNORMAL LOW (ref 39.0–52.0)
Hemoglobin: 11.1 g/dL — ABNORMAL LOW (ref 13.0–17.0)
MCH: 32.4 pg (ref 26.0–34.0)
MCHC: 33.3 g/dL (ref 30.0–36.0)
MCV: 97.1 fL (ref 80.0–100.0)
Platelets: 136 K/uL — ABNORMAL LOW (ref 150–400)
RBC: 3.43 MIL/uL — ABNORMAL LOW (ref 4.22–5.81)
RDW: 14 % (ref 11.5–15.5)
WBC: 7.4 K/uL (ref 4.0–10.5)
nRBC: 0 % (ref 0.0–0.2)

## 2024-05-18 LAB — BASIC METABOLIC PANEL WITH GFR
Anion gap: 12 (ref 5–15)
BUN: 68 mg/dL — ABNORMAL HIGH (ref 8–23)
CO2: 18 mmol/L — ABNORMAL LOW (ref 22–32)
Calcium: 8 mg/dL — ABNORMAL LOW (ref 8.9–10.3)
Chloride: 106 mmol/L (ref 98–111)
Creatinine, Ser: 2.02 mg/dL — ABNORMAL HIGH (ref 0.61–1.24)
GFR, Estimated: 32 mL/min — ABNORMAL LOW
Glucose, Bld: 98 mg/dL (ref 70–99)
Potassium: 4.4 mmol/L (ref 3.5–5.1)
Sodium: 135 mmol/L (ref 135–145)

## 2024-05-18 LAB — PROTEIN, BODY FLUID (OTHER): Total Protein, Body Fluid Other: 3.9 g/dL

## 2024-05-18 LAB — GLUCOSE, BODY FLUID OTHER: Glucose, Body Fluid Other: 59 mg/dL

## 2024-05-18 LAB — CYCLIC CITRUL PEPTIDE ANTIBODY, IGG/IGA: CCP Antibodies IgG/IgA: 9 U (ref 0–19)

## 2024-05-18 LAB — LACTATE DEHYDROGENASE: LDH: 137 U/L (ref 105–235)

## 2024-05-18 MED ORDER — APIXABAN 2.5 MG PO TABS: 2.5000 mg | ORAL_TABLET | Freq: Two times a day (BID) | ORAL | Status: DC

## 2024-05-18 MED ORDER — SILDENAFIL CITRATE 20 MG PO TABS
20.0000 mg | ORAL_TABLET | Freq: Three times a day (TID) | ORAL | Status: DC
  Administered 2024-05-18 (×2): 20 mg via ORAL
  Filled 2024-05-18 (×3): qty 1

## 2024-05-18 MED ORDER — SILDENAFIL CITRATE 20 MG PO TABS: 20.0000 mg | ORAL_TABLET | Freq: Three times a day (TID) | ORAL | 0 refills | Status: AC

## 2024-05-18 MED ORDER — COLCHICINE 0.6 MG PO TABS: 0.6000 mg | ORAL_TABLET | Freq: Every day | ORAL | 0 refills | Status: AC

## 2024-05-18 MED ORDER — APIXABAN 2.5 MG PO TABS: 2.5000 mg | ORAL_TABLET | Freq: Two times a day (BID) | ORAL | 1 refills | Status: AC

## 2024-05-18 MED ORDER — APIXABAN 5 MG PO TABS
5.0000 mg | ORAL_TABLET | Freq: Two times a day (BID) | ORAL | Status: DC
  Administered 2024-05-18: 5 mg via ORAL
  Filled 2024-05-18: qty 1

## 2024-05-18 NOTE — Progress Notes (Signed)
 "    Advanced Heart Failure Rounding Note  Cardiologist: Jerel Balding, MD  Chief Complaint: Chronic large pericardial effusion Patient Profile   Gary [name] is a 88 y.o. male with permanent atrial flutter on Eliquis , chronic large pericardial effusion, CKD stage IIIa, hypertension, hyperlipidemia, gout, BPH and OSA on CPAP.  Significant events:   05/15/24: Admitted with chronic large pericardial effusion.  Echo 05/08/2024 with evidence of RV pressure and volume overload, severely elevated PA systolic pressure, no evidence of tamponade.  NSTEMI pleuritic chest discomfort.  Admitted for pericardiocentesis. 05/16/2024: Underwent successful pericardiocentesis, 1300 mL removed, pericardial drain in place.  RHC: RA 13, PA 56/31 (31), PCWP 16, PAPi 1.9, Fick CO/CI 5.6/2.3 PVR 2.7 WU (numbers recalculated as cath lab used wrong weight)  Subjective:    < 10cc of pericardial drainage overnight.  Feels good. Wants to go home  I did POCUS echo small to moderate posterior effusion   ANA, CCP and LDH normal.  ANCA pending  Objective:   Weight Range: 114.1 kg Body mass index is 31.44 kg/m.   Vital Signs:   Temp:  [97.7 F (36.5 C)-98.3 F (36.8 C)] 97.7 F (36.5 C) (01/18 0700) Pulse Rate:  [45-184] 58 (01/18 1000) Resp:  [15-26] 23 (01/18 1000) BP: (84-143)/(50-87) 111/87 (01/18 1000) SpO2:  [74 %-100 %] 96 % (01/18 1000) Weight:  [114.1 kg] 114.1 kg (01/18 0500) Last BM Date : 05/17/24  Weight change: Filed Weights   05/15/24 1319 05/16/24 0550 05/18/24 0500  Weight: 68 kg 115 kg 114.1 kg    Intake/Output:   Intake/Output Summary (Last 24 hours) at 05/18/2024 1114 Last data filed at 05/18/2024 0620 Gross per 24 hour  Intake --  Output 1120 ml  Net -1120 ml     Physical Exam   General:  Sitting up in bed. No resp difficulty HEENT: normal Neck: supple. no JVD.  Cor: Irregular rate & rhythm. No rubs, gallops or murmurs. Lungs: clear Abdomen: soft, nontender,  nondistended.Good bowel sounds. Extremities: no cyanosis, clubbing, rash, edema Neuro: alert & orientedx3, cranial nerves grossly intact. moves all 4 extremities w/o difficulty. Affect pleasant    Telemetry   A-fib 50-60s (Personally reviewed)    Labs   CBC Recent Labs    05/17/24 0133 05/18/24 0416  WBC 8.7 7.4  HGB 11.4* 11.1*  HCT 33.7* 33.3*  MCV 95.7 97.1  PLT 127* 136*   Basic Metabolic Panel Recent Labs    98/82/73 0133 05/18/24 0416  NA 135 135  K 4.7 4.4  CL 105 106  CO2 18* 18*  GLUCOSE 100* 98  BUN 68* 68*  CREATININE 2.06* 2.02*  CALCIUM  7.9* 8.0*   Liver Function Tests Recent Labs    05/17/24 0133  AST 15  ALT 12  ALKPHOS 65  BILITOT 0.8  PROT 5.4*  ALBUMIN 3.1*   No results for input(s): LIPASE, AMYLASE in the last 72 hours. Cardiac Enzymes No results for input(s): CKTOTAL, CKMB, CKMBINDEX, TROPONINI in the last 72 hours.  BNP: BNP (last 3 results) No results for input(s): BNP in the last 8760 hours.  ProBNP (last 3 results) Recent Labs    05/15/24 1939  PROBNP 1,023.0*     D-Dimer Recent Labs    05/15/24 1354  DDIMER 1.06*   Hemoglobin A1C No results for input(s): HGBA1C in the last 72 hours. Fasting Lipid Panel No results for input(s): CHOL, HDL, LDLCALC, TRIG, CHOLHDL, LDLDIRECT in the last 72 hours. Medications:   Scheduled Medications:  amLODipine   2.5 mg Oral Daily   atorvastatin   20 mg Oral Daily   Chlorhexidine  Gluconate Cloth  6 each Topical Daily   colchicine   0.6 mg Oral Daily   sildenafil   20 mg Oral TID   sodium chloride  flush  3 mL Intravenous Q12H   sodium chloride  flush  3 mL Intravenous Q12H   tamsulosin   0.4 mg Oral Daily    Infusions:    PRN Medications: acetaminophen  **OR** acetaminophen , ondansetron  **OR** ondansetron  (ZOFRAN ) IV, senna-docusate, sodium chloride  flush  Assessment/Plan   Chronic large pericardial effusion with acute pericarditis - S/p  pericardiocentesis 05/16/2024.  -1300 mL. - Pericardial drain in place - still with output. Will leave in today - Etiology of pericardial effusion remains unclear. Initial fluid analysis suggests acute inflammation. Denies recent URI. Previous CTD serologies negative - have resent. Symptoms suggestive of acute pericarditis.  - Continue colchicine  0.6 daily (renally dosed) - Await cytology.  - I did POCUS echo small to moderate posterior effusion  - Concern for underlying systemic inflammatory process. ANA, CCP and LDH normal.  ANCA pending - He is eager to go home today. Will pull drain and I think he can go home later today with outpatient echo in 2-4 weeks - Would consider cMRI   Pulmonary hypertension with RV failure - RHC: RA 13, PA 56/31 (31), PCWP 16, PAPi 1.9, Fick CO/CI 5.6/2.3 PVR 2.7 WU - Appears euvolemic on exam.  Not on diuretics at home, would not need aggressive diuresis with RV failure. - He has mild PAH But likely underestimated given degree of RV dysfunction. Suspect related to OSA but this has been well treated.Will need to ensure that his dowloads show adequate oxygenation at night.  Pulmonary pressures were elevated on echo back in 5/24 but normal in 2016. Will repeat basic PH serology. CTEPH unlikely in face of chronic AC. - trial of sildenafil  20 tis  Permanent atrial flutter - Resume Eliquis  - Rate controlled on telemetry - Known history of OSA, on CPAP  AKI on CKD stage IIIa - SCr baseline around 1.6 - 2.1 -> 2.0 today - Bicarb 19 -> lactic acid ok   Hypertension - BP controlled on home meds  OSA on CPAP  HLD - Continue statin - LDL 90 10/25   Length of Stay: 3  Toribio Fuel, MD  05/18/2024, 11:14 AM  Advanced Heart Failure Team Pager [phone] (M-F; 7a - 5p)   Please visit [email or web address]: For overnight coverage please call cardiology fellow first. If fellow not available call Shock/ECMO MD on call.  For ECMO / Mechanical Support (Impella, IABP,  LVAD) issues call Shock / ECMO MD on call.      "

## 2024-05-18 NOTE — Progress Notes (Addendum)
 "  NAME:  Gary [name], MRN:  [id number], DOB:  Oct 17, 1935, LOS: 3 ADMISSION DATE:  05/15/2024, CONSULTATION DATE:  1/16 REFERRING MD:  TRH, CHIEF COMPLAINT: pericardial effusion   History of Present Illness:  89 year old male with past medical history of arthritis, OSA on CPAP, hypertension, hyperlipidemia, CKD IIIA, atrial flutter on Eliquis , chronic large pericardial effusion most recent echo 05/09/23 without tamponade, who initially presented on 1/15 to ED.SABRA He had labs drawn on 1/14 showing hyperkalemia and was told to come to ED for treatment. He received treatment on 1/15 ~2am and then returned to ED later that morning for chest pain and shortness of breath. Noted that he was to have RHC and possible effusion drainage on 1/16 already scheduled. Cardiology consulted, recommended medical admit.   Labs in ED K 5.7, BUN 58, sCr 1.82, WBC 10.5, trop 29>29, BNP 1023 Pertinent  Medical History  OSA on CPAP, hypertension, hyperlipidemia, CKD IIIA, atrial flutter on Eliquis , chronic large pericardial effusion most recent echo 05/09/23 without tamponade  Significant Hospital Events: Including procedures, antibiotic start and stop dates in addition to other pertinent events   1/15: ED for hyperkalemia>dc>ED for SOB/CP>admit for RHC, pericardiocentesis  1/16: RHC Fick CI 2.88, PA 56/31, PAPi 1.9; pericardiocentesis with 1300cc removed > ICU after with pericardial drain 1/17: 40 mL pericardial drain output  Interim History / Subjective:  Minimal pericardial drain output in past 24 hrs. Patient wants to go home ASAP. Anti-CCP Ab negative. Other serologies still pending.  Objective   Blood pressure 133/69, pulse (!) 58, temperature 97.6 F (36.4 C), temperature source Axillary, resp. rate 18, height 6' 3 (1.905 m), weight 114.1 kg, SpO2 95%.        Intake/Output Summary (Last 24 hours) at 05/18/2024 1213 Last data filed at 05/18/2024 0900 Gross per 24 hour  Intake 120 ml  Output 1120 ml  Net  -1000 ml   Filed Weights   05/15/24 1319 05/16/24 0550 05/18/24 0500  Weight: 68 kg 115 kg 114.1 kg    Examination: General: older male, sitting in bed, no distress  HENT: mm moist, room air, anicteric sclera  Lungs: CTAB, room air, resp even and unlabored  Cardiovascular: irregularly irregular rhythm, no edema. Pericardial drain in place. Abdomen: rounded, soft  Extremities: warm, no edema  Neuro: awake, alert, oriented, non focal  GU: no foley   Resolved Hospital Problem list    Assessment & Plan:  Chronic large pericardial effusion s/p pericardiocentesis  Has been followed for 2 years. Presented with CP/SOB. Drain 1300cc, many inflammatory cells / PMNs. Anti-CCP negative. - cardiology management - anticipate drain can be removed today - colchicine  - f/u pericardial fluid culture - f/u autoimmune studies: RA, anti-Scl, ANCA, ANA (sent 1/16 and 1/17)  RV failure  pHTN - Diuresis - F/u serologic workup as above (? Unifying diagnosis for pericardial effusion and PHTN ?) - CTEPH unlikely given he has been on A/C over the entire period over which his RV dysfunction appears to have developed - Group III disease very unlikely based on my review of his 2016 Split Night study and PAP Titration, and similarly he has been adherent to CPAP use over the period over which RV dysfunction has developed. - Small component of Group II disease but would not be anticipated to explain degree of RV dsyfunction per HF service  AKI on CKD IIIa AKI without evidence of ATN - in setting of pericardial effusion now s/p drainage - has reached plateau at ~2.0-2.1 - replete  elytes - strict I&O - Avoid nephrotoxic agents, renally dose medications - ensure adequate renal perfusion   Atrial flutter on Eliquis  - Eliquis  on hold until pericardial drain removed  OSA on CPAP  - likely causing his severe PAH  - CPAP nightly  - O2 as needed   HTN HLD - simvastatin  20mg  EOD  - amlodipine  2.5mg  daily    BPH - tamsulosin  0.4mg  daily  Labs   CBC: Recent Labs  Lab 05/15/24 0422 05/15/24 1322 05/16/24 0254 05/16/24 1221 05/17/24 0133 05/18/24 0416  WBC 8.3 10.5 8.2  --  8.7 7.4  NEUTROABS 5.8  --   --   --   --   --   HGB 12.5* 12.4* 11.3* 11.9*  10.5* 11.4* 11.1*  HCT 37.3* 38.2* 35.3* 35.0*  31.0* 33.7* 33.3*  MCV 96.1 98.2 99.7  --  95.7 97.1  PLT 129* 141* 116*  --  127* 136*    Basic Metabolic Panel: Recent Labs  Lab 05/15/24 0422 05/15/24 1322 05/16/24 0254 05/16/24 1221 05/17/24 0133 05/18/24 0416  NA 137 139 135 140  146* 135 135  K 5.5* 5.7* 4.6 4.6  3.9 4.7 4.4  CL 106 106 106  --  105 106  CO2 21* 20* 19*  --  18* 18*  GLUCOSE 110* 119* 129*  --  100* 98  BUN 57* 58* 66*  --  68* 68*  CREATININE 1.70* 1.82* 2.08*  --  2.06* 2.02*  CALCIUM  8.6* 8.7* 8.1*  --  7.9* 8.0*  MG 2.0  --   --   --   --   --    GFR: Estimated Creatinine Clearance: 35.8 mL/min (A) (by C-G formula based on SCr of 2.02 mg/dL (H)). Recent Labs  Lab 05/15/24 1322 05/16/24 0254 05/17/24 0133 05/17/24 0939 05/18/24 0416  WBC 10.5 8.2 8.7  --  7.4  LATICACIDVEN  --   --   --  1.4  --     Liver Function Tests: Recent Labs  Lab 05/17/24 0133  AST 15  ALT 12  ALKPHOS 65  BILITOT 0.8  PROT 5.4*  ALBUMIN 3.1*   No results for input(s): LIPASE, AMYLASE in the last 168 hours. No results for input(s): AMMONIA in the last 168 hours.  ABG    Component Value Date/Time   HCO3 16.1 (L) 05/16/2024 1221   HCO3 18.4 (L) 05/16/2024 1221   TCO2 17 (L) 05/16/2024 1221   TCO2 19 (L) 05/16/2024 1221   ACIDBASEDEF 9.0 (H) 05/16/2024 1221   ACIDBASEDEF 6.0 (H) 05/16/2024 1221   O2SAT 65 05/16/2024 1221   O2SAT 65 05/16/2024 1221     Coagulation Profile: No results for input(s): INR, PROTIME in the last 168 hours.  Cardiac Enzymes: No results for input(s): CKTOTAL, CKMB, CKMBINDEX, TROPONINI in the last 168 hours.  HbA1C: Hgb A1c MFr Bld  Date/Time Value  Ref Range Status  02/26/2024 09:44 AM 6.1 4.6 - 6.5 % Final    Comment:    Glycemic Control Guidelines for People with Diabetes:Non Diabetic:  <6%Goal of Therapy: <7%Additional Action Suggested:  >8%   02/19/2023 09:09 AM 6.2 4.6 - 6.5 % Final    Comment:    Glycemic Control Guidelines for People with Diabetes:Non Diabetic:  <6%Goal of Therapy: <7%Additional Action Suggested:  >8%     CBG: No results for input(s): GLUCAP in the last 168 hours.  Review of Systems:   As above   Past Medical History:  He,  has a  past medical history of Adenomatous colon polyp (06/2000), ALLERGIC RHINITIS (10/03/2007), Allergy, Arthritis, Atrial flutter (HCC) (06/26/2014), Cancer [facility]), CARDIAC MURMUR, AORTIC (10/03/2007), Colon polyps, ERECTILE DYSFUNCTION (10/03/2007), High blood pressure (03/2016), HYPERLIPIDEMIA (10/03/2007), Rosacea, and Sleep apnea.   Surgical History:   Past Surgical History:  Procedure Laterality Date   ADENOIDECTOMY     CARDIOVERSION N/A 07/01/2014   Procedure: CARDIOVERSION;  Surgeon: Jerel Balding, MD;  Location: [facility] ENDOSCOPY;  Service: Cardiovascular;  Laterality: N/A;   COLONOSCOPY     PALATE SURGERY     PERICARDIOCENTESIS N/A 05/16/2024   Procedure: PERICARDIOCENTESIS;  Surgeon: Verlin Lonni BIRCH, MD;  Location: [facility] INVASIVE CV LAB;  Service: Cardiovascular;  Laterality: N/A;   RIGHT HEART CATH N/A 09/11/2022   Procedure: RIGHT HEART CATH;  Surgeon: Court Dorn PARAS, MD;  Location: [facility] INVASIVE CV LAB;  Service: Cardiovascular;  Laterality: N/A;   RIGHT HEART CATH N/A 05/16/2024   Procedure: RIGHT HEART CATH;  Surgeon: Verlin Lonni BIRCH, MD;  Location: [facility] INVASIVE CV LAB;  Service: Cardiovascular;  Laterality: N/A;   TEE WITHOUT CARDIOVERSION N/A 07/01/2014   Procedure: TRANSESOPHAGEAL ECHOCARDIOGRAM (TEE);  Surgeon: Jerel Balding, MD;  Location: [facility] ENDOSCOPY;  Service: Cardiovascular;  Laterality: N/A;   TONSILLECTOMY     TOTAL KNEE ARTHROPLASTY Right 08/27/2017    Procedure: RIGHT TOTAL KNEE ARTHROPLASTY;  Surgeon: Rubie Kemps, MD;  Location: [facility] OR;  Service: Orthopedics;  Laterality: Right;     Social History:   reports that he has quit smoking. He has never used smokeless tobacco. He reports current alcohol use of about 8.0 standard drinks of alcohol per week. He reports that he does not use drugs.   Family History:  His family history includes CAD in his brother; Cancer in his father; Diabetes in his mother; Hypotension in his sister; Liver cancer in his father; Stomach cancer in his paternal aunt.   Allergies Allergies[1]   Home Medications  Prior to Admission medications  Medication Sig Start Date End Date Taking? Authorizing Provider  allopurinol  (ZYLOPRIM ) 100 MG tablet TAKE 2 TABLETS EVERY DAY 02/15/24   Burchette, Wolm ORN, MD  amLODipine  (NORVASC ) 2.5 MG tablet Take 1 tablet (2.5 mg total) by mouth daily. 02/26/24   Burchette, Wolm ORN, MD  apixaban  (ELIQUIS ) 5 MG TABS tablet Take 1 tablet (5 mg total) by mouth 2 (two) times daily. 02/26/24   Burchette, Wolm ORN, MD  co-enzyme Q-10 30 MG capsule Take 30 mg by mouth daily.    [provider]  fluticasone  (FLONASE ) 50 MCG/ACT nasal spray PLACE 1 SPRAY INTO BOTH NOSTRILS EVERY OTHER DAY. Patient taking differently: Place 1 spray into both nostrils daily as needed for allergies or rhinitis. 04/18/23   Burchette, Wolm ORN, MD  lisinopril  (ZESTRIL ) 10 MG tablet Take 1 tablet (10 mg total) by mouth daily. 05/15/24   Palumbo, April, MD  minocycline  (MINOCIN ) 100 MG capsule Take 100 mg by mouth every other day. 07/25/23   [provider]  Multiple Vitamin (MULTIVITAMIN) tablet Take 1 tablet by mouth daily.    [provider]  simvastatin  (ZOCOR ) 20 MG tablet TAKE 1 TABLET EVERY OTHER DAY 04/09/24   Burchette, Wolm ORN, MD  tamsulosin  (FLOMAX ) 0.4 MG CAPS capsule TAKE 1 CAPSULE EVERY DAY 03/13/24   Burchette, Wolm ORN, MD     MDM Level: High  Lamar PARAS Dales, MD       [1] No Known Allergies  "

## 2024-05-18 NOTE — Discharge Instructions (Addendum)
 You were admitted to the hospital for management of the fluid build up around your heart (called a pericardial effusion). This fluid was drained while you were hospitalized.  Post-discharge instructions:  - You should restart your apixaban  (Eliquis ) as of TOMORROW MORNING (Monday 05/19/2024). Please note that your dose of Eliquis  was reduced to 2.5 mg twice a day.  - Do NOT resume your lisinopril  until instructed to do so by cardiology or your other outpatient physicians.  - Cardiology will be arranging for a follow up appointment and repeat echocardiogram (ultrasound of your heart) after discharge. They will inform you about this appointment separately.

## 2024-05-18 NOTE — Discharge Summary (Signed)
 Physician Discharge Summary  Patient ID: Gary [name] MRN: [id number] DOB/AGE: 87/29/1937 89 y.o.  Admit date: 05/15/2024 Discharge date: 05/18/2024  Admission Diagnoses: Large pericardial effusion  Discharge Diagnoses:  Principal Problem:   Pericardial effusion Active Problems:   Hypercholesterolemia   BPH associated with nocturia   Typical atrial flutter (HCC)   Obstructive sleep apnea   Essential hypertension   Gout   Thrombocytopenia   Acute kidney injury superimposed on chronic kidney disease   Hyperkalemia   Pulmonary hypertension, unspecified (HCC)    Discharged Condition: good  Hospital Course: 74 M with history of CKD Stage IIIa, hypertension, atrial flutter on Eliquis , and OSA on CPAP who was admitted to the hospital on 1/15 in setting of dyspnea and a chronic, large pericardial effusion.   He subsequently underwent periocardiocentesis w/ placement of pericardial drain on 1/16. 1300 mL of pericardial fluid was removed. RHC showed RA 13, PA 56/31 (31), PCWP 16, PAPi 1.9, Fick CO/CI 5.6/2.3, PVR 2.7 WU.   The pericardial fluid analysis showed evidence of an inflammatory process, with high PMN count but negative gram stain and NGTD x2 days. Cytology is still in process. He was treated with colchicine  0.6 mg daily for acute pericarditis. He was also started on sildenafil  20 mg three times daily for pulmonary artery hypertension with right ventricular dysfunction. The cause of his pulmonary artery hypertension is not fully elucidated at this time. Anti-CCP and ANA were negative. RF and ANCA are pending.  Pericardial drain output was monitored daily until < 50 mL/24 hrs (indeed, drain output was just 10 mL or less for 1/17), at which point the pericardial drain was removed on 1/18. Point-of-care ultrasound performed by Dr. Cherrie (Advanced Heart Failure service) on 1/18 showed a small to moderate posterior pericardial effusion. Patient wished to be discharged home if  possible, so he was discharged with a plan for outpatient echocardiogram and cardiology follow up in 2-4 weeks.  In the setting of the patient having a mild AKI on his baseline CKD Stage IIIb, with a creatinine ~ 2.0-2.1 during this admission, his lisinopril  was held during admission and at discharge. For the same reason, his apixaban  dose was reduced to 2.5 mg twice daily at time of discharge.    Consults: cardiology  Significant Diagnostic Studies:   Microbiology: negative gram stain and NGTD for pericardial fluid  Radiology: RHC showed RA 13, PA 56/31 (31), PCWP 16, PAPi 1.9, Fick CO/CI 5.6/2.3, PVR 2.7 WU.   Point-of-care ultrasound performed by Dr. Cherrie (Advanced Heart Failure service) on 1/18 prior to discharge showed only a small to moderate posterior pericardial effusion.  Treatments: pericardiocentesis with insertion of pericardial drain (05/16/2024); subsequent drain removal on 05/18/2024.  Discharge Exam: Blood pressure 132/68, pulse 61, temperature 97.6 F (36.4 C), temperature source Axillary, resp. rate (!) 21, height 6' 3 (1.905 m), weight 114.1 kg, SpO2 95%. General: elderly male, lying in bed, no distress, alert & oriented x3, pleasant and cooperative HENT: mmm, room air, anicteric sclera  Lungs: Respirations even and unlabored  Cardiovascular: irregularly irregular rhythm, no edema. Pericardial drain in place. Abdomen: rounded, soft  Extremities: warm, no edema   Disposition: Discharge disposition: 01-Home or Self Care     Home   Discharge Instructions     (HEART FAILURE PATIENTS) Call MD:  Anytime you have any of the following symptoms: 1) 3 pound weight gain in 24 hours or 5 pounds in 1 week 2) shortness of breath, with or without a dry hacking cough  3) swelling in the hands, feet or stomach 4) if you have to sleep on extra pillows at night in order to breathe.   Complete by: As directed    Call MD for:  difficulty breathing, headache or visual  disturbances   Complete by: As directed    Call MD for:  persistant dizziness or light-headedness   Complete by: As directed    Call MD for:  redness, tenderness, or signs of infection (pain, swelling, redness, odor or green/yellow discharge around incision site)   Complete by: As directed    Call MD for:  temperature >100.4   Complete by: As directed    Diet - low sodium heart healthy   Complete by: As directed    Discharge instructions   Complete by: As directed    Please note the changes (additions and modifications) to your medication list. Specifically, these changes related to the use of 1) apixaban /Eliquis , 2) lisinopril , 3) colchicine , 4) sildenafil .   Cardiology will be arranging for a follow up appointment and repeat echocardiogram (ultrasound of your heart) in the next few weeks. Please be sure to keep this appointment.   Increase activity slowly   Complete by: As directed       Allergies as of 05/18/2024   No Known Allergies      Medication List     PAUSE taking these medications    lisinopril  10 MG tablet Wait to take this until your doctor or other care provider tells you to start again. Commonly known as: ZESTRIL  Take 1 tablet (10 mg total) by mouth daily.       TAKE these medications    allopurinol  100 MG tablet Commonly known as: ZYLOPRIM  TAKE 2 TABLETS EVERY DAY   amLODipine  2.5 MG tablet Commonly known as: NORVASC  Take 1 tablet (2.5 mg total) by mouth daily. What changed: when to take this   apixaban  2.5 MG Tabs tablet Commonly known as: ELIQUIS  Take 1 tablet (2.5 mg total) by mouth 2 (two) times daily. Start taking on: May 19, 2024 What changed:  medication strength how much to take   co-enzyme Q-10 30 MG capsule Take 30 mg by mouth daily.   colchicine  0.6 MG tablet Take 1 tablet (0.6 mg total) by mouth daily. Start taking on: May 19, 2024   fluticasone  50 MCG/ACT nasal spray Commonly known as: FLONASE  PLACE 1 SPRAY INTO  BOTH NOSTRILS EVERY OTHER DAY. What changed:  when to take this reasons to take this   minocycline  100 MG capsule Commonly known as: MINOCIN  Take 100 mg by mouth every other day.   multivitamin tablet Take 1 tablet by mouth daily.   sildenafil  20 MG tablet Commonly known as: REVATIO  Take 1 tablet (20 mg total) by mouth 3 (three) times daily.   simvastatin  20 MG tablet Commonly known as: ZOCOR  TAKE 1 TABLET EVERY OTHER DAY What changed: additional instructions   tamsulosin  0.4 MG Caps capsule Commonly known as: FLOMAX  TAKE 1 CAPSULE EVERY DAY What changed: when to take this        Follow-up Information     Rana Dixon L, NP Follow up on 06/18/2024.   Specialty: Cardiology Why: Hospital Follow Up Appt Time: 10:05 AM Please arrive 15 min early. Bring all medications. Contact information: [address] [place] [address] [phone]                 Signed: Lamar PARAS Jeananne Bedwell 05/18/2024, 2:28 PM

## 2024-05-19 LAB — ANCA PROFILE
Anti-MPO Antibodies: 0.3 U (ref 0.0–0.9)
Anti-PR3 Antibodies: <0.2 U (ref 0.0–0.9)
Atypical P-ANCA titer: <1:20 {titer}
C-ANCA: <1:20 {titer}
P-ANCA: <1:20 {titer}

## 2024-05-19 LAB — ANTI-SCLERODERMA ANTIBODY: Scleroderma (Scl-70) (ENA) Antibody, IgG: <0.2 AI (ref 0.0–0.9)

## 2024-05-19 LAB — CYTOLOGY - NON PAP

## 2024-05-19 LAB — ANA W/REFLEX IF POSITIVE: Anti Nuclear Antibody (ANA): NEGATIVE

## 2024-05-19 LAB — RHEUMATOID FACTOR: Rheumatoid fact SerPl-aCnc: 20 [IU]/mL — ABNORMAL HIGH

## 2024-05-21 LAB — CULTURE, BODY FLUID W GRAM STAIN -BOTTLE: Culture: NO GROWTH

## 2024-05-25 ENCOUNTER — Other Ambulatory Visit: Payer: Self-pay | Admitting: Family Medicine

## 2024-05-25 DIAGNOSIS — N400 Enlarged prostate without lower urinary tract symptoms: Secondary | ICD-10-CM

## 2024-05-28 ENCOUNTER — Other Ambulatory Visit: Payer: Self-pay | Admitting: Family Medicine

## 2024-06-04 ENCOUNTER — Ambulatory Visit: Payer: Medicare HMO

## 2024-06-04 VITALS — BP 122/62 | HR 68 | Temp 97.6°F | Ht 74.0 in | Wt 244.0 lb

## 2024-06-04 DIAGNOSIS — Z0000 Encounter for general adult medical examination without abnormal findings: Secondary | ICD-10-CM | POA: Diagnosis not present

## 2024-06-04 NOTE — Progress Notes (Signed)
 "  Chief Complaint  Patient presents with   Medicare Wellness     Subjective:   Gary [name] is a 89 y.o. male who presents for a Medicare Annual Wellness Visit.  Visit info / Clinical Intake: Medicare Wellness Visit Type:: Subsequent Annual Wellness Visit Persons participating in visit and providing information:: patient Medicare Wellness Visit Mode:: In-person (required for WTM) Interpreter Needed?: No Pre-visit prep was completed: yes AWV questionnaire completed by patient prior to visit?: no Living arrangements:: (!) lives alone Patient's Overall Health Status Rating: good Typical amount of pain: none Does pain affect daily life?: no Are you currently prescribed opioids?: no  Dietary Habits and Nutritional Risks How many meals a day?: 3 Eats fruit and vegetables daily?: yes Most meals are obtained by: preparing own meals In the last 2 weeks, have you had any of the following?: none Diabetic:: no  Functional Status Activities of Daily Living (to include ambulation/medication): Independent Ambulation: Independent with device- listed below Home Assistive Devices/Equipment: Eyeglasses; CPAP Medication Administration: Independent Home Management (perform basic housework or laundry): Independent Manage your own finances?: yes Primary transportation is: driving Concerns about vision?: no *vision screening is required for WTM* Concerns about hearing?: no  Fall Screening Falls in the past year?: 0 Number of falls in past year: 0 Was there an injury with Fall?: 0 Fall Risk Category Calculator: 0 Patient Fall Risk Level: Low Fall Risk  Fall Risk Patient at Risk for Falls Due to: No Fall Risks Fall risk Follow up: Falls evaluation completed  Home and Transportation Safety: All rugs have non-skid backing?: yes All stairs or steps have railings?: yes Grab bars in the bathtub or shower?: (!) no Have non-skid surface in bathtub or shower?: yes Good home lighting?:  yes Regular seat belt use?: yes Hospital stays in the last year:: (!) yes How many hospital stays:: 1 Reason: Evaluation  Cognitive Assessment Difficulty concentrating, remembering, or making decisions? : no Will 6CIT or Mini Cog be Completed: yes What year is it?: 0 points What month is it?: 0 points Give patient an address phrase to remember (5 components): [address] [place]  About what time is it?: 0 points Count backwards from 20 to 1: 0 points Say the months of the year in reverse: 0 points Repeat the address phrase from earlier: 0 points 6 CIT Score: 0 points  Advance Directives (For Healthcare) Does Patient Have a Medical Advance Directive?: Yes Does patient want to make changes to medical advance directive?: No - Patient declined Type of Advance Directive: Healthcare Power of [place]; Living will Copy of Healthcare Power of Attorney in Chart?: Yes - validated most recent copy scanned in chart (See row information) Copy of Living Will in Chart?: Yes - validated most recent copy scanned in chart (See row information)  Reviewed/Updated  Reviewed/Updated: Reviewed All (Medical, Surgical, Family, Medications, Allergies, Care Teams, Patient Goals)    Allergies (verified) Patient has no known allergies.   Current Medications (verified) Outpatient Encounter Medications as of 06/04/2024  Medication Sig   allopurinol  (ZYLOPRIM ) 100 MG tablet TAKE 2 TABLETS EVERY DAY   amLODipine  (NORVASC ) 2.5 MG tablet Take 1 tablet (2.5 mg total) by mouth daily. (Patient taking differently: Take 2.5 mg by mouth at bedtime.)   apixaban  (ELIQUIS ) 2.5 MG TABS tablet Take 1 tablet (2.5 mg total) by mouth 2 (two) times daily.   co-enzyme Q-10 30 MG capsule Take 30 mg by mouth daily.   colchicine  0.6 MG tablet Take 1 tablet (0.6  mg total) by mouth daily.   fluticasone  (FLONASE ) 50 MCG/ACT nasal spray USE 1 SPRAY IN BOTH NOSTRILS EVERY OTHER DAY.   [Paused] lisinopril  (ZESTRIL ) 10 MG tablet  Take 1 tablet (10 mg total) by mouth daily.   minocycline  (MINOCIN ) 100 MG capsule Take 100 mg by mouth every other day.   Multiple Vitamin (MULTIVITAMIN) tablet Take 1 tablet by mouth daily.   sildenafil  (REVATIO ) 20 MG tablet Take 1 tablet (20 mg total) by mouth 3 (three) times daily.   simvastatin  (ZOCOR ) 20 MG tablet TAKE 1 TABLET EVERY OTHER DAY (Patient taking differently: Take 20 mg by mouth every other day. Every Other Night)   tamsulosin  (FLOMAX ) 0.4 MG CAPS capsule TAKE 1 CAPSULE EVERY DAY   No facility-administered encounter medications on file as of 06/04/2024.    History: Past Medical History:  Diagnosis Date   Adenomatous colon polyp 06/2000   ALLERGIC RHINITIS 10/03/2007   Allergy    seasonal   Arthritis    knees   Atrial flutter (HCC) 06/26/2014   Cancer (HCC)    skin cancers removed in the past basal cell and squamous cell   CARDIAC MURMUR, AORTIC 10/03/2007   Colon polyps    ERECTILE DYSFUNCTION 10/03/2007   High blood pressure 03/2016   HYPERLIPIDEMIA 10/03/2007   Rosacea    Sleep apnea    Past Surgical History:  Procedure Laterality Date   ADENOIDECTOMY     CARDIOVERSION N/A 07/01/2014   Procedure: CARDIOVERSION;  Surgeon: Jerel Balding, MD;  Location: [facility] ENDOSCOPY;  Service: Cardiovascular;  Laterality: N/A;   COLONOSCOPY     PALATE SURGERY     PERICARDIOCENTESIS N/A 05/16/2024   Procedure: PERICARDIOCENTESIS;  Surgeon: Verlin Lonni BIRCH, MD;  Location: [facility] INVASIVE CV LAB;  Service: Cardiovascular;  Laterality: N/A;   RIGHT HEART CATH N/A 09/11/2022   Procedure: RIGHT HEART CATH;  Surgeon: Court Dorn PARAS, MD;  Location: [facility] INVASIVE CV LAB;  Service: Cardiovascular;  Laterality: N/A;   RIGHT HEART CATH N/A 05/16/2024   Procedure: RIGHT HEART CATH;  Surgeon: Verlin Lonni BIRCH, MD;  Location: [facility] INVASIVE CV LAB;  Service: Cardiovascular;  Laterality: N/A;   TEE WITHOUT CARDIOVERSION N/A 07/01/2014   Procedure: TRANSESOPHAGEAL ECHOCARDIOGRAM (TEE);   Surgeon: Jerel Balding, MD;  Location[facility] END[facility]COPY;  Service: Cardiovascular;  Laterality: N/A;   TONSILLECTOMY     TOTAL KNEE ARTHROPLASTY Right 08/27/2017   Procedure: RIGHT TOTAL KNEE ARTHROPLASTY;  Surgeon: Rubie Kemps, MD;  Location: [facility] OR;  Service: Orthopedics;  Laterality: Right;   Family History  Problem Relation Age of Onset   Diabetes Mother    Cancer Father    Liver cancer Father    Hypotension Sister    CAD Brother    Stomach cancer Paternal Aunt    Social History   Occupational History   Occupation: Biochemist, 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    Comment: retired  Tobacco Use   Smoking status: Former   Smokeless tobacco: Never  Vaping Use   Vaping status: Never Used  Substance and Sexual Activity   Alcohol use: Yes    Alcohol/week: 8.0 standard drinks of alcohol    Types: 8 Glasses of wine per week    Comment: ocassionally    Drug use: No   Sexual activity: Not Currently   Tobacco Counseling Counseling given: No  SDOH Screenings   Food Insecurity: No Food Insecurity (06/04/2024)  Housing: Low Risk (06/04/2024)  Transportation Needs: No Transportation Needs (06/04/2024)  Utilities: Not At Risk (06/04/2024)  Alcohol Screen: Low Risk (05/30/2023)  Depression (PHQ2-9): Low Risk (06/04/2024)  Financial Resource Strain: Low Risk (05/30/2023)  Physical Activity: Insufficiently Active (06/04/2024)  Social Connections: Moderately Integrated (06/04/2024)  Stress: No Stress Concern Present (06/04/2024)  Tobacco Use: Medium Risk (06/04/2024)  Health Literacy: Adequate Health Literacy (06/04/2024)   See flowsheets for full screening details  Depression Screen PHQ 2 & 9 Depression Scale- Over the past 2 weeks, how often have you been bothered by any of the following problems? Little interest or pleasure in doing things: 0 Feeling down, depressed, or hopeless (PHQ Adolescent also includes...irritable): 0 PHQ-2 Total Score: 0     Goals Addressed               This Visit's Progress      Increase physical activity (pt-stated)        Remain active and lose weight!             Objective:    Today's Vitals   06/04/24 1030  BP: 122/62  Pulse: 68  Temp: 97.6 F (36.4 C)  TempSrc: Oral  SpO2: 94%  Weight: 244 lb (110.7 kg)  Height: 6' 2 (1.88 m)   Body mass index is 31.33 kg/m.  Hearing/Vision screen Hearing Screening - Comments:: Denies hearing difficulties   Vision Screening - Comments:: Wears rx glasses - up to date with routine eye exams with  Dr Abigail Immunizations and Health Maintenance Health Maintenance  Topic Date Due   COVID-19 Vaccine (3 - Pfizer risk series) 07/27/2019   OPHTHALMOLOGY EXAM  06/28/2024   HEMOGLOBIN A1C  08/26/2024   Medicare Annual Wellness (AWV)  06/04/2025   DTaP/Tdap/Td (3 - Td or Tdap) 05/13/2026   Pneumococcal Vaccine: 50+ Years  Completed   Influenza Vaccine  Completed   Zoster Vaccines- Shingrix   Completed   Meningococcal B Vaccine  Aged Out   FOOT EXAM  Discontinued   Colonoscopy  Discontinued        Assessment/Plan:  This is a routine wellness examination for Viren.  Patient Care Team: Micheal Wolm ORN, MD as PCP - General (Family Medicine) Croitoru, Jerel, MD as PCP - Cardiology (Cardiology) Joshua Blamer, MD as Consulting Physician (Dermatology) Burnard Debby LABOR, MD (Inactive) as Consulting Physician (Cardiology) Raj Rankin SAUNDERS, MD as Referring Physician (Ophthalmology) Lionell Jon DEL, [facility] (Pharmacist)  I have personally reviewed and noted the following in the patients chart:   Medical and social history Use of alcohol, tobacco or illicit drugs  Current medications and supplements including opioid prescriptions. Functional ability and status Nutritional status Physical activity Advanced directives List of other physicians Hospitalizations, surgeries, and ER visits in previous 12 months Vitals Screenings to include cognitive, depression, and falls Referrals and appointments  No orders of the  defined types were placed in this encounter.  In addition, I have reviewed and discussed with patient certain preventive protocols, quality metrics, and best practice recommendations. A written personalized care plan for preventive services as well as general preventive health recommendations were provided to patient.   Rojelio ORN Blush, LPN   11/03/7971   Return in 53 weeks (on 06/10/2025).  After Visit Summary: (In Person-Declined) Patient declined AVS at this time.  Nurse Notes: No voiced or noted concerns at this time "

## 2024-06-04 NOTE — Patient Instructions (Addendum)
 Mr. Pfiester,  Thank you for taking the time for your Medicare Wellness Visit. I appreciate your continued commitment to your health goals. Please review the care plan we discussed, and feel free to reach out if I can assist you further.  Please note that Annual Wellness Visits do not include a physical exam. Some assessments may be limited, especially if the visit was conducted virtually. If needed, we may recommend an in-person follow-up with your provider.  Ongoing Care Seeing your primary care provider every 3 to 6 months helps us  monitor your health and provide consistent, personalized care.   Referrals If a referral was made during today's visit and you haven't received any updates within two weeks, please contact the referred provider directly to check on the status.  Recommended Screenings:  Health Maintenance  Topic Date Due   COVID-19 Vaccine (3 - Pfizer risk series) 07/27/2019   Eye exam for diabetics  06/28/2024   Hemoglobin A1C  08/26/2024   Medicare Annual Wellness Visit  06/04/2025   DTaP/Tdap/Td vaccine (3 - Td or Tdap) 05/13/2026   Pneumococcal Vaccine for age over 26  Completed   Flu Shot  Completed   Zoster (Shingles) Vaccine  Completed   Meningitis B Vaccine  Aged Out   Complete foot exam   Discontinued   Colon Cancer Screening  Discontinued       06/04/2024   10:37 AM  Advanced Directives  Does Patient Have a Medical Advance Directive? Yes  Type of Estate Agent of [place];Living will  Does patient want to make changes to medical advance directive? No - Patient declined  Copy of Healthcare Power of Attorney in Chart? Yes - validated most recent copy scanned in chart (See row information)    Vision: Annual vision screenings are recommended for early detection of glaucoma, cataracts, and diabetic retinopathy. These exams can also reveal signs of chronic conditions such as diabetes and high blood pressure.  Dental: Annual dental screenings  help detect early signs of oral cancer, gum disease, and other conditions linked to overall health, including heart disease and diabetes.  Please see the attached documents for additional preventive care recommendations.

## 2024-06-17 ENCOUNTER — Ambulatory Visit (HOSPITAL_COMMUNITY)

## 2024-06-18 ENCOUNTER — Ambulatory Visit: Admitting: Emergency Medicine

## 2025-02-09 ENCOUNTER — Encounter: Admitting: Family Medicine

## 2025-06-10 ENCOUNTER — Ambulatory Visit
